# Patient Record
Sex: Female | Born: 1960 | Hispanic: Yes | Marital: Single | Smoking: Former smoker
Health system: Southern US, Community
[De-identification: ages and names within clinical notes are randomized; demographics above are authoritative.]

## PROBLEM LIST (undated history)

## (undated) DIAGNOSIS — I639 Cerebral infarction, unspecified: Secondary | ICD-10-CM

## (undated) DIAGNOSIS — J45909 Unspecified asthma, uncomplicated: Secondary | ICD-10-CM

## (undated) DIAGNOSIS — E119 Type 2 diabetes mellitus without complications: Secondary | ICD-10-CM

## (undated) DIAGNOSIS — F0390 Unspecified dementia without behavioral disturbance: Secondary | ICD-10-CM

## (undated) HISTORY — PX: NO PAST SURGERIES: SHX2092

---

## 2017-09-01 ENCOUNTER — Emergency Department (HOSPITAL_COMMUNITY): Payer: Medicare (Managed Care)

## 2017-09-01 ENCOUNTER — Inpatient Hospital Stay (HOSPITAL_COMMUNITY)
Admission: EM | Admit: 2017-09-01 | Discharge: 2017-09-02 | DRG: 313 | Disposition: A | Discharge status: Home / Self Care | Payer: Medicare (Managed Care) | Attending: Cardiovascular Disease | Admitting: Cardiovascular Disease

## 2017-09-01 ENCOUNTER — Encounter (HOSPITAL_COMMUNITY): Payer: Self-pay | Admitting: Emergency Medicine

## 2017-09-01 DIAGNOSIS — I69354 Hemiplegia and hemiparesis following cerebral infarction affecting left non-dominant side: Secondary | ICD-10-CM | POA: Diagnosis not present

## 2017-09-01 DIAGNOSIS — Z794 Long term (current) use of insulin: Secondary | ICD-10-CM | POA: Diagnosis not present

## 2017-09-01 DIAGNOSIS — Z23 Encounter for immunization: Secondary | ICD-10-CM | POA: Diagnosis present

## 2017-09-01 DIAGNOSIS — R072 Precordial pain: Secondary | ICD-10-CM | POA: Diagnosis not present

## 2017-09-01 DIAGNOSIS — E785 Hyperlipidemia, unspecified: Secondary | ICD-10-CM | POA: Diagnosis present

## 2017-09-01 DIAGNOSIS — R7989 Other specified abnormal findings of blood chemistry: Secondary | ICD-10-CM

## 2017-09-01 DIAGNOSIS — J45909 Unspecified asthma, uncomplicated: Secondary | ICD-10-CM | POA: Diagnosis present

## 2017-09-01 DIAGNOSIS — I10 Essential (primary) hypertension: Secondary | ICD-10-CM | POA: Diagnosis present

## 2017-09-01 DIAGNOSIS — R739 Hyperglycemia, unspecified: Secondary | ICD-10-CM

## 2017-09-01 DIAGNOSIS — R778 Other specified abnormalities of plasma proteins: Secondary | ICD-10-CM

## 2017-09-01 DIAGNOSIS — R079 Chest pain, unspecified: Principal | ICD-10-CM | POA: Diagnosis present

## 2017-09-01 DIAGNOSIS — I214 Non-ST elevation (NSTEMI) myocardial infarction: Secondary | ICD-10-CM | POA: Diagnosis not present

## 2017-09-01 DIAGNOSIS — E7800 Pure hypercholesterolemia, unspecified: Secondary | ICD-10-CM | POA: Diagnosis not present

## 2017-09-01 DIAGNOSIS — E119 Type 2 diabetes mellitus without complications: Secondary | ICD-10-CM

## 2017-09-01 DIAGNOSIS — Z9114 Patient's other noncompliance with medication regimen: Secondary | ICD-10-CM | POA: Diagnosis not present

## 2017-09-01 DIAGNOSIS — Z8673 Personal history of transient ischemic attack (TIA), and cerebral infarction without residual deficits: Secondary | ICD-10-CM

## 2017-09-01 DIAGNOSIS — E118 Type 2 diabetes mellitus with unspecified complications: Secondary | ICD-10-CM

## 2017-09-01 DIAGNOSIS — E781 Pure hyperglyceridemia: Secondary | ICD-10-CM | POA: Diagnosis present

## 2017-09-01 DIAGNOSIS — R0602 Shortness of breath: Secondary | ICD-10-CM | POA: Diagnosis not present

## 2017-09-01 DIAGNOSIS — E1165 Type 2 diabetes mellitus with hyperglycemia: Secondary | ICD-10-CM | POA: Diagnosis present

## 2017-09-01 HISTORY — DX: Type 2 diabetes mellitus without complications: E11.9

## 2017-09-01 HISTORY — DX: Unspecified asthma, uncomplicated: J45.909

## 2017-09-01 HISTORY — DX: Cerebral infarction, unspecified: I63.9

## 2017-09-01 LAB — COMPREHENSIVE METABOLIC PANEL
ALBUMIN: 3.7 g/dL (ref 3.5–5.0)
ALK PHOS: 85 U/L (ref 38–126)
ALT: 27 U/L (ref 14–54)
ANION GAP: 9 (ref 5–15)
AST: 21 U/L (ref 15–41)
BILIRUBIN TOTAL: 0.9 mg/dL (ref 0.3–1.2)
BUN: 10 mg/dL (ref 6–20)
CALCIUM: 9 mg/dL (ref 8.9–10.3)
CO2: 27 mmol/L (ref 22–32)
CREATININE: 0.53 mg/dL (ref 0.44–1.00)
Chloride: 99 mmol/L — ABNORMAL LOW (ref 101–111)
GFR calc Af Amer: >60 mL/min (ref >60)
GFR calc non Af Amer: >60 mL/min (ref >60)
GLUCOSE: 345 mg/dL — AB (ref 65–99)
Potassium: 4.1 mmol/L (ref 3.5–5.1)
SODIUM: 135 mmol/L (ref 135–145)
TOTAL PROTEIN: 7 g/dL (ref 6.5–8.1)

## 2017-09-01 LAB — CBC WITH DIFFERENTIAL/PLATELET
BASOS ABS: 0 10*3/uL (ref 0.0–0.1)
BASOS PCT: 0 %
EOS PCT: 2 %
Eosinophils Absolute: 0.2 10*3/uL (ref 0.0–0.7)
HCT: 43.4 % (ref 36.0–46.0)
Hemoglobin: 14.7 g/dL (ref 12.0–15.0)
Lymphocytes Relative: 31 %
Lymphs Abs: 2.2 10*3/uL (ref 0.7–4.0)
MCH: 31 pg (ref 26.0–34.0)
MCHC: 33.9 g/dL (ref 30.0–36.0)
MCV: 91.6 fL (ref 78.0–100.0)
MONO ABS: 0.5 10*3/uL (ref 0.1–1.0)
MONOS PCT: 7 %
NEUTROS ABS: 4.1 10*3/uL (ref 1.7–7.7)
Neutrophils Relative %: 60 %
PLATELETS: 275 10*3/uL (ref 150–400)
RBC: 4.74 MIL/uL (ref 3.87–5.11)
RDW: 12.6 % (ref 11.5–15.5)
WBC: 7 10*3/uL (ref 4.0–10.5)

## 2017-09-01 LAB — GLUCOSE, CAPILLARY: GLUCOSE-CAPILLARY: 404 mg/dL — AB (ref 65–99)

## 2017-09-01 LAB — TROPONIN I: Troponin I: <0.03 ng/mL (ref <0.03)

## 2017-09-01 MED ORDER — ALBUTEROL SULFATE HFA 108 (90 BASE) MCG/ACT IN AERS: 2.0000 | INHALATION_SPRAY | Freq: Four times a day (QID) | RESPIRATORY_TRACT | Status: DC | PRN

## 2017-09-01 MED ORDER — ACETAMINOPHEN 325 MG PO TABS: 325.0000 mg | ORAL_TABLET | Freq: Four times a day (QID) | ORAL | Status: DC | PRN

## 2017-09-01 MED ORDER — HEPARIN (PORCINE) IN NACL 100-0.45 UNIT/ML-% IJ SOLN
1100.0000 [IU]/h | INTRAMUSCULAR | Status: DC
  Administered 2017-09-01: 800 [IU]/h via INTRAVENOUS
  Filled 2017-09-01: qty 250

## 2017-09-01 MED ORDER — ASPIRIN EC 81 MG PO TBEC
81.0000 mg | DELAYED_RELEASE_TABLET | Freq: Every day | ORAL | Status: DC
  Administered 2017-09-02: 81 mg via ORAL
  Filled 2017-09-01: qty 1

## 2017-09-01 MED ORDER — ALBUTEROL SULFATE (2.5 MG/3ML) 0.083% IN NEBU: 2.5000 mg | INHALATION_SOLUTION | RESPIRATORY_TRACT | Status: DC | PRN

## 2017-09-01 MED ORDER — INSULIN ASPART 100 UNIT/ML ~~LOC~~ SOLN
0.0000 [IU] | Freq: Three times a day (TID) | SUBCUTANEOUS | Status: DC
  Administered 2017-09-02 (×3): 5 [IU] via SUBCUTANEOUS

## 2017-09-01 MED ORDER — INSULIN ASPART 100 UNIT/ML ~~LOC~~ SOLN
0.0000 [IU] | Freq: Every day | SUBCUTANEOUS | Status: DC
  Administered 2017-09-01: 5 [IU] via SUBCUTANEOUS

## 2017-09-01 MED ORDER — ATORVASTATIN CALCIUM 80 MG PO TABS
80.0000 mg | ORAL_TABLET | Freq: Every day | ORAL | Status: DC
  Administered 2017-09-02: 80 mg via ORAL
  Filled 2017-09-01: qty 1

## 2017-09-01 MED ORDER — ASPIRIN 81 MG PO CHEW
324.0000 mg | CHEWABLE_TABLET | Freq: Once | ORAL | Status: AC
  Administered 2017-09-01: 324 mg via ORAL
  Filled 2017-09-01: qty 4

## 2017-09-01 MED ORDER — SODIUM CHLORIDE 0.9 % IV BOLUS (SEPSIS)
1000.0000 mL | Freq: Once | INTRAVENOUS | Status: AC
  Administered 2017-09-01: 1000 mL via INTRAVENOUS

## 2017-09-01 MED ORDER — CITALOPRAM HYDROBROMIDE 20 MG PO TABS
40.0000 mg | ORAL_TABLET | Freq: Every day | ORAL | Status: DC
  Administered 2017-09-01 – 2017-09-02 (×2): 40 mg via ORAL
  Filled 2017-09-01 (×2): qty 2

## 2017-09-01 MED ORDER — IRBESARTAN 150 MG PO TABS
150.0000 mg | ORAL_TABLET | Freq: Every day | ORAL | Status: DC
  Administered 2017-09-02: 150 mg via ORAL
  Filled 2017-09-01: qty 1

## 2017-09-01 MED ORDER — VALSARTAN-HYDROCHLOROTHIAZIDE 160-12.5 MG PO TABS: 1.0000 | ORAL_TABLET | Freq: Every day | ORAL | Status: DC

## 2017-09-01 MED ORDER — INSULIN GLARGINE 100 UNIT/ML ~~LOC~~ SOLN
50.0000 [IU] | Freq: Two times a day (BID) | SUBCUTANEOUS | Status: DC
  Administered 2017-09-02 (×2): 50 [IU] via SUBCUTANEOUS
  Filled 2017-09-01 (×4): qty 0.5

## 2017-09-01 MED ORDER — HEPARIN BOLUS VIA INFUSION
4000.0000 [IU] | Freq: Once | INTRAVENOUS | Status: AC
  Administered 2017-09-01: 4000 [IU] via INTRAVENOUS
  Filled 2017-09-01: qty 4000

## 2017-09-01 MED ORDER — ONDANSETRON HCL 4 MG/2ML IJ SOLN: 4.0000 mg | Freq: Four times a day (QID) | INTRAMUSCULAR | Status: DC | PRN

## 2017-09-01 MED ORDER — HYDROCHLOROTHIAZIDE 12.5 MG PO CAPS
12.5000 mg | ORAL_CAPSULE | Freq: Every day | ORAL | Status: DC
  Administered 2017-09-02: 12.5 mg via ORAL
  Filled 2017-09-01: qty 1

## 2017-09-01 MED ORDER — ACETAMINOPHEN 325 MG PO TABS: 650.0000 mg | ORAL_TABLET | ORAL | Status: DC | PRN

## 2017-09-01 MED ORDER — NITROGLYCERIN 0.4 MG SL SUBL: 0.4000 mg | SUBLINGUAL_TABLET | SUBLINGUAL | Status: DC | PRN

## 2017-09-01 MED ORDER — METOPROLOL TARTRATE 12.5 MG HALF TABLET
12.5000 mg | ORAL_TABLET | Freq: Two times a day (BID) | ORAL | Status: DC
  Administered 2017-09-01 – 2017-09-02 (×2): 12.5 mg via ORAL
  Filled 2017-09-01 (×2): qty 1

## 2017-09-01 MED ORDER — INSULIN ASPART 100 UNIT/ML ~~LOC~~ SOLN
8.0000 [IU] | Freq: Once | SUBCUTANEOUS | Status: AC
  Administered 2017-09-01: 8 [IU] via INTRAVENOUS
  Filled 2017-09-01: qty 1

## 2017-09-01 NOTE — H&P (Signed)
Cardiology History & Physical    Patient ID: Christina Evans MRN: [id number], DOB: February 07, 1961 Date of Encounter: 09/01/2017, 8:58 PM Primary Physician: Patient, No Pcp Per  Chief Complaint: Chest pain   HPI: Christina Evans is a 56 y.o. female with history of DM2, HTN, prior stroke, asthma, who presents with CP.  Pt has had intermittent SSCP, described as pressure, over the past 3 days.  This was associated with SOB, and not necessarily related to exertion.  Given persistence and increasing frequency of these CP episodes, she presented to the ED today.    In the ED, initial ECG was unremarkable, but labs showed troponin 0.21. She was Cp free on my interview.  On ROS, she does note an episode of syncope 2-3 weeks prior, but denies any episodes of presyncope or syncope since then.  She denies lower extremity edema, palpitations, or neurologic symptoms.  Past Medical History:  Diagnosis Date  . Asthma   . CVA (cerebral vascular accident) (HCC)   . Diabetes mellitus without complication [facility])      Surgical History: No past surgical history on file.   Home Meds: Prior to Admission medications   Medication Sig Start Date End Date Taking? Authorizing Provider  acetaminophen (TYLENOL) 325 MG tablet Take 325-650 mg by mouth every 6 (six) hours as needed (for headaches or pain).   Yes [provider]  albuterol (PROAIR HFA) 108 (90 Base) MCG/ACT inhaler Inhale 2 puffs into the lungs 4 (four) times daily as needed for wheezing or shortness of breath.    Yes [provider]  citalopram (CELEXA) 40 MG tablet Take 40 mg by mouth daily.   Yes [provider]  insulin aspart (NOVOLOG FLEXPEN) 100 UNIT/ML FlexPen Inject 15 Units into the skin 3 (three) times daily with meals.   Yes [provider]  Insulin Glargine (LANTUS SOLOSTAR) 100 UNIT/ML Solostar Pen Inject 50 Units into the skin 2 (two) times daily.   Yes [provider]  metFORMIN  (GLUCOPHAGE) 500 MG tablet Take 500 mg by mouth 2 (two) times daily with a meal.   Yes [provider]  valsartan-hydrochlorothiazide (DIOVAN-HCT) 160-12.5 MG tablet Take 1 tablet by mouth daily.   Yes [provider]    Allergies: No Known Allergies  Social History   Social History  . Marital status: Single    Spouse name: N/A  . Number of children: N/A  . Years of education: N/A   Occupational History  . Not on file.   Social History Main Topics  . Smoking status: Not on file  . Smokeless tobacco: Not on file  . Alcohol use Not on file  . Drug use: Unknown  . Sexual activity: Not on file   Other Topics Concern  . Not on file   Social History Narrative  . No narrative on file     No family history on file.  Review of Systems: All other systems reviewed and are otherwise negative except as noted above.  Labs:  Lab Results  Component Value Date   WBC 7.0 09/01/2017   HGB 14.7 09/01/2017   HCT 43.4 09/01/2017   MCV 91.6 09/01/2017   PLT 275 09/01/2017    Recent Labs Lab 09/01/17 1149  NA 135  K 4.1  CL 99*  CO2 27  BUN 10  CREATININE 0.53  CALCIUM 9.0  PROT 7.0  BILITOT 0.9  ALKPHOS 85  ALT 27  AST 21  GLUCOSE 345*  Recent Labs  09/01/17 1810  TROPONINI 0.21*   No results found for: CHOL, HDL, LDLCALC, TRIG No results found for: DDIMER  Radiology/Studies:  Dg Chest 2 View  Result Date: 09/01/2017 CLINICAL DATA:  Chest pain. EXAM: CHEST  2 VIEW COMPARISON:  None. FINDINGS: The heart size and mediastinal contours are within normal limits. Both lungs are clear. The visualized skeletal structures are unremarkable. IMPRESSION: Negative two view chest x-ray Electronically Signed   By: Marin Roberts M.D.   On: 09/01/2017 16:17   Wt Readings from Last 3 Encounters:  No data found for Wt    EKG: NSR, NS STTW changes.  Physical Exam: Blood pressure 110/76, pulse 84, temperature 99.1 F (37.3 C), temperature source  Oral, resp. rate 16, SpO2 100 %. There is no height or weight on file to calculate BMI. General: Well developed, well nourished, in no acute distress. Head: Normocephalic, atraumatic, sclera non-icteric, no xanthomas, nares are without discharge.  Neck: Negative for carotid bruits. JVD not elevated. Lungs: Clear bilaterally to auscultation without wheezes, rales, or rhonchi. Breathing is unlabored. Heart: RRR with S1 S2. No murmurs, rubs, or gallops appreciated. Abdomen: Soft, non-tender, non-distended with normoactive bowel sounds. No hepatomegaly. No rebound/guarding. No obvious abdominal masses. Msk:  Strength and tone appear normal for age. Extremities: No clubbing or cyanosis. No edema.  Distal pedal pulses are 2+ and equal bilaterally. Neuro: Alert and oriented X 3. No focal deficit. No facial asymmetry. Moves all extremities spontaneously. Psych:  Responds to questions appropriately with a normal affect.    Assessment and Plan  56 y.o. female with history of DM2, HTN, prior stroke, asthma, who presents with CP, found to have NSTEMI.  1. NSTEMI:  Concerning for plaque rupture event. Will start heparin gtt and keep NPO in anticipation of cath tomorrow.  Will continue low dose ASA, start low dose metoprolol and high intensity atorvastatin.  TTE ordered.  2.  HTN:  Continue home HCTZ-Valsartan, added low dose metoprolol as aforementioned.  3.  DM2:  Hyperglycemic on presentation.  Continue home Lantus and AISS.  Holding metformin in anticipation of contrast load.  4.  Asthma: Continue PRN albuterol.  No evidence of bronchospasm on exam.  Signed, Esmond Plants, MD 09/01/2017, 8:58 PM

## 2017-09-01 NOTE — ED Triage Notes (Signed)
The pt just returned from xray 

## 2017-09-01 NOTE — ED Notes (Signed)
Pt went to x-ray.

## 2017-09-01 NOTE — Progress Notes (Signed)
ANTICOAGULATION CONSULT NOTE - Initial Consult  Pharmacy Consult for heparin dosing Indication: chest pain/ACS  No Known Allergies  Patient Measurements:   Heparin Dosing Weight: 64.84  Vital Signs: Temp: 99.1 F (37.3 C) (10/10 1146) Temp Source: Oral (10/10 1146) BP: 110/76 (10/10 2000) Pulse Rate: 84 (10/10 2000)  Labs:  Recent Labs  09/01/17 1149 09/01/17 1810  HGB 14.7  --   HCT 43.4  --   PLT 275  --   CREATININE 0.53  --   TROPONINI  --  0.21*    CrCl cannot be calculated (Unknown ideal weight.).   Medical History: Past Medical History:  Diagnosis Date  . Asthma   . CVA (cerebral vascular accident) (HCC)   . Diabetes mellitus without complication (HCC)     Assessment: 55 YOF admitted with chest pain. HGB 14.7, Hct 43.4, Plt 275.  Troponin 0.21. History of stroke.  No anticoag PTA. No bleeding reported.   Goal of Therapy:  Heparin level 0.3-0.7 units/ml Monitor platelets by anticoagulation protocol: Yes   Plan:  Give 4000 unit IV bolus x 1 Start heparin infusion at 800 units/hr. 6 hour heparin level, daily heparin level and CBC Monitor s/sx of bleeding.   Mariam Helbert 09/01/2017,9:17 PM

## 2017-09-01 NOTE — ED Notes (Signed)
No pain c/o sob  None at present  sats 100 % on room air

## 2017-09-01 NOTE — ED Triage Notes (Signed)
Pt states she woke up this am with left sided facial swelling in her cheek, pt denies any pain, pt has dentures and no teeth or carries. Pt has history of stroke with left side paralysis.

## 2017-09-01 NOTE — ED Provider Notes (Addendum)
[facility]-EMERGENCY DEPT Provider Note   CSN: [id number] Arrival date & time: 09/01/17  1120     History   Chief Complaint Chief Complaint  Patient presents with  . Chest Pain    HPI Christina Evans is a 56 y.o. female.  Patient with hx prior cva 2008 with residual left sided weakness, c/o mid chest pain and mild sob for the past 3 days. Pt is difficult historian - she indicates since she had a prior stroke she doesn't want to get a new stroke.  Pt denies any new numbness/weakness. No change in vision or speech. No acute change or new loss in any functional ability. Cp occurs at rest. Constant, dull. Mild, non radiating. Occurs at rest. No relation to exertion. It is not pleuritic. Denies chest wall strain. No heartburn. Denies leg pain or swelling. No recent surgery, immobility, trauma or travel. Denies cough or uri c/o. No fever or chills.    The history is provided by the patient.    Past Medical History:  Diagnosis Date  . Asthma   . CVA (cerebral vascular accident) (HCC)   . Diabetes mellitus without complication (HCC)     There are no active problems to display for this patient.   No past surgical history on file.  OB History    No data available       Home Medications    Prior to Admission medications   Not on File    Family History No family history on file.  Social History Social History  Substance Use Topics  . Smoking status: Not on file  . Smokeless tobacco: Not on file  . Alcohol use Not on file     Allergies   Patient has no known allergies.   Review of Systems Review of Systems  Constitutional: Negative for chills and fever.  HENT: Negative for sore throat.   Eyes: Negative for redness.  Respiratory: Positive for shortness of breath.   Cardiovascular: Positive for chest pain.  Gastrointestinal: Negative for abdominal pain.  Genitourinary: Negative for flank pain.  Musculoskeletal: Negative for back pain and neck pain.  Skin:  Negative for rash.  Neurological: Negative for weakness, numbness and headaches.  Hematological: Does not bruise/bleed easily.  Psychiatric/Behavioral: Negative for confusion.     Physical Exam Updated Vital Signs BP 123/68 (BP Location: Right Arm)   Pulse 94   Temp 99.1 F (37.3 C) (Oral)   Resp 16   SpO2 96%   Physical Exam  Constitutional: She is oriented to person, place, and time. She appears well-developed and well-nourished. No distress.  HENT:  Head: Atraumatic.  Mouth/Throat: Oropharynx is clear and moist.  Eyes: Pupils are equal, round, and reactive to light. Conjunctivae and EOM are normal. No scleral icterus.  Neck: Neck supple. No tracheal deviation present. No thyromegaly present.  No bruits  Cardiovascular: Normal rate, regular rhythm, normal heart sounds and intact distal pulses.  Exam reveals no gallop and no friction rub.   No murmur heard. Pulmonary/Chest: Effort normal and breath sounds normal. No respiratory distress. She exhibits no tenderness.  Abdominal: Soft. Normal appearance and bowel sounds are normal. She exhibits no distension. There is no tenderness.  Musculoskeletal: She exhibits no edema or tenderness.  Neurological: She is alert and oriented to person, place, and time.  No facial droop. Cr n intact. No pronator drift on right.  L hemiparesis (c/w baseline per pt). Right motor 5/5.   Skin: Skin is warm and dry. No rash noted.  She is not diaphoretic.  Psychiatric: She has a normal mood and affect.  Nursing note and vitals reviewed.    ED Treatments / Results  Labs (all labs ordered are listed, but only abnormal results are displayed) Results for orders placed or performed during the hospital encounter of 09/01/17  Comprehensive metabolic panel  Result Value Ref Range   Sodium 135 135 - 145 mmol/L   Potassium 4.1 3.5 - 5.1 mmol/L   Chloride 99 (L) 101 - 111 mmol/L   CO2 27 22 - 32 mmol/L   Glucose, Bld 345 (H) 65 - 99 mg/dL   BUN 10 6 -  20 mg/dL   Creatinine, Ser 1.61 0.44 - 1.00 mg/dL   Calcium 9.0 8.9 - 09.6 mg/dL   Total Protein 7.0 6.5 - 8.1 g/dL   Albumin 3.7 3.5 - 5.0 g/dL   AST 21 15 - 41 U/L   ALT 27 14 - 54 U/L   Alkaline Phosphatase 85 38 - 126 U/L   Total Bilirubin 0.9 0.3 - 1.2 mg/dL   GFR calc non Af Amer >60 >60 mL/min   GFR calc Af Amer >60 >60 mL/min   Anion gap 9 5 - 15  CBC with Differential  Result Value Ref Range   WBC 7.0 4.0 - 10.5 K/uL   RBC 4.74 3.87 - 5.11 MIL/uL   Hemoglobin 14.7 12.0 - 15.0 g/dL   HCT 04.5 40.9 - 81.1 %   MCV 91.6 78.0 - 100.0 fL   MCH 31.0 26.0 - 34.0 pg   MCHC 33.9 30.0 - 36.0 g/dL   RDW 91.4 78.2 - 95.6 %   Platelets 275 150 - 400 K/uL   Neutrophils Relative % 60 %   Neutro Abs 4.1 1.7 - 7.7 K/uL   Lymphocytes Relative 31 %   Lymphs Abs 2.2 0.7 - 4.0 K/uL   Monocytes Relative 7 %   Monocytes Absolute 0.5 0.1 - 1.0 K/uL   Eosinophils Relative 2 %   Eosinophils Absolute 0.2 0.0 - 0.7 K/uL   Basophils Relative 0 %   Basophils Absolute 0.0 0.0 - 0.1 K/uL  Troponin I  Result Value Ref Range   Troponin I 0.21 (HH) <0.03 ng/mL   EKG  EKG Interpretation None       Radiology Dg Chest 2 View  Result Date: 09/01/2017 CLINICAL DATA:  Chest pain. EXAM: CHEST  2 VIEW COMPARISON:  None. FINDINGS: The heart size and mediastinal contours are within normal limits. Both lungs are clear. The visualized skeletal structures are unremarkable. IMPRESSION: Negative two view chest x-ray Electronically Signed   By: Marin Roberts M.D.   On: 09/01/2017 16:17    Procedures Procedures (including critical care time)  Medications Ordered in ED Medications - No data to display   Initial Impression / Assessment and Plan / ED Course  I have reviewed the triage vital signs and the nursing notes.  Pertinent labs & imaging results that were available during my care of the patient were reviewed by me and considered in my medical decision making (see chart for details).  Iv  ns bolus. Labs.   Reviewed nursing notes and prior charts for additional history.   Glucose is elevated. Pt initially denies hx diab, but chart/meds indicate otherwise.  Iv ns bolus. novolog sq.  Awaiting lab results.  Trop returns high.  No current chest pain. Asa po.  Cardiology consulted for admission re recent cp/sob, and elevated trop.  Pt also w hyperglycemia.  Discussed w  cardiology - they will admit.    Final Clinical Impressions(s) / ED Diagnoses   Final diagnoses:  None    New Prescriptions New Prescriptions   No medications on file          Cathren Laine, MD 09/01/17 2020

## 2017-09-01 NOTE — ED Notes (Signed)
I went in to start an iv on this pt  She does not want the iv until she knows why she ios receiving it.  Dr Denton Lank made aware

## 2017-09-02 ENCOUNTER — Inpatient Hospital Stay (HOSPITAL_COMMUNITY): Payer: Medicare (Managed Care)

## 2017-09-02 ENCOUNTER — Encounter (HOSPITAL_COMMUNITY): Payer: Self-pay

## 2017-09-02 DIAGNOSIS — R072 Precordial pain: Secondary | ICD-10-CM

## 2017-09-02 DIAGNOSIS — E7800 Pure hypercholesterolemia, unspecified: Secondary | ICD-10-CM

## 2017-09-02 DIAGNOSIS — I10 Essential (primary) hypertension: Secondary | ICD-10-CM

## 2017-09-02 DIAGNOSIS — R0602 Shortness of breath: Secondary | ICD-10-CM

## 2017-09-02 DIAGNOSIS — R079 Chest pain, unspecified: Secondary | ICD-10-CM

## 2017-09-02 DIAGNOSIS — E785 Hyperlipidemia, unspecified: Secondary | ICD-10-CM

## 2017-09-02 DIAGNOSIS — E119 Type 2 diabetes mellitus without complications: Secondary | ICD-10-CM

## 2017-09-02 DIAGNOSIS — I214 Non-ST elevation (NSTEMI) myocardial infarction: Secondary | ICD-10-CM

## 2017-09-02 DIAGNOSIS — E118 Type 2 diabetes mellitus with unspecified complications: Secondary | ICD-10-CM

## 2017-09-02 DIAGNOSIS — Z794 Long term (current) use of insulin: Secondary | ICD-10-CM

## 2017-09-02 LAB — GLUCOSE, CAPILLARY
Glucose-Capillary: 228 mg/dL — ABNORMAL HIGH (ref 65–99)
Glucose-Capillary: 258 mg/dL — ABNORMAL HIGH (ref 65–99)
Glucose-Capillary: 298 mg/dL — ABNORMAL HIGH (ref 65–99)
Glucose-Capillary: 360 mg/dL — ABNORMAL HIGH (ref 65–99)
Glucose-Capillary: 263 mg/dL — ABNORMAL HIGH (ref 65–99)

## 2017-09-02 LAB — LIPID PANEL
Cholesterol: 262 mg/dL — ABNORMAL HIGH (ref 0–200)
HDL: 36 mg/dL — ABNORMAL LOW (ref >40)
LDL Cholesterol: UNDETERMINED mg/dL (ref 0–99)
Total CHOL/HDL Ratio: 7.3 RATIO
Triglycerides: 727 mg/dL — ABNORMAL HIGH (ref <150)
VLDL: UNDETERMINED mg/dL (ref 0–40)

## 2017-09-02 LAB — HIV ANTIBODY (ROUTINE TESTING W REFLEX): HIV Screen 4th Generation wRfx: NONREACTIVE

## 2017-09-02 LAB — BASIC METABOLIC PANEL
Anion gap: 10 (ref 5–15)
BUN: 10 mg/dL (ref 6–20)
CO2: 23 mmol/L (ref 22–32)
Calcium: 8.7 mg/dL — ABNORMAL LOW (ref 8.9–10.3)
Chloride: 101 mmol/L (ref 101–111)
Creatinine, Ser: 0.51 mg/dL (ref 0.44–1.00)
GFR calc Af Amer: >60 mL/min (ref >60)
GFR calc non Af Amer: >60 mL/min (ref >60)
Glucose, Bld: 223 mg/dL — ABNORMAL HIGH (ref 65–99)
Potassium: 3.5 mmol/L (ref 3.5–5.1)
Sodium: 134 mmol/L — ABNORMAL LOW (ref 135–145)

## 2017-09-02 LAB — NM MYOCAR MULTI W/SPECT W/WALL MOTION / EF
CSEPEDS: 23 s
CSEPEW: 1 METS
CSEPPHR: 96 {beats}/min
Exercise duration (min): 5 min
Rest HR: 78 {beats}/min

## 2017-09-02 LAB — CBC
HCT: 41.8 % (ref 36.0–46.0)
HEMATOCRIT: 43.3 % (ref 36.0–46.0)
Hemoglobin: 14.4 g/dL (ref 12.0–15.0)
Hemoglobin: 14.5 g/dL (ref 12.0–15.0)
MCH: 31.6 pg (ref 26.0–34.0)
MCH: 30.9 pg (ref 26.0–34.0)
MCHC: 34.4 g/dL (ref 30.0–36.0)
MCHC: 33.5 g/dL (ref 30.0–36.0)
MCV: 91.7 fL (ref 78.0–100.0)
MCV: 92.3 fL (ref 78.0–100.0)
Platelets: 277 10*3/uL (ref 150–400)
Platelets: 312 10*3/uL (ref 150–400)
RBC: 4.56 MIL/uL (ref 3.87–5.11)
RBC: 4.69 MIL/uL (ref 3.87–5.11)
RDW: 12.5 % (ref 11.5–15.5)
RDW: 12.7 % (ref 11.5–15.5)
WBC: 8.8 10*3/uL (ref 4.0–10.5)
WBC: 8 10*3/uL (ref 4.0–10.5)

## 2017-09-02 LAB — MRSA PCR SCREENING: MRSA BY PCR: NEGATIVE

## 2017-09-02 LAB — TROPONIN I: Troponin I: <0.03 ng/mL (ref <0.03)

## 2017-09-02 LAB — HEPARIN LEVEL (UNFRACTIONATED): Heparin Unfractionated: <0.1 IU/mL — ABNORMAL LOW (ref 0.30–0.70)

## 2017-09-02 LAB — HEMOGLOBIN A1C
HEMOGLOBIN A1C: 12.7 % — AB (ref 4.8–5.6)
Mean Plasma Glucose: 317.79 mg/dL

## 2017-09-02 MED ORDER — ATORVASTATIN CALCIUM 80 MG PO TABS: 80.0000 mg | ORAL_TABLET | Freq: Every day | ORAL | 0 refills | Status: DC

## 2017-09-02 MED ORDER — BLOOD GLUCOSE METER KIT: PACK | 0 refills | Status: AC

## 2017-09-02 MED ORDER — OMEGA-3-ACID ETHYL ESTERS 1 G PO CAPS
1.0000 g | ORAL_CAPSULE | Freq: Two times a day (BID) | ORAL | Status: DC
  Administered 2017-09-02: 1 g via ORAL
  Filled 2017-09-02: qty 1

## 2017-09-02 MED ORDER — VALSARTAN-HYDROCHLOROTHIAZIDE 160-12.5 MG PO TABS: 1.0000 | ORAL_TABLET | Freq: Every day | ORAL | 1 refills | Status: DC

## 2017-09-02 MED ORDER — TECHNETIUM TC 99M TETROFOSMIN IV KIT
30.0000 | PACK | Freq: Once | INTRAVENOUS | Status: AC | PRN
  Administered 2017-09-02: 30 via INTRAVENOUS

## 2017-09-02 MED ORDER — ENOXAPARIN SODIUM 40 MG/0.4ML ~~LOC~~ SOLN
40.0000 mg | SUBCUTANEOUS | Status: DC
  Filled 2017-09-02: qty 0.4

## 2017-09-02 MED ORDER — ENOXAPARIN SODIUM 40 MG/0.4ML ~~LOC~~ SOLN
40.0000 mg | SUBCUTANEOUS | Status: DC
  Administered 2017-09-02: 40 mg via SUBCUTANEOUS

## 2017-09-02 MED ORDER — OMEGA-3-ACID ETHYL ESTERS 1 G PO CAPS: 1.0000 g | ORAL_CAPSULE | Freq: Two times a day (BID) | ORAL | 0 refills | Status: DC

## 2017-09-02 MED ORDER — INFLUENZA VAC SPLIT QUAD 0.5 ML IM SUSY
0.5000 mL | PREFILLED_SYRINGE | INTRAMUSCULAR | Status: AC
  Administered 2017-09-02: 0.5 mL via INTRAMUSCULAR
  Filled 2017-09-02: qty 0.5

## 2017-09-02 MED ORDER — LIVING WELL WITH DIABETES BOOK
Freq: Once | Status: AC
  Administered 2017-09-02: 17:00:00
  Filled 2017-09-02: qty 1

## 2017-09-02 MED ORDER — METFORMIN HCL 500 MG PO TABS: 500.0000 mg | ORAL_TABLET | Freq: Two times a day (BID) | ORAL | 0 refills | Status: DC

## 2017-09-02 MED ORDER — HEPARIN BOLUS VIA INFUSION
3000.0000 [IU] | Freq: Once | INTRAVENOUS | Status: AC
  Administered 2017-09-02: 3000 [IU] via INTRAVENOUS
  Filled 2017-09-02: qty 3000

## 2017-09-02 MED ORDER — METOPROLOL TARTRATE 25 MG PO TABS: 12.5000 mg | ORAL_TABLET | Freq: Two times a day (BID) | ORAL | 0 refills | Status: DC

## 2017-09-02 MED ORDER — REGADENOSON 0.4 MG/5ML IV SOLN
INTRAVENOUS | Status: AC
  Filled 2017-09-02: qty 5

## 2017-09-02 MED ORDER — TECHNETIUM TC 99M TETROFOSMIN IV KIT
10.0000 | PACK | Freq: Once | INTRAVENOUS | Status: AC | PRN
  Administered 2017-09-02: 10 via INTRAVENOUS

## 2017-09-02 MED ORDER — INSULIN STARTER KIT- SYRINGES (ENGLISH)
1.0000 | Freq: Once | Status: DC
  Filled 2017-09-02: qty 1

## 2017-09-02 MED ORDER — REGADENOSON 0.4 MG/5ML IV SOLN
0.4000 mg | Freq: Once | INTRAVENOUS | Status: AC
  Administered 2017-09-02: 0.4 mg via INTRAVENOUS
  Filled 2017-09-02: qty 5

## 2017-09-02 MED ORDER — PNEUMOCOCCAL VAC POLYVALENT 25 MCG/0.5ML IJ INJ
0.5000 mL | INJECTION | INTRAMUSCULAR | Status: AC
Start: 2017-09-03 — End: 2017-09-02
  Administered 2017-09-02: 0.5 mL via INTRAMUSCULAR
  Filled 2017-09-02: qty 0.5

## 2017-09-02 NOTE — Progress Notes (Signed)
Progress Note  Patient Name: Christina Evans Date of Encounter: 09/02/2017  Primary Cardiologist: New to Dr. Duke Salvia  Subjective   No recurrent chest pain or shortness of breath while here.   Inpatient Medications    Scheduled Meds: . aspirin EC  81 mg Oral Daily  . atorvastatin  80 mg Oral q1800  . citalopram  40 mg Oral Daily  . hydrochlorothiazide  12.5 mg Oral Daily  . [START ON 09/03/2017] Influenza vac split quadrivalent PF  0.5 mL Intramuscular Tomorrow-1000  . insulin aspart  0-5 Units Subcutaneous QHS  . insulin aspart  0-9 Units Subcutaneous TID WC  . insulin glargine  50 Units Subcutaneous BID  . irbesartan  150 mg Oral Daily  . metoprolol tartrate  12.5 mg Oral BID  . [START ON 09/03/2017] pneumococcal 23 valent vaccine  0.5 mL Intramuscular Tomorrow-1000   Continuous Infusions: . heparin 1,100 Units/hr (09/02/17 0612)   PRN Meds: acetaminophen, albuterol, nitroGLYCERIN, ondansetron (ZOFRAN) IV   Vital Signs    Vitals:   09/01/17 2230 09/01/17 2309 09/01/17 2342 09/02/17 0421  BP:   132/66 122/73  Pulse: 96  98   Resp:   12 17  Temp:   98.5 F (36.9 C) 98.8 F (37.1 C)  TempSrc:   Oral Oral  SpO2: 97%  98% 98%  Weight:  171 lb (77.6 kg)  169 lb 14.4 oz (77.1 kg)  Height:  5' (1.524 m)      Intake/Output Summary (Last 24 hours) at 09/02/17 0739 Last data filed at 09/02/17 0400  Gross per 24 hour  Intake           1046.8 ml  Output                0 ml  Net           1046.8 ml   Filed Weights   09/01/17 2100 09/01/17 2309 09/02/17 0421  Weight: 169 lb (76.7 kg) 171 lb (77.6 kg) 169 lb 14.4 oz (77.1 kg)    Telemetry    Sinus rhythm - Personally Reviewed  ECG    N/A - Personally Reviewed  Physical Exam   GEN: No acute distress.   Neck: No JVD Cardiac: RRR, no murmurs, rubs, or gallops.  Respiratory: Clear to auscultation bilaterally. GI: Soft, nontender, non-distended  MS: No edema; No deformity. Neuro:  Left sided  weakness Psych: Normal affect   Labs    Chemistry Recent Labs Lab 09/01/17 1149 09/02/17 0427  NA 135 134*  K 4.1 3.5  CL 99* 101  CO2 27 23  GLUCOSE 345* 223*  BUN 10 10  CREATININE 0.53 0.51  CALCIUM 9.0 8.7*  PROT 7.0  --   ALBUMIN 3.7  --   AST 21  --   ALT 27  --   ALKPHOS 85  --   BILITOT 0.9  --   GFRNONAA >60 >60  GFRAA >60 >60  ANIONGAP 9 10     Hematology Recent Labs Lab 09/01/17 1149 09/02/17 0427  WBC 7.0 8.8  RBC 4.74 4.56  HGB 14.7 14.4  HCT 43.4 41.8  MCV 91.6 91.7  MCH 31.0 31.6  MCHC 33.9 34.4  RDW 12.6 12.5  PLT 275 277    Cardiac Enzymes Recent Labs Lab 09/01/17 1810 09/01/17 2123 09/02/17 0427  TROPONINI <0.03 <0.03 <0.03    Radiology    Dg Chest 2 View  Result Date: 09/01/2017 CLINICAL DATA:  Chest pain. EXAM: CHEST  2  VIEW COMPARISON:  None. FINDINGS: The heart size and mediastinal contours are within normal limits. Both lungs are clear. The visualized skeletal structures are unremarkable. IMPRESSION: Negative two view chest x-ray Electronically Signed   By: Marin Roberts M.D.   On: 09/01/2017 16:17    Cardiac Studies   None so far  Patient Profile     56 y.o. female with hx of HTN, [place], DM and CVA in 2008 with residual L sided weakness presented with L sided facial swelling/numbness as well as chest pain and shortness of breath x 3 days.  She is out of her all medication for while since moved to [place] from [place].   Assessment & Plan    1. Chest pain - She is admitted as NSTEMI however later first troponin corrected as false positive. Cardiac enzyme remained negative. No recurrent chest pain and shortness of breath. Says her symptoms only occurs when "angery". Limited activity. Seems poor historian. Cardiac risk factors includes HTN, DM, HLD and stroke.  - Stop heparin. Will review further plan with MD. Keep NPO. Continue current medications for now.  2. HTN - He was non compliant with medications.  Continue current medications.  3. DM - Blood sugar running high. Check HgbA1c. SSI while here. Will need Care manager help to establish primary care.   4. HLD with hypertriglyceridemia - 09/02/2017: Cholesterol 262; HDL 36; LDL Cholesterol UNABLE TO CALCULATE IF TRIGLYCERIDE OVER 400 mg/dL; Triglycerides 727; VLDL UNABLE TO CALCULATE IF TRIGLYCERIDE OVER 400 mg/dL  - Likely due to uncontrolled DM. Continue Lipitor . Consider adding lovaza.  5. Hx of CVA in 2008   For questions or updates, please contact [facility] Please consult [email or web address] for contact info under Cardiology/STEMI.      Signed, Manson Passey, PA  09/02/2017, 7:39 AM

## 2017-09-02 NOTE — Progress Notes (Addendum)
Patient complained of vaginal bleeding, she states she has not had menstrual period in 27 years.  Dr Duke Salvia notified. Order received for CBC.

## 2017-09-02 NOTE — Progress Notes (Signed)
Lab notified this RN that initial troponin was confirmed a false positive after second trop resulted negative. Pt has been chest pain free since arrival on unit.

## 2017-09-02 NOTE — Progress Notes (Signed)
ANTICOAGULATION CONSULT NOTE - Follow Up Consult  Pharmacy Consult for heparin Indication: NSTEMI  Labs:  Recent Labs  09/01/17 1149 09/01/17 1810 09/01/17 2123 09/02/17 0427  HGB 14.7  --   --  14.4  HCT 43.4  --   --  41.8  PLT 275  --   --  277  HEPARINUNFRC  --   --   --  <0.10*  CREATININE 0.53  --   --  0.51  TROPONINI  --  <0.03 <0.03 <0.03    Assessment: 56yo female undetectable on heparin with initial dosing for NSTEMI.  Goal of Therapy:  Heparin level 0.3-0.7 units/ml   Plan:  Will rebolus with heparin 3000 units and increase gtt by 4 units/kg/hr to 1100 units/hr and check level in 6hr.  Vernard Gambles, PharmD, BCPS  09/02/2017,6:12 AM

## 2017-09-02 NOTE — Discharge Instructions (Signed)
Diabetes Mellitus and Food It is important for you to manage your blood sugar (glucose) level. Your blood glucose level can be greatly affected by what you eat. Eating healthier foods in the appropriate amounts throughout the day at about the same time each day will help you control your blood glucose level. It can also help slow or prevent worsening of your diabetes mellitus. Healthy eating may even help you improve the level of your blood pressure and reach or maintain a healthy weight. General recommendations for healthful eating and cooking habits include:  Eating meals and snacks regularly. Avoid going long periods of time without eating to lose weight.  Eating a diet that consists mainly of plant-based foods, such as fruits, vegetables, nuts, legumes, and whole grains.  Using low-heat cooking methods, such as baking, instead of high-heat cooking methods, such as deep frying.  Work with your dietitian to make sure you understand how to use the Nutrition Facts information on food labels. How can food affect me? Carbohydrates Carbohydrates affect your blood glucose level more than any other type of food. Your dietitian will help you determine how many carbohydrates to eat at each meal and teach you how to count carbohydrates. Counting carbohydrates is important to keep your blood glucose at a healthy level, especially if you are using insulin or taking certain medicines for diabetes mellitus. Alcohol Alcohol can cause sudden decreases in blood glucose (hypoglycemia), especially if you use insulin or take certain medicines for diabetes mellitus. Hypoglycemia can be a life-threatening condition. Symptoms of hypoglycemia (sleepiness, dizziness, and disorientation) are similar to symptoms of having too much alcohol. If your health care provider has given you approval to drink alcohol, do so in moderation and use the following guidelines:  Women should not have more than one drink per day, and men  should not have more than two drinks per day. One drink is equal to: ? 12 oz of beer. ? 5 oz of wine. ? 1 oz of hard liquor.  Do not drink on an empty stomach.  Keep yourself hydrated. Have water, diet soda, or unsweetened iced tea.  Regular soda, juice, and other mixers might contain a lot of carbohydrates and should be counted.  What foods are not recommended? As you make food choices, it is important to remember that all foods are not the same. Some foods have fewer nutrients per serving than other foods, even though they might have the same number of calories or carbohydrates. It is difficult to get your body what it needs when you eat foods with fewer nutrients. Examples of foods that you should avoid that are high in calories and carbohydrates but low in nutrients include:  Trans fats (most processed foods list trans fats on the Nutrition Facts label).  Regular soda.  Juice.  Candy.  Sweets, such as cake, pie, doughnuts, and cookies.  Fried foods.  What foods can I eat? Eat nutrient-rich foods, which will nourish your body and keep you healthy. The food you should eat also will depend on several factors, including:  The calories you need.  The medicines you take.  Your weight.  Your blood glucose level.  Your blood pressure level.  Your cholesterol level.  You should eat a variety of foods, including:  Protein. ? Lean cuts of meat. ? Proteins low in saturated fats, such as fish, egg whites, and beans. Avoid processed meats.  Fruits and vegetables. ? Fruits and vegetables that may help control blood glucose levels, such as apples,   mangoes, and yams.  Dairy products. ? Choose fat-free or low-fat dairy products, such as milk, yogurt, and cheese.  Grains, bread, pasta, and rice. ? Choose whole grain products, such as multigrain bread, whole oats, and brown rice. These foods may help control blood pressure.  Fats. ? Foods containing healthful fats, such as  nuts, avocado, olive oil, canola oil, and fish.  Does everyone with diabetes mellitus have the same meal plan? Because every person with diabetes mellitus is different, there is not one meal plan that works for everyone. It is very important that you meet with a dietitian who will help you create a meal plan that is just right for you. This information is not intended to replace advice given to you by your health care provider. Make sure you discuss any questions you have with your health care provider. Document Released: 08/06/2005 Document Revised: 04/16/2016 Document Reviewed: 10/06/2013 [employer] Interactive Patient Education  2017 [employer]  

## 2017-09-02 NOTE — Progress Notes (Signed)
Inpatient Diabetes Program Recommendations  AACE/ADA: New Consensus Statement on Inpatient Glycemic Control (2015)  Target Ranges:  Prepandial:   less than 140 mg/dL      Peak postprandial:   less than 180 mg/dL (1-2 hours)      Critically ill patients:  140 - 180 mg/dL   Lab Results  Component Value Date   GLUCAP 298 (H) 09/02/2017   HGBA1C 12.7 (H) 09/02/2017    Review of Glycemic Control  Inpatient Diabetes Program Recommendations:   Spoke with pt about A1C results with them and explained what an A1C is, basic pathophysiology of DM Type 2, basic home care, basic diabetes diet nutrition principles, importance of checking CBGs and maintaining good CBG control to prevent long-term and short-term complications. Reviewed signs and symptoms of hyperglycemia and hypoglycemia and how to treat hypoglycemia at home. Also reviewed blood sugar goals at home.  RNs to provide ongoing basic DM education at bedside with this patient. Have ordered educational booklet and DM videos. Placed case management referral regarding medications. Patient was on Medicaid in [place]. Reviewed with patient option of Novolin 70/30 mix from [place]. Patient states she currently does not have finances to afford buying medications and hopes to get Medicaid in . Also needs a glucose meter and strips.Shared with patient [employer] meter option for approx. $18 (meter + 50 strips).  Will follow during hospitalization.  Thank you, Billy Fischer. Maxine Fredman, RN, MSN, CDE  Diabetes Coordinator Inpatient Glycemic Control Team Team Pager [phone] (8am-5pm) 09/02/2017 2:50 PM

## 2017-09-02 NOTE — Progress Notes (Signed)
Patient presented for Lexiscan. Tolerated procedure well. Pending final stress imaging result.  

## 2017-09-02 NOTE — Discharge Summary (Signed)
Discharge Summary    Patient ID: Christina Evans,  MRN: [id number], DOB/AGE: May 24, 1961 56 y.o.  Admit date: 09/01/2017 Discharge date: 09/02/2017  Primary Care Provider: Patient, No Pcp Per Primary Cardiologist: Oval Linsey  Discharge Diagnoses    Active Problems:   Chest pain   Insulin dependent diabetes mellitus ([place])   Hyperlipidemia   Hypertension   Allergies No Known Allergies  Diagnostic Studies/Procedures    Lexiscan: 09/02/17   There was no ST segment deviation noted during stress.  No T wave inversion was noted during stress.  Defect 1: There is a small defect of mild severity present in the mid anteroseptal location.  Nuclear stress EF: 60%.  This is a low risk study.   Low risk stress nuclear study with a small septal perfusion artifact, otherwise normal perfusion and normal left ventricular regional and global systolic function. _____________   History of Present Illness     Christina Evans is a 56 y.o. female with history of DM2, HTN, prior stroke, asthma, who presented with CP.  Pt has had intermittent SSCP, described as pressure, over the past 3 days prior to admission. This was associated with SOB, and not necessarily related to exertion.  Given persistence and increasing frequency of these CP episodes, she presented to the ED.    In the ED, initial ECG was unremarkable, but labs showed troponin 0.21. She was Cp free on interview.  On ROS, she does note an episode of syncope 2-3 weeks prior, but denied any episodes of presyncope or syncope since then.  She denied lower extremity edema, palpitations, or neurologic symptoms. She was admitted for further work up.   Hospital Course     On further review her troponin was felt to have been a false positive, and the following troponins were negative x3. Her heparin was stopped and she was set up for a lexiscan myoview. She reported being out of medications since moving from [place] and having no PCP.  Her labs showed Cholesterol 262; HDL 36; LDL was unable to be calculated, Triglycerides 727, Hgb A1c 12.7. Lexiscan myoview was negative for ischemia and a low risk study. She was seen by CM and arrangements made to establish with the [facility]. Also seen by the diabetes coordinator and instructed on other options regarding insulin as she has been off her novolog and lantus for awhile due to cost. She was instructed to follow up with the [facility] to obtain her insulin, along with meter, test strips, and syringes if unable to afford at the pharmacy. New Rx was sent for her Diovan, Metoprolol, Lipitor, and Lovaza.   Christina Evans was seen by Dr. Oval Linsey and determined stable for discharge home. Follow up in the office has been arranged. Medications are listed below.   _____________  Discharge Vitals Blood pressure 121/74, pulse 87, temperature 98.1 F (36.7 C), temperature source Oral, resp. rate 17, height 5' (1.524 m), weight 169 lb 14.4 oz (77.1 kg), SpO2 98 %.  Filed Weights   09/01/17 2100 09/01/17 2309 09/02/17 0421  Weight: 169 lb (76.7 kg) 171 lb (77.6 kg) 169 lb 14.4 oz (77.1 kg)    Labs & Radiologic Studies    CBC  Recent Labs  09/01/17 1149 09/02/17 0427 09/02/17 1503  WBC 7.0 8.8 8.0  NEUTROABS 4.1  --   --   HGB 14.7 14.4 14.5  HCT 43.4 41.8 43.3  MCV 91.6 91.7 92.3  PLT 275 277 440   Basic Metabolic Panel  Recent Labs  09/01/17 1149 09/02/17 0427  NA 135 134*  K 4.1 3.5  CL 99* 101  CO2 27 23  GLUCOSE 345* 223*  BUN 10 10  CREATININE 0.53 0.51  CALCIUM 9.0 8.7*   Liver Function Tests  Recent Labs  09/01/17 1149  AST 21  ALT 27  ALKPHOS 85  BILITOT 0.9  PROT 7.0  ALBUMIN 3.7   No results for input(s): LIPASE, AMYLASE in the last 72 hours. Cardiac Enzymes  Recent Labs  09/01/17 1810 09/01/17 2123 09/02/17 0427  TROPONINI <0.03 <0.03 <0.03   BNP Invalid input(s): POCBNP D-Dimer No results for input(s): DDIMER in the last 72  hours. Hemoglobin A1C  Recent Labs  09/02/17 0427  HGBA1C 12.7*   Fasting Lipid Panel  Recent Labs  09/02/17 0427  CHOL 262*  HDL 36*  LDLCALC UNABLE TO CALCULATE IF TRIGLYCERIDE OVER 400 mg/dL  TRIG 727*  CHOLHDL 7.3   Thyroid Function Tests No results for input(s): TSH, T4TOTAL, T3FREE, THYROIDAB in the last 72 hours.  Invalid input(s): FREET3 _____________  Dg Chest 2 View  Result Date: 09/01/2017 CLINICAL DATA:  Chest pain. EXAM: CHEST  2 VIEW COMPARISON:  None. FINDINGS: The heart size and mediastinal contours are within normal limits. Both lungs are clear. The visualized skeletal structures are unremarkable. IMPRESSION: Negative two view chest x-ray Electronically Signed   By: San Morelle M.D.   On: 09/01/2017 16:17   Nm Myocar Multi W/spect W/wall Motion / Ef  Result Date: 09/02/2017  There was no ST segment deviation noted during stress.  No T wave inversion was noted during stress.  Defect 1: There is a small defect of mild severity present in the mid anteroseptal location.  Nuclear stress EF: 60%.  This is a low risk study.  Low risk stress nuclear study with a small septal perfusion artifact, otherwise normal perfusion and normal left ventricular regional and global systolic function.   Disposition   Pt is being discharged home today in good condition.  Follow-up Plans & Appointments    Follow-up Information    [facility]. Call.   Why:  Please call to Schedule a Hospital Follow Up-Pt will be able to use the [facility] for one free fill for medications. Medications range from $4.00-$10.00. Pt to ask to be enrolled in the PAS Program.  Contact information: [place] [phone]       Skeet Latch, MD Follow up.   Specialty:  Cardiology Why:  Office will call you with a follow up appt.  Contact information: [address] [place]  [address] [phone]          Discharge Instructions    Diet - low sodium heart healthy    Complete by:  As directed    Discharge instructions    Complete by:  As directed    Please make sure that you follow up with the [facility] clinic for assistance with your medications including insulin. It is very import that you maintain better control over your blood sugars. New Rx have been sent to the pharmacy for your blood pressure medications and statin. Again please make sure to call the clinic to receive assistance with medications.   Increase activity slowly    Complete by:  As directed       Discharge Medications     Medication List    TAKE these medications   acetaminophen 325  MG tablet Commonly known as:  TYLENOL Take 325-650 mg by mouth every 6 (six) hours as needed (for headaches or pain).   atorvastatin 80 MG tablet Commonly known as:  LIPITOR Take 1 tablet (80 mg total) by mouth daily at 6 PM.   blood glucose meter kit and supplies Dispense based on patient and insurance preference. Use up to four times daily as directed. (FOR ICD-9 250.00, 250.01).   citalopram 40 MG tablet Commonly known as:  CELEXA Take 40 mg by mouth daily.   LANTUS SOLOSTAR 100 UNIT/ML Solostar Pen Generic drug:  Insulin Glargine Inject 50 Units into the skin 2 (two) times daily.   metFORMIN 500 MG tablet Commonly known as:  GLUCOPHAGE Take 1 tablet (500 mg total) by mouth 2 (two) times daily with a meal.   metoprolol tartrate 25 MG tablet Commonly known as:  LOPRESSOR Take 0.5 tablets (12.5 mg total) by mouth 2 (two) times daily.   NOVOLOG FLEXPEN 100 UNIT/ML FlexPen Generic drug:  insulin aspart Inject 15 Units into the skin 3 (three) times daily with meals.   omega-3 acid ethyl esters 1 g capsule Commonly known as:  LOVAZA Take 1 capsule (1 g total) by mouth 2 (two) times daily.   PROAIR HFA 108 (90 Base) MCG/ACT inhaler Generic drug:  albuterol Inhale 2 puffs into  the lungs 4 (four) times daily as needed for wheezing or shortness of breath.   valsartan-hydrochlorothiazide 160-12.5 MG tablet Commonly known as:  DIOVAN-HCT Take 1 tablet by mouth daily.         Outstanding Labs/Studies   FLP/LFTs in 6 weeks if remains on statin therapy.  Duration of Discharge Encounter   Greater than 30 minutes including physician time.  Signed, Reino Bellis NP-C 09/02/2017, 7:08 PM

## 2017-09-02 NOTE — Progress Notes (Signed)
Patient complaining of left chest pain.  ekg done show NSR.  Pain is reproducible with palpation

## 2017-09-02 NOTE — Care Management Note (Signed)
Case Management Note  Patient Details  Name: Christina Evans MRN: [id number] Date of Birth: [birth year]  Subjective/Objective: Pt presented for [place]. Pt is from home with the support of daughter. Pt just moved from [place] to [place]. No Insurance on file or PCP. Pt will need to call the [facility] for follow up appointment.                    Action/Plan: Pt will be able to utilize the [facility] once before she gets established with PCP. Medications will range from $4.00-$10.00. Pt can get enrolled in the PAS program if eligible and medications will range from free to $4.00.   [facility] Number for Diabetics.  . Rx for True Metrix glucose meter -[id number] . Lancets  28 gauge 112059 $2.00 . Strips [id number] . Syringes True Plus syringes 100 in a box.  . Meter is free and 50 strips for $10.00 or 100 strips for $20.00    Expected Discharge Date:                  Expected Discharge Plan:  Home/Self Care  In-House Referral:  NA  Discharge planning Services  CM Consult, Indigent Health Clinic, Medication Assistance  Post Acute Care Choice:  NA Choice offered to:  NA  DME Arranged:  N/A DME Agency:  NA  HH Arranged:  NA HH Agency:  NA  Status of Service:  Completed, signed off  If discussed at Long Length of Stay Meetings, dates discussed:    Additional Comments:  Gala Lewandowsky, RN 09/02/2017, 3:13 PM

## 2017-09-03 MED FILL — !TRUE METRIX BLOOD GLUCOSE: 365 days supply | Qty: 1 | Fill #0

## 2017-09-03 MED FILL — TRUEplus LANCETS 28G MISC: 25 days supply | Qty: 100 | Fill #0

## 2017-09-03 MED FILL — TRUE METRIX TEST STRIP: 25 days supply | Qty: 100 | Fill #0

## 2019-09-13 ENCOUNTER — Observation Stay (HOSPITAL_COMMUNITY): Payer: Medicare Other

## 2019-09-13 ENCOUNTER — Inpatient Hospital Stay (HOSPITAL_COMMUNITY)
Admission: EM | Admit: 2019-09-13 | Discharge: 2019-09-19 | DRG: 065 | Disposition: A | Discharge status: Skilled Nursing Facility | Payer: Medicare Other | Attending: Family Medicine | Admitting: Family Medicine

## 2019-09-13 ENCOUNTER — Other Ambulatory Visit: Payer: Self-pay

## 2019-09-13 ENCOUNTER — Emergency Department (HOSPITAL_COMMUNITY): Payer: Medicare Other

## 2019-09-13 ENCOUNTER — Encounter (HOSPITAL_COMMUNITY): Payer: Self-pay | Admitting: Emergency Medicine

## 2019-09-13 DIAGNOSIS — I6523 Occlusion and stenosis of bilateral carotid arteries: Secondary | ICD-10-CM | POA: Diagnosis present

## 2019-09-13 DIAGNOSIS — Z7189 Other specified counseling: Secondary | ICD-10-CM

## 2019-09-13 DIAGNOSIS — I63522 Cerebral infarction due to unspecified occlusion or stenosis of left anterior cerebral artery: Secondary | ICD-10-CM | POA: Diagnosis not present

## 2019-09-13 DIAGNOSIS — R4182 Altered mental status, unspecified: Secondary | ICD-10-CM | POA: Diagnosis present

## 2019-09-13 DIAGNOSIS — Z638 Other specified problems related to primary support group: Secondary | ICD-10-CM

## 2019-09-13 DIAGNOSIS — Z823 Family history of stroke: Secondary | ICD-10-CM

## 2019-09-13 DIAGNOSIS — I69392 Facial weakness following cerebral infarction: Secondary | ICD-10-CM

## 2019-09-13 DIAGNOSIS — Z79899 Other long term (current) drug therapy: Secondary | ICD-10-CM

## 2019-09-13 DIAGNOSIS — E119 Type 2 diabetes mellitus without complications: Secondary | ICD-10-CM | POA: Diagnosis present

## 2019-09-13 DIAGNOSIS — E860 Dehydration: Secondary | ICD-10-CM | POA: Diagnosis present

## 2019-09-13 DIAGNOSIS — R29705 NIHSS score 5: Secondary | ICD-10-CM | POA: Diagnosis present

## 2019-09-13 DIAGNOSIS — I675 Moyamoya disease: Secondary | ICD-10-CM | POA: Diagnosis present

## 2019-09-13 DIAGNOSIS — Z20828 Contact with and (suspected) exposure to other viral communicable diseases: Secondary | ICD-10-CM | POA: Diagnosis present

## 2019-09-13 DIAGNOSIS — G9340 Encephalopathy, unspecified: Secondary | ICD-10-CM

## 2019-09-13 DIAGNOSIS — E118 Type 2 diabetes mellitus with unspecified complications: Secondary | ICD-10-CM

## 2019-09-13 DIAGNOSIS — I69398 Other sequelae of cerebral infarction: Secondary | ICD-10-CM

## 2019-09-13 DIAGNOSIS — I69354 Hemiplegia and hemiparesis following cerebral infarction affecting left non-dominant side: Secondary | ICD-10-CM

## 2019-09-13 DIAGNOSIS — M8580 Other specified disorders of bone density and structure, unspecified site: Secondary | ICD-10-CM | POA: Diagnosis present

## 2019-09-13 DIAGNOSIS — X58XXXA Exposure to other specified factors, initial encounter: Secondary | ICD-10-CM | POA: Diagnosis present

## 2019-09-13 DIAGNOSIS — E785 Hyperlipidemia, unspecified: Secondary | ICD-10-CM | POA: Diagnosis present

## 2019-09-13 DIAGNOSIS — S92352A Displaced fracture of fifth metatarsal bone, left foot, initial encounter for closed fracture: Secondary | ICD-10-CM | POA: Diagnosis present

## 2019-09-13 DIAGNOSIS — G9349 Other encephalopathy: Secondary | ICD-10-CM | POA: Diagnosis present

## 2019-09-13 DIAGNOSIS — I639 Cerebral infarction, unspecified: Secondary | ICD-10-CM | POA: Diagnosis not present

## 2019-09-13 DIAGNOSIS — J45909 Unspecified asthma, uncomplicated: Secondary | ICD-10-CM | POA: Diagnosis present

## 2019-09-13 DIAGNOSIS — I10 Essential (primary) hypertension: Secondary | ICD-10-CM | POA: Diagnosis present

## 2019-09-13 DIAGNOSIS — R69 Illness, unspecified: Secondary | ICD-10-CM

## 2019-09-13 DIAGNOSIS — Z87891 Personal history of nicotine dependence: Secondary | ICD-10-CM

## 2019-09-13 DIAGNOSIS — Z794 Long term (current) use of insulin: Secondary | ICD-10-CM

## 2019-09-13 DIAGNOSIS — Z515 Encounter for palliative care: Secondary | ICD-10-CM

## 2019-09-13 DIAGNOSIS — I63521 Cerebral infarction due to unspecified occlusion or stenosis of right anterior cerebral artery: Secondary | ICD-10-CM | POA: Diagnosis present

## 2019-09-13 DIAGNOSIS — F329 Major depressive disorder, single episode, unspecified: Secondary | ICD-10-CM | POA: Diagnosis present

## 2019-09-13 LAB — URINALYSIS, ROUTINE W REFLEX MICROSCOPIC
Bilirubin Urine: NEGATIVE
Bilirubin Urine: NEGATIVE
Glucose, UA: NEGATIVE mg/dL
Glucose, UA: >=500 mg/dL — AB
Hgb urine dipstick: NEGATIVE
Hgb urine dipstick: NEGATIVE
Ketones, ur: NEGATIVE mg/dL
Ketones, ur: NEGATIVE mg/dL
Leukocytes,Ua: NEGATIVE
Nitrite: NEGATIVE
Nitrite: NEGATIVE
Protein, ur: NEGATIVE mg/dL
Protein, ur: NEGATIVE mg/dL
Specific Gravity, Urine: 1.011 (ref 1.005–1.030)
Specific Gravity, Urine: 1.035 — ABNORMAL HIGH (ref 1.005–1.030)
pH: 6 (ref 5.0–8.0)
pH: 5 (ref 5.0–8.0)

## 2019-09-13 LAB — COMPREHENSIVE METABOLIC PANEL
ALT: 27 U/L (ref 0–44)
AST: 19 U/L (ref 15–41)
Albumin: 3.9 g/dL (ref 3.5–5.0)
Alkaline Phosphatase: 78 U/L (ref 38–126)
Anion gap: 12 (ref 5–15)
BUN: 31 mg/dL — ABNORMAL HIGH (ref 6–20)
CO2: 26 mmol/L (ref 22–32)
Calcium: 9.5 mg/dL (ref 8.9–10.3)
Chloride: 104 mmol/L (ref 98–111)
Creatinine, Ser: 0.55 mg/dL (ref 0.44–1.00)
GFR calc Af Amer: >60 mL/min (ref >60)
GFR calc non Af Amer: >60 mL/min (ref >60)
Glucose, Bld: 255 mg/dL — ABNORMAL HIGH (ref 70–99)
Potassium: 3.8 mmol/L (ref 3.5–5.1)
Sodium: 142 mmol/L (ref 135–145)
Total Bilirubin: 0.7 mg/dL (ref 0.3–1.2)
Total Protein: 8 g/dL (ref 6.5–8.1)

## 2019-09-13 LAB — I-STAT BETA HCG BLOOD, ED (MC, WL, AP ONLY): I-stat hCG, quantitative: <5 m[IU]/mL (ref <5)

## 2019-09-13 LAB — CBC
HCT: 44.8 % (ref 36.0–46.0)
Hemoglobin: 14.5 g/dL (ref 12.0–15.0)
MCH: 30.1 pg (ref 26.0–34.0)
MCHC: 32.4 g/dL (ref 30.0–36.0)
MCV: 93.1 fL (ref 80.0–100.0)
Platelets: 370 10*3/uL (ref 150–400)
RBC: 4.81 MIL/uL (ref 3.87–5.11)
RDW: 12.4 % (ref 11.5–15.5)
WBC: 9.5 10*3/uL (ref 4.0–10.5)
nRBC: 0 % (ref 0.0–0.2)

## 2019-09-13 LAB — CK: Total CK: 220 U/L (ref 38–234)

## 2019-09-13 LAB — RAPID URINE DRUG SCREEN, HOSP PERFORMED
Amphetamines: NOT DETECTED
Barbiturates: NOT DETECTED
Benzodiazepines: NOT DETECTED
Cocaine: NOT DETECTED
Opiates: NOT DETECTED
Tetrahydrocannabinol: NOT DETECTED

## 2019-09-13 LAB — HEMOGLOBIN A1C
Hgb A1c MFr Bld: 9.8 % — ABNORMAL HIGH (ref 4.8–5.6)
Mean Plasma Glucose: 234.56 mg/dL

## 2019-09-13 LAB — AMMONIA: Ammonia: 12 umol/L (ref 9–35)

## 2019-09-13 LAB — TROPONIN I (HIGH SENSITIVITY)
Troponin I (High Sensitivity): 3 ng/L (ref <18)
Troponin I (High Sensitivity): 3 ng/L (ref <18)

## 2019-09-13 LAB — POCT I-STAT EG7
Acid-Base Excess: 2 mmol/L (ref 0.0–2.0)
Bicarbonate: 28.3 mmol/L — ABNORMAL HIGH (ref 20.0–28.0)
Calcium, Ion: 1.16 mmol/L (ref 1.15–1.40)
HCT: 44 % (ref 36.0–46.0)
Hemoglobin: 15 g/dL (ref 12.0–15.0)
O2 Saturation: 73 %
Potassium: 3.7 mmol/L (ref 3.5–5.1)
Sodium: 141 mmol/L (ref 135–145)
TCO2: 30 mmol/L (ref 22–32)
pCO2, Ven: 49.6 mmHg (ref 44.0–60.0)
pH, Ven: 7.364 (ref 7.250–7.430)
pO2, Ven: 40 mmHg (ref 32.0–45.0)

## 2019-09-13 LAB — TSH
TSH: 1.083 u[IU]/mL (ref 0.350–4.500)
TSH: 1.423 u[IU]/mL (ref 0.350–4.500)

## 2019-09-13 LAB — PROTIME-INR
INR: 0.9 (ref 0.8–1.2)
Prothrombin Time: 12.4 seconds (ref 11.4–15.2)

## 2019-09-13 LAB — CBG MONITORING, ED: Glucose-Capillary: 265 mg/dL — ABNORMAL HIGH (ref 70–99)

## 2019-09-13 LAB — PHOSPHORUS: Phosphorus: 4 mg/dL (ref 2.5–4.6)

## 2019-09-13 LAB — HIV ANTIBODY (ROUTINE TESTING W REFLEX): HIV Screen 4th Generation wRfx: NONREACTIVE

## 2019-09-13 LAB — MAGNESIUM: Magnesium: 2.2 mg/dL (ref 1.7–2.4)

## 2019-09-13 LAB — SARS CORONAVIRUS 2 (TAT 6-24 HRS): SARS Coronavirus 2: NEGATIVE

## 2019-09-13 LAB — ETHANOL: Alcohol, Ethyl (B): <10 mg/dL (ref <10)

## 2019-09-13 LAB — LIPASE, BLOOD: Lipase: 32 U/L (ref 11–51)

## 2019-09-13 LAB — LACTIC ACID, PLASMA: Lactic Acid, Venous: 1.5 mmol/L (ref 0.5–1.9)

## 2019-09-13 MED ORDER — SODIUM CHLORIDE 0.9 % IV SOLN
1.0000 g | Freq: Once | INTRAVENOUS | Status: AC
  Administered 2019-09-13: 1 g via INTRAVENOUS
  Filled 2019-09-13: qty 10

## 2019-09-13 MED ORDER — ENOXAPARIN SODIUM 40 MG/0.4ML ~~LOC~~ SOLN
40.0000 mg | Freq: Every day | SUBCUTANEOUS | Status: DC
  Administered 2019-09-14: 40 mg via SUBCUTANEOUS
  Filled 2019-09-13: qty 0.4

## 2019-09-13 MED ORDER — ASPIRIN 325 MG PO TABS
325.0000 mg | ORAL_TABLET | Freq: Every day | ORAL | Status: DC
  Administered 2019-09-13 – 2019-09-14 (×2): 325 mg via ORAL
  Filled 2019-09-13 (×2): qty 1

## 2019-09-13 MED ORDER — SODIUM CHLORIDE 0.9% FLUSH: 3.0000 mL | Freq: Once | INTRAVENOUS | Status: DC

## 2019-09-13 MED ORDER — SODIUM CHLORIDE 0.9 % IV SOLN
INTRAVENOUS | Status: DC
  Administered 2019-09-13 – 2019-09-19 (×7): via INTRAVENOUS

## 2019-09-13 MED ORDER — ATORVASTATIN CALCIUM 80 MG PO TABS
80.0000 mg | ORAL_TABLET | Freq: Every day | ORAL | Status: DC
  Administered 2019-09-14 – 2019-09-19 (×6): 80 mg via ORAL
  Filled 2019-09-13 (×6): qty 1

## 2019-09-13 MED ORDER — INSULIN ASPART 100 UNIT/ML ~~LOC~~ SOLN
0.0000 [IU] | Freq: Three times a day (TID) | SUBCUTANEOUS | Status: DC
  Administered 2019-09-14: 5 [IU] via SUBCUTANEOUS

## 2019-09-13 MED ORDER — HYDROCHLOROTHIAZIDE 12.5 MG PO CAPS
12.5000 mg | ORAL_CAPSULE | Freq: Every day | ORAL | Status: DC
  Administered 2019-09-14: 12.5 mg via ORAL
  Filled 2019-09-13: qty 1

## 2019-09-13 MED ORDER — VALSARTAN-HYDROCHLOROTHIAZIDE 160-12.5 MG PO TABS: 1.0000 | ORAL_TABLET | Freq: Every day | ORAL | Status: DC

## 2019-09-13 MED ORDER — STROKE: EARLY STAGES OF RECOVERY BOOK: Freq: Once | Status: DC

## 2019-09-13 MED ORDER — ASPIRIN 300 MG RE SUPP: 300.0000 mg | Freq: Every day | RECTAL | Status: DC

## 2019-09-13 MED ORDER — STROKE: EARLY STAGES OF RECOVERY BOOK
Freq: Once | Status: AC
  Administered 2019-09-14: 10:00:00
  Filled 2019-09-13: qty 1

## 2019-09-13 MED ORDER — SODIUM CHLORIDE 0.9 % IV SOLN
Freq: Once | INTRAVENOUS | Status: AC
  Administered 2019-09-13: 19:00:00 via INTRAVENOUS

## 2019-09-13 MED ORDER — IRBESARTAN 300 MG PO TABS
150.0000 mg | ORAL_TABLET | Freq: Every day | ORAL | Status: DC
  Administered 2019-09-14: 150 mg via ORAL
  Filled 2019-09-13: qty 1

## 2019-09-13 MED ORDER — CITALOPRAM HYDROBROMIDE 40 MG PO TABS
40.0000 mg | ORAL_TABLET | Freq: Every day | ORAL | Status: DC
  Administered 2019-09-14 – 2019-09-19 (×6): 40 mg via ORAL
  Filled 2019-09-13 (×6): qty 1

## 2019-09-13 MED ORDER — IOHEXOL 350 MG/ML SOLN
75.0000 mL | Freq: Once | INTRAVENOUS | Status: AC | PRN
  Administered 2019-09-13: 75 mL via INTRAVENOUS

## 2019-09-13 NOTE — ED Provider Notes (Signed)
[facility] EMERGENCY DEPARTMENT Provider Note   CSN: [id number] Arrival date & time: 09/13/19  1242     History   Chief Complaint Chief Complaint  Patient presents with   Altered Mental Status    HPI Christina Evans is a 58 y.o. female.     HPI Patient is brought in by her daughter.  Patient does have medical history significant for prior CVA with left-sided deficit.  At baseline however patient's daughter reports that the patient is appropriately verbally interactive.  She communicates with her regularly on the phone and sending pictures and text messages.  The patient lives approximately 2 hours away with a female partner.  The patient's daughter reports that she and her sister started to become concerned over the past several days as her responses to their texts and calls seemed delayed and slightly confused.  This morning, they were not getting responses.  Her sister drove to her house and found her mother sitting in her bedroom beside the bed with urine soaked clothing and very confused.  She reportedly did not recognize her daughter.  When the female companion with whom the patient lives  was asked about this by the daughters, he reported that the patient had been resistant to seeking any treatment and he did not think that she seemed all that abnormal.  He is not present to give additional history.  The patient's daughter is here with her.  The patient is denying any pain.  The patient's daughter reports that earlier she had endorsed a headache but patient does not endorse that at this time.  Patient also denies chest pain or shortness of breath.  She does seem mildly confused.  She has a pre-existing significant left-sided deficit from old stroke.  Patient's daughter reports that the patient has been getting meclizine and Dramamine for "passing out episodes".  Reportedly the patient will get a electric-like shock sensation on the side of her head and then jolted  backwards and be poorly responsive.  Per the daughter, they were told by the patients primary care provider to keep the patient awake when she does this and ostensibly the meclizine and dramamine are to help with these episodes.  The patient's daughter reports she is concerned that she might have had a seizure but the patient has no formal diagnosis of seizure history or disorder.  The patient is alert.  She is confused to the date and time.  She is oriented to place and now recognizes her daughter appropriately.  She however continues to seem somewhat confused and possibly unreliable historian. Past Medical History:  Diagnosis Date   Asthma    CVA (cerebral vascular accident) ([place])    Diabetes mellitus without complication [facility])     Patient Active Problem List   Diagnosis Date Noted   AMS (altered mental status) 09/13/2019   Insulin dependent diabetes mellitus 09/02/2017   Hyperlipidemia 09/02/2017   Hypertension 09/02/2017   Chest pain 09/02/2017    Past Surgical History:  Procedure Laterality Date   NO PAST SURGERIES       OB History   No obstetric history on file.      Home Medications    Prior to Admission medications   Medication Sig Start Date End Date Taking? Authorizing Provider  acetaminophen (TYLENOL) 325 MG tablet Take 325-650 mg by mouth every 6 (six) hours as needed (for headaches or pain).    [provider]  albuterol (PROAIR HFA) 108 (90 Base) MCG/ACT inhaler  Inhale 2 puffs into the lungs 4 (four) times daily as needed for wheezing or shortness of breath.     [provider]  atorvastatin (LIPITOR) 80 MG tablet Take 1 tablet (80 mg total) by mouth daily at 6 PM. 09/03/17   Cheryln Manly, NP  blood glucose meter kit and supplies Dispense based on patient and insurance preference. Use up to four times daily as directed. (FOR ICD-9 250.00, 250.01). 09/02/17   Cheryln Manly, NP  citalopram (CELEXA) 40 MG tablet Take 40 mg by mouth  daily.    [provider]  insulin aspart (NOVOLOG FLEXPEN) 100 UNIT/ML FlexPen Inject 15 Units into the skin 3 (three) times daily with meals.    [provider]  Insulin Glargine (LANTUS SOLOSTAR) 100 UNIT/ML Solostar Pen Inject 50 Units into the skin 2 (two) times daily.    [provider]  metFORMIN (GLUCOPHAGE) 500 MG tablet Take 1 tablet (500 mg total) by mouth 2 (two) times daily with a meal. 09/02/17   Cheryln Manly, NP  metoprolol tartrate (LOPRESSOR) 25 MG tablet Take 0.5 tablets (12.5 mg total) by mouth 2 (two) times daily. 09/02/17   Cheryln Manly, NP  omega-3 acid ethyl esters (LOVAZA) 1 g capsule Take 1 capsule (1 g total) by mouth 2 (two) times daily. 09/02/17   Cheryln Manly, NP  valsartan-hydrochlorothiazide (DIOVAN-HCT) 160-12.5 MG tablet Take 1 tablet by mouth daily. 09/02/17   Cheryln Manly, NP    Family History No family history on file.  Social History Social History   Tobacco Use   Smoking status: Former Smoker   Smokeless tobacco: Never Used   Tobacco comment: QUIT IN 2008  Substance Use Topics   Alcohol use: Yes    Comment: RARE   Drug use: No     Allergies   Patient has no known allergies.   Review of Systems Review of Systems Level 5 caveat cannot obtain reliable review of systems due to patient confusion.  Physical Exam Updated Vital Signs BP 131/72    Pulse 98    Temp 98.3 F (36.8 C) (Oral)    Resp 15    SpO2 96%   Physical Exam Constitutional:      Comments: Patient is alert.  She does seem to have mildly confused appearance.  She does not have any respiratory distress.  She is not somnolent.  Color is good.  HENT:     Head: Normocephalic and atraumatic.     Nose: Nose normal.     Mouth/Throat:     Mouth: Mucous membranes are moist.  Eyes:     Extraocular Movements: Extraocular movements intact.     Pupils: Pupils are equal, round, and reactive to light.  Neck:     Musculoskeletal:  Neck supple.  Cardiovascular:     Rate and Rhythm: Normal rate and regular rhythm.     Pulses: Normal pulses.     Heart sounds: Normal heart sounds.  Pulmonary:     Effort: Pulmonary effort is normal.     Breath sounds: Normal breath sounds.  Abdominal:     General: There is no distension.     Palpations: Abdomen is soft.     Tenderness: There is no abdominal tenderness. There is no guarding.  Musculoskeletal:     Comments: Patient has contracture of the left upper extremity.  No significant peripheral edema.  Calves are nontender.  Legs are symmetric.  No significant injuries or deformities.  No  joint effusions.  Skin:    General: Skin is warm and dry.  Neurological:     Comments: Patient is verbally interactive.  She is confused to the time.  She now correctly identified the hospital and her daughter's name.  She also can correctly identify her home address.  Speech is clear.      ED Treatments / Results  Labs (all labs ordered are listed, but only abnormal results are displayed) Labs Reviewed  COMPREHENSIVE METABOLIC PANEL - Abnormal; Notable for the following components:      Result Value   Glucose, Bld 255 (*)    BUN 31 (*)    All other components within normal limits  URINALYSIS, ROUTINE W REFLEX MICROSCOPIC - Abnormal; Notable for the following components:   APPearance HAZY (*)    Leukocytes,Ua SMALL (*)    Bacteria, UA RARE (*)    All other components within normal limits  URINALYSIS, ROUTINE W REFLEX MICROSCOPIC - Abnormal; Notable for the following components:   APPearance HAZY (*)    Specific Gravity, Urine 1.035 (*)    Glucose, UA >=500 (*)    Bacteria, UA RARE (*)    All other components within normal limits  CBG MONITORING, ED - Abnormal; Notable for the following components:   Glucose-Capillary 265 (*)    All other components within normal limits  POCT I-STAT EG7 - Abnormal; Notable for the following components:   Bicarbonate 28.3 (*)    All other  components within normal limits  CULTURE, BLOOD (ROUTINE X 2)  CULTURE, BLOOD (ROUTINE X 2)  URINE CULTURE  URINE CULTURE  SARS CORONAVIRUS 2 (TAT 6-24 HRS)  CBC  ETHANOL  LIPASE, BLOOD  LACTIC ACID, PLASMA  PROTIME-INR  CK  AMMONIA  MAGNESIUM  PHOSPHORUS  TSH  BLOOD GAS, VENOUS  RAPID URINE DRUG SCREEN, HOSP PERFORMED  URINALYSIS, ROUTINE W REFLEX MICROSCOPIC  CBG MONITORING, ED  I-STAT BETA HCG BLOOD, ED ([facility], [facility], [facility] ONLY)  TROPONIN I (HIGH SENSITIVITY)  TROPONIN I (HIGH SENSITIVITY)    EKG EKG Interpretation  Date/Time:  Wednesday September 13 2019 12:52:09 EDT Ventricular Rate:  100 PR Interval:  158 QRS Duration: 78 QT Interval:  330 QTC Calculation: 425 R Axis:   -15 Text Interpretation:  Normal sinus rhythm Possible Anterior infarct , age undetermined Abnormal ECG no change from old Confirmed by Charlesetta Shanks [phone]) on 09/13/2019 5:02:21 PM   Radiology Ct Head Wo Contrast  Result Date: 09/13/2019 CLINICAL DATA:  58 year old female with altered mental status. EXAM: CT HEAD WITHOUT CONTRAST TECHNIQUE: Contiguous axial images were obtained from the base of the skull through the vertex without intravenous contrast. COMPARISON:  None. FINDINGS: Brain: There is mild age-related atrophy. Large area of old infarct and encephalomalacia noted in the right MCA territory. There is associated mild ex vacuo dilatation of the right lateral ventricle. A focal area of low attenuation in the left frontal lobe abutting the frontal horn of the left lateral ventricle most likely represents an area of subacute or old infarct. Clinical correlation is recommended. MRI may provide better characterization if clinically indicated. There is no acute intracranial hemorrhage. No mass effect or midline shift. No extra-axial fluid collection. Vascular: No hyperdense vessel or unexpected calcification. Skull: Normal. Negative for fracture or focal lesion. Sinuses/Orbits: No acute finding. Other: None  IMPRESSION: 1. No acute intracranial hemorrhage. 2. Large right MCA territory old infarct and encephalomalacia. 3. Probable focal area of subacute or old infarct in the left frontal lobe. Clinical  correlation is recommended. Electronically Signed   By: Anner Crete M.D.   On: 09/13/2019 14:48    Procedures Procedures (including critical care time)  Medications Ordered in ED Medications  sodium chloride flush (NS) 0.9 % injection 3 mL (has no administration in time range)  cefTRIAXone (ROCEPHIN) 1 g in sodium chloride 0.9 % 100 mL IVPB (has no administration in time range)  0.9 %  sodium chloride infusion (has no administration in time range)     Initial Impression / Assessment and Plan / ED Course  I have reviewed the triage vital signs and the nursing notes.  Pertinent labs & imaging results that were available during my care of the patient were reviewed by me and considered in my medical decision making (see chart for details).  Clinical Course as of Sep 12 1702  Wed Sep 13, 2019  1701 Consult: Family practice resident Dr. Edwina Barth accepts for admission.   [MP]    Clinical Course User Index [MP] Charlesetta Shanks, MD      Mental status change of unclear etiology.  Patient has had significant prior stroke.  At baseline however she has high cognitive function per her daughter.  We will continue diagnostic evaluation with MRI and EEG.  No immediate signs of infectious etiology.  Patient is alert with stable respiratory status will be admitted to family medicine service.  Final Clinical Impressions(s) / ED Diagnoses   Final diagnoses:  Encephalopathy  Severe comorbid illness    ED Discharge Orders    None       Charlesetta Shanks, MD 09/13/19 1704

## 2019-09-13 NOTE — ED Notes (Signed)
Daughter at bedside.

## 2019-09-13 NOTE — ED Notes (Signed)
MD Arora at bedside.  

## 2019-09-13 NOTE — ED Notes (Signed)
Lab stated that they made an error in urine results, will attempt to get a urine sample.

## 2019-09-13 NOTE — H&P (Addendum)
[facility] Admission History and Physical Service Pager: [phone]  Patient name: Christina Evans Medical record number: [id number] Date of birth: 21-Oct-1961 Age: 58 y.o. Gender: female  Primary Care Provider: Patient, No Pcp Per Consultants: None Code Status: Full Preferred Emergency Contact: Daughter, Destinee, Taber [phone]  Chief Complaint: Altered mental status  Assessment and Plan: Dylana Shaw is a 58 y.o. female presenting with altered mental status. PMH is significant for history of stroke, insulin-dependent diabetes, hyperlipidemia, hypertension.  Altered mental status Christina Evans presented with altered mental status from between few days and 3 weeks.  No history of seizures, however there is a suspicion for this as the patient was found in a pool of her urine and what could be a post ictal state of confusion.  The patient had an EEG in the ED that is currently pending. Stroke is high on the differential with a brain MRI pending.  The patient has had a history of stroke in the past with residual left-sided weakness. Head CT does show probable focal area of subacute or old infarct of the left frontal lobe. Intracranial hemorrhage has been ruled out due to a head CT with no acute bleed.  Infection is also a possibility but unlikely due to lack of vital sign changes or evidence of infection on physical exam.  Initial urinalysis was not convincing for UTI with only small leukocytes and the patient does not have any suprapubic tenderness or dysuria.  We will also get a chest x-ray for signs of infection.  Electrolyte abnormalities are not seen on CMP provide reassurance that electrolyte imbalance is not the cause of her mental status, this is further supported by the duration that this is occurred.  Initial high-sensitivity troponins are unremarkable at 3.  CBC is currently unremarkable as well.  Medication overdose/adverse effect is also on the differential  as the patient's daughter expressed that patient's boyfriend said that the patient was taken more of her medications than what she was supposed to due to her confusion. -Admit to med telemetry-attending Dr. Andria Frames -Brain MRI -Consider neurology consult based on results of EEG and MRI -Chest x-ray -Neuro checks q2hr for 12 hrs -Blood and urine cultures drawn -EEG pending -TSH -Follow troponins -Vitals per floor -I's and O's - PT/OT/Speech eval  Potential foot injury The daughter Schadler) noted that Christina Evans had been experiencing tenderness along her left foot.  The daughter is unaware of any trauma.  On physical exam, she is tender to palpation of the foot on the dorsal surface proximal to her toes.  There is an area of mild discoloration/darkening.  No evidence of ulceration.  No signs of erythema or purulence.  No known trauma.  Is difficult to discern if this is a chronic issue or potentially a new acute trauma in her acute, debilitated, altered state. -Follow-up left foot x-ray  Insulin-dependent diabetes Patient's daughter unsure of amount of insulin patient typically uses.  Initial glucose of 255 on presentation.  Home medications appear to include Metformin 1000 mg twice per day, however patient's daughter was not entirely sure of patient's medications or their doses. -Sensitive sliding scale -Monitor CBGs  Hyperlipidemia Home medications include atorvastatin 80 mg/day -Continue home atorvastatin when no longer NPO  Hypertension Currently normotensive.  Home medications include valsartan-hydrochlorothiazide, metoprolol tartrate. -Continue home valsartan-hydrochlorothiazide when no longer NPO -Monitor blood pressure.  Depression Home medications include Celexa 40 mg daily -Continue home medication when no longer NPO  Asthma -Home medication include albuterol  2 puffs 4 times daily as needed -Continue albuterol as needed.  FEN/GI: NPO Prophylaxis:  Lovenox  Disposition: Admit to med-tele  History of Present Illness:  Christina Evans is a 58 y.o. female presenting with altered mental status.  Apparently the patient's last known normal was approximately 2-3 weeks ago per the daughter.  At this time she was in "a funk" and so the patient came to stay with the daughter.  The daughter states that this time the patient was acting normal.  However approximately a week ago the daughter noticed that the patient was not responding to calls and text as she normally does.  The patient's daughter also try to contact the patient's boyfriend who lives with her and did not get any response.  The patient's daughter states that after this she attempted to call the home nurse and asked her to go and check on the patient.  Patient's daughter states that the nurse's response was not satisfactory at that time and so she attempted to contact her sister (patient's other daughter) to ask her to drop down and see the patient.  Apparently when the sister arrived found the patient sitting in a pool of dried urine with the room smelling of urine.  The patient did not recognize her daughter and continue to "not act herself".  The daughter also spoke to the boyfriend who was there at the time and stated that he felt she "looked fine".  The patient's boyfriend later stated to the daughter that she may have taken an extra dose of her Metformin or one of her other medications but he was not sure.  Patient's daughter states that the patient has pre-existing left extremity weakness but that at baseline she is interactive and is typically not in a confused state.  Per the daughter patient does not smoke, does not drink, does not use illicit drugs  Review Of Systems: Per HPI with the following additions: None  Review of Systems  Unable to perform ROS: Mental status change    Patient Active Problem List   Diagnosis Date Noted  . AMS (altered mental status) 09/13/2019  .  Insulin dependent diabetes mellitus 09/02/2017  . Hyperlipidemia 09/02/2017  . Hypertension 09/02/2017  . Chest pain 09/02/2017    Past Medical History: Past Medical History:  Diagnosis Date  . Asthma   . CVA (cerebral vascular accident) ([place])   . Diabetes mellitus without complication [facility])     Past Surgical History: Past Surgical History:  Procedure Laterality Date  . NO PAST SURGERIES      Social History: Social History   Tobacco Use  . Smoking status: Former Research scientist (life sciences)  . Smokeless tobacco: Never Used  . Tobacco comment: QUIT IN 2008  Substance Use Topics  . Alcohol use: Yes    Comment: RARE  . Drug use: No   Additional social history: Lives at home with significant other (not married).  Stays in close communication with 2 of her children who live in [place].  She has multiple other children who currently live in [place]. Please also refer to relevant sections of EMR.  Family History: No family history on file.   Allergies and Medications: No Known Allergies No current facility-administered medications on file prior to encounter.    Current Outpatient Medications on File Prior to Encounter  Medication Sig Dispense Refill  . acetaminophen (TYLENOL) 325 MG tablet Take 325-650 mg by mouth every 6 (six) hours as needed (for headaches or pain).    Marland Kitchen  albuterol (PROAIR HFA) 108 (90 Base) MCG/ACT inhaler Inhale 2 puffs into the lungs 4 (four) times daily as needed for wheezing or shortness of breath.     Marland Kitchen atorvastatin (LIPITOR) 80 MG tablet Take 1 tablet (80 mg total) by mouth daily at 6 PM. 30 tablet 0  . blood glucose meter kit and supplies Dispense based on patient and insurance preference. Use up to four times daily as directed. (FOR ICD-9 250.00, 250.01). 1 each 0  . citalopram (CELEXA) 40 MG tablet Take 40 mg by mouth daily.    . insulin aspart (NOVOLOG FLEXPEN) 100 UNIT/ML FlexPen Inject 15 Units into the skin 3 (three) times daily with meals.    . Insulin  Glargine (LANTUS SOLOSTAR) 100 UNIT/ML Solostar Pen Inject 50 Units into the skin 2 (two) times daily.    . insulin lispro (HUMALOG) 100 UNIT/ML KwikPen     . metFORMIN (GLUCOPHAGE) 500 MG tablet Take 1 tablet (500 mg total) by mouth 2 (two) times daily with a meal. 60 tablet 0  . metFORMIN (GLUCOPHAGE-XR) 500 MG 24 hr tablet Take 1,000 mg by mouth 2 (two) times daily.    . metFORMIN (GLUCOPHAGE-XR) 500 MG 24 hr tablet Take 500 mg by mouth 2 (two) times daily.    . metoprolol tartrate (LOPRESSOR) 25 MG tablet Take 0.5 tablets (12.5 mg total) by mouth 2 (two) times daily. 60 tablet 0  . omega-3 acid ethyl esters (LOVAZA) 1 g capsule Take 1 capsule (1 g total) by mouth 2 (two) times daily. 60 capsule 0  . valsartan-hydrochlorothiazide (DIOVAN-HCT) 160-12.5 MG tablet Take 1 tablet by mouth daily. 30 tablet 1    Objective: BP 128/77   Pulse 100   Temp 98.3 F (36.8 C) (Oral)   Resp 14   SpO2 97%  Exam: General: Alert and oriented to person, place but not to time.  No apparent distress  Eyes: PERRLA ENTM: No pharyengeal erythema Neck: Nontender Cardiovascular: RRR with no murmurs noted Respiratory: CTA bilaterally  Gastrointestinal: Bowel sounds present. No abdominal pain MSK: Upper extremity strength 5/5 on right, 4/5 on left, Lower extremity strength 5/5 on right, 4/5 on left. Some faint bruising to left ankle. Derm: No rashes noted Neuro: Unable to assess extraocular movement due to patient cooperation, fine touch sensation intact bilaterally in upper and lower extremities. Psych: Behavior and speech appropriate to situation  Labs and Imaging: CBC BMET  Recent Labs  Lab 09/13/19 1257 09/13/19 1339  WBC 9.5  --   HGB 14.5 15.0  HCT 44.8 44.0  PLT 370  --    Recent Labs  Lab 09/13/19 1257 09/13/19 1339  NA 142 141  K 3.8 3.7  CL 104  --   CO2 26  --   BUN 31*  --   CREATININE 0.55  --   GLUCOSE 255*  --   CALCIUM 9.5  --        Lurline Del, DO 09/13/2019, 6:43  PM PGY-1, [place] Intern pager: [phone], text pages welcome  FPTS Upper-Level Resident Addendum   I have independently interviewed and examined the patient. I have discussed the above with the original author and agree with their documentation. My edits for correction/addition/clarification are in blue. Please see also any attending notes.    Matilde Haymaker MD PGY-2, [facility] 09/13/2019 9:23 PM  [place] Service pager: [phone] (text pages welcome through [facility])

## 2019-09-13 NOTE — ED Notes (Signed)
EEG at bedside.

## 2019-09-13 NOTE — Progress Notes (Signed)
EEG complete - results pending 

## 2019-09-13 NOTE — ED Notes (Signed)
Pt transported to MRI...daughter remains with the patient.

## 2019-09-13 NOTE — ED Triage Notes (Signed)
Family reports pt was found at home covered in urine and altered. LSN week. Unsure if taking too much metformin or none. 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 Pt lives [place] away and brought in by daughter.

## 2019-09-13 NOTE — Progress Notes (Signed)
As instructed by family medicine primary team we have been waiting for MRI results to come back, as they are now read as showing a likely subacute left ACA infarct we have officially consulted neurology.  On initial review imaging seems to show infarct that might be days old and clearly outside of TPA window.  Appreciate neurology recs  -Dr. Criss Rosales

## 2019-09-13 NOTE — Consult Note (Signed)
Neurology Consultation  Reason for Consult: subacute stroke Referring Physician: Dr Andria Frames  CC: AMS  History is obtained from: Chart and daughter at bedside  HPI: Christina Evans is a 58 y.o. 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  She could not pinpoint the exact last known normal but it was sometime last week. There are said that usually the patient is able to communicate verbally and with cell phone via text and phone calls but she had been acting confused.  She would not respond to attacks over the last few days and she went to check on her.  The patient lives 2 hours away and the daughter lives in the city here. The daughter found the patient in a pool of her own urine confused and decided to bring her to the emergency room for further evaluation. Noncontrast head CT was done that showed the old right-sided known encephalomalacia but there was also abnormal hypodensity in the left frontal area, which seemed like an establish stroke-was either chronic or subacute stroke. This was followed by an MRI of the brain that showed that the area in the left frontal region is a subacute stroke.  Neurological consultation was made for further central recommendations.  The cause of prior stroke is unknown.  The daughter says she was very young whe her mother had the stroke and does not remember all the details.  She does report that the patient has been having personality changes as well as difficulty taking care of herself and requires significant amount of assistance with activities of daily living including cooking cleaning and caring for herself.  A nurse comes into the house multiple days a week to help.  Patient insists on living by herself/with a partner  and not with her children.  LKW: Unclear-about a week ago. tpa given?: no, outside the window Premorbid modified Rankin scale (mRS): 3  ROS:  ROS was performed and is negative except as noted in the HPI.    Past Medical History:  Diagnosis Date  . Asthma   . CVA (cerebral vascular accident) ([place])   . Diabetes mellitus without complication ([place])    No family history on file.   Social History:   reports that she has quit smoking. She has never used smokeless tobacco. She reports current alcohol use. She reports that she does not use drugs.  Medications  Current Facility-Administered Medications:  .   stroke: mapping our early stages of recovery book, , Does not apply, Once, Matilde Haymaker, MD .  0.9 %  sodium chloride infusion, , Intravenous, Continuous, Matilde Haymaker, MD, Last Rate: 117 mL/hr at 09/13/19 2026 .  [START ON 09/14/2019] atorvastatin (LIPITOR) tablet 80 mg, 80 mg, Oral, q1800, Matilde Haymaker, MD .  citalopram (CELEXA) tablet 40 mg, 40 mg, Oral, Daily, Matilde Haymaker, MD .  enoxaparin (LOVENOX) injection 40 mg, 40 mg, Subcutaneous, Q24H, Matilde Haymaker, MD .  Derrill Memo ON 09/14/2019] insulin aspart (novoLOG) injection 0-9 Units, 0-9 Units, Subcutaneous, TID WC, Matilde Haymaker, MD .  sodium chloride flush (NS) 0.9 % injection 3 mL, 3 mL, Intravenous, Once, Matilde Haymaker, MD .  valsartan-hydrochlorothiazide (DIOVAN-HCT) 160-12.5 MG per tablet 1 tablet, 1 tablet, Oral, Daily, Matilde Haymaker, MD  Current Outpatient Medications:  .  acetaminophen (TYLENOL) 325  MG tablet, Take 325-650 mg by mouth every 6 (six) hours as needed (for headaches or pain)., Disp: , Rfl:  .  albuterol (PROAIR HFA) 108 (90 Base) MCG/ACT inhaler, Inhale 2 puffs into the lungs 4 (four) times daily as needed for wheezing or shortness of breath. , Disp: , Rfl:  .  atorvastatin (LIPITOR) 80 MG tablet, Take 1 tablet (80 mg total) by mouth daily at 6 PM., Disp: 30 tablet, Rfl: 0 .  blood glucose meter kit and supplies,  Dispense based on patient and insurance preference. Use up to four times daily as directed. (FOR ICD-9 250.00, 250.01)., Disp: 1 each, Rfl: 0 .  citalopram (CELEXA) 40 MG tablet, Take 40 mg by mouth daily., Disp: , Rfl:  .  insulin aspart (NOVOLOG FLEXPEN) 100 UNIT/ML FlexPen, Inject 15 Units into the skin 3 (three) times daily with meals., Disp: , Rfl:  .  Insulin Glargine (LANTUS SOLOSTAR) 100 UNIT/ML Solostar Pen, Inject 50 Units into the skin 2 (two) times daily., Disp: , Rfl:  .  insulin lispro (HUMALOG) 100 UNIT/ML KwikPen, , Disp: , Rfl:  .  metFORMIN (GLUCOPHAGE) 500 MG tablet, Take 1 tablet (500 mg total) by mouth 2 (two) times daily with a meal., Disp: 60 tablet, Rfl: 0 .  metFORMIN (GLUCOPHAGE-XR) 500 MG 24 hr tablet, Take 1,000 mg by mouth 2 (two) times daily., Disp: , Rfl:  .  metFORMIN (GLUCOPHAGE-XR) 500 MG 24 hr tablet, Take 500 mg by mouth 2 (two) times daily., Disp: , Rfl:  .  metoprolol tartrate (LOPRESSOR) 25 MG tablet, Take 0.5 tablets (12.5 mg total) by mouth 2 (two) times daily., Disp: 60 tablet, Rfl: 0 .  omega-3 acid ethyl esters (LOVAZA) 1 g capsule, Take 1 capsule (1 g total) by mouth 2 (two) times daily., Disp: 60 capsule, Rfl: 0 .  valsartan-hydrochlorothiazide (DIOVAN-HCT) 160-12.5 MG tablet, Take 1 tablet by mouth daily., Disp: 30 tablet, Rfl: 1  Exam: Current vital signs: BP 128/64   Pulse 98   Temp 98.3 F (36.8 C) (Oral)   Resp 15   SpO2 97%  Vital signs in last 24 hours: Temp:  [98.3 F (36.8 C)] 98.3 F (36.8 C) (10/21 1254) Pulse Rate:  [94-101] 98 (10/21 1815) Resp:  [14-20] 15 (10/21 1815) BP: (97-138)/(64-80) 128/64 (10/21 1815) SpO2:  [96 %-98 %] 97 % (10/21 1815)  GENERAL: Awake, alert in NAD, looks somewhat disheveled HEENT: - Normocephalic and atraumatic, dry mm, no LN++, no Thyromegally LUNGS - Clear to auscultation bilaterally with no wheezes CV - S1S2 RRR, no m/r/g, equal pulses bilaterally. ABDOMEN - Soft, nontender, nondistended with  normoactive BS Ext: warm, well perfused, intact peripheral pulses, no edema  NEURO:  Mental Status: Awake, alert, oriented to place and person. Got the month right, date wrong, year wrong Language: speech is clear.  Naming, repetition, fluency, and comprehension intact. Attnetion concentration is poor Cranial Nerves: PERRL. EOMI, visual fields full, subtle left nasolabial fold flattening,  facial sensation decreased on left, hearing intact, tongue/uvula/soft palate midline, normal sternocleidomastoid and trapezius muscle strength. No evidence of tongue atrophy or fibrillations Motor: LUE has contractures distally and increased tone proximally with proximal strength 3-4/5 and 1-2/5 distally with contractures in fingers. Left leg has increased tone and 3/5 strength. RUE and RLE are full strength. Tone: increased on left, normal on right Sensation- decreased on left (baseline per daughter) Coordination: no dysmetria Gait- deferred  NIHSS 1a Level of Conscious.: 0 1b LOC Questions: 0 1c LOC Commands: 0 2  Best Gaze: 0 3 Visual: 0 4 Facial Palsy: 1 5a Motor Arm - left: 2 5b Motor Arm - Right: 0 6a Motor Leg - Left: 1 6b Motor Leg - Right: 0 7 Limb Ataxia: 0 8 Sensory: 1 9 Best Language: 0 10 Dysarthria: 0 11 Extinct. and Inatten.: 0 TOTAL: 5  Labs I have reviewed labs in epic and the results pertinent to this consultation are: CBC    Component Value Date/Time   WBC 9.5 09/13/2019 1257   RBC 4.81 09/13/2019 1257   HGB 15.0 09/13/2019 1339   HCT 44.0 09/13/2019 1339   PLT 370 09/13/2019 1257   MCV 93.1 09/13/2019 1257   MCH 30.1 09/13/2019 1257   MCHC 32.4 09/13/2019 1257   RDW 12.4 09/13/2019 1257   LYMPHSABS 2.2 09/01/2017 1149   MONOABS 0.5 09/01/2017 1149   EOSABS 0.2 09/01/2017 1149   BASOSABS 0.0 09/01/2017 1149    CMP     Component Value Date/Time   NA 141 09/13/2019 1339   K 3.7 09/13/2019 1339   CL 104 09/13/2019 1257   CO2 26 09/13/2019 1257   GLUCOSE 255  (H) 09/13/2019 1257   BUN 31 (H) 09/13/2019 1257   CREATININE 0.55 09/13/2019 1257   CALCIUM 9.5 09/13/2019 1257   PROT 8.0 09/13/2019 1257   ALBUMIN 3.9 09/13/2019 1257   AST 19 09/13/2019 1257   ALT 27 09/13/2019 1257   ALKPHOS 78 09/13/2019 1257   BILITOT 0.7 09/13/2019 1257   GFRNONAA >60 09/13/2019 1257   GFRAA >60 09/13/2019 1257      Imaging I have reviewed the images obtained  CT-scan of the brain-large RMCA encephalomalaocia. Age indeterminate, fully establisehd area of infarct, seen in left frontal area. MRI confirms that the age indeterminate area in left frontal lobe is subacute stroke.  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 Imaging reveals a left frontal subacute stroke. Family was unclear why she had had the for stroke-the daughter who accompanied her was very young when she had her first stroke and does not recollect the whole set of details around that event. She does report that her mother has been getting more forgetful and has had personality changes over the past months to years.  Needs stroke work-up  Impression: Acute/subacute ischemic stroke-etiology unclear  Recommendations: Telemetry Frequent neurochecks CTA head and neck 2D echocardiogram Aspirin 325 Atorvastatin 80 Do not need to allow for permissive hypertension as the symptoms of probably now been there for about a week.  Discharge blood pressure goal normotensive. Check hemoglobin A1c Check lipid panel PT OT Speech therapy Stroke swallow screen-n.p.o. until cleared by stroke swallow screen  Stroke team to follow.  -- Amie Portland, MD [facility] Pager: [phone] If 7pm to 7am, please call on call as listed on AMION.

## 2019-09-13 NOTE — ED Notes (Signed)
Patient transported to CT 

## 2019-09-14 ENCOUNTER — Observation Stay (HOSPITAL_COMMUNITY): Payer: Medicare Other

## 2019-09-14 ENCOUNTER — Encounter (HOSPITAL_COMMUNITY): Payer: Self-pay

## 2019-09-14 ENCOUNTER — Other Ambulatory Visit: Payer: Self-pay

## 2019-09-14 DIAGNOSIS — I6389 Other cerebral infarction: Secondary | ICD-10-CM

## 2019-09-14 DIAGNOSIS — S92352A Displaced fracture of fifth metatarsal bone, left foot, initial encounter for closed fracture: Secondary | ICD-10-CM | POA: Diagnosis present

## 2019-09-14 DIAGNOSIS — R29705 NIHSS score 5: Secondary | ICD-10-CM | POA: Diagnosis present

## 2019-09-14 DIAGNOSIS — F329 Major depressive disorder, single episode, unspecified: Secondary | ICD-10-CM | POA: Diagnosis present

## 2019-09-14 DIAGNOSIS — I10 Essential (primary) hypertension: Secondary | ICD-10-CM

## 2019-09-14 DIAGNOSIS — I63513 Cerebral infarction due to unspecified occlusion or stenosis of bilateral middle cerebral arteries: Secondary | ICD-10-CM

## 2019-09-14 DIAGNOSIS — E785 Hyperlipidemia, unspecified: Secondary | ICD-10-CM

## 2019-09-14 DIAGNOSIS — Z794 Long term (current) use of insulin: Secondary | ICD-10-CM

## 2019-09-14 DIAGNOSIS — R69 Illness, unspecified: Secondary | ICD-10-CM | POA: Diagnosis not present

## 2019-09-14 DIAGNOSIS — I675 Moyamoya disease: Secondary | ICD-10-CM | POA: Diagnosis present

## 2019-09-14 DIAGNOSIS — E119 Type 2 diabetes mellitus without complications: Secondary | ICD-10-CM | POA: Diagnosis present

## 2019-09-14 DIAGNOSIS — I639 Cerebral infarction, unspecified: Secondary | ICD-10-CM | POA: Diagnosis present

## 2019-09-14 DIAGNOSIS — R609 Edema, unspecified: Secondary | ICD-10-CM | POA: Diagnosis not present

## 2019-09-14 DIAGNOSIS — I69354 Hemiplegia and hemiparesis following cerebral infarction affecting left non-dominant side: Secondary | ICD-10-CM | POA: Diagnosis not present

## 2019-09-14 DIAGNOSIS — E1165 Type 2 diabetes mellitus with hyperglycemia: Secondary | ICD-10-CM | POA: Diagnosis not present

## 2019-09-14 DIAGNOSIS — X58XXXA Exposure to other specified factors, initial encounter: Secondary | ICD-10-CM | POA: Diagnosis present

## 2019-09-14 DIAGNOSIS — R4182 Altered mental status, unspecified: Secondary | ICD-10-CM | POA: Diagnosis not present

## 2019-09-14 DIAGNOSIS — E1159 Type 2 diabetes mellitus with other circulatory complications: Secondary | ICD-10-CM

## 2019-09-14 DIAGNOSIS — E860 Dehydration: Secondary | ICD-10-CM | POA: Diagnosis present

## 2019-09-14 DIAGNOSIS — Z87891 Personal history of nicotine dependence: Secondary | ICD-10-CM | POA: Diagnosis not present

## 2019-09-14 DIAGNOSIS — J45909 Unspecified asthma, uncomplicated: Secondary | ICD-10-CM | POA: Diagnosis present

## 2019-09-14 DIAGNOSIS — G9349 Other encephalopathy: Secondary | ICD-10-CM | POA: Diagnosis present

## 2019-09-14 DIAGNOSIS — Z638 Other specified problems related to primary support group: Secondary | ICD-10-CM | POA: Diagnosis not present

## 2019-09-14 DIAGNOSIS — R410 Disorientation, unspecified: Secondary | ICD-10-CM | POA: Diagnosis not present

## 2019-09-14 DIAGNOSIS — E118 Type 2 diabetes mellitus with unspecified complications: Secondary | ICD-10-CM | POA: Diagnosis not present

## 2019-09-14 DIAGNOSIS — G9340 Encephalopathy, unspecified: Secondary | ICD-10-CM | POA: Diagnosis not present

## 2019-09-14 DIAGNOSIS — Z515 Encounter for palliative care: Secondary | ICD-10-CM | POA: Diagnosis not present

## 2019-09-14 DIAGNOSIS — I63522 Cerebral infarction due to unspecified occlusion or stenosis of left anterior cerebral artery: Secondary | ICD-10-CM | POA: Diagnosis present

## 2019-09-14 DIAGNOSIS — Z20828 Contact with and (suspected) exposure to other viral communicable diseases: Secondary | ICD-10-CM | POA: Diagnosis present

## 2019-09-14 DIAGNOSIS — M8580 Other specified disorders of bone density and structure, unspecified site: Secondary | ICD-10-CM | POA: Diagnosis present

## 2019-09-14 DIAGNOSIS — I63521 Cerebral infarction due to unspecified occlusion or stenosis of right anterior cerebral artery: Secondary | ICD-10-CM | POA: Diagnosis present

## 2019-09-14 DIAGNOSIS — Z79899 Other long term (current) drug therapy: Secondary | ICD-10-CM | POA: Diagnosis not present

## 2019-09-14 DIAGNOSIS — Z7189 Other specified counseling: Secondary | ICD-10-CM | POA: Diagnosis not present

## 2019-09-14 DIAGNOSIS — Z823 Family history of stroke: Secondary | ICD-10-CM | POA: Diagnosis not present

## 2019-09-14 DIAGNOSIS — I6523 Occlusion and stenosis of bilateral carotid arteries: Secondary | ICD-10-CM | POA: Diagnosis present

## 2019-09-14 DIAGNOSIS — I69392 Facial weakness following cerebral infarction: Secondary | ICD-10-CM | POA: Diagnosis not present

## 2019-09-14 LAB — RPR: RPR Ser Ql: NONREACTIVE

## 2019-09-14 LAB — LIPID PANEL
Cholesterol: 210 mg/dL — ABNORMAL HIGH (ref 0–200)
HDL: 35 mg/dL — ABNORMAL LOW (ref >40)
LDL Cholesterol: 145 mg/dL — ABNORMAL HIGH (ref 0–99)
Total CHOL/HDL Ratio: 6 RATIO
Triglycerides: 152 mg/dL — ABNORMAL HIGH (ref <150)
VLDL: 30 mg/dL (ref 0–40)

## 2019-09-14 LAB — BASIC METABOLIC PANEL
Anion gap: 10 (ref 5–15)
BUN: 29 mg/dL — ABNORMAL HIGH (ref 6–20)
CO2: 24 mmol/L (ref 22–32)
Calcium: 8.7 mg/dL — ABNORMAL LOW (ref 8.9–10.3)
Chloride: 104 mmol/L (ref 98–111)
Creatinine, Ser: 0.6 mg/dL (ref 0.44–1.00)
GFR calc Af Amer: >60 mL/min (ref >60)
GFR calc non Af Amer: >60 mL/min (ref >60)
Glucose, Bld: 296 mg/dL — ABNORMAL HIGH (ref 70–99)
Potassium: 3.6 mmol/L (ref 3.5–5.1)
Sodium: 138 mmol/L (ref 135–145)

## 2019-09-14 LAB — CBC
HCT: 39.7 % (ref 36.0–46.0)
Hemoglobin: 13.1 g/dL (ref 12.0–15.0)
MCH: 30.3 pg (ref 26.0–34.0)
MCHC: 33 g/dL (ref 30.0–36.0)
MCV: 91.9 fL (ref 80.0–100.0)
Platelets: 331 10*3/uL (ref 150–400)
RBC: 4.32 MIL/uL (ref 3.87–5.11)
RDW: 12.2 % (ref 11.5–15.5)
WBC: 10.3 10*3/uL (ref 4.0–10.5)
nRBC: 0 % (ref 0.0–0.2)

## 2019-09-14 LAB — VITAMIN B12: Vitamin B-12: 633 pg/mL (ref 180–914)

## 2019-09-14 LAB — GLUCOSE, CAPILLARY
Glucose-Capillary: 268 mg/dL — ABNORMAL HIGH (ref 70–99)
Glucose-Capillary: 295 mg/dL — ABNORMAL HIGH (ref 70–99)
Glucose-Capillary: 214 mg/dL — ABNORMAL HIGH (ref 70–99)
Glucose-Capillary: 223 mg/dL — ABNORMAL HIGH (ref 70–99)

## 2019-09-14 LAB — SEDIMENTATION RATE: Sed Rate: 41 mm/hr — ABNORMAL HIGH (ref 0–22)

## 2019-09-14 LAB — ECHOCARDIOGRAM COMPLETE

## 2019-09-14 LAB — URINE CULTURE

## 2019-09-14 LAB — C-REACTIVE PROTEIN: CRP: <0.8 mg/dL (ref <1.0)

## 2019-09-14 LAB — URIC ACID: Uric Acid, Serum: 4.9 mg/dL (ref 2.5–7.1)

## 2019-09-14 MED ORDER — ENOXAPARIN SODIUM 40 MG/0.4ML ~~LOC~~ SOLN
40.0000 mg | Freq: Every day | SUBCUTANEOUS | Status: DC
  Administered 2019-09-16 – 2019-09-19 (×4): 40 mg via SUBCUTANEOUS
  Filled 2019-09-14 (×4): qty 0.4

## 2019-09-14 MED ORDER — INSULIN ASPART 100 UNIT/ML ~~LOC~~ SOLN
0.0000 [IU] | Freq: Three times a day (TID) | SUBCUTANEOUS | Status: DC
  Administered 2019-09-14: 8 [IU] via SUBCUTANEOUS
  Administered 2019-09-14: 5 [IU] via SUBCUTANEOUS

## 2019-09-14 MED ORDER — INSULIN GLARGINE 100 UNIT/ML ~~LOC~~ SOLN
10.0000 [IU] | Freq: Every day | SUBCUTANEOUS | Status: DC
  Administered 2019-09-14: 10 [IU] via SUBCUTANEOUS
  Filled 2019-09-14 (×2): qty 0.1

## 2019-09-14 MED ORDER — ASPIRIN EC 325 MG PO TBEC
325.0000 mg | DELAYED_RELEASE_TABLET | Freq: Every day | ORAL | Status: DC
  Administered 2019-09-15 – 2019-09-19 (×5): 325 mg via ORAL
  Filled 2019-09-14 (×5): qty 1

## 2019-09-14 MED ORDER — CLOPIDOGREL BISULFATE 75 MG PO TABS
75.0000 mg | ORAL_TABLET | Freq: Every day | ORAL | Status: DC
  Administered 2019-09-14 – 2019-09-19 (×6): 75 mg via ORAL
  Filled 2019-09-14 (×6): qty 1

## 2019-09-14 NOTE — Progress Notes (Signed)
Inpatient Diabetes Program Recommendations  AACE/ADA: New Consensus Statement on Inpatient Glycemic Control (2015)  Target Ranges:  Prepandial:   less than 140 mg/dL      Peak postprandial:   less than 180 mg/dL (1-2 hours)      Critically ill patients:  140 - 180 mg/dL   Lab Results  Component Value Date   GLUCAP 295 (H) 09/14/2019   HGBA1C 9.8 (H) 09/13/2019  Results for Christina Evans, Christina Evans (MRN [id number]) as of 09/14/2019 14:48  Ref. Range 09/13/2019 12:54 09/14/2019 07:37 09/14/2019 11:18  Glucose-Capillary Latest Ref Range: 70 - 99 mg/dL 265 (H) 268 (H) 295 (H)    Review of Glycemic Control  Diabetes history: type 2 Outpatient Diabetes medications: Lantus 50 units BID, Novolog 15 U TID, Metformin Current orders for Inpatient glycemic control: Lantus 10 units daily, Novolog MODERATE correction scale TID.  Inpatient Diabetes Program Recommendations:   Noted that patient's blood sugars have been in the 200's.  Recommend increasing Lantus to 15 units daily (weight based with 0.2 units/kg) if blood sugars continue to be greater than 180 mg/dl. Will continue to monitor blood sugars while in the hospital.  Harvel Ricks RN BSN CDE Diabetes Coordinator Pager: [phone]  8am-5pm

## 2019-09-14 NOTE — Progress Notes (Signed)
Orthopedic Tech Progress Note Patient Details:  Christina Evans 1961-08-03 [id number]  Ortho Devices Type of Ortho Device: Postop shoe/boot Ortho Device/Splint Location: LLE Ortho Device/Splint Interventions: Ordered, Application   Post Interventions Patient Tolerated: Well Instructions Provided: Care of device   Braulio Bosch 09/14/2019, 3:09 PM

## 2019-09-14 NOTE — Evaluation (Addendum)
Physical Therapy Evaluation Patient Details Name: Christina Evans MRN: [id number] DOB: 1961-10-24 Today's Date: 09/14/2019   History of Present Illness  Pt is a 58 y.o. F with significant PMH of stroke, insulin dependent diabetes, hyperlipidemia, hypertension. Presents with altered mental status. EEG 10/22 suggestive of mild diffuse encephalopathy and no seizures. MRI brain with moderate sized acute to early subacute left ACA territory infarct and large chronic right MCA territory infarct. Of note, foot x-ray showing acute minimally dispolaced fracture through base of left fifth metatarsal.    Clinical Impression  Pt admitted with above. Pt presents with decreased cognition including decreased orientation, awareness, attention, and problem solving abilities. She also has residual left sided deficits from prior stroke. On PT evaluation, pt performing bed mobility at a min assist level. Pt unable to initiate and/or execute standing after ~25 minutes of sitting edge of bed despite having the strength to do so. Pt with significant difficulty sequencing or problem solving how to stand and actively resisting efforts/ assist by PT/OT. She is highly distractable and constantly fidgeting with IV line. Pt states she "doesn't know how to stand." Based on deficits and lack of consistent caregiver support, recommending SNF to maximize functional independence.   Interpreter Rains assisted with session.    Follow Up Recommendations SNF;Supervision/Assistance - 24 hour    Equipment Recommendations  Other (comment)(TBA)    Recommendations for Other Services       Precautions / Restrictions Precautions Precautions: Fall Restrictions Weight Bearing Restrictions: No      Mobility  Bed Mobility Overal bed mobility: Needs Assistance Bed Mobility: Supine to Sit;Sit to Supine     Supine to sit: Min assist Sit to supine: Min guard   General bed mobility comments: Supine <> sit x 2 to both sides of  bed, pt requiring min assist for sitting up, close min guard for lying back down.  Transfers                 General transfer comment: unable, limited by decreased cognition  Ambulation/Gait                Stairs            Wheelchair Mobility    Modified Rankin (Stroke Patients Only)       Balance Overall balance assessment: Needs assistance Sitting-balance support: Feet supported Sitting balance-Leahy Scale: Good                                       Pertinent Vitals/Pain Pain Assessment: Faces Faces Pain Scale: Hurts a little bit Pain Location: left dorsal foot tender to palpation Pain Descriptors / Indicators: Tender;Grimacing Pain Intervention(s): Monitored during session    Home Living Family/patient expects to be discharged to:: Private residence Living Arrangements: Other relatives Available Help at Discharge: Friend(s);Available PRN/intermittently Type of Home: House Home Access: Level entry     Home Layout: One level Home Equipment: None      Prior Function Level of Independence: Independent         Comments: Pt reports that she manages own medications. No falls recently. L sided weakness from previous CVA     Hand Dominance   Dominant Hand: Right    Extremity/Trunk Assessment   Upper Extremity Assessment Upper Extremity Assessment: Defer to OT evaluation    Lower Extremity Assessment Lower Extremity Assessment: Generalized weakness       Communication  Communication: Prefers language other than English  Cognition Arousal/Alertness: Awake/alert Behavior During Therapy: WFL for tasks assessed/performed Overall Cognitive Status: Impaired/Different from baseline Area of Impairment: Attention;Orientation;Following commands;Safety/judgement;Awareness;Problem solving                 Orientation Level: Disoriented to;Place;Situation Current Attention Level: Focused;Sustained   Following  Commands: Follows one step commands inconsistently Safety/Judgement: Decreased awareness of deficits Awareness: Intellectual Problem Solving: Slow processing;Decreased initiation;Difficulty sequencing;Requires verbal cues;Requires tactile cues General Comments: Pt highly distractable, constantly fidgeting with IV lines, gown, bed sheets. Often says things out of context to task, like "look at the smoke outside," or "you have the same shoes on." Is able to verbalize task that needs to be completed i.e. standing, but unable to initiate or execute. With attempts to help, pt stating "wait, wait, hold on;" and then just stares into space with no attempts to replicate task at hand. Pt stating, "she doesn't know how to stand," despite multimodal cueing including demonstrating.       General Comments      Exercises     Assessment/Plan    PT Assessment Patient needs continued PT services  PT Problem List Decreased strength;Decreased range of motion;Decreased balance;Decreased mobility;Decreased cognition;Decreased safety awareness       PT Treatment Interventions Gait training;Stair training;Functional mobility training;Therapeutic activities;Balance training;Therapeutic exercise;Patient/family education;DME instruction    PT Goals (Current goals can be found in the Care Plan section)  Acute Rehab PT Goals Patient Stated Goal: none stated PT Goal Formulation: With patient Time For Goal Achievement: 09/28/19 Potential to Achieve Goals: Fair    Frequency Min 2X/week   Barriers to discharge        Co-evaluation PT/OT/SLP Co-Evaluation/Treatment: Yes Reason for Co-Treatment: Necessary to address cognition/behavior during functional activity PT goals addressed during session: Mobility/safety with mobility         AM-PAC PT "6 Clicks" Mobility  Outcome Measure Help needed turning from your back to your side while in a flat bed without using bedrails?: None Help needed moving from lying  on your back to sitting on the side of a flat bed without using bedrails?: A Little Help needed moving to and from a bed to a chair (including a wheelchair)?: A Lot Help needed standing up from a chair using your arms (e.g., wheelchair or bedside chair)?: A Lot Help needed to walk in hospital room?: A Lot Help needed climbing 3-5 steps with a railing? : A Lot 6 Click Score: 15    End of Session   Activity Tolerance: Other (comment)(limited by decreased cognition) Patient left: in bed;with call bell/phone within reach;with bed alarm set Nurse Communication: Mobility status PT Visit Diagnosis: Muscle weakness (generalized) (M62.81)    Time: 3419-6222 PT Time Calculation (min) (ACUTE ONLY): 30 min   Charges:   PT Evaluation $PT Eval Moderate Complexity: 1 Mod          Laurina Bustle, PT, DPT Acute Rehabilitation Services Pager [phone] Office [phone]   Vanetta Mulders 09/14/2019, 11:21 AM

## 2019-09-14 NOTE — Progress Notes (Signed)
[facility] swallow screen test was passed on 2019-09-14 at 03-08-2039.

## 2019-09-14 NOTE — Progress Notes (Signed)
Bilateral lower extremity venous duplex complete. Please see CV proc tab for preliminary results. Lita Mains- RDMS, RVT 2:00 PM  09/14/2019

## 2019-09-14 NOTE — Consult Note (Signed)
Reason for Consult:Left 5th MT fx Referring Physician: W Nguyet Christina Evans is an 58 y.o. female.  HPI: Christina Evans was admitted yesterday with AMS. During the course of the admission the pt's daughter noted that she had been having some tenderness of her left foot. An x-ray was obtained that showed a 5th MT fx and orthopedic surgery was consulted. The pt denies any known trauma or tenderness or pain in her foot.  Past Medical History:  Diagnosis Date  . Asthma   . CVA (cerebral vascular accident) (HCC)   . Diabetes mellitus without complication [facility])     Past Surgical History:  Procedure Laterality Date  . NO PAST SURGERIES      No family history on file.  Social History:  reports that she has quit smoking. She has never used smokeless tobacco. She reports current alcohol use. She reports that she does not use drugs.  Allergies:  Allergies  Allergen Reactions  . Tape Itching and Other (See Comments)    Causes a lot of scratching, also    Medications: I have reviewed the patient's current medications.  Results for orders placed or performed during the hospital encounter of 09/13/19 (from the past 48 hour(s))  CBG monitoring, ED     Status: Abnormal   Collection Time: 09/13/19 12:54 PM  Result Value Ref Range   Glucose-Capillary 265 (H) 70 - 99 mg/dL   Comment 1 Notify RN    Comment 2 Document in Chart   Comprehensive metabolic panel     Status: Abnormal   Collection Time: 09/13/19 12:57 PM  Result Value Ref Range   Sodium 142 135 - 145 mmol/L   Potassium 3.8 3.5 - 5.1 mmol/L   Chloride 104 98 - 111 mmol/L   CO2 26 22 - 32 mmol/L   Glucose, Bld 255 (H) 70 - 99 mg/dL   BUN 31 (H) 6 - 20 mg/dL   Creatinine, Ser 1.61 0.44 - 1.00 mg/dL   Calcium 9.5 8.9 - 09.6 mg/dL   Total Protein 8.0 6.5 - 8.1 g/dL   Albumin 3.9 3.5 - 5.0 g/dL   AST 19 15 - 41 U/L   ALT 27 0 - 44 U/L   Alkaline Phosphatase 78 38 - 126 U/L   Total Bilirubin 0.7 0.3 - 1.2 mg/dL   GFR calc non Af  Amer >60 >60 mL/min   GFR calc Af Amer >60 >60 mL/min   Anion gap 12 5 - 15    Comment: Performed at [facility] Lab, [address], [place], [place] [address]  CBC     Status: None   Collection Time: 09/13/19 12:57 PM  Result Value Ref Range   WBC 9.5 4.0 - 10.5 K/uL   RBC 4.81 3.87 - 5.11 MIL/uL   Hemoglobin 14.5 12.0 - 15.0 g/dL   HCT 98.1 19.1 - 47.8 %   MCV 93.1 80.0 - 100.0 fL   MCH 30.1 26.0 - 34.0 pg   MCHC 32.4 30.0 - 36.0 g/dL   RDW 29.5 62.1 - 30.8 %   Platelets 370 150 - 400 K/uL   nRBC 0.0 0.0 - 0.2 %    Comment: Performed at [facility] Lab, [address], [place], [place] [address]  Ethanol     Status: None   Collection Time: 09/13/19  1:17 PM  Result Value Ref Range   Alcohol, Ethyl (B) <10 <10 mg/dL    Comment: (NOTE) Lowest detectable limit for serum alcohol is  10 mg/dL. For medical purposes only. Performed at [facility] Lab, [address], [place], [place] [address]   Lipase, blood     Status: None   Collection Time: 09/13/19  1:17 PM  Result Value Ref Range   Lipase 32 11 - 51 U/L    Comment: Performed at [facility], [address] [place], [place] [address]  Troponin I (High Sensitivity)     Status: None   Collection Time: 09/13/19  1:17 PM  Result Value Ref Range   Troponin I (High Sensitivity) 3 <18 ng/L    Comment: (NOTE) Elevated high sensitivity troponin I (hsTnI) values and significant  changes across serial measurements may suggest ACS but many other  chronic and acute conditions are known to elevate hsTnI results.  Refer to the "Links" section for chest pain algorithms and additional  guidance. Performed at [facility] Lab, [address], [place], [place] [address]   Protime-INR     Status: None   Collection Time: 09/13/19  1:17 PM  Result Value Ref Range   Prothrombin Time 12.4 11.4 - 15.2 seconds   INR 0.9 0.8 - 1.2    Comment: (NOTE) INR goal varies based on device and disease states. Performed at [facility] Lab, [address], [place], [place] [address]   CK     Status: None   Collection Time: 09/13/19  1:17 PM  Result Value Ref Range   Total CK 220 38 - 234 U/L    Comment: Performed at [facility] Lab, [address], [place], [place] [address]  Magnesium     Status: None   Collection Time: 09/13/19  1:17 PM  Result Value Ref Range   Magnesium 2.2 1.7 - 2.4 mg/dL    Comment: Performed at [facility] Lab, [address], [place], [place] [address]  Phosphorus     Status: None   Collection Time: 09/13/19  1:17 PM  Result Value Ref Range   Phosphorus 4.0 2.5 - 4.6 mg/dL    Comment: Performed at [facility] Lab, [address] [place], [place] [address]  POCT I-Stat EG7     Status: Abnormal   Collection Time: 09/13/19  1:39 PM  Result Value Ref Range   pH, Ven 7.364 7.250 - 7.430   pCO2, Ven 49.6 44.0 - 60.0 mmHg   pO2, Ven 40.0 32.0 - 45.0 mmHg   Bicarbonate 28.3 (H) 20.0 - 28.0 mmol/L   TCO2 30 22 - 32 mmol/L   O2 Saturation 73.0 %   Acid-Base Excess 2.0 0.0 - 2.0 mmol/L   Sodium 141 135 - 145 mmol/L   Potassium 3.7 3.5 - 5.1 mmol/L   Calcium, Ion 1.16 1.15 - 1.40 mmol/L   HCT 44.0 36.0 - 46.0 %   Hemoglobin 15.0 12.0 - 15.0 g/dL   Patient temperature HIDE    Sample type VENOUS   I-Stat beta hCG blood, ED     Status: None   Collection Time: 09/13/19  2:01 PM  Result Value Ref Range   I-stat hCG, quantitative <5.0 <5 mIU/mL   Comment 3            Comment:   GEST. AGE      CONC.  (mIU/mL)   <=1 WEEK        5 - 50     2 WEEKS       50 - 500     3 WEEKS  100 - 10,000     4 WEEKS     1,000 - 30,000        FEMALE AND NON-PREGNANT FEMALE:     LESS THAN 5 mIU/mL   Culture, blood (routine x 2)     Status: None (Preliminary result)   Collection Time: 09/13/19  2:08 PM   Specimen: BLOOD  Result Value Ref Range   Specimen Description BLOOD RIGHT ANTECUBITAL    Special Requests      BOTTLES DRAWN AEROBIC AND ANAEROBIC Blood Culture results may not be  optimal due to an inadequate volume of blood received in culture bottles   Culture      NO GROWTH < 24 HOURS Performed at [facility] [place] [address] [place], [place] [address]    Report Status PENDING   Lactic acid, plasma     Status: None   Collection Time: 09/13/19  2:10 PM  Result Value Ref Range   Lactic Acid, Venous 1.5 0.5 - 1.9 mmol/L    Comment: Performed at [facility] Lab, [place] [address], [place], [place] [address]  Urinalysis, Routine w reflex microscopic     Status: Abnormal   Collection Time: 09/13/19  2:10 PM  Result Value Ref Range   Color, Urine YELLOW YELLOW   APPearance HAZY (A) CLEAR   Specific Gravity, Urine 1.011 1.005 - 1.030   pH 6.0 5.0 - 8.0   Glucose, UA NEGATIVE NEGATIVE mg/dL   Hgb urine dipstick NEGATIVE NEGATIVE   Bilirubin Urine NEGATIVE NEGATIVE   Ketones, ur NEGATIVE NEGATIVE mg/dL   Protein, ur NEGATIVE NEGATIVE mg/dL   Nitrite NEGATIVE NEGATIVE   Leukocytes,Ua SMALL (A) NEGATIVE   RBC / HPF 0-5 0 - 5 RBC/hpf   WBC, UA 11-20 0 - 5 WBC/hpf   Bacteria, UA RARE (A) NONE SEEN   Squamous Epithelial / LPF 6-10 0 - 5   Mucus PRESENT     Comment: Performed at [facility], [address], [place], [place] [address]  Culture, blood (routine x 2)     Status: None (Preliminary result)   Collection Time: 09/13/19  2:10 PM   Specimen: BLOOD RIGHT WRIST  Result Value Ref Range   Specimen Description BLOOD RIGHT WRIST    Special Requests      BOTTLES DRAWN AEROBIC ONLY Blood Culture results may not be optimal due to an inadequate volume of blood received in culture bottles   Culture      NO GROWTH < 24 HOURS Performed at [place] [address], [place], [place] [address]    Report Status PENDING   Ammonia     Status: None   Collection Time: 09/13/19  2:10 PM  Result Value Ref Range   Ammonia 12 9 - 35 umol/L    Comment: Performed at [facility], [place] [address], [place], [place] [address]  TSH     Status: None    Collection Time: 09/13/19  2:10 PM  Result Value Ref Range   TSH 1.083 0.350 - 4.500 uIU/mL    Comment: Performed by a 3rd Generation assay with a functional sensitivity of <=0.01 uIU/mL. Performed at [facility], [place] [address], [place], [place] [address]   Urinalysis, Routine w reflex microscopic     Status: Abnormal   Collection Time: 09/13/19  2:10 PM  Result Value Ref Range   Color, Urine YELLOW YELLOW   APPearance HAZY (A) CLEAR   Specific  Gravity, Urine 1.035 (H) 1.005 - 1.030   pH 5.0 5.0 - 8.0   Glucose, UA >=500 (A) NEGATIVE mg/dL   Hgb urine dipstick NEGATIVE NEGATIVE   Bilirubin Urine NEGATIVE NEGATIVE   Ketones, ur NEGATIVE NEGATIVE mg/dL   Protein, ur NEGATIVE NEGATIVE mg/dL   Nitrite NEGATIVE NEGATIVE   Leukocytes,Ua NEGATIVE NEGATIVE   RBC / HPF 0-5 0 - 5 RBC/hpf   WBC, UA 0-5 0 - 5 WBC/hpf   Bacteria, UA RARE (A) NONE SEEN   Squamous Epithelial / LPF 11-20 0 - 5   Mucus PRESENT     Comment: Performed at [facility], [address] [place], [place] [address]  Urine culture     Status: Abnormal   Collection Time: 09/13/19  3:35 PM   Specimen: Urine, Clean Catch  Result Value Ref Range   Specimen Description URINE, CLEAN CATCH    Special Requests      NONE Performed at [facility], [address] [place], [place] [address]    Culture MULTIPLE SPECIES PRESENT, SUGGEST RECOLLECTION (A)    Report Status 09/14/2019 FINAL   SARS CORONAVIRUS 2 (TAT 6-24 HRS) Nasopharyngeal Nasopharyngeal Swab     Status: None   Collection Time: 09/13/19  4:13 PM   Specimen: Nasopharyngeal Swab  Result Value Ref Range   SARS Coronavirus 2 NEGATIVE NEGATIVE    Comment: (NOTE) SARS-CoV-2 target nucleic acids are NOT DETECTED. The SARS-CoV-2 RNA is generally detectable in upper and lower respiratory specimens during the acute phase of infection. Negative results do not preclude SARS-CoV-2 infection, do not rule out co-infections with other pathogens, and  should not be used as the sole basis for treatment or other patient management decisions. Negative results must be combined with clinical observations, patient history, and epidemiological information. The expected result is Negative. Fact Sheet for Patients: HairSlick.no Fact Sheet for Healthcare Providers: quierodirigir.com This test is not yet approved or cleared by the [place] FDA and  has been authorized for detection and/or diagnosis of SARS-CoV-2 by FDA under an Emergency Use Authorization (EUA). This EUA will remain  in effect (meaning this test can be used) for the duration of the COVID-19 declaration under Section 56 4(b)(1) of the Act, 21 U.S.C. section 360bbb-3(b)(1), unless the authorization is terminated or revoked sooner. Performed at [facility], [address], [place], [place] [address]   HIV Antibody (routine testing w rflx)     Status: None   Collection Time: 09/13/19  6:04 PM  Result Value Ref Range   HIV Screen 4th Generation wRfx NON REACTIVE NON REACTIVE    Comment: Performed at [facility], [address] [place], [place] [address]  TSH     Status: None   Collection Time: 09/13/19  6:05 PM  Result Value Ref Range   TSH 1.423 0.350 - 4.500 uIU/mL    Comment: Performed by a 3rd Generation assay with a functional sensitivity of <=0.01 uIU/mL. Performed at [facility] Lab, [address] [place], [place] [address]   Troponin I (High Sensitivity)     Status: None   Collection Time: 09/13/19  6:40 PM  Result Value Ref Range   Troponin I (High Sensitivity) 3 <18 ng/L    Comment: (NOTE) Elevated high sensitivity troponin I (hsTnI) values and significant  changes across serial measurements may suggest ACS but many other  chronic and acute conditions are known to elevate hsTnI results.  Refer to the "Links"  section for chest pain algorithms and additional  guidance. Performed at  [facility], [address], [place], [place] [address]   Hemoglobin A1c     Status: Abnormal   Collection Time: 09/13/19  6:42 PM  Result Value Ref Range   Hgb A1c MFr Bld 9.8 (H) 4.8 - 5.6 %    Comment: (NOTE) Pre diabetes:          5.7%-6.4% Diabetes:              >6.4% Glycemic control for   <7.0% adults with diabetes    Mean Plasma Glucose 234.56 mg/dL    Comment: Performed at [facility] Lab, [address] [place], [place] [address]  Rapid urine drug screen (hospital performed)     Status: None   Collection Time: 09/13/19  6:45 PM  Result Value Ref Range   Opiates NONE DETECTED NONE DETECTED   Cocaine NONE DETECTED NONE DETECTED   Benzodiazepines NONE DETECTED NONE DETECTED   Amphetamines NONE DETECTED NONE DETECTED   Tetrahydrocannabinol NONE DETECTED NONE DETECTED   Barbiturates NONE DETECTED NONE DETECTED    Comment: (NOTE) DRUG SCREEN FOR MEDICAL PURPOSES ONLY.  IF CONFIRMATION IS NEEDED FOR ANY PURPOSE, NOTIFY LAB WITHIN 5 DAYS. LOWEST DETECTABLE LIMITS FOR URINE DRUG SCREEN Drug Class                     Cutoff (ng/mL) Amphetamine and metabolites    1000 Barbiturate and metabolites    200 Benzodiazepine                 200 Tricyclics and metabolites     300 Opiates and metabolites        300 Cocaine and metabolites        300 THC                            50 Performed at [facility] Lab, [address] [place], [place] [address]   Basic metabolic panel     Status: Abnormal   Collection Time: 09/14/19  2:27 AM  Result Value Ref Range   Sodium 138 135 - 145 mmol/L   Potassium 3.6 3.5 - 5.1 mmol/L   Chloride 104 98 - 111 mmol/L   CO2 24 22 - 32 mmol/L   Glucose, Bld 296 (H) 70 - 99 mg/dL   BUN 29 (H) 6 - 20 mg/dL   Creatinine, Ser 3.08 0.44 - 1.00 mg/dL   Calcium 8.7 (L) 8.9 - 10.3 mg/dL   GFR calc non Af Amer >60 >60 mL/min   GFR calc Af Amer >60 >60 mL/min   Anion gap 10 5 - 15    Comment: Performed at [facility] Lab, [address], [place], [place] [address]  CBC     Status: None   Collection Time: 09/14/19  2:27 AM  Result Value Ref Range   WBC 10.3 4.0 - 10.5 K/uL   RBC 4.32 3.87 - 5.11 MIL/uL   Hemoglobin 13.1 12.0 - 15.0 g/dL   HCT 69.6 29.5 - 28.4 %   MCV 91.9 80.0 - 100.0 fL   MCH 30.3 26.0 - 34.0 pg   MCHC 33.0 30.0 - 36.0 g/dL   RDW 13.2 44.0 - 10.2 %   Platelets 331 150 - 400 K/uL   nRBC 0.0 0.0 - 0.2 %    Comment: Performed at [facility],  [address], [place], [place] [address]  Lipid panel     Status: Abnormal   Collection Time: 09/14/19  2:27 AM  Result Value Ref Range   Cholesterol 210 (H) 0 - 200 mg/dL   Triglycerides 604 (H) <150 mg/dL   HDL 35 (L) >54 mg/dL   Total CHOL/HDL Ratio 6.0 RATIO   VLDL 30 0 - 40 mg/dL   LDL Cholesterol 098 (H) 0 - 99 mg/dL    Comment:        Total Cholesterol/HDL:CHD Risk Coronary Heart Disease Risk Table                     Men   Women  1/2 Average Risk   3.4   3.3  Average Risk       5.0   4.4  2 X Average Risk   9.6   7.1  3 X Average Risk  23.4   11.0        Use the calculated Patient Ratio above and the CHD Risk Table to determine the patient's CHD Risk.        ATP III CLASSIFICATION (LDL):  <100     mg/dL   Optimal  119-147  mg/dL   Near or Above                    Optimal  130-159  mg/dL   Borderline  829-562  mg/dL   High  >130     mg/dL   Very High Performed at [facility] Lab, [address], [place], [place] [address]   Uric acid     Status: None   Collection Time: 09/14/19  2:27 AM  Result Value Ref Range   Uric Acid, Serum 4.9 2.5 - 7.1 mg/dL    Comment: Performed at [facility], [address], [place], [place] [address]  C-reactive protein     Status: None   Collection Time: 09/14/19  2:27 AM  Result Value Ref Range   CRP <0.8 <1.0 mg/dL    Comment: Performed at [facility], [address] [place], [place] [address]  Sedimentation rate     Status: Abnormal   Collection Time: 09/14/19  2:27 AM   Result Value Ref Range   Sed Rate 41 (H) 0 - 22 mm/hr    Comment: Performed at [facility] Lab, [address], [place], [place] [address]  Vitamin B12     Status: None   Collection Time: 09/14/19  2:27 AM  Result Value Ref Range   Vitamin B-12 633 180 - 914 pg/mL    Comment: (NOTE) This assay is not validated for testing neonatal or myeloproliferative syndrome specimens for Vitamin B12 levels. Performed at [facility] Lab, [address] [place], [place] [address]   RPR     Status: None   Collection Time: 09/14/19  2:27 AM  Result Value Ref Range   RPR Ser Ql NON REACTIVE NON REACTIVE    Comment: Performed at [facility] Lab, [address] [place], [place] [address]  Glucose, capillary     Status: Abnormal   Collection Time: 09/14/19  7:37 AM  Result Value Ref Range   Glucose-Capillary 268 (H) 70 - 99 mg/dL  Glucose, capillary     Status: Abnormal   Collection Time: 09/14/19 11:18 AM  Result Value Ref Range   Glucose-Capillary 295 (H) 70 - 99 mg/dL    Ct Angio  Head W Or Wo Contrast  Result Date: 09/13/2019 CLINICAL DATA:  Initial evaluation for acute altered mental status. Known acute to subacute left frontal stroke. EXAM: CT ANGIOGRAPHY HEAD AND NECK TECHNIQUE: Multidetector CT imaging of the head and neck was performed using the standard protocol during bolus administration of intravenous contrast. Multiplanar CT image reconstructions and MIPs were obtained to evaluate the vascular anatomy. Carotid stenosis measurements (when applicable) are obtained utilizing NASCET criteria, using the distal internal carotid diameter as the denominator. CONTRAST:  24mL OMNIPAQUE IOHEXOL 350 MG/ML SOLN COMPARISON:  Prior head CT and MRI from earlier the same day. FINDINGS: CTA NECK FINDINGS Aortic arch: Visualized aortic arch normal in caliber without aneurysm or other acute finding. Bovine arch with common origin of the right brachiocephalic and left common carotid artery noted. No  hemodynamically significant stenosis seen about the origin of the great vessels. Visualized subclavian arteries patent without stenosis. Right carotid system: Right CCA patent from its origin to the bifurcation without stenosis. Eccentric soft plaque at the origin of the right ICA with mild 35% stenosis by NASCET criteria. Right ICA diminutive but otherwise patent to the skull base without additional stenosis or occlusion. Left carotid system: Left CCA patent from its origin to the bifurcation without stenosis. No significant atheromatous narrowing about the left bifurcation. Left ICA also diffusely diminutive but patent to the skull base without additional stenosis or occlusion. Vertebral arteries: Both vertebral arteries arise from the subclavian arteries. Left vertebral artery dominant and widely patent within the neck without stenosis, dissection, or occlusion. Right vertebral artery diffusely hypoplastic and is occluded at its origin, and remains largely occluded within the neck. Minimal scant distal reconstitution within the right V2 segment, which subsequently re occludes at the V3 segment. Right vertebral is occluded as it courses into the cranial vault. Skeleton: No acute osseous abnormality. No discrete lytic or blastic osseous lesions. Moderate cervical spondylosis at C5-6. Other neck: No other acute soft tissue abnormality within the neck. Salivary glands within normal limits. Thyroid normal. Enlarged left level IB node measures 12 mm in short axis (series 5, image 100). Additional mildly enlarged 11 mm right submental node (series 5, image 108). Findings are indeterminate, but could be reactive, although no acute inflammatory changes are seen within the neck. Upper chest: Visualized upper chest demonstrates no acute finding. Partially visualized lungs are grossly clear. Review of the MIP images confirms the above findings CTA HEAD FINDINGS Anterior circulation: ICAs markedly diminutive bilaterally, but  patent as they course into the cranial vault. Petrous segments widely patent. Scattered atheromatous plaque within the cavernous/supraclinoid ICAs bilaterally. Associated moderate stenosis at the anterior genu of the cavernous left ICA. There is a severe near occlusive stenosis at the para clinoid right ICA (series 7, image 28). There is severe near occlusive stenosis at the right ICA terminus (series 7, image 22). Right A1 segment patent. Left ICA terminus is perfused. Occlusion of the left A1 segment at its origin, with some retrograde filling across the circle-of-Willis. Grossly normal anterior communicating artery complex. There is occlusion of the distal right A2 segment (series 7, image 98). Left ACA demonstrates multifocal severe stenoses and demonstrates markedly attenuated flow, although does remain patent to its distal aspect. Severe fairly long segment stenosis seen involving the left M1 segment (series 7, image 25). Left MCA remains severely stenotic nearly to the level of the bifurcation, with only faintly visible flow. Markedly attenuated flow is seen distally within the left MCA branches, which also demonstrate small  vessel atheromatous irregularity. On the right, the severe stenosis involving the right ICA terminus extends into the proximal-mid right M1 segment which demonstrates markedly diminutive flow. Distal right M1 segment patent. Negative right MCA bifurcation. Distal right MCA branches are perfused, although demonstrates diffuse small vessel atheromatous irregularity. Posterior circulation: Dominant left vertebral artery demonstrates scattered atheromatous irregularity but is patent to the vertebrobasilar junction without stenosis. Right vertebral occluded as it courses into the cranial vault. Retrograde filling of the distal right V4 segment two perfuse the right PICA. Left PICA not seen. Basilar patent to its distal aspect without stenosis. Superior cerebral arteries patent bilaterally.  Both of the posterior cerebral arteries primarily supplied via the basilar. Left PCA perfused to its distal aspect without stenosis. Short-segment severe distal right P2 stenosis noted (series 10, image 25). Venous sinuses: Grossly patent, although not well assessed due to timing of the contrast bolus. Anatomic variants: None significant. Review of the MIP images confirms the above findings IMPRESSION: 1. 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 Secondary severely attenuated flow throughout both MCA distributions, left worse than right. 2. Occluded right A2 segment. Left ACA severely diseased and attenuated but remains patent. 3. Occluded left A1 segment. 4. Hypoplastic and occluded right vertebral artery. Dominant left vertebral artery patent without significant stenosis. Retrograde filling of the distal right V4 segment with perfusion of the right PICA. 5. Severe distal right P2 stenosis. 6. Otherwise relative patency of the major arterial vasculature of the neck, although the ICAs are diffusely diminutive and hypoplastic bilaterally. 7. Mildly enlarged level I and left level II lymph nodes as above, indeterminate, but could be reactive. Electronically Signed   By: Rise MuBenjamin  McClintock M.D.   On: 09/13/2019 23:24   Dg Chest 2 View  Result Date: 09/13/2019 CLINICAL DATA:  Initial evaluation for acute altered mental status, history of asthma, former smoker. EXAM: CHEST - 2 VIEW COMPARISON:  Prior radiograph from 09/01/2017. FINDINGS: Mild cardiomegaly, stable. Mediastinal silhouette within normal limits. Lungs hypoinflated. Mild diffuse pulmonary vascular congestion with interstitial prominence, suggesting pulmonary interstitial congestion. No frank pulmonary edema. No pleural effusion. No focal infiltrates. No pneumothorax. No acute osseous finding. IMPRESSION: 1. Cardiomegaly with mild  diffuse pulmonary interstitial congestion without frank pulmonary edema. 2. No other active cardiopulmonary disease. Electronically Signed   By: Rise MuBenjamin  McClintock M.D.   On: 09/13/2019 20:23   Ct Head Wo Contrast  Result Date: 09/13/2019 CLINICAL DATA:  58 year old female with altered mental status. EXAM: CT HEAD WITHOUT CONTRAST TECHNIQUE: Contiguous axial images were obtained from the base of the skull through the vertex without intravenous contrast. COMPARISON:  None. FINDINGS: Brain: There is mild age-related atrophy. Large area of old infarct and encephalomalacia noted in the right MCA territory. There is associated mild ex vacuo dilatation of the right lateral ventricle. A focal area of low attenuation in the left frontal lobe abutting the frontal horn of the left lateral ventricle most likely represents an area of subacute or old infarct. Clinical correlation is recommended. MRI may provide better characterization if clinically indicated. There is no acute intracranial hemorrhage. No mass effect or midline shift. No extra-axial fluid collection. Vascular: No hyperdense vessel or unexpected calcification. Skull: Normal. Negative for fracture or focal lesion. Sinuses/Orbits: No acute finding. Other: None IMPRESSION: 1. No acute intracranial hemorrhage. 2. Large right MCA territory old infarct and encephalomalacia. 3. Probable focal area of subacute or old infarct in the left  frontal lobe. Clinical correlation is recommended. Electronically Signed   By: Elgie Collard M.D.   On: 09/13/2019 14:48   Ct Angio Neck W Or Wo Contrast  Result Date: 09/13/2019 CLINICAL DATA:  Initial evaluation for acute altered mental status. Known acute to subacute left frontal stroke. EXAM: CT ANGIOGRAPHY HEAD AND NECK TECHNIQUE: Multidetector CT imaging of the head and neck was performed using the standard protocol during bolus administration of intravenous contrast. Multiplanar CT image reconstructions and MIPs were  obtained to evaluate the vascular anatomy. Carotid stenosis measurements (when applicable) are obtained utilizing NASCET criteria, using the distal internal carotid diameter as the denominator. CONTRAST:  75mL OMNIPAQUE IOHEXOL 350 MG/ML SOLN COMPARISON:  Prior head CT and MRI from earlier the same day. FINDINGS: CTA NECK FINDINGS Aortic arch: Visualized aortic arch normal in caliber without aneurysm or other acute finding. Bovine arch with common origin of the right brachiocephalic and left common carotid artery noted. No hemodynamically significant stenosis seen about the origin of the great vessels. Visualized subclavian arteries patent without stenosis. Right carotid system: Right CCA patent from its origin to the bifurcation without stenosis. Eccentric soft plaque at the origin of the right ICA with mild 35% stenosis by NASCET criteria. Right ICA diminutive but otherwise patent to the skull base without additional stenosis or occlusion. Left carotid system: Left CCA patent from its origin to the bifurcation without stenosis. No significant atheromatous narrowing about the left bifurcation. Left ICA also diffusely diminutive but patent to the skull base without additional stenosis or occlusion. Vertebral arteries: Both vertebral arteries arise from the subclavian arteries. Left vertebral artery dominant and widely patent within the neck without stenosis, dissection, or occlusion. Right vertebral artery diffusely hypoplastic and is occluded at its origin, and remains largely occluded within the neck. Minimal scant distal reconstitution within the right V2 segment, which subsequently re occludes at the V3 segment. Right vertebral is occluded as it courses into the cranial vault. Skeleton: No acute osseous abnormality. No discrete lytic or blastic osseous lesions. Moderate cervical spondylosis at C5-6. Other neck: No other acute soft tissue abnormality within the neck. Salivary glands within normal limits. Thyroid  normal. Enlarged left level IB node measures 12 mm in short axis (series 5, image 100). Additional mildly enlarged 11 mm right submental node (series 5, image 108). Findings are indeterminate, but could be reactive, although no acute inflammatory changes are seen within the neck. Upper chest: Visualized upper chest demonstrates no acute finding. Partially visualized lungs are grossly clear. Review of the MIP images confirms the above findings CTA HEAD FINDINGS Anterior circulation: ICAs markedly diminutive bilaterally, but patent as they course into the cranial vault. Petrous segments widely patent. Scattered atheromatous plaque within the cavernous/supraclinoid ICAs bilaterally. Associated moderate stenosis at the anterior genu of the cavernous left ICA. There is a severe near occlusive stenosis at the para clinoid right ICA (series 7, image 28). There is severe near occlusive stenosis at the right ICA terminus (series 7, image 22). Right A1 segment patent. Left ICA terminus is perfused. Occlusion of the left A1 segment at its origin, with some retrograde filling across the circle-of-Willis. Grossly normal anterior communicating artery complex. There is occlusion of the distal right A2 segment (series 7, image 98). Left ACA demonstrates multifocal severe stenoses and demonstrates markedly attenuated flow, although does remain patent to its distal aspect. Severe fairly long segment stenosis seen involving the left M1 segment (series 7, image 25). Left MCA remains severely stenotic nearly to the  level of the bifurcation, with only faintly visible flow. Markedly attenuated flow is seen distally within the left MCA branches, which also demonstrate small vessel atheromatous irregularity. On the right, the severe stenosis involving the right ICA terminus extends into the proximal-mid right M1 segment which demonstrates markedly diminutive flow. Distal right M1 segment patent. Negative right MCA bifurcation. Distal right  MCA branches are perfused, although demonstrates diffuse small vessel atheromatous irregularity. Posterior circulation: Dominant left vertebral artery demonstrates scattered atheromatous irregularity but is patent to the vertebrobasilar junction without stenosis. Right vertebral occluded as it courses into the cranial vault. Retrograde filling of the distal right V4 segment two perfuse the right PICA. Left PICA not seen. Basilar patent to its distal aspect without stenosis. Superior cerebral arteries patent bilaterally. Both of the posterior cerebral arteries primarily supplied via the basilar. Left PCA perfused to its distal aspect without stenosis. Short-segment severe distal right P2 stenosis noted (series 10, image 25). Venous sinuses: Grossly patent, although not well assessed due to timing of the contrast bolus. Anatomic variants: None significant. Review of the MIP images confirms the above findings IMPRESSION: 1. 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 Secondary severely attenuated flow throughout both MCA distributions, left worse than right. 2. Occluded right A2 segment. Left ACA severely diseased and attenuated but remains patent. 3. Occluded left A1 segment. 4. Hypoplastic and occluded right vertebral artery. Dominant left vertebral artery patent without significant stenosis. Retrograde filling of the distal right V4 segment with perfusion of the right PICA. 5. Severe distal right P2 stenosis. 6. Otherwise relative patency of the major arterial vasculature of the neck, although the ICAs are diffusely diminutive and hypoplastic bilaterally. 7. Mildly enlarged level I and left level II lymph nodes as above, indeterminate, but could be reactive. Electronically Signed   By: Rise Mu M.D.   On: 09/13/2019 23:24   Mr Brain Wo Contrast  Result Date: 09/13/2019 CLINICAL  DATA:  Initial evaluation for acute altered mental status, stroke suspected. EXAM: MRI HEAD WITHOUT CONTRAST TECHNIQUE: Multiplanar, multiecho pulse sequences of the brain and surrounding structures were obtained without intravenous contrast. COMPARISON:  Prior head CT from earlier the same day. FINDINGS: Brain: Examination moderately degraded by motion artifact. Generalized age-related cerebral atrophy. Large area of encephalomalacia and gliosis involving the right frontotemporal region and right basal ganglia compatible with chronic right MCA territory infarct. Superimposed areas of chronic hemosiderin staining. Associated wallerian degeneration at the right cerebral peduncle. Confluent area of restricted diffusion involving the left anterior genu of the corpus callosum with extension into the overlying subcortical left frontal lobe, compatible with acute to early subacute left ACA territory infarct. Finding corresponds with hypodensity on prior CT. No associated hemorrhage or significant mass effect. No other evidence for acute or subacute ischemia. No other areas of chronic cortical infarction. No acute intracranial hemorrhage. No mass lesion, midline shift or mass effect mild ex vacuo dilatation right lateral ventricle related to the chronic right MCA territory infarct. No hydrocephalus. No extra-axial fluid collection. Pituitary gland suprasellar region normal. Vascular: Major intracranial vascular flow voids are grossly maintained at the skull base. Skull and upper cervical spine: Craniocervical junction within normal limits. No focal marrow replacing lesion. No scalp soft tissue abnormality. Sinuses/Orbits: Globes and orbital soft tissues within normal limits. Paranasal sinuses are largely clear. No mastoid effusion. Other: None. IMPRESSION: 1. Moderate-sized acute to early subacute left ACA territory infarct. No associated hemorrhage  or mass effect. 2. Large chronic right MCA territory infarct. 3.  Underlying age-related cerebral atrophy. Electronically Signed   By: Rise Mu M.D.   On: 09/13/2019 19:39   Dg Foot 2 Views Left  Result Date: 09/13/2019 CLINICAL DATA:  Initial evaluation for acute left foot pain. EXAM: LEFT FOOT - 2 VIEW COMPARISON:  None. FINDINGS: There is an acute minimally displaced fracture through the base of the left fifth metatarsal. No other acute fracture dislocation. 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 Osteopenia. No soft tissue abnormality. IMPRESSION: 1. Acute minimally displaced fracture through the base of the left fifth metatarsal. 2. Osteopenia. Electronically Signed   By: Rise Mu M.D.   On: 09/13/2019 20:25   Vas [place] Lower Extremity Venous (dvt)  Result Date: 09/14/2019  Lower Venous Study Indications: Edema.  Performing Technologist: Levada Schilling RDMS, RVT  Examination Guidelines: A complete evaluation includes B-mode imaging, spectral Doppler, color Doppler, and power Doppler as needed of all accessible portions of each vessel. Bilateral testing is considered an integral part of a complete examination. Limited examinations for reoccurring indications may be performed as noted.  +---------+---------------+---------+-----------+----------+--------------+ RIGHT    CompressibilityPhasicitySpontaneityPropertiesThrombus Aging +---------+---------------+---------+-----------+----------+--------------+ CFV      Full           Yes      Yes                                 +---------+---------------+---------+-----------+----------+--------------+ SFJ      Full                                                        +---------+---------------+---------+-----------+----------+--------------+ FV Prox  Full                                                        +---------+---------------+---------+-----------+----------+--------------+ FV Mid   Full                                                         +---------+---------------+---------+-----------+----------+--------------+ FV DistalFull                                                        +---------+---------------+---------+-----------+----------+--------------+ PFV      Full                                                        +---------+---------------+---------+-----------+----------+--------------+ POP      Full           Yes      Yes                                 +---------+---------------+---------+-----------+----------+--------------+  PTV      Full                                                        +---------+---------------+---------+-----------+----------+--------------+ PERO     Full                                                        +---------+---------------+---------+-----------+----------+--------------+ GSV      Full                                                        +---------+---------------+---------+-----------+----------+--------------+   +---------+---------------+---------+-----------+----------+--------------+ LEFT     CompressibilityPhasicitySpontaneityPropertiesThrombus Aging +---------+---------------+---------+-----------+----------+--------------+ CFV      Full           Yes      Yes                                 +---------+---------------+---------+-----------+----------+--------------+ SFJ      Full                                                        +---------+---------------+---------+-----------+----------+--------------+ FV Prox  Full                                                        +---------+---------------+---------+-----------+----------+--------------+ FV Mid   Full                                                        +---------+---------------+---------+-----------+----------+--------------+ FV DistalFull                                                         +---------+---------------+---------+-----------+----------+--------------+ PFV      Full                                                        +---------+---------------+---------+-----------+----------+--------------+ POP      Full           Yes      Yes                                 +---------+---------------+---------+-----------+----------+--------------+  PTV      Full                                                        +---------+---------------+---------+-----------+----------+--------------+ PERO     Full                                                        +---------+---------------+---------+-----------+----------+--------------+ GSV      Full                                                        +---------+---------------+---------+-----------+----------+--------------+     Summary: Right: There is no evidence of deep vein thrombosis in the lower extremity. No cystic structure found in the popliteal fossa. Left: There is no evidence of deep vein thrombosis in the lower extremity. No cystic structure found in the popliteal fossa.  *See table(s) above for measurements and observations. Electronically signed by Lemar Livings MD on 09/14/2019 at 2:15:44 PM.    Final     Review of Systems  Constitutional: Negative for weight loss.  HENT: Negative for ear discharge, ear pain, hearing loss and tinnitus.   Eyes: Negative for blurred vision, double vision, photophobia and pain.  Respiratory: Negative for cough, sputum production and shortness of breath.   Cardiovascular: Negative for chest pain.  Gastrointestinal: Negative for abdominal pain, nausea and vomiting.  Genitourinary: Negative for dysuria, flank pain, frequency and urgency.  Musculoskeletal: Negative for back pain, falls, joint pain, myalgias and neck pain.  Neurological: Negative for dizziness, tingling, sensory change, focal weakness, loss of consciousness and headaches.  Endo/Heme/Allergies:  Does not bruise/bleed easily.  Psychiatric/Behavioral: Negative for depression, memory loss and substance abuse. The patient is not nervous/anxious.    Blood pressure 138/68, pulse 90, temperature 98.1 F (36.7 C), temperature source Oral, resp. rate 18, height 5' (1.524 m), weight 75.6 kg, SpO2 100 %. Physical Exam  Constitutional: She appears well-developed and well-nourished. No distress.  HENT:  Head: Normocephalic and atraumatic.  Eyes: Conjunctivae are normal. Right eye exhibits no discharge. Left eye exhibits no discharge. No scleral icterus.  Neck: Normal range of motion.  Cardiovascular: Normal rate and regular rhythm.  Respiratory: Effort normal. No respiratory distress.  Musculoskeletal:     Comments: LLE No traumatic wounds, ecchymosis, or rash  Mild TTP base of 5th MT  No knee or ankle effusion  Knee stable to varus/ valgus and anterior/posterior stress  Sens DPN, SPN, TN intact  Motor EHL, ext, flex, evers 5/5  DP 1+, PT 1+, No significant edema  Neurological: She is alert.  Skin: Skin is warm and dry. She is not diaphoretic.  Psychiatric: She has a normal mood and affect. Her behavior is normal.    Assessment/Plan: Left 5th MT fx -- This appears to be a zone 1 5th MT base fx. Normally we would advise a post-op shoe and 50% WB with a cane. Given her lack of pain and tenderness I wonder if this could be a  chronic finding. In any event, she should f/u with Dr. Aundria Rud as an OP in 2 weeks or so. Multiple medical problems including stroke, insulin-dependent diabetes, hyperlipidemia, and hypertension -- per primary service    Freeman Caldron, PA-C Orthopedic Surgery [phone] 09/14/2019, 2:35 PM

## 2019-09-14 NOTE — Evaluation (Signed)
Occupational Therapy Evaluation Patient Details Name: Christina Evans MRN: [id number] DOB: 22-Aug-1961 Today's Date: 09/14/2019    History of Present Illness Pt is a 58 y.o. F with significant PMH of stroke, insulin dependent diabetes, hyperlipidemia, hypertension. Presents with altered mental status. EEG 10/22 suggestive of mild diffuse encephalopathy and no seizures. MRI brain with moderate sized acute to early subacute left ACA territory infarct and large chronic right MCA territory infarct. Of note, foot x-ray showing acute minimally dispolaced fracture through base of left fifth metatarsal.     Clinical Impression   Pt PTA: living with a friend who is there 24/7. Pt reports to perform ADL with independence, but currently unable to donn socks ands said "I can't do that." Pt set-upA for most UB ADL and maxA for LB ADL. Pt requiring increased cues to follow commands, attend to task and sequence correctly. Pt minguardA for bed mobility and constant cueing for technique. Pt unable to stand today due to AMS. Multiple ways of trial to stand, but unable to trust PT/OT to assist stating "I don't know how." Pt would benefit from continued OT skilled services to increase activity tolerance, ADL tasks and increase independence with mobility. OT following acutely.     Follow Up Recommendations  SNF;Supervision/Assistance - 24 hour    Equipment Recommendations  None recommended by OT    Recommendations for Other Services       Precautions / Restrictions Precautions Precautions: Fall Restrictions Weight Bearing Restrictions: No      Mobility Bed Mobility Overal bed mobility: Needs Assistance Bed Mobility: Supine to Sit;Sit to Supine     Supine to sit: Min assist Sit to supine: Min guard   General bed mobility comments: Supine <> sit x 2 to both sides of bed, pt requiring min assist for sitting up, close min guard for lying back down.  Transfers                 General  transfer comment: unable, limited by decreased cognition    Balance Overall balance assessment: Needs assistance Sitting-balance support: Feet supported Sitting balance-Leahy Scale: Good                                     ADL either performed or assessed with clinical judgement   ADL Overall ADL's : Needs assistance/impaired Eating/Feeding: Set up;Sitting   Grooming: Supervision/safety;Set up;Sitting   Upper Body Bathing: Minimal assistance;Sitting Upper Body Bathing Details (indicate cue type and reason): due to LUE weakness/non-use Lower Body Bathing: Maximal assistance;Sitting/lateral leans;Bed level;Cueing for safety   Upper Body Dressing : Set up;Sitting   Lower Body Dressing: Maximal assistance;Cueing for safety;Sitting/lateral leans;Bed level     Toilet Transfer Details (indicate cue type and reason): pt unable to stand or attempt Toileting- Clothing Manipulation and Hygiene: Maximal assistance;Sitting/lateral lean;Bed level         General ADL Comments: set-upA for most UB ADL and maxA for LB ADL. Pt requiring increased cues to follow commands, attend to task and sequence correctly.     Vision   Additional Comments: Pt unable to attend enough to scan for visual testing     Perception     Praxis      Pertinent Vitals/Pain Pain Assessment: Faces Faces Pain Scale: Hurts a little bit Pain Location: left dorsal foot tender to palpation Pain Descriptors / Indicators: Tender;Grimacing Pain Intervention(s): Monitored during session     Hand  Dominance Right   Extremity/Trunk Assessment Upper Extremity Assessment Upper Extremity Assessment: Generalized weakness;LUE deficits/detail LUE Deficits / Details: nearly contracted at elbow and 1/5 MM grade for hand squeeze LUE Coordination: decreased fine motor;decreased gross motor   Lower Extremity Assessment Lower Extremity Assessment: Generalized weakness   Cervical / Trunk Assessment Cervical /  Trunk Assessment: Normal   Communication Communication Communication: Prefers language other than English   Cognition Arousal/Alertness: Awake/alert Behavior During Therapy: WFL for tasks assessed/performed;Flat affect Overall Cognitive Status: Impaired/Different from baseline Area of Impairment: Attention;Orientation;Following commands;Safety/judgement;Awareness;Problem solving                 Orientation Level: Disoriented to;Place;Situation Current Attention Level: Focused;Sustained   Following Commands: Follows one step commands inconsistently Safety/Judgement: Decreased awareness of deficits Awareness: Intellectual Problem Solving: Slow processing;Decreased initiation;Difficulty sequencing;Requires verbal cues;Requires tactile cues General Comments: Pt highly distractable, constantly fidgeting with IV lines, gown, bed sheets. Often says things out of context to task, like "look at the smoke outside," or "you have the same shoes on." Is able to verbalize task that needs to be completed i.e. standing, but unable to initiate or execute. With attempts to help, pt stating "wait, wait, hold on;" and then just stares into space with no attempts to replicate task at hand. Pt stating, "she doesn't know how to stand," despite multimodal cueing including demonstrating.    General Comments       Exercises     Shoulder Instructions      Home Living Family/patient expects to be discharged to:: Private residence Living Arrangements: Other relatives Available Help at Discharge: Friend(s);Available PRN/intermittently Type of Home: House Home Access: Level entry     Home Layout: One level     Bathroom Shower/Tub: Tub/shower unit;Walk-in shower   Bathroom Toilet: Standard     Home Equipment: None          Prior Functioning/Environment Level of Independence: Independent        Comments: Pt reports that she manages own medications. No falls recently. L sided weakness from  previous CVA        OT Problem List: Decreased strength;Decreased activity tolerance;Impaired balance (sitting and/or standing);Decreased cognition;Impaired UE functional use      OT Treatment/Interventions: Self-care/ADL training;Therapeutic exercise;Neuromuscular education;Energy conservation;DME and/or AE instruction;Therapeutic activities;Patient/family education;Balance training;Visual/perceptual remediation/compensation    OT Goals(Current goals can be found in the care plan section) Acute Rehab OT Goals Patient Stated Goal: none stated OT Goal Formulation: With patient Time For Goal Achievement: 09/28/19 Potential to Achieve Goals: Good ADL Goals Pt Will Perform Grooming: with supervision;standing Pt Will Transfer to Toilet: with set-up;ambulating Pt Will Perform Toileting - Clothing Manipulation and hygiene: with set-up;sit to/from stand Pt/caregiver will Perform Home Exercise Program: Both right and left upper extremity;Increased strength;With written HEP provided Additional ADL Goal #1: Pt will attend to task x5 mins with 1-2 verbal cues as precursor to performing ADL safely.  OT Frequency: Min 2X/week   Barriers to D/C:            Co-evaluation PT/OT/SLP Co-Evaluation/Treatment: Yes Reason for Co-Treatment: Necessary to address cognition/behavior during functional activity   OT goals addressed during session: ADL's and self-care      AM-PAC OT "6 Clicks" Daily Activity     Outcome Measure Help from another person eating meals?: A Little Help from another person taking care of personal grooming?: A Little Help from another person toileting, which includes using toliet, bedpan, or urinal?: A Lot Help from another person bathing (including washing, rinsing,  drying)?: A Lot Help from another person to put on and taking off regular upper body clothing?: A Little Help from another person to put on and taking off regular lower body clothing?: A Lot 6 Click Score: 15    End of Session Equipment Utilized During Treatment: Gait belt Nurse Communication: Mobility status  Activity Tolerance: Treatment limited secondary to medical complications (Comment);Other (comment)(limited due to poor attention span.) Patient left: in bed;with call bell/phone within reach;with bed alarm set  OT Visit Diagnosis: Unsteadiness on feet (R26.81);Muscle weakness (generalized) (M62.81)                Time: 0623-7628 OT Time Calculation (min): 32 min Charges:  OT General Charges $OT Visit: 1 Visit OT Evaluation $OT Eval Moderate Complexity: 1 Mod  Cristi Loron) Glendell Docker OTR/L Acute Rehabilitation Services Pager: [phone] Office: [phone]   Christina Evans 09/14/2019, 2:00 PM

## 2019-09-14 NOTE — Progress Notes (Signed)
STROKE TEAM PROGRESS NOTE   INTERVAL HISTORY Pt sitting in bed, awake alert knows in hospital but does not know why. Orientated to place self age and people but not time. Still has left residue deficit but left arm and leg no deficit. Discussed with her about intracranial vascular stenosis, she is in agreement for cerebral angio tomorrow. I called her daughter Christina Evans and she is also in agreement. I made Dr. Corliss Skains aware.   Vitals:   09/13/19 2300 09/14/19 0110 09/14/19 0536 09/14/19 0803  BP: 119/72 132/72 (!) 145/72 124/75  Pulse:  100 91 97  Resp: 18   17  Temp:  98.7 F (37.1 C) 97.6 F (36.4 C) 97.8 F (36.6 C)  TempSrc:  Oral Oral Oral  SpO2:  96% 98% 95%    CBC:  Recent Labs  Lab 09/13/19 1257 09/13/19 1339 09/14/19 0227  WBC 9.5  --  10.3  HGB 14.5 15.0 13.1  HCT 44.8 44.0 39.7  MCV 93.1  --  91.9  PLT 370  --  331    Basic Metabolic Panel:  Recent Labs  Lab 09/13/19 1257 09/13/19 1317 09/13/19 1339 09/14/19 0227  NA 142  --  141 138  K 3.8  --  3.7 3.6  CL 104  --   --  104  CO2 26  --   --  24  GLUCOSE 255*  --   --  296*  BUN 31*  --   --  29*  CREATININE 0.55  --   --  0.60  CALCIUM 9.5  --   --  8.7*  MG  --  2.2  --   --   PHOS  --  4.0  --   --    Lipid Panel:     Component Value Date/Time   CHOL 210 (H) 09/14/2019 0227   TRIG 152 (H) 09/14/2019 0227   HDL 35 (L) 09/14/2019 0227   CHOLHDL 6.0 09/14/2019 0227   VLDL 30 09/14/2019 0227   LDLCALC 145 (H) 09/14/2019 0227   HgbA1c:  Lab Results  Component Value Date   HGBA1C 9.8 (H) 09/13/2019   Urine Drug Screen:     Component Value Date/Time   LABOPIA NONE DETECTED 09/13/2019 1845   COCAINSCRNUR NONE DETECTED 09/13/2019 1845   LABBENZ NONE DETECTED 09/13/2019 1845   AMPHETMU NONE DETECTED 09/13/2019 1845   THCU NONE DETECTED 09/13/2019 1845   LABBARB NONE DETECTED 09/13/2019 1845    Alcohol Level     Component Value Date/Time   ETH <10 09/13/2019 1317    IMAGING Ct Angio  Head W Or Wo Contrast  Result Date: 09/13/2019 CLINICAL DATA:  Initial evaluation for acute altered mental status. Known acute to subacute left frontal stroke. EXAM: CT ANGIOGRAPHY HEAD AND NECK TECHNIQUE: Multidetector CT imaging of the head and neck was performed using the standard protocol during bolus administration of intravenous contrast. Multiplanar CT image reconstructions and MIPs were obtained to evaluate the vascular anatomy. Carotid stenosis measurements (when applicable) are obtained utilizing NASCET criteria, using the distal internal carotid diameter as the denominator. CONTRAST:  30mL OMNIPAQUE IOHEXOL 350 MG/ML SOLN COMPARISON:  Prior head CT and MRI from earlier the same day. FINDINGS: CTA NECK FINDINGS Aortic arch: Visualized aortic arch normal in caliber without aneurysm or other acute finding. Bovine arch with common origin of the right brachiocephalic and left common carotid artery noted. No hemodynamically significant stenosis seen about the origin of the great vessels. Visualized subclavian arteries patent without  stenosis. Right carotid system: Right CCA patent from its origin to the bifurcation without stenosis. Eccentric soft plaque at the origin of the right ICA with mild 35% stenosis by NASCET criteria. Right ICA diminutive but otherwise patent to the skull base without additional stenosis or occlusion. Left carotid system: Left CCA patent from its origin to the bifurcation without stenosis. No significant atheromatous narrowing about the left bifurcation. Left ICA also diffusely diminutive but patent to the skull base without additional stenosis or occlusion. Vertebral arteries: Both vertebral arteries arise from the subclavian arteries. Left vertebral artery dominant and widely patent within the neck without stenosis, dissection, or occlusion. Right vertebral artery diffusely hypoplastic and is occluded at its origin, and remains largely occluded within the neck. Minimal scant  distal reconstitution within the right V2 segment, which subsequently re occludes at the V3 segment. Right vertebral is occluded as it courses into the cranial vault. Skeleton: No acute osseous abnormality. No discrete lytic or blastic osseous lesions. Moderate cervical spondylosis at C5-6. Other neck: No other acute soft tissue abnormality within the neck. Salivary glands within normal limits. Thyroid normal. Enlarged left level IB node measures 12 mm in short axis (series 5, image 100). Additional mildly enlarged 11 mm right submental node (series 5, image 108). Findings are indeterminate, but could be reactive, although no acute inflammatory changes are seen within the neck. Upper chest: Visualized upper chest demonstrates no acute finding. Partially visualized lungs are grossly clear. Review of the MIP images confirms the above findings CTA HEAD FINDINGS Anterior circulation: ICAs markedly diminutive bilaterally, but patent as they course into the cranial vault. Petrous segments widely patent. Scattered atheromatous plaque within the cavernous/supraclinoid ICAs bilaterally. Associated moderate stenosis at the anterior genu of the cavernous left ICA. There is a severe near occlusive stenosis at the para clinoid right ICA (series 7, image 28). There is severe near occlusive stenosis at the right ICA terminus (series 7, image 22). Right A1 segment patent. Left ICA terminus is perfused. Occlusion of the left A1 segment at its origin, with some retrograde filling across the circle-of-Willis. Grossly normal anterior communicating artery complex. There is occlusion of the distal right A2 segment (series 7, image 98). Left ACA demonstrates multifocal severe stenoses and demonstrates markedly attenuated flow, although does remain patent to its distal aspect. Severe fairly long segment stenosis seen involving the left M1 segment (series 7, image 25). Left MCA remains severely stenotic nearly to the level of the  bifurcation, with only faintly visible flow. Markedly attenuated flow is seen distally within the left MCA branches, which also demonstrate small vessel atheromatous irregularity. On the right, the severe stenosis involving the right ICA terminus extends into the proximal-mid right M1 segment which demonstrates markedly diminutive flow. Distal right M1 segment patent. Negative right MCA bifurcation. Distal right MCA branches are perfused, although demonstrates diffuse small vessel atheromatous irregularity. Posterior circulation: Dominant left vertebral artery demonstrates scattered atheromatous irregularity but is patent to the vertebrobasilar junction without stenosis. Right vertebral occluded as it courses into the cranial vault. Retrograde filling of the distal right V4 segment two perfuse the right PICA. Left PICA not seen. Basilar patent to its distal aspect without stenosis. Superior cerebral arteries patent bilaterally. Both of the posterior cerebral arteries primarily supplied via the basilar. Left PCA perfused to its distal aspect without stenosis. Short-segment severe distal right P2 stenosis noted (series 10, image 25). Venous sinuses: Grossly patent, although not well assessed due to timing of the contrast bolus. Anatomic variants:  None significant. Review of the MIP images confirms the above findings IMPRESSION: 1. 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 Secondary severely attenuated flow throughout both MCA distributions, left worse than right. 2. Occluded right A2 segment. Left ACA severely diseased and attenuated but remains patent. 3. Occluded left A1 segment. 4. Hypoplastic and occluded right vertebral artery. Dominant left vertebral artery patent without significant stenosis. Retrograde filling of the distal right V4 segment with perfusion of the right PICA. 5. Severe  distal right P2 stenosis. 6. Otherwise relative patency of the major arterial vasculature of the neck, although the ICAs are diffusely diminutive and hypoplastic bilaterally. 7. Mildly enlarged level I and left level II lymph nodes as above, indeterminate, but could be reactive. Electronically Signed   By: Rise Mu M.D.   On: 09/13/2019 23:24   Dg Chest 2 View  Result Date: 09/13/2019 CLINICAL DATA:  Initial evaluation for acute altered mental status, history of asthma, former smoker. EXAM: CHEST - 2 VIEW COMPARISON:  Prior radiograph from 09/01/2017. FINDINGS: Mild cardiomegaly, stable. Mediastinal silhouette within normal limits. Lungs hypoinflated. Mild diffuse pulmonary vascular congestion with interstitial prominence, suggesting pulmonary interstitial congestion. No frank pulmonary edema. No pleural effusion. No focal infiltrates. No pneumothorax. No acute osseous finding. IMPRESSION: 1. Cardiomegaly with mild diffuse pulmonary interstitial congestion without frank pulmonary edema. 2. No other active cardiopulmonary disease. Electronically Signed   By: Rise Mu M.D.   On: 09/13/2019 20:23   Ct Head Wo Contrast  Result Date: 09/13/2019 CLINICAL DATA:  58 year old female with altered mental status. EXAM: CT HEAD WITHOUT CONTRAST TECHNIQUE: Contiguous axial images were obtained from the base of the skull through the vertex without intravenous contrast. COMPARISON:  None. FINDINGS: Brain: There is mild age-related atrophy. Large area of old infarct and encephalomalacia noted in the right MCA territory. There is associated mild ex vacuo dilatation of the right lateral ventricle. A focal area of low attenuation in the left frontal lobe abutting the frontal horn of the left lateral ventricle most likely represents an area of subacute or old infarct. Clinical correlation is recommended. MRI may provide better characterization if clinically indicated. There is no acute intracranial  hemorrhage. No mass effect or midline shift. No extra-axial fluid collection. Vascular: No hyperdense vessel or unexpected calcification. Skull: Normal. Negative for fracture or focal lesion. Sinuses/Orbits: No acute finding. Other: None IMPRESSION: 1. No acute intracranial hemorrhage. 2. Large right MCA territory old infarct and encephalomalacia. 3. Probable focal area of subacute or old infarct in the left frontal lobe. Clinical correlation is recommended. Electronically Signed   By: Elgie Collard M.D.   On: 09/13/2019 14:48   Ct Angio Neck W Or Wo Contrast  Result Date: 09/13/2019 CLINICAL DATA:  Initial evaluation for acute altered mental status. Known acute to subacute left frontal stroke. EXAM: CT ANGIOGRAPHY HEAD AND NECK TECHNIQUE: Multidetector CT imaging of the head and neck was performed using the standard protocol during bolus administration of intravenous contrast. Multiplanar CT image reconstructions and MIPs were obtained to evaluate the vascular anatomy. Carotid stenosis measurements (when applicable) are obtained utilizing NASCET criteria, using the distal internal carotid diameter as the denominator. CONTRAST:  75mL OMNIPAQUE IOHEXOL 350 MG/ML SOLN COMPARISON:  Prior head CT and MRI from earlier the same day. FINDINGS: CTA NECK FINDINGS Aortic arch: Visualized aortic arch normal in caliber without aneurysm or other acute finding. Bovine arch with common origin of the right  brachiocephalic and left common carotid artery noted. No hemodynamically significant stenosis seen about the origin of the great vessels. Visualized subclavian arteries patent without stenosis. Right carotid system: Right CCA patent from its origin to the bifurcation without stenosis. Eccentric soft plaque at the origin of the right ICA with mild 35% stenosis by NASCET criteria. Right ICA diminutive but otherwise patent to the skull base without additional stenosis or occlusion. Left carotid system: Left CCA patent from  its origin to the bifurcation without stenosis. No significant atheromatous narrowing about the left bifurcation. Left ICA also diffusely diminutive but patent to the skull base without additional stenosis or occlusion. Vertebral arteries: Both vertebral arteries arise from the subclavian arteries. Left vertebral artery dominant and widely patent within the neck without stenosis, dissection, or occlusion. Right vertebral artery diffusely hypoplastic and is occluded at its origin, and remains largely occluded within the neck. Minimal scant distal reconstitution within the right V2 segment, which subsequently re occludes at the V3 segment. Right vertebral is occluded as it courses into the cranial vault. Skeleton: No acute osseous abnormality. No discrete lytic or blastic osseous lesions. Moderate cervical spondylosis at C5-6. Other neck: No other acute soft tissue abnormality within the neck. Salivary glands within normal limits. Thyroid normal. Enlarged left level IB node measures 12 mm in short axis (series 5, image 100). Additional mildly enlarged 11 mm right submental node (series 5, image 108). Findings are indeterminate, but could be reactive, although no acute inflammatory changes are seen within the neck. Upper chest: Visualized upper chest demonstrates no acute finding. Partially visualized lungs are grossly clear. Review of the MIP images confirms the above findings CTA HEAD FINDINGS Anterior circulation: ICAs markedly diminutive bilaterally, but patent as they course into the cranial vault. Petrous segments widely patent. Scattered atheromatous plaque within the cavernous/supraclinoid ICAs bilaterally. Associated moderate stenosis at the anterior genu of the cavernous left ICA. There is a severe near occlusive stenosis at the para clinoid right ICA (series 7, image 28). There is severe near occlusive stenosis at the right ICA terminus (series 7, image 22). Right A1 segment patent. Left ICA terminus is  perfused. Occlusion of the left A1 segment at its origin, with some retrograde filling across the circle-of-Willis. Grossly normal anterior communicating artery complex. There is occlusion of the distal right A2 segment (series 7, image 98). Left ACA demonstrates multifocal severe stenoses and demonstrates markedly attenuated flow, although does remain patent to its distal aspect. Severe fairly long segment stenosis seen involving the left M1 segment (series 7, image 25). Left MCA remains severely stenotic nearly to the level of the bifurcation, with only faintly visible flow. Markedly attenuated flow is seen distally within the left MCA branches, which also demonstrate small vessel atheromatous irregularity. On the right, the severe stenosis involving the right ICA terminus extends into the proximal-mid right M1 segment which demonstrates markedly diminutive flow. Distal right M1 segment patent. Negative right MCA bifurcation. Distal right MCA branches are perfused, although demonstrates diffuse small vessel atheromatous irregularity. Posterior circulation: Dominant left vertebral artery demonstrates scattered atheromatous irregularity but is patent to the vertebrobasilar junction without stenosis. Right vertebral occluded as it courses into the cranial vault. Retrograde filling of the distal right V4 segment two perfuse the right PICA. Left PICA not seen. Basilar patent to its distal aspect without stenosis. Superior cerebral arteries patent bilaterally. Both of the posterior cerebral arteries primarily supplied via the basilar. Left PCA perfused to its distal aspect without stenosis. Short-segment severe distal right  P2 stenosis noted (series 10, image 25). Venous sinuses: Grossly patent, although not well assessed due to timing of the contrast bolus. Anatomic variants: None significant. Review of the MIP images confirms the above findings IMPRESSION: 1. Severe atherosclerotic change throughout the intracranial  circulation with multifocal severe stenoses involving the cavernous right ICA and M1 segments bilaterally. Secondary severely attenuated flow throughout both MCA distributions, left worse than right. 2. Occluded right A2 segment. Left ACA severely diseased and attenuated but remains patent. 3. Occluded left A1 segment. 4. Hypoplastic and occluded right vertebral artery. Dominant left vertebral artery patent without significant stenosis. Retrograde filling of the distal right V4 segment with perfusion of the right PICA. 5. Severe distal right P2 stenosis. 6. Otherwise relative patency of the major arterial vasculature of the neck, although the ICAs are diffusely diminutive and hypoplastic bilaterally. 7. Mildly enlarged level I and left level II lymph nodes as above, indeterminate, but could be reactive. Electronically Signed   By: Rise MuBenjamin  McClintock M.D.   On: 09/13/2019 23:24   Mr Brain Wo Contrast  Result Date: 09/13/2019 CLINICAL DATA:  Initial evaluation for acute altered mental status, stroke suspected. EXAM: MRI HEAD WITHOUT CONTRAST TECHNIQUE: Multiplanar, multiecho pulse sequences of the brain and surrounding structures were obtained without intravenous contrast. COMPARISON:  Prior head CT from earlier the same day. FINDINGS: Brain: Examination moderately degraded by motion artifact. Generalized age-related cerebral atrophy. Large area of encephalomalacia and gliosis involving the right frontotemporal region and right basal ganglia compatible with chronic right MCA territory infarct. Superimposed areas of chronic hemosiderin staining. Associated wallerian degeneration at the right cerebral peduncle. Confluent area of restricted diffusion involving the left anterior genu of the corpus callosum with extension into the overlying subcortical left frontal lobe, compatible with acute to early subacute left ACA territory infarct. Finding corresponds with hypodensity on prior CT. No associated hemorrhage or  significant mass effect. No other evidence for acute or subacute ischemia. No other areas of chronic cortical infarction. No acute intracranial hemorrhage. No mass lesion, midline shift or mass effect mild ex vacuo dilatation right lateral ventricle related to the chronic right MCA territory infarct. No hydrocephalus. No extra-axial fluid collection. Pituitary gland suprasellar region normal. Vascular: Major intracranial vascular flow voids are grossly maintained at the skull base. Skull and upper cervical spine: Craniocervical junction within normal limits. No focal marrow replacing lesion. No scalp soft tissue abnormality. Sinuses/Orbits: Globes and orbital soft tissues within normal limits. Paranasal sinuses are largely clear. No mastoid effusion. Other: None. IMPRESSION: 1. Moderate-sized acute to early subacute left ACA territory infarct. No associated hemorrhage or mass effect. 2. Large chronic right MCA territory infarct. 3. Underlying age-related cerebral atrophy. Electronically Signed   By: Rise MuBenjamin  McClintock M.D.   On: 09/13/2019 19:39   Dg Foot 2 Views Left  Result Date: 09/13/2019 CLINICAL DATA:  Initial evaluation for acute left foot pain. EXAM: LEFT FOOT - 2 VIEW COMPARISON:  None. FINDINGS: There is an acute minimally displaced fracture through the base of the left fifth metatarsal. No other acute fracture dislocation. 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 Osteopenia. No soft tissue abnormality. IMPRESSION: 1. Acute minimally displaced fracture through the base of the left fifth metatarsal. 2. Osteopenia. Electronically Signed   By: Rise MuBenjamin  McClintock M.D.   On: 09/13/2019 20:25   EEG -Continuous slow, generalized. IMPRESSION: This study is suggestive of mild diffuse encephalopathy, nonspecific to etiology. No seizures or epileptiform discharges were seen throughout the recording.  PHYSICAL EXAM  Temp:  [97.6 F (36.4 C)-98.7  F (37.1 C)] 98 F (36.7 C) (10/22 1559) Pulse Rate:  [89-106] 94 (10/22 1559) Resp:  [14-18] 16 (10/22 1559) BP: (105-147)/(64-91) 105/91 (10/22 1559) SpO2:  [94 %-100 %] 100 % (10/22 1559) Weight:  [75.6 kg] 75.6 kg (10/22 1408)  General - Well nourished, well developed, in no apparent distress.  Ophthalmologic - fundi not visualized due to noncooperation.  Cardiovascular - Regular rhythm and rate.  Mental Status -  Level of arousal and orientation to self, age, place, and person were intact, however, not orientated to time Language including expression, naming, repetition, comprehension was assessed and found intact.  Cranial Nerves II - XII - II - Visual field intact OU III, IV, VI - Extraocular movements intact. V - Facial sensation intact bilaterally. VII - left mild facial droop. VIII - Hearing & vestibular intact bilaterally. X - Palate elevates symmetrically. XI - Chin turning & shoulder shrug intact bilaterally. XII - Tongue protrusion intact.  Motor Strength - The patient's strength was normal in RUE and RLE, however, LUE spastic with contracture at hand and elbow, 0/5 proximal and distal. LLE proximal 3/5, knee extension 3+/5 and distal DF/PF 0/5.  Bulk was normal and fasciculations were absent.   Motor Tone - Muscle tone was assessed at the neck and appendages and was increased on the LUE and LLE.  Reflexes - The patient's reflexes were 3+ LUE and LLE and she had left babinski.  Sensory - Light touch, temperature/pinprick were assessed and were symmetrical.    Coordination - The patient had normal movements in the right hand with no ataxia or dysmetria.  Tremor was absent.  Gait and Station - deferred.   ASSESSMENT/PLAN Ms. Christina Evans is a 58 y.o. 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 In ED found to be confused.   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 Old large R MCA infarct. Subacute or old L frontal lobe infarct   MRI  Moderate early subacute L ACA infarct. Large old R MCA infarct. Atrophy.   CTA head & neck severe intracranial atherosclerosis (cavernous R ICA, B M1 L<R, L ACA, R P2 stenosis; occluded R A2, L A1, R VA). B ICAs small and hypoplastic.   LE Doppler no DVT  2D Echo EF 60-65%  EEG mild diffuse encephalopathy, no sz   Will do diagnostic angio in am  LDL 145  HgbA1c 9.8  UDS neg  Hypercoagulable labs pending  Lovenox 40 mg sq daily for VTE prophylaxis  No antithrombotic prior to admission, now on aspirin 325 mg daily and plavix  DAPT. Continue on discharge.   Therapy recommendations:  pending   Disposition:  pending   Secondary moyamoya  CTA head & neck severe intracranial atherosclerosis (cavernous R ICA, B M1 L<R, L ACA).  Will do diagnostic angio in am  Likely due to uncontrolled risk factors and medication non compliance  Continue DAPT long term  Continue high dose statin  Daughter is willing to supervise pt medication and medical appointment  Hypertension  Stable on the low side  Home meds including HCTZ and valsartan  No BP at this time  Avoid low  BP . Permissive hypertension (OK if < 220/120) but gradually towards goal in 5-7 days . Long-term BP goal 130-150 given moyamoya picture  Hyperlipidemia  Home meds:  lipitor 80 and fish oil, statin resumed in hospital  LDL 145, goal < 70  Continue high dose statin and fish oil at discharge  Diabetes type II Uncontrolled  HgbA1c 9.8, goal < 7.0  CBGs  SSI  Hyperglycemia  On lantus  Not compliant at home  Daughter Christina Evans will supervise after discharge  Other Stroke Risk Factors  Former Cigarette  smoker  ETOH use, advised to drink no more than 1 drink(s) a day  Hx stroke/TIA  Years ago with old large R MCA infarct and left spastic hemiparesis, details not available   Other Active Problems  L 5th metatarsal fx - ortho on board  Depression on celexa  Asthma on albuterol prn  Hospital day # 0  I spent  35 minutes in total face-to-face time with the patient, more than 50% of which was spent in counseling and coordination of care, reviewing test results, images and medication, and discussing the diagnosis of recurrent stroke, secondary moyamoya, treatment plan and potential prognosis. This patient's care requiresreview of multiple databases, neurological assessment, discussion with family, other specialists and medical decision making of high complexity. I had long discussion with pt at bedside and daughter [name] over the phone, updated pt current condition, treatment plan and potential prognosis, and answered all the questions. They expressed understanding and appreciation. I also discussed with Dr. Corliss Skains.  Marvel Plan, MD PhD Stroke Neurology 09/14/2019 6:04 PM   To contact Stroke Continuity provider, please refer to [email or web address]. After hours, contact General Neurology

## 2019-09-14 NOTE — Progress Notes (Signed)
  Echocardiogram 2D Echocardiogram has been performed.  Christina Evans 09/14/2019, 8:50 AM

## 2019-09-14 NOTE — TOC Initial Note (Signed)
Transition of Care [facility] - Initial/Assessment Note    Patient Details  Name: Christina Evans MRN: [id number] Date of Birth: 02-13-1961  [facility] CM/SW Contact:    Mearl Latin, LCSW Phone Number: 09/14/2019, 5:50 PM  Clinical Narrative:                 CSW received consult for possible SNF placement at time of discharge. CSW spoke with patient's daughter regarding PT recommendation of SNF placement at time of discharge. Patient's daughter reported that patient has been in SNF before and she prefers for patient to do outpatient PT instead since she has done that before and it helped. Patient lives with roommates normally in a different city, but daughter reports that patient will be moving in with her at: [address] [place]. She is requesting patient be set up at [facility] and [facility] for follow up. She is also requesting DME 3 in 1 BSC, Wheelchair, and shower stool. CSW will reach out to [employer] for delivery to the room prior to discharge. No further questions reported at this time. CSW to continue to follow and assist with discharge planning needs.    Expected Discharge Plan: OP Rehab Barriers to Discharge: Continued Medical Work up   Patient Goals and CMS Choice Patient states their goals for this hospitalization and ongoing recovery are:: Get stronger CMS Medicare.gov Compare Post Acute Care list provided to:: Patient Represenative (must comment)(Daughter) Choice offered to / list presented to : Adult Children  Expected Discharge Plan and Services Expected Discharge Plan: OP Rehab In-house Referral: Clinical Social Work Discharge Planning Services: CM Consult, Follow-up appt scheduled Post Acute Care Choice: (OP Rehab) Living arrangements for the past 2 months: Single Family Home                 DME Arranged: 3-N-1, Shower stool, Lightweight manual wheelchair with seat cushion DME Agency: [employer]       HH Arranged: NA           Prior Living Arrangements/Services Living arrangements for the past 2 months: Single Family Home Lives with:: Roommate Patient language and need for interpreter reviewed:: Yes Do you feel safe going back to the place where you live?: Yes      Need for Family Participation in Patient Care: Yes (Comment) Care giver support system in place?: Yes (comment)   Criminal Activity/Legal Involvement Pertinent to Current Situation/Hospitalization: No - Comment as needed  Activities of Daily Living      Permission Sought/Granted Permission sought to share information with : Facility Medical sales representative, Family Supports Permission granted to share information with : Yes, Verbal Permission Granted  Share Information with NAME: [name]  Permission granted to share info w AGENCY: outpatient PT  Permission granted to share info w Relationship: Daughter  Permission granted to share info w Contact Information: [phone]  Emotional Assessment Appearance:: Appears stated age Attitude/Demeanor/Rapport: Unable to Assess Affect (typically observed): Unable to Assess Orientation: : Oriented to Self, Oriented to Place Alcohol / Substance Use: Not Applicable Psych Involvement: No (comment)  Admission diagnosis:  Encephalopathy [G93.40] Severe comorbid illness [R69] Pain after cerebrovascular accident (CVA) [J24.268, R52] AMS (altered mental status) [R41.82] Patient Active Problem List   Diagnosis Date Noted  . Acute CVA (cerebrovascular accident) (HCC) 09/14/2019  . CVA (cerebral vascular accident) (HCC) 09/14/2019  . Severe comorbid illness   . AMS (altered mental status) 09/13/2019  . Insulin dependent diabetes mellitus 09/02/2017  . Hyperlipidemia 09/02/2017  .  Hypertension 09/02/2017  . Chest pain 09/02/2017   PCP:  Patient, No Pcp Per Pharmacy:   [facility] [address], [place] [address] Phone: [phone] Fax:  [phone]     Social Determinants of Health (SDOH) Interventions    Readmission Risk Interventions No flowsheet data found.

## 2019-09-14 NOTE — Procedures (Signed)
Patient Name: Arleth Mccullar  MRN: [id number]  Epilepsy Attending: Lora Havens  Referring Physician/Provider: Dr. Charlesetta Shanks Date: 09/13/2019 Duration: 25.30 minutes  Patient history: 58 year old female with altered mental status.  EEG to evaluate for seizures.  Level of alertness: Awake, asleep  AEDs during EEG study: None  Technical aspects: This EEG study was done with scalp electrodes positioned according to the 10-20 International system of electrode placement. Electrical activity was acquired at a sampling rate of 500Hz  and reviewed with a high frequency filter of 70Hz  and a low frequency filter of 1Hz . EEG data were recorded continuously and digitally stored.   Description: During awake state, the posterior dominant rhythm consists of 8-9 Hz activity of moderate voltage (25-35 uV) seen predominantly in posterior head regions, symmetric and reactive to eye opening and eye closing.  Sleep was characterized by generalized theta-delta slowing.  EEG also showed continuous generalized 2 to 5 Hz theta-delta slowing. Hyperventilation and photic stimulation were not performed.  Abnormality -Continuous slow, generalized  IMPRESSION: This study is suggestive of mild diffuse encephalopathy, nonspecific to etiology. No seizures or epileptiform discharges were seen throughout the recording.  Alastair Hennes Barbra Sarks

## 2019-09-14 NOTE — Progress Notes (Signed)
Family Medicine Teaching Service Daily Progress Note Intern Pager: [phone]  Patient name: Christina Evans Medical record number: [id number] Date of birth: 02-17-1961 Age: 58 y.o. Gender: female  Primary Care Provider: Patient, No Pcp Per Consultants: Neurology Code Status: Full Code   Pt Overview and Major Events to Date:  10/21- admitted with actue stroke in left ACA, started on ASA 325, no tPa, neuro consulted    Assessment and Plan: Christina Evans is a 58 y.o. female presenting with altered mental status. PMH is significant for history of stroke, insulin-dependent diabetes, hyperlipidemia, hypertension.  Acute left ACA CVA, stable Patient admitted with altered mental status and found to have left acute-subacute infarct in left ACA without hemorrhage.  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  Patient so far has been able to rule out pulmonary infection and of TIAs patient is asymptomatic and UA not convincing.  Patient also had no electrolyte abnormalities on admission and EKG has been normal sinus rhythm.  Repeat electrolytes within normal limits this morning and no leukocytosis, anemia or thrombocytopenia cholesterol panel with elevated cholesterol 219, elevated triglycerides of 152, low HDL 35 and elevated LDL of 145.  Patient has elevated sed rate of 41, normal CRP and vitamin B12 levels.  RPR pending.  Patient is also being worked up for hypercoagulability labs and homocystine.  Urine culture pending.  Blood cultures negative less than 24 hours. Overnight, patient with stable mental status.  Patient is alert oriented to self, disoriented test to rule out she has been lost to location and city.  Patient exhibits weakness on left upper and lower extremity, left foot pain with hyperpigmented and overlying digits.  Strength 5 out of 5 upper and lower extremities, 3 out of 5 left upper and lower extremities.  Patient withdraws left foot to  palpation dorsal. -Continue aspirin 325 -Continue atorvastatin 80 mg -Neurology following, will appreciate further recommendations -Follow-up urine cultures -Blood cultures negative at less than 24 hours -awaiting read for echocardiogram  -f/u PT/OT recs (SNF) -consult to palliative care for GOC discussion   Type 2 diabetes, insulin-dependent: Patient on home regimen of insulin including 50 units twice daily and 15 units with meals.  Patient is also noted to be on 1000 mg twice daily Metformin.  Blood glucose ranges overnight 260s, received total of 5 units with sliding scale.  -Sensitive sliding scale insulin -Hemoglobin A1c 9.8  -Continue to monitor CBGs  -Start 10 units of Lantus   Hypertension Patient home medications include Lopressor 25 mg twice daily and valsartan-hydrochlorothiazide 160-12.5 mg daily.  Neurology consulted and recommended against permissive hypertension if stroke symptoms have been present for days, with a goal for patient to discharge normotensive range 97-147/64-81.  Blood pressure ranges overnight within normal limits, will continue to monitor with vitals. -Continue to monitor blood pressure -Continue home HCTZ 12.5 mg and irbesartan 150 while admitted  Left foot fifth metatarsal fracture with osteopenia Patient found to have a fifth metatarsal fracture on left foot x-ray. -Consult orthopedics   History of asthma Home medication include albuterol 2 puffs 4 times daily as needed -Continue albuterol as needed  FEN/GI: Heart healthy carb modified diet PPx: Lovenox 40  Disposition: Discharge pending completion of neurological work-up and improvement of mental status  Subjective:  Patient reports that she is doing well this morning and states that she has no pain although she is noted to withdraw her left foot to any palpation.  Patient denies any  shortness of breath, headache, vision changes.  Objective: Temp:  [97.6 F (36.4 C)-98.7 F (37.1 C)] 98.1  F (36.7 C) (10/22 1129) Pulse Rate:  [89-106] 89 (10/22 1129) Resp:  [14-20] 17 (10/22 1129) BP: (97-147)/(64-81) 130/65 (10/22 1129) SpO2:  [94 %-98 %] 94 % (10/22 1129)  Physical Exam: General: Female appearing stated age lying in bed with no acute distress  Cardiovascular: Regular rate and rhythm without murmurs or gallops or friction Respiratory: Clear to auscultation bilaterally without wheezing or crackles Abdomen: Soft, no tenderness, bowel sounds minimal Extremities: Left foot tenderness to palpation dorsal aspect, prior pigmentation noted medial 2 digits, dry skin noted in this area as well, patient withdraws foot to light palpation Neuro:  Patient is alert oriented to self, disoriented test to rule out she has been lost to location and city.  Patient exhibits weakness on left upper and lower extremity  Laboratory: Recent Labs  Lab 09/13/19 1257 09/13/19 1339 09/14/19 0227  WBC 9.5  --  10.3  HGB 14.5 15.0 13.1  HCT 44.8 44.0 39.7  PLT 370  --  331   Recent Labs  Lab 09/13/19 1257 09/13/19 1339 09/14/19 0227  NA 142 141 138  K 3.8 3.7 3.6  CL 104  --  104  CO2 26  --  24  BUN 31*  --  29*  CREATININE 0.55  --  0.60  CALCIUM 9.5  --  8.7*  PROT 8.0  --   --   BILITOT 0.7  --   --   ALKPHOS 78  --   --   ALT 27  --   --   AST 19  --   --   GLUCOSE 255*  --  296*     Imaging/Diagnostic Tests: Ct Angio Head W Or Wo Contrast  Result Date: 09/13/2019 CLINICAL DATA:  Initial evaluation for acute altered mental status. Known acute to subacute left frontal stroke. EXAM: CT ANGIOGRAPHY HEAD AND NECK TECHNIQUE: Multidetector CT imaging of the head and neck was performed using the standard protocol during bolus administration of intravenous contrast. Multiplanar CT image reconstructions and MIPs were obtained to evaluate the vascular anatomy. Carotid stenosis measurements (when applicable) are obtained utilizing NASCET criteria, using the distal internal carotid  diameter as the denominator. CONTRAST:  75mL OMNIPAQUE IOHEXOL 350 MG/ML SOLN COMPARISON:  Prior head CT and MRI from earlier the same day. FINDINGS: CTA NECK FINDINGS Aortic arch: Visualized aortic arch normal in caliber without aneurysm or other acute finding. Bovine arch with common origin of the right brachiocephalic and left common carotid artery noted. No hemodynamically significant stenosis seen about the origin of the great vessels. Visualized subclavian arteries patent without stenosis. Right carotid system: Right CCA patent from its origin to the bifurcation without stenosis. Eccentric soft plaque at the origin of the right ICA with mild 35% stenosis by NASCET criteria. Right ICA diminutive but otherwise patent to the skull base without additional stenosis or occlusion. Left carotid system: Left CCA patent from its origin to the bifurcation without stenosis. No significant atheromatous narrowing about the left bifurcation. Left ICA also diffusely diminutive but patent to the skull base without additional stenosis or occlusion. Vertebral arteries: Both vertebral arteries arise from the subclavian arteries. Left vertebral artery dominant and widely patent within the neck without stenosis, dissection, or occlusion. Right vertebral artery diffusely hypoplastic and is occluded at its origin, and remains largely occluded within the neck. Minimal scant distal reconstitution within the right V2 segment, which  subsequently re occludes at the V3 segment. Right vertebral is occluded as it courses into the cranial vault. Skeleton: No acute osseous abnormality. No discrete lytic or blastic osseous lesions. Moderate cervical spondylosis at C5-6. Other neck: No other acute soft tissue abnormality within the neck. Salivary glands within normal limits. Thyroid normal. Enlarged left level IB node measures 12 mm in short axis (series 5, image 100). Additional mildly enlarged 11 mm right submental node (series 5, image 108).  Findings are indeterminate, but could be reactive, although no acute inflammatory changes are seen within the neck. Upper chest: Visualized upper chest demonstrates no acute finding. Partially visualized lungs are grossly clear. Review of the MIP images confirms the above findings CTA HEAD FINDINGS Anterior circulation: ICAs markedly diminutive bilaterally, but patent as they course into the cranial vault. Petrous segments widely patent. Scattered atheromatous plaque within the cavernous/supraclinoid ICAs bilaterally. Associated moderate stenosis at the anterior genu of the cavernous left ICA. There is a severe near occlusive stenosis at the para clinoid right ICA (series 7, image 28). There is severe near occlusive stenosis at the right ICA terminus (series 7, image 22). Right A1 segment patent. Left ICA terminus is perfused. Occlusion of the left A1 segment at its origin, with some retrograde filling across the circle-of-Willis. Grossly normal anterior communicating artery complex. There is occlusion of the distal right A2 segment (series 7, image 98). Left ACA demonstrates multifocal severe stenoses and demonstrates markedly attenuated flow, although does remain patent to its distal aspect. Severe fairly long segment stenosis seen involving the left M1 segment (series 7, image 25). Left MCA remains severely stenotic nearly to the level of the bifurcation, with only faintly visible flow. Markedly attenuated flow is seen distally within the left MCA branches, which also demonstrate small vessel atheromatous irregularity. On the right, the severe stenosis involving the right ICA terminus extends into the proximal-mid right M1 segment which demonstrates markedly diminutive flow. Distal right M1 segment patent. Negative right MCA bifurcation. Distal right MCA branches are perfused, although demonstrates diffuse small vessel atheromatous irregularity. Posterior circulation: Dominant left vertebral artery demonstrates  scattered atheromatous irregularity but is patent to the vertebrobasilar junction without stenosis. Right vertebral occluded as it courses into the cranial vault. Retrograde filling of the distal right V4 segment two perfuse the right PICA. Left PICA not seen. Basilar patent to its distal aspect without stenosis. Superior cerebral arteries patent bilaterally. Both of the posterior cerebral arteries primarily supplied via the basilar. Left PCA perfused to its distal aspect without stenosis. Short-segment severe distal right P2 stenosis noted (series 10, image 25). Venous sinuses: Grossly patent, although not well assessed due to timing of the contrast bolus. Anatomic variants: None significant. Review of the MIP images confirms the above findings IMPRESSION: 1. 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 Secondary severely attenuated flow throughout both MCA distributions, left worse than right. 2. Occluded right A2 segment. Left ACA severely diseased and attenuated but remains patent. 3. Occluded left A1 segment. 4. Hypoplastic and occluded right vertebral artery. Dominant left vertebral artery patent without significant stenosis. Retrograde filling of the distal right V4 segment with perfusion of the right PICA. 5. Severe distal right P2 stenosis. 6. Otherwise relative patency of the major arterial vasculature of the neck, although the ICAs are diffusely diminutive and hypoplastic bilaterally. 7. Mildly enlarged level I and left level II lymph nodes as above, indeterminate, but could be reactive.  Electronically Signed   By: Rise MuBenjamin  McClintock M.D.   On: 09/13/2019 23:24   Dg Chest 2 View  Result Date: 09/13/2019 CLINICAL DATA:  Initial evaluation for acute altered mental status, history of asthma, former smoker. EXAM: CHEST - 2 VIEW COMPARISON:  Prior radiograph from 09/01/2017. FINDINGS:  Mild cardiomegaly, stable. Mediastinal silhouette within normal limits. Lungs hypoinflated. Mild diffuse pulmonary vascular congestion with interstitial prominence, suggesting pulmonary interstitial congestion. No frank pulmonary edema. No pleural effusion. No focal infiltrates. No pneumothorax. No acute osseous finding. IMPRESSION: 1. Cardiomegaly with mild diffuse pulmonary interstitial congestion without frank pulmonary edema. 2. No other active cardiopulmonary disease. Electronically Signed   By: Rise MuBenjamin  McClintock M.D.   On: 09/13/2019 20:23   Ct Head Wo Contrast  Result Date: 09/13/2019 CLINICAL DATA:  58 year old female with altered mental status. EXAM: CT HEAD WITHOUT CONTRAST TECHNIQUE: Contiguous axial images were obtained from the base of the skull through the vertex without intravenous contrast. COMPARISON:  None. FINDINGS: Brain: There is mild age-related atrophy. Large area of old infarct and encephalomalacia noted in the right MCA territory. There is associated mild ex vacuo dilatation of the right lateral ventricle. A focal area of low attenuation in the left frontal lobe abutting the frontal horn of the left lateral ventricle most likely represents an area of subacute or old infarct. Clinical correlation is recommended. MRI may provide better characterization if clinically indicated. There is no acute intracranial hemorrhage. No mass effect or midline shift. No extra-axial fluid collection. Vascular: No hyperdense vessel or unexpected calcification. Skull: Normal. Negative for fracture or focal lesion. Sinuses/Orbits: No acute finding. Other: None IMPRESSION: 1. No acute intracranial hemorrhage. 2. Large right MCA territory old infarct and encephalomalacia. 3. Probable focal area of subacute or old infarct in the left frontal lobe. Clinical correlation is recommended. Electronically Signed   By: Elgie CollardArash  Radparvar M.D.   On: 09/13/2019 14:48   Ct Angio Neck W Or Wo Contrast  Result Date:  09/13/2019 CLINICAL DATA:  Initial evaluation for acute altered mental status. Known acute to subacute left frontal stroke. EXAM: CT ANGIOGRAPHY HEAD AND NECK TECHNIQUE: Multidetector CT imaging of the head and neck was performed using the standard protocol during bolus administration of intravenous contrast. Multiplanar CT image reconstructions and MIPs were obtained to evaluate the vascular anatomy. Carotid stenosis measurements (when applicable) are obtained utilizing NASCET criteria, using the distal internal carotid diameter as the denominator. CONTRAST:  75mL OMNIPAQUE IOHEXOL 350 MG/ML SOLN COMPARISON:  Prior head CT and MRI from earlier the same day. FINDINGS: CTA NECK FINDINGS Aortic arch: Visualized aortic arch normal in caliber without aneurysm or other acute finding. Bovine arch with common origin of the right brachiocephalic and left common carotid artery noted. No hemodynamically significant stenosis seen about the origin of the great vessels. Visualized subclavian arteries patent without stenosis. Right carotid system: Right CCA patent from its origin to the bifurcation without stenosis. Eccentric soft plaque at the origin of the right ICA with mild 35% stenosis by NASCET criteria. Right ICA diminutive but otherwise patent to the skull base without additional stenosis or occlusion. Left carotid system: Left CCA patent from its origin to the bifurcation without stenosis. No significant atheromatous narrowing about the left bifurcation. Left ICA also diffusely diminutive but patent to the skull base without additional stenosis or occlusion. Vertebral arteries: Both vertebral arteries arise from the subclavian arteries. Left vertebral artery dominant and widely patent within the neck without stenosis, dissection, or occlusion. Right vertebral artery diffusely  hypoplastic and is occluded at its origin, and remains largely occluded within the neck. Minimal scant distal reconstitution within the right V2  segment, which subsequently re occludes at the V3 segment. Right vertebral is occluded as it courses into the cranial vault. Skeleton: No acute osseous abnormality. No discrete lytic or blastic osseous lesions. Moderate cervical spondylosis at C5-6. Other neck: No other acute soft tissue abnormality within the neck. Salivary glands within normal limits. Thyroid normal. Enlarged left level IB node measures 12 mm in short axis (series 5, image 100). Additional mildly enlarged 11 mm right submental node (series 5, image 108). Findings are indeterminate, but could be reactive, although no acute inflammatory changes are seen within the neck. Upper chest: Visualized upper chest demonstrates no acute finding. Partially visualized lungs are grossly clear. Review of the MIP images confirms the above findings CTA HEAD FINDINGS Anterior circulation: ICAs markedly diminutive bilaterally, but patent as they course into the cranial vault. Petrous segments widely patent. Scattered atheromatous plaque within the cavernous/supraclinoid ICAs bilaterally. Associated moderate stenosis at the anterior genu of the cavernous left ICA. There is a severe near occlusive stenosis at the para clinoid right ICA (series 7, image 28). There is severe near occlusive stenosis at the right ICA terminus (series 7, image 22). Right A1 segment patent. Left ICA terminus is perfused. Occlusion of the left A1 segment at its origin, with some retrograde filling across the circle-of-Willis. Grossly normal anterior communicating artery complex. There is occlusion of the distal right A2 segment (series 7, image 98). Left ACA demonstrates multifocal severe stenoses and demonstrates markedly attenuated flow, although does remain patent to its distal aspect. Severe fairly long segment stenosis seen involving the left M1 segment (series 7, image 25). Left MCA remains severely stenotic nearly to the level of the bifurcation, with only faintly visible flow.  Markedly attenuated flow is seen distally within the left MCA branches, which also demonstrate small vessel atheromatous irregularity. On the right, the severe stenosis involving the right ICA terminus extends into the proximal-mid right M1 segment which demonstrates markedly diminutive flow. Distal right M1 segment patent. Negative right MCA bifurcation. Distal right MCA branches are perfused, although demonstrates diffuse small vessel atheromatous irregularity. Posterior circulation: Dominant left vertebral artery demonstrates scattered atheromatous irregularity but is patent to the vertebrobasilar junction without stenosis. Right vertebral occluded as it courses into the cranial vault. Retrograde filling of the distal right V4 segment two perfuse the right PICA. Left PICA not seen. Basilar patent to its distal aspect without stenosis. Superior cerebral arteries patent bilaterally. Both of the posterior cerebral arteries primarily supplied via the basilar. Left PCA perfused to its distal aspect without stenosis. Short-segment severe distal right P2 stenosis noted (series 10, image 25). Venous sinuses: Grossly patent, although not well assessed due to timing of the contrast bolus. Anatomic variants: None significant. Review of the MIP images confirms the above findings IMPRESSION: 1. 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 Secondary severely attenuated flow throughout both MCA distributions, left worse than right. 2. Occluded right A2 segment. Left ACA severely diseased and attenuated but remains patent. 3. Occluded left A1 segment. 4. Hypoplastic and occluded right vertebral artery. Dominant left vertebral artery patent without significant stenosis. Retrograde filling of the distal right V4 segment with perfusion of the right PICA. 5. Severe distal right P2 stenosis. 6. Otherwise relative  patency of the major arterial vasculature of the neck, although the  ICAs are diffusely diminutive and hypoplastic bilaterally. 7. Mildly enlarged level I and left level II lymph nodes as above, indeterminate, but could be reactive. Electronically Signed   By: Jeannine Boga M.D.   On: 09/13/2019 23:24   Mr Brain Wo Contrast  Result Date: 09/13/2019 CLINICAL DATA:  Initial evaluation for acute altered mental status, stroke suspected. EXAM: MRI HEAD WITHOUT CONTRAST TECHNIQUE: Multiplanar, multiecho pulse sequences of the brain and surrounding structures were obtained without intravenous contrast. COMPARISON:  Prior head CT from earlier the same day. FINDINGS: Brain: Examination moderately degraded by motion artifact. Generalized age-related cerebral atrophy. Large area of encephalomalacia and gliosis involving the right frontotemporal region and right basal ganglia compatible with chronic right MCA territory infarct. Superimposed areas of chronic hemosiderin staining. Associated wallerian degeneration at the right cerebral peduncle. Confluent area of restricted diffusion involving the left anterior genu of the corpus callosum with extension into the overlying subcortical left frontal lobe, compatible with acute to early subacute left ACA territory infarct. Finding corresponds with hypodensity on prior CT. No associated hemorrhage or significant mass effect. No other evidence for acute or subacute ischemia. No other areas of chronic cortical infarction. No acute intracranial hemorrhage. No mass lesion, midline shift or mass effect mild ex vacuo dilatation right lateral ventricle related to the chronic right MCA territory infarct. No hydrocephalus. No extra-axial fluid collection. Pituitary gland suprasellar region normal. Vascular: Major intracranial vascular flow voids are grossly maintained at the skull base. Skull and upper cervical spine: Craniocervical junction within normal limits. No focal marrow  replacing lesion. No scalp soft tissue abnormality. Sinuses/Orbits: Globes and orbital soft tissues within normal limits. Paranasal sinuses are largely clear. No mastoid effusion. Other: None. IMPRESSION: 1. Moderate-sized acute to early subacute left ACA territory infarct. No associated hemorrhage or mass effect. 2. Large chronic right MCA territory infarct. 3. Underlying age-related cerebral atrophy. Electronically Signed   By: Jeannine Boga M.D.   On: 09/13/2019 19:39   Dg Foot 2 Views Left  Result Date: 09/13/2019 CLINICAL DATA:  Initial evaluation for acute left foot pain. EXAM: LEFT FOOT - 2 VIEW COMPARISON:  None. FINDINGS: There is an acute minimally displaced fracture through the base of the left fifth metatarsal. No other acute fracture dislocation. 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 Osteopenia. No soft tissue abnormality. IMPRESSION: 1. Acute minimally displaced fracture through the base of the left fifth metatarsal. 2. Osteopenia. Electronically Signed   By: Jeannine Boga M.D.   On: 09/13/2019 20:25     Stark Klein, MD 09/14/2019, 12:06 PM PGY-1, [place] Intern pager: [phone], text pages welcome

## 2019-09-14 NOTE — ED Notes (Signed)
ED TO INPATIENT HANDOFF REPORT  ED Nurse Name and Phone #: Maryruth Hancock RN  S Name/Age/Gender Christina Evans 58 y.o. female Room/Bed: 042C/042C  Code Status   Code Status: Full Code  Home/SNF/Other Skilled nursing facility Patient oriented to: self, place and situation Is this baseline? Pt is alert and oriented, was told by prior RN that she can become confused  Triage Complete: Triage complete  Chief Complaint confusion and ams  Triage Note Family reports pt was found at home covered in urine and altered. LSN week. Unsure if taking too much metformin or none. 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 Pt lives 2 hours away and brought in by daughter.    Allergies Allergies  Allergen Reactions  . Tape Itching and Other (See Comments)    Causes a lot of scratching, also    Level of Care/Admitting Diagnosis ED Disposition    ED Disposition Condition Comment   Admit  Hospital Area: [facility] [[id number]]  Level of Care: Telemetry Medical [104]  Covid Evaluation: Asymptomatic Screening Protocol (No Symptoms)  Diagnosis: AMS (altered mental status) [1610960]  Admitting Physician: Mirian Mo [[id number]]  Attending Physician: Doralee Albino A [[id number]  PT Class (Do Not Modify): Observation [104]  PT Acc Code (Do Not Modify): Observation [10022]       B Medical/Surgery History Past Medical History:  Diagnosis Date  . Asthma   . CVA (cerebral vascular accident) (HCC)   . Diabetes mellitus without complication [facility])    Past Surgical History:  Procedure Laterality Date  . NO PAST SURGERIES       A IV Location/Drains/Wounds Patient Lines/Drains/Airways Status   Active Line/Drains/Airways    Name:   Placement date:   Placement time:   Site:   Days:   Peripheral IV 09/13/19 Right Antecubital   09/13/19    1408    Antecubital   1          Intake/Output Last 24 hours No intake or output data in the 24 hours ending 09/14/19  0016  Labs/Imaging Results for orders placed or performed during the hospital encounter of 09/13/19 (from the past 48 hour(s))  CBG monitoring, ED     Status: Abnormal   Collection Time: 09/13/19 12:54 PM  Result Value Ref Range   Glucose-Capillary 265 (H) 70 - 99 mg/dL   Comment 1 Notify RN    Comment 2 Document in Chart   Comprehensive metabolic panel     Status: Abnormal   Collection Time: 09/13/19 12:57 PM  Result Value Ref Range   Sodium 142 135 - 145 mmol/L   Potassium 3.8 3.5 - 5.1 mmol/L   Chloride 104 98 - 111 mmol/L   CO2 26 22 - 32 mmol/L   Glucose, Bld 255 (H) 70 - 99 mg/dL   BUN 31 (H) 6 - 20 mg/dL   Creatinine, Ser 1.91 0.44 - 1.00 mg/dL   Calcium 9.5 8.9 - 47.8 mg/dL   Total Protein 8.0 6.5 - 8.1 g/dL   Albumin 3.9 3.5 - 5.0 g/dL   AST 19 15 - 41 U/L   ALT 27 0 - 44 U/L   Alkaline Phosphatase 78 38 - 126 U/L   Total Bilirubin 0.7 0.3 - 1.2 mg/dL   GFR calc non Af Amer >60 >60 mL/min   GFR calc Af Amer >60 >60 mL/min   Anion gap 12 5 - 15    Comment: Performed at [facility], [address] [place], [place] [address]  CBC     Status: None   Collection Time: 09/13/19 12:57 PM  Result Value Ref Range   WBC 9.5 4.0 - 10.5 K/uL   RBC 4.81 3.87 - 5.11 MIL/uL   Hemoglobin 14.5 12.0 - 15.0 g/dL   HCT 60.444.8 54.036.0 - 98.146.0 %   MCV 93.1 80.0 - 100.0 fL   MCH 30.1 26.0 - 34.0 pg   MCHC 32.4 30.0 - 36.0 g/dL   RDW 19.112.4 47.811.5 - 29.515.5 %   Platelets 370 150 - 400 K/uL   nRBC 0.0 0.0 - 0.2 %    Comment: Performed at [facility], [address] [place], [place] [address]  Ethanol     Status: None   Collection Time: 09/13/19  1:17 PM  Result Value Ref Range   Alcohol, Ethyl (B) <10 <10 mg/dL    Comment: (NOTE) Lowest detectable limit for serum alcohol is 10 mg/dL. For medical purposes only. Performed at [facility], [address] [place], [place] [address]   Lipase, blood     Status: None   Collection Time: 09/13/19  1:17 PM  Result Value Ref  Range   Lipase 32 11 - 51 U/L    Comment: Performed at [facility], [address] [place], [place] [address]  Troponin I (High Sensitivity)     Status: None   Collection Time: 09/13/19  1:17 PM  Result Value Ref Range   Troponin I (High Sensitivity) 3 <18 ng/L    Comment: (NOTE) Elevated high sensitivity troponin I (hsTnI) values and significant  changes across serial measurements may suggest ACS but many other  chronic and acute conditions are known to elevate hsTnI results.  Refer to the "Links" section for chest pain algorithms and additional  guidance. Performed at [facility], [address] [place], [place] [address]   Protime-INR     Status: None   Collection Time: 09/13/19  1:17 PM  Result Value Ref Range   Prothrombin Time 12.4 11.4 - 15.2 seconds   INR 0.9 0.8 - 1.2    Comment: (NOTE) INR goal varies based on device and disease states. Performed at [facility], [address] [place], [place] [address]   CK     Status: None   Collection Time: 09/13/19  1:17 PM  Result Value Ref Range   Total CK 220 38 - 234 U/L    Comment: Performed at [facility], [address] [place], [place] [address]  Magnesium     Status: None   Collection Time: 09/13/19  1:17 PM  Result Value Ref Range   Magnesium 2.2 1.7 - 2.4 mg/dL    Comment: Performed at [facility], [address] [place], [place] [address]  Phosphorus     Status: None   Collection Time: 09/13/19  1:17 PM  Result Value Ref Range   Phosphorus 4.0 2.5 - 4.6 mg/dL    Comment: Performed at [facility], [address] [place], [place] [address]  POCT I-Stat EG7     Status: Abnormal   Collection Time: 09/13/19  1:39 PM  Result Value Ref Range   pH, Ven 7.364 7.250 - 7.430   pCO2, Ven 49.6 44.0 - 60.0 mmHg   pO2, Ven 40.0 32.0 - 45.0 mmHg   Bicarbonate 28.3 (H) 20.0 - 28.0 mmol/L   TCO2 30 22 - 32 mmol/L   O2  Saturation 73.0 %   Acid-Base Excess 2.0 0.0 - 2.0 mmol/L    Sodium 141 135 - 145 mmol/L   Potassium 3.7 3.5 - 5.1 mmol/L   Calcium, Ion 1.16 1.15 - 1.40 mmol/L   HCT 44.0 36.0 - 46.0 %   Hemoglobin 15.0 12.0 - 15.0 g/dL   Patient temperature HIDE    Sample type VENOUS   I-Stat beta hCG blood, ED     Status: None   Collection Time: 09/13/19  2:01 PM  Result Value Ref Range   I-stat hCG, quantitative <5.0 <5 mIU/mL   Comment 3            Comment:   GEST. AGE      CONC.  (mIU/mL)   <=1 WEEK        5 - 50     2 WEEKS       50 - 500     3 WEEKS       100 - 10,000     4 WEEKS     1,000 - 30,000        FEMALE AND NON-PREGNANT FEMALE:     LESS THAN 5 mIU/mL   Lactic acid, plasma     Status: None   Collection Time: 09/13/19  2:10 PM  Result Value Ref Range   Lactic Acid, Venous 1.5 0.5 - 1.9 mmol/L    Comment: Performed at [facility], [address], [place], [place] [address]  Urinalysis, Routine w reflex microscopic     Status: Abnormal   Collection Time: 09/13/19  2:10 PM  Result Value Ref Range   Color, Urine YELLOW YELLOW   APPearance HAZY (A) CLEAR   Specific Gravity, Urine 1.011 1.005 - 1.030   pH 6.0 5.0 - 8.0   Glucose, UA NEGATIVE NEGATIVE mg/dL   Hgb urine dipstick NEGATIVE NEGATIVE   Bilirubin Urine NEGATIVE NEGATIVE   Ketones, ur NEGATIVE NEGATIVE mg/dL   Protein, ur NEGATIVE NEGATIVE mg/dL   Nitrite NEGATIVE NEGATIVE   Leukocytes,Ua SMALL (A) NEGATIVE   RBC / HPF 0-5 0 - 5 RBC/hpf   WBC, UA 11-20 0 - 5 WBC/hpf   Bacteria, UA RARE (A) NONE SEEN   Squamous Epithelial / LPF 6-10 0 - 5   Mucus PRESENT     Comment: Performed at [facility], [address] [place], [place] [address]  Ammonia     Status: None   Collection Time: 09/13/19  2:10 PM  Result Value Ref Range   Ammonia 12 9 - 35 umol/L    Comment: Performed at [facility], [address] [place], [place] [address]  TSH     Status: None   Collection Time: 09/13/19  2:10 PM  Result Value Ref Range   TSH 1.083 0.350 - 4.500 uIU/mL    Comment:  Performed by a 3rd Generation assay with a functional sensitivity of <=0.01 uIU/mL. Performed at [facility], [address], [place], [place] [address]   Urinalysis, Routine w reflex microscopic     Status: Abnormal   Collection Time: 09/13/19  2:10 PM  Result Value Ref Range   Color, Urine YELLOW YELLOW   APPearance HAZY (A) CLEAR   Specific Gravity, Urine 1.035 (H) 1.005 - 1.030   pH 5.0 5.0 - 8.0   Glucose, UA >=500 (A) NEGATIVE mg/dL   Hgb urine dipstick NEGATIVE NEGATIVE   Bilirubin Urine NEGATIVE NEGATIVE   Ketones, ur NEGATIVE NEGATIVE mg/dL  Protein, ur NEGATIVE NEGATIVE mg/dL   Nitrite NEGATIVE NEGATIVE   Leukocytes,Ua NEGATIVE NEGATIVE   RBC / HPF 0-5 0 - 5 RBC/hpf   WBC, UA 0-5 0 - 5 WBC/hpf   Bacteria, UA RARE (A) NONE SEEN   Squamous Epithelial / LPF 11-20 0 - 5   Mucus PRESENT     Comment: Performed at [facility], [place] [address] [place], [place] [address]  SARS CORONAVIRUS 2 (TAT 6-24 HRS) Nasopharyngeal Nasopharyngeal Swab     Status: None   Collection Time: 09/13/19  4:13 PM   Specimen: Nasopharyngeal Swab  Result Value Ref Range   SARS Coronavirus 2 NEGATIVE NEGATIVE    Comment: (NOTE) SARS-CoV-2 target nucleic acids are NOT DETECTED. The SARS-CoV-2 RNA is generally detectable in upper and lower respiratory specimens during the acute phase of infection. Negative results do not preclude SARS-CoV-2 infection, do not rule out co-infections with other pathogens, and should not be used as the sole basis for treatment or other patient management decisions. Negative results must be combined with clinical observations, patient history, and epidemiological information. The expected result is Negative. Fact Sheet for Patients: SugarRoll.be Fact Sheet for Healthcare Providers: https://www.woods-mathews.com/ This test is not yet approved or cleared by the [place] FDA and  has been authorized for detection  and/or diagnosis of SARS-CoV-2 by FDA under an Emergency Use Authorization (EUA). This EUA will remain  in effect (meaning this test can be used) for the duration of the COVID-19 declaration under Section 56 4(b)(1) of the Act, 21 U.S.C. section 360bbb-3(b)(1), unless the authorization is terminated or revoked sooner. Performed at [facility], [place] [address], [place], [place] [address]   HIV Antibody (routine testing w rflx)     Status: None   Collection Time: 09/13/19  6:04 PM  Result Value Ref Range   HIV Screen 4th Generation wRfx NON REACTIVE NON REACTIVE    Comment: Performed at [facility], [place] [address] [place], [place] [address]  TSH     Status: None   Collection Time: 09/13/19  6:05 PM  Result Value Ref Range   TSH 1.423 0.350 - 4.500 uIU/mL    Comment: Performed by a 3rd Generation assay with a functional sensitivity of <=0.01 uIU/mL. Performed at [facility], [place] [address], [place], [place] [address]   Troponin I (High Sensitivity)     Status: None   Collection Time: 09/13/19  6:40 PM  Result Value Ref Range   Troponin I (High Sensitivity) 3 <18 ng/L    Comment: (NOTE) Elevated high sensitivity troponin I (hsTnI) values and significant  changes across serial measurements may suggest ACS but many other  chronic and acute conditions are known to elevate hsTnI results.  Refer to the "Links" section for chest pain algorithms and additional  guidance. Performed at [facility], [place] [address] [place], [place] [address]   Hemoglobin A1c     Status: Abnormal   Collection Time: 09/13/19  6:42 PM  Result Value Ref Range   Hgb A1c MFr Bld 9.8 (H) 4.8 - 5.6 %    Comment: (NOTE) Pre diabetes:          5.7%-6.4% Diabetes:              >6.4% Glycemic control for   <7.0% adults with diabetes    Mean Plasma Glucose 234.56 mg/dL    Comment: Performed at [place],  [place] [address]  Rapid urine drug screen  (hospital performed)     Status: None   Collection Time: 09/13/19  6:45 PM  Result Value Ref Range   Opiates NONE DETECTED NONE DETECTED   Cocaine NONE DETECTED NONE DETECTED   Benzodiazepines NONE DETECTED NONE DETECTED   Amphetamines NONE DETECTED NONE DETECTED   Tetrahydrocannabinol NONE DETECTED NONE DETECTED   Barbiturates NONE DETECTED NONE DETECTED    Comment: (NOTE) DRUG SCREEN FOR MEDICAL PURPOSES ONLY.  IF CONFIRMATION IS NEEDED FOR ANY PURPOSE, NOTIFY LAB WITHIN 5 DAYS. LOWEST DETECTABLE LIMITS FOR URINE DRUG SCREEN Drug Class                     Cutoff (ng/mL) Amphetamine and metabolites    1000 Barbiturate and metabolites    200 Benzodiazepine                 200 Tricyclics and metabolites     300 Opiates and metabolites        300 Cocaine and metabolites        300 THC                            50 Performed at [facility], [address] [place], [place] [address]    Ct Angio Head W Or Wo Contrast  Result Date: 09/13/2019 CLINICAL DATA:  Initial evaluation for acute altered mental status. Known acute to subacute left frontal stroke. EXAM: CT ANGIOGRAPHY HEAD AND NECK TECHNIQUE: Multidetector CT imaging of the head and neck was performed using the standard protocol during bolus administration of intravenous contrast. Multiplanar CT image reconstructions and MIPs were obtained to evaluate the vascular anatomy. Carotid stenosis measurements (when applicable) are obtained utilizing NASCET criteria, using the distal internal carotid diameter as the denominator. CONTRAST:  26mL OMNIPAQUE IOHEXOL 350 MG/ML SOLN COMPARISON:  Prior head CT and MRI from earlier the same day. FINDINGS: CTA NECK FINDINGS Aortic arch: Visualized aortic arch normal in caliber without aneurysm or other acute finding. Bovine arch with common origin of the right brachiocephalic and left common carotid artery noted. No hemodynamically significant stenosis seen about the origin of the great  vessels. Visualized subclavian arteries patent without stenosis. Right carotid system: Right CCA patent from its origin to the bifurcation without stenosis. Eccentric soft plaque at the origin of the right ICA with mild 35% stenosis by NASCET criteria. Right ICA diminutive but otherwise patent to the skull base without additional stenosis or occlusion. Left carotid system: Left CCA patent from its origin to the bifurcation without stenosis. No significant atheromatous narrowing about the left bifurcation. Left ICA also diffusely diminutive but patent to the skull base without additional stenosis or occlusion. Vertebral arteries: Both vertebral arteries arise from the subclavian arteries. Left vertebral artery dominant and widely patent within the neck without stenosis, dissection, or occlusion. Right vertebral artery diffusely hypoplastic and is occluded at its origin, and remains largely occluded within the neck. Minimal scant distal reconstitution within the right V2 segment, which subsequently re occludes at the V3 segment. Right vertebral is occluded as it courses into the cranial vault. Skeleton: No acute osseous abnormality. No discrete lytic or blastic osseous lesions. Moderate cervical spondylosis at C5-6. Other neck: No other acute soft tissue abnormality within the neck. Salivary glands within normal limits. Thyroid normal. Enlarged left level IB node measures 12 mm in short axis (series 5, image 100). Additional mildly enlarged  11 mm right submental node (series 5, image 108). Findings are indeterminate, but could be reactive, although no acute inflammatory changes are seen within the neck. Upper chest: Visualized upper chest demonstrates no acute finding. Partially visualized lungs are grossly clear. Review of the MIP images confirms the above findings CTA HEAD FINDINGS Anterior circulation: ICAs markedly diminutive bilaterally, but patent as they course into the cranial vault. Petrous segments widely  patent. Scattered atheromatous plaque within the cavernous/supraclinoid ICAs bilaterally. Associated moderate stenosis at the anterior genu of the cavernous left ICA. There is a severe near occlusive stenosis at the para clinoid right ICA (series 7, image 28). There is severe near occlusive stenosis at the right ICA terminus (series 7, image 22). Right A1 segment patent. Left ICA terminus is perfused. Occlusion of the left A1 segment at its origin, with some retrograde filling across the circle-of-Willis. Grossly normal anterior communicating artery complex. There is occlusion of the distal right A2 segment (series 7, image 98). Left ACA demonstrates multifocal severe stenoses and demonstrates markedly attenuated flow, although does remain patent to its distal aspect. Severe fairly long segment stenosis seen involving the left M1 segment (series 7, image 25). Left MCA remains severely stenotic nearly to the level of the bifurcation, with only faintly visible flow. Markedly attenuated flow is seen distally within the left MCA branches, which also demonstrate small vessel atheromatous irregularity. On the right, the severe stenosis involving the right ICA terminus extends into the proximal-mid right M1 segment which demonstrates markedly diminutive flow. Distal right M1 segment patent. Negative right MCA bifurcation. Distal right MCA branches are perfused, although demonstrates diffuse small vessel atheromatous irregularity. Posterior circulation: Dominant left vertebral artery demonstrates scattered atheromatous irregularity but is patent to the vertebrobasilar junction without stenosis. Right vertebral occluded as it courses into the cranial vault. Retrograde filling of the distal right V4 segment two perfuse the right PICA. Left PICA not seen. Basilar patent to its distal aspect without stenosis. Superior cerebral arteries patent bilaterally. Both of the posterior cerebral arteries primarily supplied via the  basilar. Left PCA perfused to its distal aspect without stenosis. Short-segment severe distal right P2 stenosis noted (series 10, image 25). Venous sinuses: Grossly patent, although not well assessed due to timing of the contrast bolus. Anatomic variants: None significant. Review of the MIP images confirms the above findings IMPRESSION: 1. 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 Secondary severely attenuated flow throughout both MCA distributions, left worse than right. 2. Occluded right A2 segment. Left ACA severely diseased and attenuated but remains patent. 3. Occluded left A1 segment. 4. Hypoplastic and occluded right vertebral artery. Dominant left vertebral artery patent without significant stenosis. Retrograde filling of the distal right V4 segment with perfusion of the right PICA. 5. Severe distal right P2 stenosis. 6. Otherwise relative patency of the major arterial vasculature of the neck, although the ICAs are diffusely diminutive and hypoplastic bilaterally. 7. Mildly enlarged level I and left level II lymph nodes as above, indeterminate, but could be reactive. Electronically Signed   By: Rise Mu M.D.   On: 09/13/2019 23:24   Dg Chest 2 View  Result Date: 09/13/2019 CLINICAL DATA:  Initial evaluation for acute altered mental status, history of asthma, former smoker. EXAM: CHEST - 2 VIEW COMPARISON:  Prior radiograph from 09/01/2017. FINDINGS: Mild cardiomegaly, stable. Mediastinal silhouette within normal limits. Lungs hypoinflated. Mild diffuse pulmonary vascular congestion with interstitial prominence, suggesting pulmonary  interstitial congestion. No frank pulmonary edema. No pleural effusion. No focal infiltrates. No pneumothorax. No acute osseous finding. IMPRESSION: 1. Cardiomegaly with mild diffuse pulmonary interstitial congestion without frank pulmonary  edema. 2. No other active cardiopulmonary disease. Electronically Signed   By: Rise Mu M.D.   On: 09/13/2019 20:23   Ct Head Wo Contrast  Result Date: 09/13/2019 CLINICAL DATA:  58 year old female with altered mental status. EXAM: CT HEAD WITHOUT CONTRAST TECHNIQUE: Contiguous axial images were obtained from the base of the skull through the vertex without intravenous contrast. COMPARISON:  None. FINDINGS: Brain: There is mild age-related atrophy. Large area of old infarct and encephalomalacia noted in the right MCA territory. There is associated mild ex vacuo dilatation of the right lateral ventricle. A focal area of low attenuation in the left frontal lobe abutting the frontal horn of the left lateral ventricle most likely represents an area of subacute or old infarct. Clinical correlation is recommended. MRI may provide better characterization if clinically indicated. There is no acute intracranial hemorrhage. No mass effect or midline shift. No extra-axial fluid collection. Vascular: No hyperdense vessel or unexpected calcification. Skull: Normal. Negative for fracture or focal lesion. Sinuses/Orbits: No acute finding. Other: None IMPRESSION: 1. No acute intracranial hemorrhage. 2. Large right MCA territory old infarct and encephalomalacia. 3. Probable focal area of subacute or old infarct in the left frontal lobe. Clinical correlation is recommended. Electronically Signed   By: Elgie Collard M.D.   On: 09/13/2019 14:48   Ct Angio Neck W Or Wo Contrast  Result Date: 09/13/2019 CLINICAL DATA:  Initial evaluation for acute altered mental status. Known acute to subacute left frontal stroke. EXAM: CT ANGIOGRAPHY HEAD AND NECK TECHNIQUE: Multidetector CT imaging of the head and neck was performed using the standard protocol during bolus administration of intravenous contrast. Multiplanar CT image reconstructions and MIPs were obtained to evaluate the vascular anatomy. Carotid stenosis  measurements (when applicable) are obtained utilizing NASCET criteria, using the distal internal carotid diameter as the denominator. CONTRAST:  75mL OMNIPAQUE IOHEXOL 350 MG/ML SOLN COMPARISON:  Prior head CT and MRI from earlier the same day. FINDINGS: CTA NECK FINDINGS Aortic arch: Visualized aortic arch normal in caliber without aneurysm or other acute finding. Bovine arch with common origin of the right brachiocephalic and left common carotid artery noted. No hemodynamically significant stenosis seen about the origin of the great vessels. Visualized subclavian arteries patent without stenosis. Right carotid system: Right CCA patent from its origin to the bifurcation without stenosis. Eccentric soft plaque at the origin of the right ICA with mild 35% stenosis by NASCET criteria. Right ICA diminutive but otherwise patent to the skull base without additional stenosis or occlusion. Left carotid system: Left CCA patent from its origin to the bifurcation without stenosis. No significant atheromatous narrowing about the left bifurcation. Left ICA also diffusely diminutive but patent to the skull base without additional stenosis or occlusion. Vertebral arteries: Both vertebral arteries arise from the subclavian arteries. Left vertebral artery dominant and widely patent within the neck without stenosis, dissection, or occlusion. Right vertebral artery diffusely hypoplastic and is occluded at its origin, and remains largely occluded within the neck. Minimal scant distal reconstitution within the right V2 segment, which subsequently re occludes at the V3 segment. Right vertebral is occluded as it courses into the cranial vault. Skeleton: No acute osseous abnormality. No discrete lytic or blastic osseous lesions. Moderate cervical spondylosis at C5-6. Other neck: No other acute soft tissue abnormality within the neck. Salivary  glands within normal limits. Thyroid normal. Enlarged left level IB node measures 12 mm in short  axis (series 5, image 100). Additional mildly enlarged 11 mm right submental node (series 5, image 108). Findings are indeterminate, but could be reactive, although no acute inflammatory changes are seen within the neck. Upper chest: Visualized upper chest demonstrates no acute finding. Partially visualized lungs are grossly clear. Review of the MIP images confirms the above findings CTA HEAD FINDINGS Anterior circulation: ICAs markedly diminutive bilaterally, but patent as they course into the cranial vault. Petrous segments widely patent. Scattered atheromatous plaque within the cavernous/supraclinoid ICAs bilaterally. Associated moderate stenosis at the anterior genu of the cavernous left ICA. There is a severe near occlusive stenosis at the para clinoid right ICA (series 7, image 28). There is severe near occlusive stenosis at the right ICA terminus (series 7, image 22). Right A1 segment patent. Left ICA terminus is perfused. Occlusion of the left A1 segment at its origin, with some retrograde filling across the circle-of-Willis. Grossly normal anterior communicating artery complex. There is occlusion of the distal right A2 segment (series 7, image 98). Left ACA demonstrates multifocal severe stenoses and demonstrates markedly attenuated flow, although does remain patent to its distal aspect. Severe fairly long segment stenosis seen involving the left M1 segment (series 7, image 25). Left MCA remains severely stenotic nearly to the level of the bifurcation, with only faintly visible flow. Markedly attenuated flow is seen distally within the left MCA branches, which also demonstrate small vessel atheromatous irregularity. On the right, the severe stenosis involving the right ICA terminus extends into the proximal-mid right M1 segment which demonstrates markedly diminutive flow. Distal right M1 segment patent. Negative right MCA bifurcation. Distal right MCA branches are perfused, although demonstrates diffuse  small vessel atheromatous irregularity. Posterior circulation: Dominant left vertebral artery demonstrates scattered atheromatous irregularity but is patent to the vertebrobasilar junction without stenosis. Right vertebral occluded as it courses into the cranial vault. Retrograde filling of the distal right V4 segment two perfuse the right PICA. Left PICA not seen. Basilar patent to its distal aspect without stenosis. Superior cerebral arteries patent bilaterally. Both of the posterior cerebral arteries primarily supplied via the basilar. Left PCA perfused to its distal aspect without stenosis. Short-segment severe distal right P2 stenosis noted (series 10, image 25). Venous sinuses: Grossly patent, although not well assessed due to timing of the contrast bolus. Anatomic variants: None significant. Review of the MIP images confirms the above findings IMPRESSION: 1. 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 Secondary severely attenuated flow throughout both MCA distributions, left worse than right. 2. Occluded right A2 segment. Left ACA severely diseased and attenuated but remains patent. 3. Occluded left A1 segment. 4. Hypoplastic and occluded right vertebral artery. Dominant left vertebral artery patent without significant stenosis. Retrograde filling of the distal right V4 segment with perfusion of the right PICA. 5. Severe distal right P2 stenosis. 6. Otherwise relative patency of the major arterial vasculature of the neck, although the ICAs are diffusely diminutive and hypoplastic bilaterally. 7. Mildly enlarged level I and left level II lymph nodes as above, indeterminate, but could be reactive. Electronically Signed   By: Rise Mu M.D.   On: 09/13/2019 23:24   Mr Brain Wo Contrast  Result Date: 09/13/2019 CLINICAL DATA:  Initial evaluation for acute altered mental status,  stroke suspected. EXAM: MRI HEAD WITHOUT CONTRAST TECHNIQUE: Multiplanar, multiecho pulse sequences  of the brain and surrounding structures were obtained without intravenous contrast. COMPARISON:  Prior head CT from earlier the same day. FINDINGS: Brain: Examination moderately degraded by motion artifact. Generalized age-related cerebral atrophy. Large area of encephalomalacia and gliosis involving the right frontotemporal region and right basal ganglia compatible with chronic right MCA territory infarct. Superimposed areas of chronic hemosiderin staining. Associated wallerian degeneration at the right cerebral peduncle. Confluent area of restricted diffusion involving the left anterior genu of the corpus callosum with extension into the overlying subcortical left frontal lobe, compatible with acute to early subacute left ACA territory infarct. Finding corresponds with hypodensity on prior CT. No associated hemorrhage or significant mass effect. No other evidence for acute or subacute ischemia. No other areas of chronic cortical infarction. No acute intracranial hemorrhage. No mass lesion, midline shift or mass effect mild ex vacuo dilatation right lateral ventricle related to the chronic right MCA territory infarct. No hydrocephalus. No extra-axial fluid collection. Pituitary gland suprasellar region normal. Vascular: Major intracranial vascular flow voids are grossly maintained at the skull base. Skull and upper cervical spine: Craniocervical junction within normal limits. No focal marrow replacing lesion. No scalp soft tissue abnormality. Sinuses/Orbits: Globes and orbital soft tissues within normal limits. Paranasal sinuses are largely clear. No mastoid effusion. Other: None. IMPRESSION: 1. Moderate-sized acute to early subacute left ACA territory infarct. No associated hemorrhage or mass effect. 2. Large chronic right MCA territory infarct. 3. Underlying age-related cerebral atrophy. Electronically Signed   By:  Rise Mu M.D.   On: 09/13/2019 19:39   Dg Foot 2 Views Left  Result Date: 09/13/2019 CLINICAL DATA:  Initial evaluation for acute left foot pain. EXAM: LEFT FOOT - 2 VIEW COMPARISON:  None. FINDINGS: There is an acute minimally displaced fracture through the base of the left fifth metatarsal. No other acute fracture dislocation. 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 Osteopenia. No soft tissue abnormality. IMPRESSION: 1. Acute minimally displaced fracture through the base of the left fifth metatarsal. 2. Osteopenia. Electronically Signed   By: Rise Mu M.D.   On: 09/13/2019 20:25    Pending Labs Unresulted Labs (From admission, onward)    Start     Ordered   09/14/19 0500  Basic metabolic panel  Tomorrow morning,   R     09/13/19 1805   09/14/19 0500  CBC  Tomorrow morning,   R     09/13/19 1805   09/14/19 0500  Lipid panel  Tomorrow morning,   R    Comments: Fasting    09/13/19 1841   09/14/19 0500  Uric acid  Tomorrow morning,   R     09/13/19 1858   09/14/19 0500  Lupus anticoagulant panel  (Hypercoagulable Panel, Comprehensive ([facility]))  Tomorrow morning,   R     09/13/19 2129   09/14/19 0500  Beta-2-glycoprotein i abs, IgG/M/A  (Hypercoagulable Panel, Comprehensive ([facility]))  Tomorrow morning,   R     09/13/19 2129   09/14/19 0500  Homocysteine, serum  (Hypercoagulable Panel, Comprehensive ([facility]))  Tomorrow morning,   R     09/13/19 2129   09/14/19 0500  Prothrombin gene mutation  (Hypercoagulable Panel, Comprehensive ([facility]))  Tomorrow morning,   R     09/13/19 2129   09/14/19 0500  Cardiolipin antibodies, IgG, IgM, IgA  (Hypercoagulable Panel, Comprehensive ([facility]))  Tomorrow morning,   R     09/13/19 2129   09/14/19 0500  ANA, IFA (with reflex)  Tomorrow  morning,   R     09/13/19 2129   09/14/19 0500  C-reactive protein  Tomorrow morning,   R     09/13/19 2129   09/14/19 0500  Sedimentation rate  Tomorrow  morning,   R     09/13/19 2129   09/14/19 0500  Vitamin B12  Tomorrow morning,   R     09/13/19 2129   09/14/19 0500  RPR  Tomorrow morning,   R     09/13/19 2129   09/13/19 1530  Urinalysis, Routine w reflex microscopic  Once,   R     09/13/19 1530   09/13/19 1503  Urine culture  ONCE - STAT,   STAT     09/13/19 1502   09/13/19 1453  Urine culture  Add-on,   AD     09/13/19 1452   09/13/19 1318  Blood gas, venous  Once,   STAT     09/13/19 1318   09/13/19 1318  Culture, blood (routine x 2)  BLOOD CULTURE X 2,   STAT     09/13/19 1318          Vitals/Pain Today's Vitals   09/13/19 2115 09/13/19 2145 09/13/19 2247 09/13/19 2300  BP: 120/74 126/70 121/77 119/72  Pulse: (!) 103 (!) 106    Resp: Temp:      TempSrc:      SpO2: 97% 95%      Isolation Precautions No active isolations  Medications Medications  sodium chloride flush (NS) 0.9 % injection 3 mL (has no administration in time range)  atorvastatin (LIPITOR) tablet 80 mg (has no administration in time range)  citalopram (CELEXA) tablet 40 mg (has no administration in time range)  enoxaparin (LOVENOX) injection 40 mg (has no administration in time range)   stroke: mapping our early stages of recovery book (has no administration in time range)  insulin aspart (novoLOG) injection 0-9 Units (has no administration in time range)  0.9 %  sodium chloride infusion ( Intravenous Transfusing/Transfer 09/14/19 0016)   stroke: mapping our early stages of recovery book (has no administration in time range)  aspirin suppository 300 mg ( Rectal See Alternative 09/13/19 2314)    Or  aspirin tablet 325 mg (325 mg Oral Given 09/13/19 2314)  hydrochlorothiazide (MICROZIDE) capsule 12.5 mg (has no administration in time range)  irbesartan (AVAPRO) tablet 150 mg (has no administration in time range)  cefTRIAXone (ROCEPHIN) 1 g in sodium chloride 0.9 % 100 mL IVPB (0 g Intravenous Stopped 09/13/19 2015)  0.9 %  sodium  chloride infusion ( Intravenous Stopped 09/13/19 2025)  iohexol (OMNIPAQUE) 350 MG/ML injection 75 mL (75 mLs Intravenous Contrast Given 09/13/19 2202)    Mobility walks with device High fall risk   Focused Assessments Neuro Assessment Handoff:  Swallow screen pass? Yes    NIH Stroke Scale ( + Modified Stroke Scale Criteria)  Interval: Initial Level of Consciousness (1a.)   : Alert, keenly responsive LOC Questions (1b. )   +: Answers one question correctly LOC Commands (1c. )   + : Performs both tasks correctly Best Gaze (2. )  +: Normal Visual (3. )  +: No visual loss Facial Palsy (4. )    : Minor paralysis(left side droop (has some at baseline)) Motor Arm, Left (5a. )   +: No drift Motor Arm, Right (5b. )   +: Amputation or joint fusion Motor Leg, Left (6a. )   +: Some effort against gravity  Motor Leg, Right (6b. )   +: No drift Limb Ataxia (7. ): Absent Sensory (8. )   +: Normal, no sensory loss Best Language (9. )   +: Mild-to-moderate aphasia Dysarthria (10. ): Normal Extinction/Inattention (11.)   +: No Abnormality Modified SS Total  +: 4 Complete NIHSS TOTAL: 5     Neuro Assessment:   Neuro Checks:   Initial (09/13/19 1421)  Last Documented NIHSS Modified Score: 4 (09/13/19 2021) Has TPA been given? No If patient is a Neuro Trauma and patient is going to OR before floor call report to 4N Charge nurse: [phone] or [phone]     R Recommendations: See Admitting Provider Note  Report given to:   Additional Notes: alert and oriented at this time.

## 2019-09-15 ENCOUNTER — Inpatient Hospital Stay (HOSPITAL_COMMUNITY): Payer: Medicare Other

## 2019-09-15 DIAGNOSIS — Z7189 Other specified counseling: Secondary | ICD-10-CM

## 2019-09-15 DIAGNOSIS — G9340 Encephalopathy, unspecified: Secondary | ICD-10-CM

## 2019-09-15 DIAGNOSIS — R410 Disorientation, unspecified: Secondary | ICD-10-CM

## 2019-09-15 DIAGNOSIS — Z515 Encounter for palliative care: Secondary | ICD-10-CM

## 2019-09-15 DIAGNOSIS — E1165 Type 2 diabetes mellitus with hyperglycemia: Secondary | ICD-10-CM

## 2019-09-15 DIAGNOSIS — Z8673 Personal history of transient ischemic attack (TIA), and cerebral infarction without residual deficits: Secondary | ICD-10-CM

## 2019-09-15 HISTORY — PX: IR ANGIO VERTEBRAL SEL VERTEBRAL UNI L MOD SED: IMG5367

## 2019-09-15 HISTORY — PX: IR ANGIO INTRA EXTRACRAN SEL COM CAROTID INNOMINATE BILAT MOD SED: IMG5360

## 2019-09-15 LAB — BASIC METABOLIC PANEL
Anion gap: 11 (ref 5–15)
BUN: 12 mg/dL (ref 6–20)
CO2: 23 mmol/L (ref 22–32)
Calcium: 8.7 mg/dL — ABNORMAL LOW (ref 8.9–10.3)
Chloride: 102 mmol/L (ref 98–111)
Creatinine, Ser: 0.46 mg/dL (ref 0.44–1.00)
GFR calc Af Amer: >60 mL/min (ref >60)
GFR calc non Af Amer: >60 mL/min (ref >60)
Glucose, Bld: 220 mg/dL — ABNORMAL HIGH (ref 70–99)
Potassium: 3.5 mmol/L (ref 3.5–5.1)
Sodium: 136 mmol/L (ref 135–145)

## 2019-09-15 LAB — CBC
HCT: 42.6 % (ref 36.0–46.0)
Hemoglobin: 14.2 g/dL (ref 12.0–15.0)
MCH: 30.3 pg (ref 26.0–34.0)
MCHC: 33.3 g/dL (ref 30.0–36.0)
MCV: 90.8 fL (ref 80.0–100.0)
Platelets: 332 10*3/uL (ref 150–400)
RBC: 4.69 MIL/uL (ref 3.87–5.11)
RDW: 12 % (ref 11.5–15.5)
WBC: 8.6 10*3/uL (ref 4.0–10.5)
nRBC: 0 % (ref 0.0–0.2)

## 2019-09-15 LAB — BETA-2-GLYCOPROTEIN I ABS, IGG/M/A
Beta-2 Glyco I IgG: <9 GPI IgG units (ref 0–20)
Beta-2-Glycoprotein I IgA: <9 GPI IgA units (ref 0–25)
Beta-2-Glycoprotein I IgM: <9 GPI IgM units (ref 0–32)

## 2019-09-15 LAB — LUPUS ANTICOAGULANT PANEL
DRVVT: 33.3 s (ref 0.0–47.0)
PTT Lupus Anticoagulant: 23.8 s (ref 0.0–51.9)

## 2019-09-15 LAB — GLUCOSE, CAPILLARY
Glucose-Capillary: 250 mg/dL — ABNORMAL HIGH (ref 70–99)
Glucose-Capillary: 206 mg/dL — ABNORMAL HIGH (ref 70–99)
Glucose-Capillary: 186 mg/dL — ABNORMAL HIGH (ref 70–99)
Glucose-Capillary: 254 mg/dL — ABNORMAL HIGH (ref 70–99)

## 2019-09-15 LAB — HOMOCYSTEINE: Homocysteine: 10.3 umol/L (ref 0.0–14.5)

## 2019-09-15 MED ORDER — METOPROLOL TARTRATE 12.5 MG HALF TABLET
12.5000 mg | ORAL_TABLET | Freq: Two times a day (BID) | ORAL | Status: DC
  Administered 2019-09-15 – 2019-09-18 (×7): 12.5 mg via ORAL
  Filled 2019-09-15 (×7): qty 1

## 2019-09-15 MED ORDER — FENTANYL CITRATE (PF) 100 MCG/2ML IJ SOLN
INTRAMUSCULAR | Status: AC
  Filled 2019-09-15: qty 2

## 2019-09-15 MED ORDER — ACETAMINOPHEN 325 MG PO TABS
650.0000 mg | ORAL_TABLET | Freq: Four times a day (QID) | ORAL | Status: DC | PRN
  Administered 2019-09-19: 650 mg via ORAL
  Filled 2019-09-15: qty 2

## 2019-09-15 MED ORDER — MIDAZOLAM HCL 2 MG/2ML IJ SOLN
INTRAMUSCULAR | Status: AC
  Filled 2019-09-15: qty 2

## 2019-09-15 MED ORDER — HEPARIN SODIUM (PORCINE) 1000 UNIT/ML IJ SOLN
INTRAMUSCULAR | Status: AC
  Filled 2019-09-15: qty 1

## 2019-09-15 MED ORDER — LIDOCAINE HCL (PF) 1 % IJ SOLN
INTRAMUSCULAR | Status: AC | PRN
  Administered 2019-09-15: 10 mL
  Administered 2019-09-15: 20 mL

## 2019-09-15 MED ORDER — INSULIN GLARGINE 100 UNIT/ML ~~LOC~~ SOLN
20.0000 [IU] | Freq: Every day | SUBCUTANEOUS | Status: DC
  Administered 2019-09-15 – 2019-09-16 (×2): 20 [IU] via SUBCUTANEOUS
  Filled 2019-09-15 (×4): qty 0.2

## 2019-09-15 MED ORDER — FENTANYL CITRATE (PF) 100 MCG/2ML IJ SOLN
INTRAMUSCULAR | Status: AC | PRN
  Administered 2019-09-15 (×2): 25 ug via INTRAVENOUS

## 2019-09-15 MED ORDER — LIDOCAINE HCL 1 % IJ SOLN
INTRAMUSCULAR | Status: AC
  Filled 2019-09-15: qty 20

## 2019-09-15 MED ORDER — HEPARIN SODIUM (PORCINE) 1000 UNIT/ML IJ SOLN
INTRAMUSCULAR | Status: AC | PRN
  Administered 2019-09-15: 1000 [IU] via INTRAVENOUS

## 2019-09-15 MED ORDER — INSULIN ASPART 100 UNIT/ML ~~LOC~~ SOLN
0.0000 [IU] | Freq: Three times a day (TID) | SUBCUTANEOUS | Status: DC
  Administered 2019-09-15: 8 [IU] via SUBCUTANEOUS
  Administered 2019-09-15: 17:00:00 3 [IU] via SUBCUTANEOUS
  Administered 2019-09-15: 5 [IU] via SUBCUTANEOUS
  Administered 2019-09-16: 3 [IU] via SUBCUTANEOUS
  Administered 2019-09-16: 5 [IU] via SUBCUTANEOUS
  Administered 2019-09-16: 3 [IU] via SUBCUTANEOUS
  Administered 2019-09-17: 8 [IU] via SUBCUTANEOUS
  Administered 2019-09-17: 3 [IU] via SUBCUTANEOUS
  Administered 2019-09-17 – 2019-09-18 (×3): 5 [IU] via SUBCUTANEOUS
  Administered 2019-09-18 – 2019-09-19 (×2): 3 [IU] via SUBCUTANEOUS
  Administered 2019-09-19 (×2): 5 [IU] via SUBCUTANEOUS

## 2019-09-15 MED ORDER — MIDAZOLAM HCL 2 MG/2ML IJ SOLN
INTRAMUSCULAR | Status: AC | PRN
  Administered 2019-09-15: 1 mg via INTRAVENOUS

## 2019-09-15 MED ORDER — IOHEXOL 300 MG/ML  SOLN
150.0000 mL | Freq: Once | INTRAMUSCULAR | Status: AC | PRN
  Administered 2019-09-15: 70 mL via INTRA_ARTERIAL

## 2019-09-15 NOTE — Progress Notes (Addendum)
Family Medicine Teaching Service Daily Progress Note Intern Pager: [phone]  Patient name: Christina Evans Medical record number: [id number] Date of birth: 05-Nov-1961 Age: 58 y.o. Gender: female  Primary Care Provider: Patient, No Pcp Per Consultants: Neurology Code Status: Full Code   Pt Overview and Major Events to Date:  10/21- admitted with actue stroke in left ACA, started on ASA 325, no tPa, neuro consulted   Assessment and Plan: Aluel Schwarz is a 58 y.o. female presenting with altered mental status. PMH is significant for history of stroke, insulin-dependent diabetes, hyperlipidemia, hypertension.  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 Planning on diagnostic angio with IR this am, continues on ASA and plavix. Lipid panel with elevated LDL. RPR negative. Hypercoagulable labs pending. Infectious w/u negative. ECHO unable to visualize interatrial septum. LE dopplers negative for DVT. Intermittently awake during exam this am but somewhat cooperative. L sided facial droop with residual L sided deficits.  -Continue aspirin 325 -Continue atorvastatin 80 mg -Neurology following, will appreciate further recommendations -PT/OT - rec SNF but family wants to take home -consult to palliative care for GOC discussion   Type 2 diabetes, insulin-dependent: Patient on lantus 50 units twice daily and 15 units with meals at home.  Patient is also noted to be on 1000 mg twice daily Metformin.  Started on 10u Lantus yesterday with 18u aspart in last 24 hours. Blood glucose ranges overnight mid 200s. A1c 9.8 this admission. - change ssi to moderate. -Continue to monitor CBGs  -increase Lantus to 20u  Hypertension Slightly hypertensive overnight with SBP 140-160s with normal goal. Patient home medications include Lopressor 12.5 mg twice daily and valsartan-hydrochlorothiazide 160-12.5 mg daily.    -Continue to monitor  blood pressure -Continue home HCTZ 12.5 mg and irbesartan 150 while admitted  Left foot fifth metatarsal fracture with osteopenia Patient found to have a fifth metatarsal fracture on left foot x-ray. Ortho consulted and recommended outpatient f/u.  History of asthma Home medication include albuterol 2 puffs 4 times daily as needed -Continue albuterol as needed  FEN/GI: Heart healthy carb modified diet PPx: Lovenox   Disposition: Discharge pending completion of neurological work-up and improvement of mental status  Subjective:  Patient intermittently awake on exam this morning with no complaints.  Objective: Temp:  [97.8 F (36.6 C)-98.7 F (37.1 C)] 98.3 F (36.8 C) (10/23 0431) Pulse Rate:  [89-99] 91 (10/23 0431) Resp:  [16-18] 16 (10/22 1559) BP: (105-163)/(59-91) 152/76 (10/23 0431) SpO2:  [94 %-100 %] 99 % (10/23 0431) Weight:  [75.6 kg] 75.6 kg (10/22 1408)  Physical Exam: General: intermittently awake, arouses easily. In NAD Cardiovascular:  RRR Respiratory: normal WOB on RA, CTAB Abdomen: soft, NTND Extremities: warm and well perfused MSK:  strength 4/5 to R U/LE, 3/5 L U/LE.  No edema.  Neuro: intermittently alert, speech normal.  Extraocular movements intact.  Intact symmetric sensation to light touch of face and extremities bilaterally.  Hearing grossly intact bilaterally.  Tongue protrudes normally with no deviation.  L sided facial droop.  Laboratory: Recent Labs  Lab 09/13/19 1257 09/13/19 1339 09/14/19 0227  WBC 9.5  --  10.3  HGB 14.5 15.0 13.1  HCT 44.8 44.0 39.7  PLT 370  --  331   Recent Labs  Lab 09/13/19 1257 09/13/19 1339 09/14/19 0227  NA 142 141 138  K 3.8 3.7 3.6  CL 104  --  104  CO2 26  --  24  BUN 31*  --  29*  CREATININE 0.55  --  0.60  CALCIUM 9.5  --  8.7*  PROT 8.0  --   --   BILITOT 0.7  --   --   ALKPHOS 78  --   --   ALT 27  --   --   AST 19  --   --   GLUCOSE 255*  --  296*     Imaging/Diagnostic Tests: Vas [place]  Lower Extremity Venous (dvt)  Result Date: 09/14/2019  Lower Venous Study Indications: Edema.  Performing Technologist: Antonieta Pert RDMS, RVT  Examination Guidelines: A complete evaluation includes B-mode imaging, spectral Doppler, color Doppler, and power Doppler as needed of all accessible portions of each vessel. Bilateral testing is considered an integral part of a complete examination. Limited examinations for reoccurring indications may be performed as noted.  +---------+---------------+---------+-----------+----------+--------------+ RIGHT    CompressibilityPhasicitySpontaneityPropertiesThrombus Aging +---------+---------------+---------+-----------+----------+--------------+ CFV      Full           Yes      Yes                                 +---------+---------------+---------+-----------+----------+--------------+ SFJ      Full                                                        +---------+---------------+---------+-----------+----------+--------------+ FV Prox  Full                                                        +---------+---------------+---------+-----------+----------+--------------+ FV Mid   Full                                                        +---------+---------------+---------+-----------+----------+--------------+ FV DistalFull                                                        +---------+---------------+---------+-----------+----------+--------------+ PFV      Full                                                        +---------+---------------+---------+-----------+----------+--------------+ POP      Full           Yes      Yes                                 +---------+---------------+---------+-----------+----------+--------------+ PTV      Full                                                        +---------+---------------+---------+-----------+----------+--------------+  PERO     Full                                                         +---------+---------------+---------+-----------+----------+--------------+ GSV      Full                                                        +---------+---------------+---------+-----------+----------+--------------+   +---------+---------------+---------+-----------+----------+--------------+ LEFT     CompressibilityPhasicitySpontaneityPropertiesThrombus Aging +---------+---------------+---------+-----------+----------+--------------+ CFV      Full           Yes      Yes                                 +---------+---------------+---------+-----------+----------+--------------+ SFJ      Full                                                        +---------+---------------+---------+-----------+----------+--------------+ FV Prox  Full                                                        +---------+---------------+---------+-----------+----------+--------------+ FV Mid   Full                                                        +---------+---------------+---------+-----------+----------+--------------+ FV DistalFull                                                        +---------+---------------+---------+-----------+----------+--------------+ PFV      Full                                                        +---------+---------------+---------+-----------+----------+--------------+ POP      Full           Yes      Yes                                 +---------+---------------+---------+-----------+----------+--------------+ PTV      Full                                                        +---------+---------------+---------+-----------+----------+--------------+  PERO     Full                                                        +---------+---------------+---------+-----------+----------+--------------+ GSV      Full                                                         +---------+---------------+---------+-----------+----------+--------------+     Summary: Right: There is no evidence of deep vein thrombosis in the lower extremity. No cystic structure found in the popliteal fossa. Left: There is no evidence of deep vein thrombosis in the lower extremity. No cystic structure found in the popliteal fossa.  *See table(s) above for measurements and observations. Electronically signed by Lemar LivingsBrandon Cain MD on 09/14/2019 at 2:15:44 PM.    Final    Ellwood Denseumball, Alison, DO 09/15/2019, 6:54 AM PGY-3, [facility] FPTS Intern pager: [phone], text pages welcome

## 2019-09-15 NOTE — Consult Note (Signed)
Consultation Note Date: 09/15/2019   Patient Name: Christina Evans  DOB: 10-Sep-1961  MRN: [id number]  Age / Sex: 58 y.o., female  PCP: Patient, No Pcp Per Referring Physician: Zenia Resides, MD  Reason for Consultation: Establishing goals of care  HPI/Patient Profile: 58 y.o. female  with past medical history of CVA, insulin-dependent type 2 DM, HTN, HLD, asthma admitted on 09/13/2019 with altered mental status, found down by daughter. Patient found to have acute left ACA stroke and old right MCA stroke. EEG with mild diffuse encephalopathy without seizure activity. 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 Secondary severely attenuated flow throughout both MCA distributions, left worse than right. IR consulted for image-guided diagnostic cerebral arteriogram to evaluate intracranial stenosis. PT recommending SNF for rehab. Palliative medicine consultation for goals of care.   Clinical Assessment and Goals of Care:  I have reviewed medical records, discussed with care team, and assessed the patient at bedside. She is sitting in recliner. Assisted her back in bed for IR procedure. Marthella is very weak and requires max, 2 person assist. She is confused. Reoriented her on time/place/situation. Transport team takes patient down to IR.  Discussed GOC with daughter, Dominga Ferry, at bedside.   Introduced Palliative Medicine as specialized medical care for people living with serious illness. It focuses on providing relief from the symptoms and stress of a serious illness. The goal is to improve quality of life for both the patient and the family.  We discussed a brief life review of the patient. Gila suffered a stroke in 2008, with left-sided residual weakness. [name]  believes she has been non-compliant with medications. Prior to admission, patient was living with her boyfriend and friends.   Discussed events leading up to admission and course of hospitalization including diagnoses, interventions, and plan of care. Reviewed IR note and procedure planned for today.   I attempted to elicit values and goals of care important to the daughter. [name] does NOT want her mother going to a nursing home. [name] wishes to take her mother home with her and available to provide 24/7 care. [name] is hopeful to get her mother "back on track" with medication compliance, diet compliance, and aggressive PT/OT/SLP efforts. Daughter is requesting DME including bedside commode, wheelchair, and hospital bed. Daughter is eager to do anything possible to care for her mother and help her recover from this stroke.   Advanced directives, concepts specific to code status, artifical feeding and hydration were discussed. The patient does not have a documented living will. She has unfortunately been estranged from many of her children, besides [name] and her sister Nunnelley). At this point, [name] speaks of doing "anything possible" to keep her mother alive. After further discussion, she shares "knowing my mom, she wouldn't want to live in the bed or as a vegetable." Encouraged ongoing discussion with her mother regarding EOL wishes (if her mother becomes more cognizant and able to discuss her wishes).  At the end of conversation, Dominga Ferry shares that prior to admission, her mother was suicidal and has been very depressed following her husband leaving and estrangement from many of her children and grandchildren. [name] reports her mother has been speaking to a therapist outpatient. Discussed this with RN to evaluate if patient is suicidal when she returns from procedure. [name] also plans to continue to monitor for this.   All questions and concerns were addressed. Discussed pending SLP  evaluation and ongoing PT/OT efforts.     SUMMARY OF RECOMMENDATIONS    FULL code/FULL scope treatment  Continue PT/OT/SLP attempts  Pending IR evaluation. Cerebral arteriogram today.   Daughter does not want patient to discharge to SNF. She is adamant to take her home and care for her. Daughter is able to provide 24/7 assist. Care management consult for home health. Patient needs DME.  May benefit from outpatient palliative referral.   Code Status/Advance Care Planning:  Full code  Symptom Management:   Per attending  Palliative Prophylaxis:   Aspiration, Delirium Protocol and Oral Care  Psycho-social/Spiritual:   Desire for further Chaplaincy support: yes  Additional Recommendations: Caregiving  Support/Resources  Prognosis:   Unable to determine  Discharge Planning: To Be Determined      Primary Diagnoses: Present on Admission: . AMS (altered mental status) . Acute CVA (cerebrovascular accident) ([place]) . CVA (cerebral vascular accident) ([place])   I have reviewed the medical record, interviewed the patient and family, and examined the patient. The following aspects are pertinent.  Past Medical History:  Diagnosis Date  . Asthma   . CVA (cerebral vascular accident) ([place])   . Diabetes mellitus without complication [facility])    Social History   Socioeconomic History  . Marital status: Single    Spouse name: Not on file  . Number of children: Not on file  . Years of education: Not on file  . Highest education level: Not on file  Occupational History  . Not on file  Social Needs  . Financial resource strain: Not on file  . Food insecurity    Worry: Not on file    Inability: Not on file  . Transportation needs    Medical: Not on file    Non-medical: Not on file  Tobacco Use  . Smoking status: Former Research scientist (life sciences)  . Smokeless tobacco: Never Used  . Tobacco comment: QUIT IN 2008  Substance and Sexual Activity  . Alcohol use: Yes    Comment: RARE  . Drug  use: No  . Sexual activity: Not on file  Lifestyle  . Physical activity    Days per week: Not on file    Minutes per session: Not on file  . Stress: Not on file  Relationships  . Social Herbalist on phone: Not on file    Gets together: Not on file    Attends religious service: Not on file    Active member of club or organization: Not on file    Attends meetings of clubs or organizations: Not on file    Relationship status: Not on file  Other Topics Concern  . Not on file  Social History Narrative  . Not on file   History reviewed. No pertinent family history. Scheduled Meds: . aspirin EC  325 mg Oral Daily  . atorvastatin  80 mg Oral q1800  . citalopram  40 mg Oral Daily  . clopidogrel  75 mg Oral Daily  . [START ON 09/16/2019] enoxaparin (LOVENOX) injection  40 mg Subcutaneous Daily  . insulin aspart  0-15 Units Subcutaneous TID WC  . insulin glargine  20 Units Subcutaneous Daily  . metoprolol tartrate  12.5 mg Oral BID  . sodium chloride flush  3 mL Intravenous Once   Continuous Infusions: . sodium chloride 50 mL/hr at 09/14/19 1737   PRN Meds:. Medications Prior to Admission:  Prior to Admission medications   Medication Sig Start Date End Date Taking? Authorizing Provider  acetaminophen (TYLENOL) 325 MG tablet Take 325-650 mg by mouth every 6 (six) hours as needed (for headaches or pain).    [provider]  albuterol (PROAIR HFA) 108 (90 Base) MCG/ACT inhaler Inhale 2 puffs into the lungs 4 (four) times daily as needed for wheezing or shortness of breath.     [provider]  atorvastatin (LIPITOR) 80 MG tablet Take 1 tablet (80 mg total) by mouth daily at 6 PM. 09/03/17   Cheryln Manly, NP  blood glucose meter kit and supplies Dispense based on patient and insurance preference. Use up to four times daily as directed. (FOR ICD-9 250.00, 250.01). 09/02/17   Cheryln Manly, NP  citalopram (CELEXA) 40 MG tablet Take 40 mg by mouth  daily.    [provider]  insulin aspart (NOVOLOG FLEXPEN) 100 UNIT/ML FlexPen Inject 15 Units into the skin 3 (three) times daily with meals.    [provider]  Insulin Glargine (LANTUS SOLOSTAR) 100 UNIT/ML Solostar Pen Inject 50 Units into the skin 2 (two) times daily.    [provider]  insulin lispro (HUMALOG) 100 UNIT/ML KwikPen  08/04/19   [provider]  metFORMIN (GLUCOPHAGE) 500 MG tablet Take 1 tablet (500 mg total) by mouth 2 (two) times daily with a meal. Patient not taking: Reported on 09/14/2019 09/02/17   Reino Bellis B, NP  metFORMIN (GLUCOPHAGE-XR) 500 MG 24 hr tablet Take 1,000 mg by mouth 2 (two) times daily. 03/29/19   [provider]  metoprolol tartrate (LOPRESSOR) 25 MG tablet Take 0.5 tablets (12.5 mg total) by mouth 2 (two) times daily. 09/02/17   Cheryln Manly, NP  omega-3 acid ethyl esters (LOVAZA) 1 g capsule Take 1 capsule (1 g total) by mouth 2 (two) times daily. 09/02/17   Cheryln Manly, NP  valsartan-hydrochlorothiazide (DIOVAN-HCT) 160-12.5 MG tablet Take 1 tablet by mouth daily. 09/02/17   Cheryln Manly, NP  venlafaxine XR (EFFEXOR-XR) 37.5 MG 24 hr capsule Take 37.5 mg by mouth daily. 03/29/19   [provider]   Allergies  Allergen Reactions  . Tape Itching and Other (See Comments)    Causes a lot of scratching, also   Review of Systems  Unable to perform ROS: Acuity of condition   Physical Exam Vitals signs and nursing note reviewed.  Constitutional:      General: She is awake.     Appearance: She is ill-appearing.  Pulmonary:     Effort: No tachypnea, accessory muscle usage or respiratory distress.  Skin:    General: Skin is warm and dry.  Neurological:     Mental Status: She is alert.     Comments: Awake, oriented to person. Reoriented to place/time/situation. Pleasant confusion. Left-sided weakness.  Psychiatric:        Attention and Perception: She is inattentive.         Speech: Speech is delayed.        Cognition and Memory: Cognition is impaired.    Vital Signs: BP (!) 142/77  Pulse 87   Temp 98.2 F (36.8 C) (Oral)   Resp 14   Ht 5' (1.524 m)   Wt 75.6 kg   SpO2 99%   BMI 32.55 kg/m  Pain Scale: 0-10   Pain Score: 0-No pain   SpO2: SpO2: 99 % O2 Device:SpO2: 99 % O2 Flow Rate: .   IO: Intake/output summary:   Intake/Output Summary (Last 24 hours) at 09/15/2019 1121 Last data filed at 09/15/2019 0241 Gross per 24 hour  Intake 1172.96 ml  Output 1500 ml  Net -327.04 ml    LBM: Last BM Date: (pta) Baseline Weight: Weight: 75.6 kg Most recent weight: Weight: 75.6 kg     Palliative Assessment/Data: PPS 40%     Time In: 1345 Time Out: 1445 Time Total: 70 Greater than 50%  of this time was spent counseling and coordinating care related to the above assessment and plan.  Signed by:  Ihor Dow, DNP, FNP-C Palliative Medicine Team  Phone: [phone] Fax: [phone]   Please contact Palliative Medicine Team phone at [phone] for questions and concerns.  For individual provider: See Shea Evans

## 2019-09-15 NOTE — Discharge Summary (Addendum)
Christina Evans  Patient name: Christina Evans Medical record number: [id number] Date of birth: [birth year] Age: 58 y.o. Gender: female Date of Admission: 09/13/2019  Date of Discharge: 09/19/19 Admitting Physician: Zenia Resides, MD  Primary Care Provider: Patient, No Pcp Per Consultants: Neurology   Indication for Hospitalization: AMS, found to have subacute CVA   Discharge Diagnoses/Problem List:  Active Problems:   Controlled diabetes mellitus type 2 with complications ([place])   AMS (altered mental status)   Acute CVA (cerebrovascular accident) ([place])   CVA (cerebral vascular accident) ([place])   Severe comorbid illness   Palliative care by specialist   Goals of care, counseling/discussion   Encephalopathy  Disposition: discharge to home with Home health   Discharge Condition: stable  Discharge Exam:   General: female in NAD lying in bed quietly Cardiovascular: RRR without murmurs, gallops or friction rubs  Respiratory: CTAB without wheezing or crackles   Abdomen: soft, NT, bowel sounds present throughout  Extremities: tenderness to palpation of left foot with no erythema or edema present   Brief Hospital Course:   Christina Evans is a 58 y.o. female who presented with AMS and found to have subacute CVA in her left ACA. PMH significant for large right MCA CVA, HTN, T2DM, HLD, and asthma.   Subacute CVA involving Left ACA  Christina Evans was brought to the ED by her daughter after being found in a state of confusion in her home that is 2 hours from [place]. Upon presentation in the ED, Neurology was consulted recommended imaging including Head CT and Brain MRI. Patient underwent head CT that showed no acute intracranial hemorrhage, old large right MCA infarct and subacute infarct in the left frontal lobe. This was  followed by MRI that confirmed sub-actue moderate sized CVA involving left ACA with no mass effect. Neurology was  consulted Patient was evaluated by SLP  and physical therapy who recommended that patient be discharged to SNF with 24/7 supervision given patient's left sided weakness and confusion. Given patient's prognosis in the setting of mulitple strokes, palliative care medicine was consulted in order to discuss goals of care with the patient and her family. It was ultimately decided that the patient should remain full code and the family would proceed with full measures of treatment. Christina Evans will be discharged to SNF.   T2DM, insulin dependent  Patient was admitted with records of home insulin dosing at 50 units twice daily along with Metformin 1000 mg twice daily. Unsure if patient is adhereing to this regimen. Hemoglobin A1c was 9.8. During this admission, patient was on 33 units of lantus daily in addition to a sensitive sliding scale. Blood glucoses ranges were in the 180s-200s throughout hospital stay.   Hypertension Home metoprolol and combo ARB-HCTZ was discontinued due to hypotension. Neuro recommends maintaining blood pressure between 130 and 150 due to her secondary moyamoya.  Transitioned to irbesartan.  Blood pressures remained within normal limits for majority of admission.  Left foot fifth metatarsal fracture with osteopenia Patient found to have a fifth metatarsal fracture on left foot x-ray. Orthopedics was consulted and recommended outpatient f/u with Dr. Stann Mainland, 50% WB as tolerated. Patient did not report pain but would withdraw 2/2 to pain if this foot was touched.    History of asthma Home medications of albuterol was continued as dosed at 2 puffs up to 4 times daily. Patient did not require albuterol during this admission.    Issues for Follow Up:  1. Follow up in Stroke Clinic in 4 weeks with Dr. Leonie [facility] at [facility]. Follow up appropriate duration of full dose aspirin. 2. Follow up with Dr. Stann Mainland for left foot metatarsal fracture.  3. Follow up on patient's  insulin regimen, blood glucoses ranges were in the 200s throughout hospitalization.   New Medications   1. ASA 373m daily  2. Plavix 774mdaily  3. irbesartan  Significant Procedures: none   Significant Labs and Imaging:  Recent Labs  Lab 09/13/19 1257 09/13/19 1339 09/14/19 0227 09/15/19 0643  WBC 9.5  --  10.3 8.6  HGB 14.5 15.0 13.1 14.2  HCT 44.8 44.0 39.7 42.6  PLT 370  --  331 332   Recent Labs  Lab 09/13/19 1257 09/13/19 1317  09/15/19 0643 09/16/19 0227 09/17/19 0255 09/18/19 0326 09/19/19 0329  NA 142  --    < > 136 136 138 137 137  K 3.8  --    < > 3.5 3.7 3.6 3.4* 3.7  CL 104  --    < > 102 101 105 104 106  CO2 26  --    < > 23 24 25 24 24   GLUCOSE 255*  --    < > 220* 192* 245* 157* 187*  BUN 31*  --    < > 12 14 14 11 9   CREATININE 0.55  --    < > 0.46 0.48 0.56 0.58 0.58  CALCIUM 9.5  --    < > 8.7* 8.7* 8.6* 8.3* 8.6*  MG  --  2.2  --   --   --   --   --   --   PHOS  --  4.0  --   --   --   --   --   --   ALKPHOS 78  --   --   --   --   --   --   --   AST 19  --   --   --   --   --   --   --   ALT 27  --   --   --   --   --   --   --   ALBUMIN 3.9  --   --   --   --   --   --   --    < > = values in this interval not displayed.    Results/Tests Pending at Time of Discharge: ANA, IFA, Prothrombin Gene mutation   Discharge Medications:  Allergies as of 09/19/2019      Reactions   Tape Itching, Other (See Comments)   Causes a lot of scratching, also      Medication List    STOP taking these medications   insulin lispro 100 UNIT/ML KwikPen Commonly known as: HUMALOG   metoprolol tartrate 25 MG tablet Commonly known as: LOPRESSOR   valsartan-hydrochlorothiazide 160-12.5 MG tablet Commonly known as: DIOVAN-HCT   venlafaxine XR 37.5 MG 24 hr capsule Commonly known as: EFFEXOR-XR     TAKE these medications   acetaminophen 325 MG tablet Commonly known as: TYLENOL Take 325-650 mg by mouth every 6 (six) hours as needed (for headaches or  pain).   aspirin 325 MG EC tablet Take 1 tablet (325 mg total) by mouth daily.   atorvastatin 80 MG tablet Commonly known as: LIPITOR Take 1 tablet (80 mg total) by mouth daily at 6 PM.   blood glucose meter kit and supplies  Dispense based on patient and insurance preference. Use up to four times daily as directed. (FOR ICD-9 250.00, 250.01).   citalopram 40 MG tablet Commonly known as: CELEXA Take 40 mg by mouth daily.   clopidogrel 75 MG tablet Commonly known as: PLAVIX Take 1 tablet (75 mg total) by mouth daily.   irbesartan 150 MG tablet Commonly known as: AVAPRO Take 1 tablet (150 mg total) by mouth daily. Start taking on: September 20, 2019   Lantus SoloStar 100 UNIT/ML Solostar Pen Generic drug: Insulin Glargine Inject 33 Units into the skin daily. What changed:   how much to take  when to take this   metFORMIN 500 MG 24 hr tablet Commonly known as: GLUCOPHAGE-XR Take 1,000 mg by mouth 2 (two) times daily. What changed: Another medication with the same name was removed. Continue taking this medication, and follow the directions you see here.   NovoLOG FlexPen 100 UNIT/ML FlexPen Generic drug: insulin aspart Inject 15 Units into the skin 3 (three) times daily with meals.   omega-3 acid ethyl esters 1 g capsule Commonly known as: LOVAZA Take 1 capsule (1 g total) by mouth 2 (two) times daily.   ProAir HFA 108 (90 Base) MCG/ACT inhaler Generic drug: albuterol Inhale 2 puffs into the lungs 4 (four) times daily as needed for wheezing or shortness of breath.            Durable Medical Equipment  (From admission, onward)         Start     Ordered   09/16/19 1330  For home use only DME Walker rolling  Once    Question:  Patient needs a walker to treat with the following condition  Answer:  Weakness   09/16/19 1330   09/16/19 1251  For home use only DME Hospital bed  Once    Question Answer Comment  Length of Need Lifetime   Patient has (list medical  condition): multiple strokes involing right and left hemispheres, left sided weakness with contractures in, left foot metatarsal fracture, insulin dependent diabetes, HTN, HLD   The above medical condition requires: Patient requires the ability to reposition frequently   Head must be elevated greater than: 45 degrees   Bed type Semi-electric      09/16/19 1254   09/16/19 1217  For home use only DME Shower stool  Once     09/16/19 1216   09/16/19 1128  For home use only DME Bedside commode  Once    Question Answer Comment  Patient needs a bedside commode to treat with the following condition Limited mobility   Patient needs a bedside commode to treat with the following condition Stroke (cerebrum) ([place])      09/16/19 1128   09/16/19 1128  For home use only DME lightweight manual wheelchair with seat cushion  Once    Comments: Patient suffers from stroke which impairs their ability to perform daily activities like bathing, dressing, grooming and toileting in the home.  A cane, crutch or walker will not resolve  issue with performing activities of daily living. A wheelchair will allow patient to safely perform daily activities. Patient is not able to propel themselves in the home using a standard weight wheelchair due to endurance and general weakness. Patient can self propel in the lightweight wheelchair. Length of need Lifetime. Accessories: elevating leg rests (ELRs), wheel locks, extensions and anti-tippers.   09/16/19 1128          Discharge Instructions: Please refer to Patient  Instructions section of EMR for full details.  Patient was counseled important signs and symptoms that should prompt return to medical care, changes in medications, dietary instructions, activity restrictions, and follow up appointments.   Follow-Up Appointments: Follow-up Information    Nicholes Stairs, MD. Schedule an appointment as soon as possible for a visit in 2 week(s).   Specialty: Orthopedic  Surgery Contact information: [address] [place] [address] [place]. Go on 10/05/2019.   Why: Hospital Follow Up appointment made with Juluis Mire at 3:30pm. Contact information: [place] [address] [phone]       Garvin Fila, MD. Schedule an appointment as soon as possible for a visit in 4 week(s).   Specialties: Neurology, Radiology Contact information: [address] [place] [address] [place], [facility] Follow up.   Specialty: [place] Why: They will be in contact with you in the next 1-2 days for your first home appointment Contact information: [place] [address] [phone]           Matilde Haymaker, MD 09/19/2019, 1:38 PM PGY-2, [place]

## 2019-09-15 NOTE — Progress Notes (Signed)
STROKE TEAM PROGRESS NOTE   INTERVAL HISTORY Pt daughter at the bedside.  Patient is using back pain for urination.  Neuro stable, no acute event overnight.  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  Picture consistent with secondary moyamoya disease.  Vitals:   09/14/19 1559 09/14/19 2150 09/15/19 0051 09/15/19 0431  BP: (!) 105/91 (!) 163/77 (!) 143/59 (!) 152/76  Pulse: 94 99 92 91  Resp: 16     Temp: 98 F (36.7 C) 98.7 F (37.1 C) 98.7 F (37.1 C) 98.3 F (36.8 C)  TempSrc: Oral Oral Oral Oral  SpO2: 100% 99% 100% 99%  Weight:      Height:        CBC:  Recent Labs  Lab 09/14/19 0227 09/15/19 0643  WBC 10.3 8.6  HGB 13.1 14.2  HCT 39.7 42.6  MCV 91.9 90.8  PLT 331 332    Basic Metabolic Panel:  Recent Labs  Lab 09/13/19 1317  09/14/19 0227 09/15/19 0643  NA  --    < > 138 136  K  --    < > 3.6 3.5  CL  --   --  104 102  CO2  --   --  24 23  GLUCOSE  --   --  296* 220*  BUN  --   --  29* 12  CREATININE  --   --  0.60 0.46  CALCIUM  --   --  8.7* 8.7*  MG 2.2  --   --   --   PHOS 4.0  --   --   --    < > = values in this interval not displayed.   Lipid Panel:     Component Value Date/Time   CHOL 210 (H) 09/14/2019 0227   TRIG 152 (H) 09/14/2019 0227   HDL 35 (L) 09/14/2019 0227   CHOLHDL 6.0 09/14/2019 0227   VLDL 30 09/14/2019 0227   LDLCALC 145 (H) 09/14/2019 0227   HgbA1c:  Lab Results  Component Value Date   HGBA1C 9.8 (H) 09/13/2019   Urine Drug Screen:     Component Value Date/Time   LABOPIA NONE DETECTED 09/13/2019 1845   COCAINSCRNUR NONE DETECTED 09/13/2019 1845   LABBENZ NONE DETECTED 09/13/2019 1845   AMPHETMU NONE DETECTED 09/13/2019 1845   THCU NONE DETECTED 09/13/2019 1845   LABBARB NONE DETECTED 09/13/2019 1845    Alcohol Level     Component Value Date/Time   ETH <10 09/13/2019  1317    IMAGING Ct Angio Head W Or Wo Contrast  Result Date: 09/13/2019 CLINICAL DATA:  Initial evaluation for acute altered mental status. Known acute to subacute left frontal stroke. EXAM: CT ANGIOGRAPHY HEAD AND NECK TECHNIQUE: Multidetector CT imaging of the head and neck was performed using the standard protocol during bolus administration of intravenous contrast. Multiplanar CT image reconstructions and MIPs were obtained to evaluate the vascular anatomy. Carotid stenosis measurements (when applicable) are obtained utilizing NASCET criteria, using the distal internal carotid diameter as the denominator. CONTRAST:  64mL OMNIPAQUE IOHEXOL 350 MG/ML SOLN COMPARISON:  Prior head CT and MRI from earlier the same day. FINDINGS: CTA NECK FINDINGS Aortic arch: Visualized aortic arch normal in caliber without aneurysm or other acute finding. Bovine arch with common origin of the right brachiocephalic and left common carotid artery noted. No hemodynamically significant stenosis seen about the origin of the  great vessels. Visualized subclavian arteries patent without stenosis. Right carotid system: Right CCA patent from its origin to the bifurcation without stenosis. Eccentric soft plaque at the origin of the right ICA with mild 35% stenosis by NASCET criteria. Right ICA diminutive but otherwise patent to the skull base without additional stenosis or occlusion. Left carotid system: Left CCA patent from its origin to the bifurcation without stenosis. No significant atheromatous narrowing about the left bifurcation. Left ICA also diffusely diminutive but patent to the skull base without additional stenosis or occlusion. Vertebral arteries: Both vertebral arteries arise from the subclavian arteries. Left vertebral artery dominant and widely patent within the neck without stenosis, dissection, or occlusion. Right vertebral artery diffusely hypoplastic and is occluded at its origin, and remains largely occluded within  the neck. Minimal scant distal reconstitution within the right V2 segment, which subsequently re occludes at the V3 segment. Right vertebral is occluded as it courses into the cranial vault. Skeleton: No acute osseous abnormality. No discrete lytic or blastic osseous lesions. Moderate cervical spondylosis at C5-6. Other neck: No other acute soft tissue abnormality within the neck. Salivary glands within normal limits. Thyroid normal. Enlarged left level IB node measures 12 mm in short axis (series 5, image 100). Additional mildly enlarged 11 mm right submental node (series 5, image 108). Findings are indeterminate, but could be reactive, although no acute inflammatory changes are seen within the neck. Upper chest: Visualized upper chest demonstrates no acute finding. Partially visualized lungs are grossly clear. Review of the MIP images confirms the above findings CTA HEAD FINDINGS Anterior circulation: ICAs markedly diminutive bilaterally, but patent as they course into the cranial vault. Petrous segments widely patent. Scattered atheromatous plaque within the cavernous/supraclinoid ICAs bilaterally. Associated moderate stenosis at the anterior genu of the cavernous left ICA. There is a severe near occlusive stenosis at the para clinoid right ICA (series 7, image 28). There is severe near occlusive stenosis at the right ICA terminus (series 7, image 22). Right A1 segment patent. Left ICA terminus is perfused. Occlusion of the left A1 segment at its origin, with some retrograde filling across the circle-of-Willis. Grossly normal anterior communicating artery complex. There is occlusion of the distal right A2 segment (series 7, image 98). Left ACA demonstrates multifocal severe stenoses and demonstrates markedly attenuated flow, although does remain patent to its distal aspect. Severe fairly long segment stenosis seen involving the left M1 segment (series 7, image 25). Left MCA remains severely stenotic nearly to the  level of the bifurcation, with only faintly visible flow. Markedly attenuated flow is seen distally within the left MCA branches, which also demonstrate small vessel atheromatous irregularity. On the right, the severe stenosis involving the right ICA terminus extends into the proximal-mid right M1 segment which demonstrates markedly diminutive flow. Distal right M1 segment patent. Negative right MCA bifurcation. Distal right MCA branches are perfused, although demonstrates diffuse small vessel atheromatous irregularity. Posterior circulation: Dominant left vertebral artery demonstrates scattered atheromatous irregularity but is patent to the vertebrobasilar junction without stenosis. Right vertebral occluded as it courses into the cranial vault. Retrograde filling of the distal right V4 segment two perfuse the right PICA. Left PICA not seen. Basilar patent to its distal aspect without stenosis. Superior cerebral arteries patent bilaterally. Both of the posterior cerebral arteries primarily supplied via the basilar. Left PCA perfused to its distal aspect without stenosis. Short-segment severe distal right P2 stenosis noted (series 10, image 25). Venous sinuses: Grossly patent, although not well assessed due to  timing of the contrast bolus. Anatomic variants: None significant. Review of the MIP images confirms the above findings IMPRESSION: 1. 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 Secondary severely attenuated flow throughout both MCA distributions, left worse than right. 2. Occluded right A2 segment. Left ACA severely diseased and attenuated but remains patent. 3. Occluded left A1 segment. 4. Hypoplastic and occluded right vertebral artery. Dominant left vertebral artery patent without significant stenosis. Retrograde filling of the distal right V4 segment with perfusion of the right PICA. 5.  Severe distal right P2 stenosis. 6. Otherwise relative patency of the major arterial vasculature of the neck, although the ICAs are diffusely diminutive and hypoplastic bilaterally. 7. Mildly enlarged level I and left level II lymph nodes as above, indeterminate, but could be reactive. Electronically Signed   By: Rise Mu M.D.   On: 09/13/2019 23:24   Dg Chest 2 View  Result Date: 09/13/2019 CLINICAL DATA:  Initial evaluation for acute altered mental status, history of asthma, former smoker. EXAM: CHEST - 2 VIEW COMPARISON:  Prior radiograph from 09/01/2017. FINDINGS: Mild cardiomegaly, stable. Mediastinal silhouette within normal limits. Lungs hypoinflated. Mild diffuse pulmonary vascular congestion with interstitial prominence, suggesting pulmonary interstitial congestion. No frank pulmonary edema. No pleural effusion. No focal infiltrates. No pneumothorax. No acute osseous finding. IMPRESSION: 1. Cardiomegaly with mild diffuse pulmonary interstitial congestion without frank pulmonary edema. 2. No other active cardiopulmonary disease. Electronically Signed   By: Rise Mu M.D.   On: 09/13/2019 20:23   Ct Head Wo Contrast  Result Date: 09/13/2019 CLINICAL DATA:  58 year old female with altered mental status. EXAM: CT HEAD WITHOUT CONTRAST TECHNIQUE: Contiguous axial images were obtained from the base of the skull through the vertex without intravenous contrast. COMPARISON:  None. FINDINGS: Brain: There is mild age-related atrophy. Large area of old infarct and encephalomalacia noted in the right MCA territory. There is associated mild ex vacuo dilatation of the right lateral ventricle. A focal area of low attenuation in the left frontal lobe abutting the frontal horn of the left lateral ventricle most likely represents an area of subacute or old infarct. Clinical correlation is recommended. MRI may provide better characterization if clinically indicated. There is no acute  intracranial hemorrhage. No mass effect or midline shift. No extra-axial fluid collection. Vascular: No hyperdense vessel or unexpected calcification. Skull: Normal. Negative for fracture or focal lesion. Sinuses/Orbits: No acute finding. Other: None IMPRESSION: 1. No acute intracranial hemorrhage. 2. Large right MCA territory old infarct and encephalomalacia. 3. Probable focal area of subacute or old infarct in the left frontal lobe. Clinical correlation is recommended. Electronically Signed   By: Elgie Collard M.D.   On: 09/13/2019 14:48   Ct Angio Neck W Or Wo Contrast  Result Date: 09/13/2019 CLINICAL DATA:  Initial evaluation for acute altered mental status. Known acute to subacute left frontal stroke. EXAM: CT ANGIOGRAPHY HEAD AND NECK TECHNIQUE: Multidetector CT imaging of the head and neck was performed using the standard protocol during bolus administration of intravenous contrast. Multiplanar CT image reconstructions and MIPs were obtained to evaluate the vascular anatomy. Carotid stenosis measurements (when applicable) are obtained utilizing NASCET criteria, using the distal internal carotid diameter as the denominator. CONTRAST:  75mL OMNIPAQUE IOHEXOL 350 MG/ML SOLN COMPARISON:  Prior head CT and MRI from earlier the same day. FINDINGS: CTA NECK FINDINGS Aortic arch: Visualized aortic arch normal in caliber without aneurysm or other acute finding. Bovine  arch with common origin of the right brachiocephalic and left common carotid artery noted. No hemodynamically significant stenosis seen about the origin of the great vessels. Visualized subclavian arteries patent without stenosis. Right carotid system: Right CCA patent from its origin to the bifurcation without stenosis. Eccentric soft plaque at the origin of the right ICA with mild 35% stenosis by NASCET criteria. Right ICA diminutive but otherwise patent to the skull base without additional stenosis or occlusion. Left carotid system: Left CCA  patent from its origin to the bifurcation without stenosis. No significant atheromatous narrowing about the left bifurcation. Left ICA also diffusely diminutive but patent to the skull base without additional stenosis or occlusion. Vertebral arteries: Both vertebral arteries arise from the subclavian arteries. Left vertebral artery dominant and widely patent within the neck without stenosis, dissection, or occlusion. Right vertebral artery diffusely hypoplastic and is occluded at its origin, and remains largely occluded within the neck. Minimal scant distal reconstitution within the right V2 segment, which subsequently re occludes at the V3 segment. Right vertebral is occluded as it courses into the cranial vault. Skeleton: No acute osseous abnormality. No discrete lytic or blastic osseous lesions. Moderate cervical spondylosis at C5-6. Other neck: No other acute soft tissue abnormality within the neck. Salivary glands within normal limits. Thyroid normal. Enlarged left level IB node measures 12 mm in short axis (series 5, image 100). Additional mildly enlarged 11 mm right submental node (series 5, image 108). Findings are indeterminate, but could be reactive, although no acute inflammatory changes are seen within the neck. Upper chest: Visualized upper chest demonstrates no acute finding. Partially visualized lungs are grossly clear. Review of the MIP images confirms the above findings CTA HEAD FINDINGS Anterior circulation: ICAs markedly diminutive bilaterally, but patent as they course into the cranial vault. Petrous segments widely patent. Scattered atheromatous plaque within the cavernous/supraclinoid ICAs bilaterally. Associated moderate stenosis at the anterior genu of the cavernous left ICA. There is a severe near occlusive stenosis at the para clinoid right ICA (series 7, image 28). There is severe near occlusive stenosis at the right ICA terminus (series 7, image 22). Right A1 segment patent. Left ICA  terminus is perfused. Occlusion of the left A1 segment at its origin, with some retrograde filling across the circle-of-Willis. Grossly normal anterior communicating artery complex. There is occlusion of the distal right A2 segment (series 7, image 98). Left ACA demonstrates multifocal severe stenoses and demonstrates markedly attenuated flow, although does remain patent to its distal aspect. Severe fairly long segment stenosis seen involving the left M1 segment (series 7, image 25). Left MCA remains severely stenotic nearly to the level of the bifurcation, with only faintly visible flow. Markedly attenuated flow is seen distally within the left MCA branches, which also demonstrate small vessel atheromatous irregularity. On the right, the severe stenosis involving the right ICA terminus extends into the proximal-mid right M1 segment which demonstrates markedly diminutive flow. Distal right M1 segment patent. Negative right MCA bifurcation. Distal right MCA branches are perfused, although demonstrates diffuse small vessel atheromatous irregularity. Posterior circulation: Dominant left vertebral artery demonstrates scattered atheromatous irregularity but is patent to the vertebrobasilar junction without stenosis. Right vertebral occluded as it courses into the cranial vault. Retrograde filling of the distal right V4 segment two perfuse the right PICA. Left PICA not seen. Basilar patent to its distal aspect without stenosis. Superior cerebral arteries patent bilaterally. Both of the posterior cerebral arteries primarily supplied via the basilar. Left PCA perfused to its distal  aspect without stenosis. Short-segment severe distal right P2 stenosis noted (series 10, image 25). Venous sinuses: Grossly patent, although not well assessed due to timing of the contrast bolus. Anatomic variants: None significant. Review of the MIP images confirms the above findings IMPRESSION: 1. Severe atherosclerotic change throughout the  intracranial circulation with multifocal severe stenoses involving the cavernous right ICA and M1 segments bilaterally. Secondary severely attenuated flow throughout both MCA distributions, left worse than right. 2. Occluded right A2 segment. Left ACA severely diseased and attenuated but remains patent. 3. Occluded left A1 segment. 4. Hypoplastic and occluded right vertebral artery. Dominant left vertebral artery patent without significant stenosis. Retrograde filling of the distal right V4 segment with perfusion of the right PICA. 5. Severe distal right P2 stenosis. 6. Otherwise relative patency of the major arterial vasculature of the neck, although the ICAs are diffusely diminutive and hypoplastic bilaterally. 7. Mildly enlarged level I and left level II lymph nodes as above, indeterminate, but could be reactive. Electronically Signed   By: Rise Mu M.D.   On: 09/13/2019 23:24   Mr Brain Wo Contrast  Result Date: 09/13/2019 CLINICAL DATA:  Initial evaluation for acute altered mental status, stroke suspected. EXAM: MRI HEAD WITHOUT CONTRAST TECHNIQUE: Multiplanar, multiecho pulse sequences of the brain and surrounding structures were obtained without intravenous contrast. COMPARISON:  Prior head CT from earlier the same day. FINDINGS: Brain: Examination moderately degraded by motion artifact. Generalized age-related cerebral atrophy. Large area of encephalomalacia and gliosis involving the right frontotemporal region and right basal ganglia compatible with chronic right MCA territory infarct. Superimposed areas of chronic hemosiderin staining. Associated wallerian degeneration at the right cerebral peduncle. Confluent area of restricted diffusion involving the left anterior genu of the corpus callosum with extension into the overlying subcortical left frontal lobe, compatible with acute to early subacute left ACA territory infarct. Finding corresponds with hypodensity on prior CT. No associated  hemorrhage or significant mass effect. No other evidence for acute or subacute ischemia. No other areas of chronic cortical infarction. No acute intracranial hemorrhage. No mass lesion, midline shift or mass effect mild ex vacuo dilatation right lateral ventricle related to the chronic right MCA territory infarct. No hydrocephalus. No extra-axial fluid collection. Pituitary gland suprasellar region normal. Vascular: Major intracranial vascular flow voids are grossly maintained at the skull base. Skull and upper cervical spine: Craniocervical junction within normal limits. No focal marrow replacing lesion. No scalp soft tissue abnormality. Sinuses/Orbits: Globes and orbital soft tissues within normal limits. Paranasal sinuses are largely clear. No mastoid effusion. Other: None. IMPRESSION: 1. Moderate-sized acute to early subacute left ACA territory infarct. No associated hemorrhage or mass effect. 2. Large chronic right MCA territory infarct. 3. Underlying age-related cerebral atrophy. Electronically Signed   By: Rise Mu M.D.   On: 09/13/2019 19:39   Dg Foot 2 Views Left  Result Date: 09/13/2019 CLINICAL DATA:  Initial evaluation for acute left foot pain. EXAM: LEFT FOOT - 2 VIEW COMPARISON:  None. FINDINGS: There is an acute minimally displaced fracture through the base of the left fifth metatarsal. No other acute fracture dislocation. 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 Osteopenia. No soft tissue abnormality. IMPRESSION: 1. Acute minimally displaced fracture through the base of the left fifth metatarsal. 2. Osteopenia. Electronically Signed   By: Rise Mu M.D.   On: 09/13/2019 20:25   Vas [place] Lower Extremity Venous (dvt)  Result Date: 09/14/2019  Lower Venous Study Indications: Edema.  Performing Technologist:  Antonieta Pert RDMS, RVT  Examination Guidelines: A complete evaluation includes B-mode imaging, spectral  Doppler, color Doppler, and power Doppler as needed of all accessible portions of each vessel. Bilateral testing is considered an integral part of a complete examination. Limited examinations for reoccurring indications may be performed as noted.  +---------+---------------+---------+-----------+----------+--------------+ RIGHT    CompressibilityPhasicitySpontaneityPropertiesThrombus Aging +---------+---------------+---------+-----------+----------+--------------+ CFV      Full           Yes      Yes                                 +---------+---------------+---------+-----------+----------+--------------+ SFJ      Full                                                        +---------+---------------+---------+-----------+----------+--------------+ FV Prox  Full                                                        +---------+---------------+---------+-----------+----------+--------------+ FV Mid   Full                                                        +---------+---------------+---------+-----------+----------+--------------+ FV DistalFull                                                        +---------+---------------+---------+-----------+----------+--------------+ PFV      Full                                                        +---------+---------------+---------+-----------+----------+--------------+ POP      Full           Yes      Yes                                 +---------+---------------+---------+-----------+----------+--------------+ PTV      Full                                                        +---------+---------------+---------+-----------+----------+--------------+ PERO     Full                                                        +---------+---------------+---------+-----------+----------+--------------+  GSV      Full                                                         +---------+---------------+---------+-----------+----------+--------------+   +---------+---------------+---------+-----------+----------+--------------+ LEFT     CompressibilityPhasicitySpontaneityPropertiesThrombus Aging +---------+---------------+---------+-----------+----------+--------------+ CFV      Full           Yes      Yes                                 +---------+---------------+---------+-----------+----------+--------------+ SFJ      Full                                                        +---------+---------------+---------+-----------+----------+--------------+ FV Prox  Full                                                        +---------+---------------+---------+-----------+----------+--------------+ FV Mid   Full                                                        +---------+---------------+---------+-----------+----------+--------------+ FV DistalFull                                                        +---------+---------------+---------+-----------+----------+--------------+ PFV      Full                                                        +---------+---------------+---------+-----------+----------+--------------+ POP      Full           Yes      Yes                                 +---------+---------------+---------+-----------+----------+--------------+ PTV      Full                                                        +---------+---------------+---------+-----------+----------+--------------+ PERO     Full                                                        +---------+---------------+---------+-----------+----------+--------------+  GSV      Full                                                        +---------+---------------+---------+-----------+----------+--------------+     Summary: Right: There is no evidence of deep vein thrombosis in the lower extremity. No cystic structure found in the  popliteal fossa. Left: There is no evidence of deep vein thrombosis in the lower extremity. No cystic structure found in the popliteal fossa.  *See table(s) above for measurements and observations. Electronically signed by Lemar LivingsBrandon Cain MD on 09/14/2019 at 2:15:44 PM.    Final    EEG -Continuous slow, generalized. IMPRESSION: This study is suggestive of mild diffuse encephalopathy, nonspecific to etiology. No seizures or epileptiform discharges were seen throughout the recording.  PHYSICAL EXAM  Temp:  [98 F (36.7 C)-98.7 F (37.1 C)] 98.3 F (36.8 C) (10/23 0431) Pulse Rate:  [89-99] 91 (10/23 0431) Resp:  [16-18] 16 (10/22 1559) BP: (105-163)/(59-91) 152/76 (10/23 0431) SpO2:  [94 %-100 %] 99 % (10/23 0431) Weight:  [75.6 kg] 75.6 kg (10/22 1408)  General - Well nourished, well developed, in no apparent distress.  Ophthalmologic - fundi not visualized due to noncooperation.  Cardiovascular - Regular rhythm and rate.  Mental Status -  Level of arousal and orientation to self, age, place, and person were intact, however, not orientated to time Language including expression, naming, repetition, comprehension was assessed and found intact.  Cranial Nerves II - XII - II - Visual field intact OU III, IV, VI - Extraocular movements intact. V - Facial sensation intact bilaterally. VII - left mild facial droop. VIII - Hearing & vestibular intact bilaterally. X - Palate elevates symmetrically. XI - Chin turning & shoulder shrug intact bilaterally. XII - Tongue protrusion intact.  Motor Strength - The patient's strength was normal in RUE and RLE, however, LUE spastic with contracture at hand and elbow, 0/5 proximal and distal. LLE proximal 3/5, knee extension 3+/5 and distal DF/PF 0/5.  Bulk was normal and fasciculations were absent.   Motor Tone - Muscle tone was assessed at the neck and appendages and was increased on the LUE and LLE.  Reflexes - The patient's reflexes were 3+ LUE  and LLE and she had left babinski.  Sensory - Light touch, temperature/pinprick were assessed and were symmetrical.    Coordination - The patient had normal movements in the right hand with no ataxia or dysmetria.  Tremor was absent.  Gait and Station - deferred.   ASSESSMENT/PLAN Ms. Christina Evans is a 58 y.o. 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 In ED found to be confused.   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 Old large R MCA infarct. Subacute or old L frontal lobe infarct   MRI  Moderate early subacute L ACA infarct. Large old R MCA infarct. Atrophy.   CTA head & neck severe intracranial atherosclerosis (cavernous R ICA, B M1 L<R, L ACA, R P2 stenosis; occluded R A2, L A1, R VA). B ICAs small  and hypoplastic.   LE Doppler no DVT  2D Echo EF 60-65%  EEG mild diffuse encephalopathy, no sz   Diagnostic angio showed occluded left ICA at terminus wheeze extensive collateral, 50% stenosis right ICA siphon, right MCA proximal near occlusion with extensive collaterals, consistent with secondary moyamoya disease.  LDL 145  HgbA1c 9.8  UDS neg  Hypercoagulable labs negative so far with some are still pending  Lovenox 40 mg sq daily for VTE prophylaxis  No antithrombotic prior to admission, now on aspirin 325 mg daily and plavix  DAPT. Continue on discharge.   Therapy recommendations:  SNF  Disposition:  pending   Secondary moyamoya  CTA head & neck severe intracranial atherosclerosis (cavernous R ICA, B M1 L<R, L ACA).  Diagnostic angio occluded left ICA at terminus wheeze extensive collateral, 50% stenosis right ICA siphon, right MCA proximal near  occlusion with extensive collaterals, consistent with secondary moyamoya disease.  Likely due to uncontrolled risk factors and medication non compliance  Continue DAPT long term  Continue high dose statin  Daughter is willing to supervise pt medication and medical appointment  Hypertension  Stable on the low side  Home meds including HCTZ and valsartan  No BP at this time  Avoid low BP . Permissive hypertension (OK if < 220/120) but gradually towards goal in 5-7 days . Long-term BP goal 130-150 given moyamoya picture  Hyperlipidemia  Home meds:  lipitor 80 and fish oil, statin resumed in hospital  LDL 145, goal < 70  Continue high dose statin and fish oil at discharge  Diabetes type II Uncontrolled  HgbA1c 9.8, goal < 7.0  CBGs  SSI  Hyperglycemia  On lantus  Not compliant at home  DB coordinator following  Daughter parcilla will supervise after discharge  Other Stroke Risk Factors  Former Cigarette smoker  ETOH use, advised to drink no more than 1 drink(s) a day  Hx stroke/TIA  Years ago with old large R MCA infarct and left spastic hemiparesis, details not available   Other Active Problems  L 5th metatarsal fx - ortho on board - likely chronic, rec post op shoe and 50% WB  Depression on celexa  Asthma on albuterol prn  Hospital day # 1  Neurology will sign off. Please call with questions. Pt will follow up with stroke clinic Dr. Pearlean Brownie at [facility] in about 4 weeks. Thanks for the consult.   Marvel Plan, MD PhD Stroke Neurology 09/15/2019 11:10 AM   To contact Stroke Continuity provider, please refer to [email or web address]. After hours, contact General Neurology

## 2019-09-15 NOTE — Consult Note (Signed)
Chief Complaint: Patient was seen in consultation today for bilateral ICA and MCA stenosis/diagnostic cerebral arteriogram.  Referring Physician(s): Rosalin Hawking  Supervising Physician: Luanne Bras  Patient Status: [facility] - In-pt  History of Present Illness: Christina Evans is a 58 y.o. 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 She presented to [facility] ED with complaint of AMS, after being found by her daughter in her bedroom beside the bed with urine soaked clothing. She apparently did not recognize her daughter per report. In ED, CT head revealed old infarcts (old large right MCA territory, left frontal lobe- possibly subacute to old). MR brain revealed acute to early subacute left ACA territory infarct. She was admitted for further management. EEG revealed mild diffuse encephalopathy without evidence of seizures or epileptiform discharges. Neurology was consulted who recommended CTA (which revealed multiple areas of severe atherosclerotic changes intracranially- possible moyamoya) and NIR consultation for possible diagnostic cerebral arteriogram.  CT head 09/13/2019: 1. No acute intracranial hemorrhage. 2. Large right MCA territory old infarct and encephalomalacia. 3. Probable focal area of subacute or old infarct in the left frontal lobe. Clinical correlation is recommended.  MR brain 09/13/2019: 1. Moderate-sized acute to early subacute left ACA territory infarct. No associated hemorrhage or mass effect. 2. Large chronic right MCA territory infarct. 3. Underlying age-related cerebral atrophy.  CTA head/neck 09/13/2019: 1. 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 Secondary severely attenuated flow throughout both MCA distributions, left worse than  right. 2. Occluded right A2 segment. Left ACA severely diseased and attenuated but remains patent. 3. Occluded left A1 segment. 4. Hypoplastic and occluded right vertebral artery. Dominant left vertebral artery patent without significant stenosis. Retrograde filling of the distal right V4 segment with perfusion of the right PICA. 5. Severe distal right P2 stenosis. 6. Otherwise relative patency of the major arterial vasculature of the neck, although the ICAs are diffusely diminutive and hypoplastic bilaterally. 7. Mildly enlarged level I and left level II lymph nodes as above, indeterminate, but could be reactive.  NIR consulted by Dr. Erlinda Hong for possible image-guided diagnostic cerebral arteriogram to evaluate intracranial stenosis. Patient awake and alert laying in bed with no complaints at this time. Denies fever, chills, chest pain, dyspnea, abdominal pain, headache, weakness, numbness/tingling, dizziness, or vision changes.  Currently taking Plavix 75 mg once daily and Aspirin 325 mg once daily.   Past Medical History:  Diagnosis Date   Asthma    CVA (cerebral vascular accident) ([place])    Diabetes mellitus without complication ([place])     Past Surgical History:  Procedure Laterality Date   NO PAST SURGERIES      Allergies: Tape  Medications: Prior to Admission medications   Medication Sig Start Date End Date Taking? Authorizing Provider  acetaminophen (TYLENOL) 325 MG tablet Take 325-650 mg by mouth every 6 (six) hours as needed (for headaches or pain).    [provider]  albuterol (PROAIR HFA) 108 (90 Base) MCG/ACT inhaler Inhale 2 puffs into the lungs 4 (four) times daily as needed for wheezing or shortness of breath.     [provider]  atorvastatin (LIPITOR) 80 MG tablet Take 1 tablet (80 mg total) by mouth daily at 6 PM. 09/03/17   Cheryln Manly, NP  blood glucose meter kit and supplies Dispense based on patient and insurance preference. Use up to  four times daily as directed. (FOR ICD-9 250.00, 250.01). 09/02/17   Cheryln Manly, NP  citalopram (CELEXA) 40 MG tablet Take 40 mg by mouth daily.    [provider]  insulin aspart (NOVOLOG FLEXPEN) 100 UNIT/ML FlexPen Inject 15 Units into the skin 3 (three) times daily with meals.    [provider]  Insulin Glargine (LANTUS SOLOSTAR) 100 UNIT/ML Solostar Pen Inject 50 Units into the skin 2 (two) times daily.    [provider]  insulin lispro (HUMALOG) 100 UNIT/ML KwikPen  08/04/19   [provider]  metFORMIN (GLUCOPHAGE) 500 MG tablet Take 1 tablet (500 mg total) by mouth 2 (two) times daily with a meal. Patient not taking: Reported on 09/14/2019 09/02/17   Reino Bellis B, NP  metFORMIN (GLUCOPHAGE-XR) 500 MG 24 hr tablet Take 1,000 mg by mouth 2 (two) times daily. 03/29/19   [provider]  metoprolol tartrate (LOPRESSOR) 25 MG tablet Take 0.5 tablets (12.5 mg total) by mouth 2 (two) times daily. 09/02/17   Cheryln Manly, NP  omega-3 acid ethyl esters (LOVAZA) 1 g capsule Take 1 capsule (1 g total) by mouth 2 (two) times daily. 09/02/17   Cheryln Manly, NP  valsartan-hydrochlorothiazide (DIOVAN-HCT) 160-12.5 MG tablet Take 1 tablet by mouth daily. 09/02/17   Cheryln Manly, NP  venlafaxine XR (EFFEXOR-XR) 37.5 MG 24 hr capsule Take 37.5 mg by mouth daily. 03/29/19   [provider]     History reviewed. No pertinent family history.  Social History   Socioeconomic History   Marital status: Single    Spouse name: Not on file   Number of children: Not on file   Years of education: Not on file   Highest education level: Not on file  Occupational History   Not on file  Social Needs   Financial resource strain: Not on file   Food insecurity    Worry: Not on file    Inability: Not on file   Transportation needs    Medical: Not on file    Non-medical: Not on file  Tobacco Use   Smoking status: Former  Smoker   Smokeless tobacco: Never Used   Tobacco comment: QUIT IN 2008  Substance and Sexual Activity   Alcohol use: Yes    Comment: RARE   Drug use: No   Sexual activity: Not on file  Lifestyle   Physical activity    Days per week: Not on file    Minutes per session: Not on file   Stress: Not on file  Relationships   Social connections    Talks on phone: Not on file    Gets together: Not on file    Attends religious service: Not on file    Active member of club or organization: Not on file    Attends meetings of clubs or organizations: Not on file    Relationship status: Not on file  Other Topics Concern   Not on file  Social History Narrative   Not on file     Review of Systems: A 12 point ROS discussed and pertinent positives are indicated in the HPI above.  All other systems are negative.  Review of Systems  Constitutional: Negative for chills and fever.  Eyes: Negative for visual disturbance.  Respiratory: Negative for shortness of breath and wheezing.   Cardiovascular: Negative for chest pain and palpitations.  Gastrointestinal: Negative for abdominal pain.  Neurological: Negative for dizziness, weakness, numbness and headaches.  Psychiatric/Behavioral:  Negative for behavioral problems and confusion.    Vital Signs: BP (!) 152/76 (BP Location: Right Arm)    Pulse 91    Temp 98.3 F (36.8 C) (Oral)    Resp 16    Ht 5' (1.524 m)    Wt 166 lb 10.7 oz (75.6 kg)    SpO2 99%    BMI 32.55 kg/m   Physical Exam Vitals signs and nursing note reviewed.  Constitutional:      General: She is not in acute distress.    Appearance: Normal appearance.  Cardiovascular:     Rate and Rhythm: Normal rate and regular rhythm.     Heart sounds: Normal heart sounds. No murmur.  Pulmonary:     Effort: Pulmonary effort is normal. No respiratory distress.     Breath sounds: Normal breath sounds. No wheezing.  Skin:    General: Skin is warm and dry.  Neurological:      Mental Status: She is alert and oriented to person, place, and time.      MD Evaluation Airway: WNL Heart: WNL Abdomen: WNL Chest/ Lungs: WNL ASA  Classification: 3 Mallampati/Airway Score: Two   Imaging: Ct Angio Head W Or Wo Contrast  Result Date: 09/13/2019 CLINICAL DATA:  Initial evaluation for acute altered mental status. Known acute to subacute left frontal stroke. EXAM: CT ANGIOGRAPHY HEAD AND NECK TECHNIQUE: Multidetector CT imaging of the head and neck was performed using the standard protocol during bolus administration of intravenous contrast. Multiplanar CT image reconstructions and MIPs were obtained to evaluate the vascular anatomy. Carotid stenosis measurements (when applicable) are obtained utilizing NASCET criteria, using the distal internal carotid diameter as the denominator. CONTRAST:  80m OMNIPAQUE IOHEXOL 350 MG/ML SOLN COMPARISON:  Prior head CT and MRI from earlier the same day. FINDINGS: CTA NECK FINDINGS Aortic arch: Visualized aortic arch normal in caliber without aneurysm or other acute finding. Bovine arch with common origin of the right brachiocephalic and left common carotid artery noted. No hemodynamically significant stenosis seen about the origin of the great vessels. Visualized subclavian arteries patent without stenosis. Right carotid system: Right CCA patent from its origin to the bifurcation without stenosis. Eccentric soft plaque at the origin of the right ICA with mild 35% stenosis by NASCET criteria. Right ICA diminutive but otherwise patent to the skull base without additional stenosis or occlusion. Left carotid system: Left CCA patent from its origin to the bifurcation without stenosis. No significant atheromatous narrowing about the left bifurcation. Left ICA also diffusely diminutive but patent to the skull base without additional stenosis or occlusion. Vertebral arteries: Both vertebral arteries arise from the subclavian arteries. Left vertebral artery  dominant and widely patent within the neck without stenosis, dissection, or occlusion. Right vertebral artery diffusely hypoplastic and is occluded at its origin, and remains largely occluded within the neck. Minimal scant distal reconstitution within the right V2 segment, which subsequently re occludes at the V3 segment. Right vertebral is occluded as it courses into the cranial vault. Skeleton: No acute osseous abnormality. No discrete lytic or blastic osseous lesions. Moderate cervical spondylosis at C5-6. Other neck: No other acute soft tissue abnormality within the neck. Salivary glands within normal limits. Thyroid normal. Enlarged left level IB node measures 12 mm in short axis (series 5, image 100). Additional mildly enlarged 11 mm right submental node (series 5, image 108). Findings are indeterminate, but could be reactive, although no acute inflammatory changes are seen within the neck. Upper chest: Visualized upper chest demonstrates  no acute finding. Partially visualized lungs are grossly clear. Review of the MIP images confirms the above findings CTA HEAD FINDINGS Anterior circulation: ICAs markedly diminutive bilaterally, but patent as they course into the cranial vault. Petrous segments widely patent. Scattered atheromatous plaque within the cavernous/supraclinoid ICAs bilaterally. Associated moderate stenosis at the anterior genu of the cavernous left ICA. There is a severe near occlusive stenosis at the para clinoid right ICA (series 7, image 28). There is severe near occlusive stenosis at the right ICA terminus (series 7, image 22). Right A1 segment patent. Left ICA terminus is perfused. Occlusion of the left A1 segment at its origin, with some retrograde filling across the circle-of-Willis. Grossly normal anterior communicating artery complex. There is occlusion of the distal right A2 segment (series 7, image 98). Left ACA demonstrates multifocal severe stenoses and demonstrates markedly  attenuated flow, although does remain patent to its distal aspect. Severe fairly long segment stenosis seen involving the left M1 segment (series 7, image 25). Left MCA remains severely stenotic nearly to the level of the bifurcation, with only faintly visible flow. Markedly attenuated flow is seen distally within the left MCA branches, which also demonstrate small vessel atheromatous irregularity. On the right, the severe stenosis involving the right ICA terminus extends into the proximal-mid right M1 segment which demonstrates markedly diminutive flow. Distal right M1 segment patent. Negative right MCA bifurcation. Distal right MCA branches are perfused, although demonstrates diffuse small vessel atheromatous irregularity. Posterior circulation: Dominant left vertebral artery demonstrates scattered atheromatous irregularity but is patent to the vertebrobasilar junction without stenosis. Right vertebral occluded as it courses into the cranial vault. Retrograde filling of the distal right V4 segment two perfuse the right PICA. Left PICA not seen. Basilar patent to its distal aspect without stenosis. Superior cerebral arteries patent bilaterally. Both of the posterior cerebral arteries primarily supplied via the basilar. Left PCA perfused to its distal aspect without stenosis. Short-segment severe distal right P2 stenosis noted (series 10, image 25). Venous sinuses: Grossly patent, although not well assessed due to timing of the contrast bolus. Anatomic variants: None significant. Review of the MIP images confirms the above findings IMPRESSION: 1. 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 Secondary severely attenuated flow throughout both MCA distributions, left worse than right. 2. Occluded right A2 segment. Left ACA severely diseased and attenuated but remains patent. 3. Occluded left A1  segment. 4. Hypoplastic and occluded right vertebral artery. Dominant left vertebral artery patent without significant stenosis. Retrograde filling of the distal right V4 segment with perfusion of the right PICA. 5. Severe distal right P2 stenosis. 6. Otherwise relative patency of the major arterial vasculature of the neck, although the ICAs are diffusely diminutive and hypoplastic bilaterally. 7. Mildly enlarged level I and left level II lymph nodes as above, indeterminate, but could be reactive. Electronically Signed   By: Jeannine Boga M.D.   On: 09/13/2019 23:24   Dg Chest 2 View  Result Date: 09/13/2019 CLINICAL DATA:  Initial evaluation for acute altered mental status, history of asthma, former smoker. EXAM: CHEST - 2 VIEW COMPARISON:  Prior radiograph from 09/01/2017. FINDINGS: Mild cardiomegaly, stable. Mediastinal silhouette within normal limits. Lungs hypoinflated. Mild diffuse pulmonary vascular congestion with interstitial prominence, suggesting pulmonary interstitial congestion. No frank pulmonary edema. No pleural effusion. No focal infiltrates. No pneumothorax. No acute osseous finding. IMPRESSION: 1. Cardiomegaly with mild diffuse pulmonary interstitial congestion without frank pulmonary edema. 2.  No other active cardiopulmonary disease. Electronically Signed   By: Jeannine Boga M.D.   On: 09/13/2019 20:23   Ct Head Wo Contrast  Result Date: 09/13/2019 CLINICAL DATA:  58 year old female with altered mental status. EXAM: CT HEAD WITHOUT CONTRAST TECHNIQUE: Contiguous axial images were obtained from the base of the skull through the vertex without intravenous contrast. COMPARISON:  None. FINDINGS: Brain: There is mild age-related atrophy. Large area of old infarct and encephalomalacia noted in the right MCA territory. There is associated mild ex vacuo dilatation of the right lateral ventricle. A focal area of low attenuation in the left frontal lobe abutting the frontal horn of  the left lateral ventricle most likely represents an area of subacute or old infarct. Clinical correlation is recommended. MRI may provide better characterization if clinically indicated. There is no acute intracranial hemorrhage. No mass effect or midline shift. No extra-axial fluid collection. Vascular: No hyperdense vessel or unexpected calcification. Skull: Normal. Negative for fracture or focal lesion. Sinuses/Orbits: No acute finding. Other: None IMPRESSION: 1. No acute intracranial hemorrhage. 2. Large right MCA territory old infarct and encephalomalacia. 3. Probable focal area of subacute or old infarct in the left frontal lobe. Clinical correlation is recommended. Electronically Signed   By: Anner Crete M.D.   On: 09/13/2019 14:48   Ct Angio Neck W Or Wo Contrast  Result Date: 09/13/2019 CLINICAL DATA:  Initial evaluation for acute altered mental status. Known acute to subacute left frontal stroke. EXAM: CT ANGIOGRAPHY HEAD AND NECK TECHNIQUE: Multidetector CT imaging of the head and neck was performed using the standard protocol during bolus administration of intravenous contrast. Multiplanar CT image reconstructions and MIPs were obtained to evaluate the vascular anatomy. Carotid stenosis measurements (when applicable) are obtained utilizing NASCET criteria, using the distal internal carotid diameter as the denominator. CONTRAST:  74m OMNIPAQUE IOHEXOL 350 MG/ML SOLN COMPARISON:  Prior head CT and MRI from earlier the same day. FINDINGS: CTA NECK FINDINGS Aortic arch: Visualized aortic arch normal in caliber without aneurysm or other acute finding. Bovine arch with common origin of the right brachiocephalic and left common carotid artery noted. No hemodynamically significant stenosis seen about the origin of the great vessels. Visualized subclavian arteries patent without stenosis. Right carotid system: Right CCA patent from its origin to the bifurcation without stenosis. Eccentric soft plaque  at the origin of the right ICA with mild 35% stenosis by NASCET criteria. Right ICA diminutive but otherwise patent to the skull base without additional stenosis or occlusion. Left carotid system: Left CCA patent from its origin to the bifurcation without stenosis. No significant atheromatous narrowing about the left bifurcation. Left ICA also diffusely diminutive but patent to the skull base without additional stenosis or occlusion. Vertebral arteries: Both vertebral arteries arise from the subclavian arteries. Left vertebral artery dominant and widely patent within the neck without stenosis, dissection, or occlusion. Right vertebral artery diffusely hypoplastic and is occluded at its origin, and remains largely occluded within the neck. Minimal scant distal reconstitution within the right V2 segment, which subsequently re occludes at the V3 segment. Right vertebral is occluded as it courses into the cranial vault. Skeleton: No acute osseous abnormality. No discrete lytic or blastic osseous lesions. Moderate cervical spondylosis at C5-6. Other neck: No other acute soft tissue abnormality within the neck. Salivary glands within normal limits. Thyroid normal. Enlarged left level IB node measures 12 mm in short axis (series 5, image 100). Additional mildly enlarged 11 mm right submental node (series 5, image  108). Findings are indeterminate, but could be reactive, although no acute inflammatory changes are seen within the neck. Upper chest: Visualized upper chest demonstrates no acute finding. Partially visualized lungs are grossly clear. Review of the MIP images confirms the above findings CTA HEAD FINDINGS Anterior circulation: ICAs markedly diminutive bilaterally, but patent as they course into the cranial vault. Petrous segments widely patent. Scattered atheromatous plaque within the cavernous/supraclinoid ICAs bilaterally. Associated moderate stenosis at the anterior genu of the cavernous left ICA. There is a  severe near occlusive stenosis at the para clinoid right ICA (series 7, image 28). There is severe near occlusive stenosis at the right ICA terminus (series 7, image 22). Right A1 segment patent. Left ICA terminus is perfused. Occlusion of the left A1 segment at its origin, with some retrograde filling across the circle-of-Willis. Grossly normal anterior communicating artery complex. There is occlusion of the distal right A2 segment (series 7, image 98). Left ACA demonstrates multifocal severe stenoses and demonstrates markedly attenuated flow, although does remain patent to its distal aspect. Severe fairly long segment stenosis seen involving the left M1 segment (series 7, image 25). Left MCA remains severely stenotic nearly to the level of the bifurcation, with only faintly visible flow. Markedly attenuated flow is seen distally within the left MCA branches, which also demonstrate small vessel atheromatous irregularity. On the right, the severe stenosis involving the right ICA terminus extends into the proximal-mid right M1 segment which demonstrates markedly diminutive flow. Distal right M1 segment patent. Negative right MCA bifurcation. Distal right MCA branches are perfused, although demonstrates diffuse small vessel atheromatous irregularity. Posterior circulation: Dominant left vertebral artery demonstrates scattered atheromatous irregularity but is patent to the vertebrobasilar junction without stenosis. Right vertebral occluded as it courses into the cranial vault. Retrograde filling of the distal right V4 segment two perfuse the right PICA. Left PICA not seen. Basilar patent to its distal aspect without stenosis. Superior cerebral arteries patent bilaterally. Both of the posterior cerebral arteries primarily supplied via the basilar. Left PCA perfused to its distal aspect without stenosis. Short-segment severe distal right P2 stenosis noted (series 10, image 25). Venous sinuses: Grossly patent, although  not well assessed due to timing of the contrast bolus. Anatomic variants: None significant. Review of the MIP images confirms the above findings IMPRESSION: 1. 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 Secondary severely attenuated flow throughout both MCA distributions, left worse than right. 2. Occluded right A2 segment. Left ACA severely diseased and attenuated but remains patent. 3. Occluded left A1 segment. 4. Hypoplastic and occluded right vertebral artery. Dominant left vertebral artery patent without significant stenosis. Retrograde filling of the distal right V4 segment with perfusion of the right PICA. 5. Severe distal right P2 stenosis. 6. Otherwise relative patency of the major arterial vasculature of the neck, although the ICAs are diffusely diminutive and hypoplastic bilaterally. 7. Mildly enlarged level I and left level II lymph nodes as above, indeterminate, but could be reactive. Electronically Signed   By: Jeannine Boga M.D.   On: 09/13/2019 23:24   Mr Brain Wo Contrast  Result Date: 09/13/2019 CLINICAL DATA:  Initial evaluation for acute altered mental status, stroke suspected. EXAM: MRI HEAD WITHOUT CONTRAST TECHNIQUE: Multiplanar, multiecho pulse sequences of the brain and surrounding structures were obtained without intravenous contrast. COMPARISON:  Prior head CT from earlier the same day. FINDINGS: Brain: Examination moderately degraded by motion artifact. Generalized age-related cerebral atrophy.  Large area of encephalomalacia and gliosis involving the right frontotemporal region and right basal ganglia compatible with chronic right MCA territory infarct. Superimposed areas of chronic hemosiderin staining. Associated wallerian degeneration at the right cerebral peduncle. Confluent area of restricted diffusion involving the left anterior genu of the corpus  callosum with extension into the overlying subcortical left frontal lobe, compatible with acute to early subacute left ACA territory infarct. Finding corresponds with hypodensity on prior CT. No associated hemorrhage or significant mass effect. No other evidence for acute or subacute ischemia. No other areas of chronic cortical infarction. No acute intracranial hemorrhage. No mass lesion, midline shift or mass effect mild ex vacuo dilatation right lateral ventricle related to the chronic right MCA territory infarct. No hydrocephalus. No extra-axial fluid collection. Pituitary gland suprasellar region normal. Vascular: Major intracranial vascular flow voids are grossly maintained at the skull base. Skull and upper cervical spine: Craniocervical junction within normal limits. No focal marrow replacing lesion. No scalp soft tissue abnormality. Sinuses/Orbits: Globes and orbital soft tissues within normal limits. Paranasal sinuses are largely clear. No mastoid effusion. Other: None. IMPRESSION: 1. Moderate-sized acute to early subacute left ACA territory infarct. No associated hemorrhage or mass effect. 2. Large chronic right MCA territory infarct. 3. Underlying age-related cerebral atrophy. Electronically Signed   By: Jeannine Boga M.D.   On: 09/13/2019 19:39   Dg Foot 2 Views Left  Result Date: 09/13/2019 CLINICAL DATA:  Initial evaluation for acute left foot pain. EXAM: LEFT FOOT - 2 VIEW COMPARISON:  None. FINDINGS: There is an acute minimally displaced fracture through the base of the left fifth metatarsal. No other acute fracture dislocation. 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 Osteopenia. No soft tissue abnormality. IMPRESSION: 1. Acute minimally displaced fracture through the base of the left fifth metatarsal. 2. Osteopenia. Electronically Signed   By: Jeannine Boga M.D.   On: 09/13/2019 20:25   Vas [place] Lower Extremity Venous  (dvt)  Result Date: 09/14/2019  Lower Venous Study Indications: Edema.  Performing Technologist: Antonieta Pert RDMS, RVT  Examination Guidelines: A complete evaluation includes B-mode imaging, spectral Doppler, color Doppler, and power Doppler as needed of all accessible portions of each vessel. Bilateral testing is considered an integral part of a complete examination. Limited examinations for reoccurring indications may be performed as noted.  +---------+---------------+---------+-----------+----------+--------------+  RIGHT     Compressibility Phasicity Spontaneity Properties Thrombus Aging  +---------+---------------+---------+-----------+----------+--------------+  CFV       Full            Yes       Yes                                    +---------+---------------+---------+-----------+----------+--------------+  SFJ       Full                                                             +---------+---------------+---------+-----------+----------+--------------+  FV Prox   Full                                                             +---------+---------------+---------+-----------+----------+--------------+  FV Mid    Full                                                             +---------+---------------+---------+-----------+----------+--------------+  FV Distal Full                                                             +---------+---------------+---------+-----------+----------+--------------+  PFV       Full                                                             +---------+---------------+---------+-----------+----------+--------------+  POP       Full            Yes       Yes                                    +---------+---------------+---------+-----------+----------+--------------+  PTV       Full                                                             +---------+---------------+---------+-----------+----------+--------------+  PERO      Full                                                              +---------+---------------+---------+-----------+----------+--------------+  GSV       Full                                                             +---------+---------------+---------+-----------+----------+--------------+   +---------+---------------+---------+-----------+----------+--------------+  LEFT      Compressibility Phasicity Spontaneity Properties Thrombus Aging  +---------+---------------+---------+-----------+----------+--------------+  CFV       Full            Yes       Yes                                    +---------+---------------+---------+-----------+----------+--------------+  SFJ       Full                                                             +---------+---------------+---------+-----------+----------+--------------+  FV Prox   Full                                                             +---------+---------------+---------+-----------+----------+--------------+  FV Mid    Full                                                             +---------+---------------+---------+-----------+----------+--------------+  FV Distal Full                                                             +---------+---------------+---------+-----------+----------+--------------+  PFV       Full                                                             +---------+---------------+---------+-----------+----------+--------------+  POP       Full            Yes       Yes                                    +---------+---------------+---------+-----------+----------+--------------+  PTV       Full                                                             +---------+---------------+---------+-----------+----------+--------------+  PERO      Full                                                             +---------+---------------+---------+-----------+----------+--------------+  GSV       Full                                                              +---------+---------------+---------+-----------+----------+--------------+     Summary: Right: There is no evidence of deep vein thrombosis in the lower extremity. No cystic structure found in the popliteal fossa. Left: There is no evidence of deep vein thrombosis in the lower extremity. No cystic structure found in the popliteal fossa.  *See table(s) above for measurements and observations. Electronically signed  by Servando Snare MD on 09/14/2019 at 2:15:44 PM.    Final     Labs:  CBC: Recent Labs    09/13/19 1257 09/13/19 1339 09/14/19 0227 09/15/19 0643  WBC 9.5  --  10.3 8.6  HGB 14.5 15.0 13.1 14.2  HCT 44.8 44.0 39.7 42.6  PLT 370  --  331 332    COAGS: Recent Labs    09/13/19 1317  INR 0.9    BMP: Recent Labs    09/13/19 1257 09/13/19 1339 09/14/19 0227 09/15/19 0643  NA 142 141 138 136  K 3.8 3.7 3.6 3.5  CL 104  --  104 102  CO2 26  --  24 23  GLUCOSE 255*  --  296* 220*  BUN 31*  --  29* 12  CALCIUM 9.5  --  8.7* 8.7*  CREATININE 0.55  --  0.60 0.46  GFRNONAA >60  --  >60 >60  GFRAA >60  --  >60 >60    LIVER FUNCTION TESTS: Recent Labs    09/13/19 1257  BILITOT 0.7  AST 19  ALT 27  ALKPHOS 78  PROT 8.0  ALBUMIN 3.9     Assessment and Plan:  Bilateral ICA and MCA stenosis. Plan for image-guided diagnostic cerebral arteriogram today in IR with Dr. Estanislado Pandy. Patient is NPO. Afebrile and WBCs WNL. Ok to proceed with Plavix/Aspirin use per Dr. Estanislado Pandy. INR 0.9 09/13/2019.  Risks and benefits of cerebral arteriogram were discussed with the patient including, but not limited to bleeding, infection, vascular injury, stroke, or contrast induced renal failure. This interventional procedure involves the use of X-rays and because of the nature of the planned procedure, it is possible that we will have prolonged use of X-ray fluoroscopy. Potential radiation risks to you include (but are not limited to) the following: - A slightly elevated risk for  cancer  several years later in life. This risk is typically less than 0.5% percent. This risk is low in comparison to the normal incidence of human cancer, which is 33% for women and 50% for men according to the [employer]. - Radiation induced injury can include skin redness, resembling a rash, tissue breakdown / ulcers and hair loss (which can be temporary or permanent).  The likelihood of either of these occurring depends on the difficulty of the procedure and whether you are sensitive to radiation due to previous procedures, disease, or genetic conditions.  IF your procedure requires a prolonged use of radiation, you will be notified and given written instructions for further action.  It is your responsibility to monitor the irradiated area for the 2 weeks following the procedure and to notify your physician if you are concerned that you have suffered a radiation induced injury.   All of the patient's questions were answered, patient is agreeable to proceed. Consent signed and in chart.   Thank you for this interesting consult.  I greatly enjoyed meeting Dan Dissinger and look forward to participating in their care.  A copy of this report was sent to the requesting provider on this date.  Electronically Signed: Earley Abide, PA-C 09/15/2019, 8:33 AM   I spent a total of 40 Minutes in face to face in clinical consultation, greater than 50% of which was counseling/coordinating care for bilateral ICA and MCA stenosis/diagnostic cerebral arteriogram.

## 2019-09-15 NOTE — Procedures (Addendum)
S/P bilateral common carotid ,Lt VA arteriogram and RT subclavian arteriograms. RT CFA approacjh. Findings. 1.Occluded Lt ICA at the terminus with extensive neovascularity from the Lt ant choroidal and Lt PCOM partially reconstituting Lt MCA distribution.. 2. 50 % stenosis of RT ICA supraclinoid seg.  3.Hair thin stenosis of RT MCA prox with extensive collaterals from medial and lateral lenticulostriate's reconstituting Rt MCA distribution.  4.OccludedRT VA at origin. 5.Patent RT ACA A1. S.Gavyn Ybarra MD

## 2019-09-15 NOTE — Progress Notes (Signed)
SLP Cancellation Note  Patient Details Name: Christina Evans MRN: [id number] DOB: 1961-10-29   Cancelled treatment:       Reason Eval/Treat Not Completed: Patient at procedure or test/unavailable.    Mandeep Ferch, Katherene Ponto 09/15/2019, 11:17 AM

## 2019-09-16 LAB — CARDIOLIPIN ANTIBODIES, IGG, IGM, IGA
Anticardiolipin IgA: <9 APL U/mL (ref 0–11)
Anticardiolipin IgG: <9 GPL U/mL (ref 0–14)
Anticardiolipin IgM: 16 MPL U/mL — ABNORMAL HIGH (ref 0–12)

## 2019-09-16 LAB — BASIC METABOLIC PANEL
Anion gap: 11 (ref 5–15)
BUN: 14 mg/dL (ref 6–20)
CO2: 24 mmol/L (ref 22–32)
Calcium: 8.7 mg/dL — ABNORMAL LOW (ref 8.9–10.3)
Chloride: 101 mmol/L (ref 98–111)
Creatinine, Ser: 0.48 mg/dL (ref 0.44–1.00)
GFR calc Af Amer: >60 mL/min (ref >60)
GFR calc non Af Amer: >60 mL/min (ref >60)
Glucose, Bld: 192 mg/dL — ABNORMAL HIGH (ref 70–99)
Potassium: 3.7 mmol/L (ref 3.5–5.1)
Sodium: 136 mmol/L (ref 135–145)

## 2019-09-16 LAB — GLUCOSE, CAPILLARY
Glucose-Capillary: 197 mg/dL — ABNORMAL HIGH (ref 70–99)
Glucose-Capillary: 211 mg/dL — ABNORMAL HIGH (ref 70–99)
Glucose-Capillary: 195 mg/dL — ABNORMAL HIGH (ref 70–99)
Glucose-Capillary: 253 mg/dL — ABNORMAL HIGH (ref 70–99)

## 2019-09-16 MED ORDER — ASPIRIN 325 MG PO TBEC: 325.0000 mg | DELAYED_RELEASE_TABLET | Freq: Every day | ORAL | 0 refills | Status: DC

## 2019-09-16 MED ORDER — CLOPIDOGREL BISULFATE 75 MG PO TABS: 75.0000 mg | ORAL_TABLET | Freq: Every day | ORAL | 0 refills | Status: DC

## 2019-09-16 MED ORDER — LANTUS SOLOSTAR 100 UNIT/ML ~~LOC~~ SOPN: 20.0000 [IU] | PEN_INJECTOR | Freq: Every day | SUBCUTANEOUS | 11 refills | Status: DC

## 2019-09-16 NOTE — Progress Notes (Signed)
At the request of daughter she asked if we could ambulate her mom the patient around the room just to see how she does before getting discharged.  I thought it was a good idea because daughter was declining our recommendation for SNF and saying she would take her home and care for her there.  It took [place] about 5 minutes (me and NT) just to get her to agree to stand up with walker.  She was very nervous and kept asking that we promise not to let her fall.  When we got her up after 5 minutes she would not take a step forward.  After standing for about 3 minutes with walker pt started gaze off and almost look as if she was going to faint  Me and NT sat her down immediately and got her back to laying in bed.  Pt now resting comfortably.  And daughter has now changed her mind.  She would like her mom to go to a SNF.  I paged Family Medicine and informed them because most of her discharge plan to home is already done.  Will follow up.

## 2019-09-16 NOTE — TOC Transition Note (Addendum)
Transition of Care [facility]) - CM/SW Discharge Note   Patient Details  Name: Christina Evans MRN: [id number] Date of Birth: February 18, 1961  [facility] CM/SW Contact:  Carles Collet, RN Phone Number: 09/16/2019, 1:30 PM   Clinical Narrative:   Damaris Schooner w patient's daughter, [name], on the phone. She states that she took care of her mom after her last stroke and wants to take her home again. She has been with her at the hospital to understand her current level of function. She requested RW and WC to be delivered to bedside prior to DC. She would like bed and other equipment to be delivered to the house. She has a room ready and cleared out for it and her husband will home all day today to accept equipment. She would like to take the patient home via private car.  [name] states she stays home and does not have children so she will be available to care for her 24/7. Her spouse will be home after 7 on evenings to help and her sister who is a stay at home mom who lives a mile away will also be available to help.  Notified [facility] of equipment needs, and [facility] is able to accept for [facility] services. Both agencies given DC address [place] [phone], and daughter's cell number. [place] letter provided to patient's daughter Notified resident of above.    Final next level of care: [place] Barriers to Discharge: No Barriers Identified   Patient Goals and CMS Choice Patient states their goals for this hospitalization and ongoing recovery are:: Get stronger CMS Medicare.gov Compare Post Acute Care list provided to:: Patient Represenative (must comment) Choice offered to / list presented to : Adult Children  Discharge Placement                       Discharge Plan and Services In-house Referral: Clinical Social Work Discharge Planning Services: CM Consult, Follow-up appt scheduled Post Acute Care Choice: (OP Rehab)          DME Arranged:  Hospital bed DME Agency: [employer] Date DME Agency Contacted: 09/16/19 Time DME Agency Contacted: [id number] Representative spoke with at DME Agency: [place], bed and 3/1 to be delivered to the house, RW and [place] o be delivered to the hospital room [place] Arranged: RN, PT, OT, Nurse's Aide [place] Agency: [place] Date [place]: 09/16/19 Time [place]: 1330 Representative spoke with at [place]: [place] ([place]) Interventions     Readmission Risk Interventions No flowsheet data found.

## 2019-09-16 NOTE — Care Management (Signed)
    Durable Medical Equipment  (From admission, onward)         Start     Ordered   09/16/19 1251  For home use only DME Hospital bed  Once    Question Answer Comment  Length of Need Lifetime   Patient has (list medical condition): multiple strokes involing right and left hemispheres, left sided weakness with contractures in, left foot metatarsal fracture, insulin dependent diabetes, HTN, HLD   The above medical condition requires: Patient requires the ability to reposition frequently   Head must be elevated greater than: 45 degrees   Bed type Semi-electric      09/16/19 1254   09/16/19 1217  For home use only DME Shower stool  Once     09/16/19 1216   09/16/19 1128  For home use only DME Bedside commode  Once    Question Answer Comment  Patient needs a bedside commode to treat with the following condition Limited mobility   Patient needs a bedside commode to treat with the following condition Stroke (cerebrum) ([place])      09/16/19 1128   09/16/19 1128  For home use only DME lightweight manual wheelchair with seat cushion  Once    Comments: Patient suffers from stroke which impairs their ability to perform daily activities like bathing, dressing, grooming and toileting in the home.  A cane, crutch or walker will not resolve  issue with performing activities of daily living. A wheelchair will allow patient to safely perform daily activities. Patient is not able to propel themselves in the home using a standard weight wheelchair due to endurance and general weakness. Patient can self propel in the lightweight wheelchair. Length of need Lifetime. Accessories: elevating leg rests (ELRs), wheel locks, extensions and anti-tippers.   09/16/19 1128

## 2019-09-16 NOTE — Discharge Instructions (Signed)
Thank you for allowing [place] to take part in your care.   You were admitted to the hospital for confusion due to a stroke.   While admitted to the hospital you were evaluated by neurologists who reccommended that you start taking Aspirin 325 mg daily, Atorvastatin 80mg  daily and Plavix 75 mg daily.

## 2019-09-16 NOTE — Progress Notes (Signed)
FPTS Interim Progress Note Received page that patient is daughter had changed her mind about having her mother discharged into her care after watching her mother have difficulty with ambulation.  Patient's daughter decided that she would rather her mother go to a skilled nursing facility rather than be discharged home with home health PT.  Patient will remain admitted and under the care family medicine teaching service.  Stark Klein, MD 09/16/2019, 6:48 PM PGY-1, [place] Service pager [phone]

## 2019-09-16 NOTE — Evaluation (Signed)
Speech Language Pathology Evaluation Patient Details Name: Christina Evans MRN: [id number] DOB: 10/13/1961 Today's Date: 09/16/2019 Time: 5852-7782 SLP Time Calculation (min) (ACUTE ONLY): 24 min  Problem List:  Patient Active Problem List   Diagnosis Date Noted  . Palliative care by specialist   . Goals of care, counseling/discussion   . Encephalopathy   . Acute CVA (cerebrovascular accident) ([place]) 09/14/2019  . CVA (cerebral vascular accident) ([place]) 09/14/2019  . Severe comorbid illness   . AMS (altered mental status) 09/13/2019  . Insulin dependent diabetes mellitus 09/02/2017  . Hyperlipidemia 09/02/2017  . Hypertension 09/02/2017  . Chest pain 09/02/2017   Past Medical History:  Past Medical History:  Diagnosis Date  . Asthma   . CVA (cerebral vascular accident) ([place])   . Diabetes mellitus without complication [facility])    Past Surgical History:  Past Surgical History:  Procedure Laterality Date  . NO PAST SURGERIES     HPI:  58 y.o. female presenting with altered mental status. PMH is significant for history of stroke, insulin-dependent diabetes, hyperlipidemia, hypertension 09/13/19 MRI brain revealed Moderate-sized acute to early subacute left ACA territory infarct. No associated hemorrhage or mass effect. 2. Large chronic right MCA territory infarct. 3. Underlying age-related cerebral atrophy.  Assessment / Plan / Recommendation Clinical Impression   Partial SLE completed with daughter's assistance for pmhx/new deficits, but pt is bilingual and communicated primarily via English effectively; determined interpreter need not necessary per daughter going forward, although pt apparently communicated in [place] when first admitted; pt able to follow 1-2 step directives consistently, but only 20% with max cues for multi-step familiar tasks; intelligible at word-phrase level, but pt intermittently lethargic during evaluation; oriented to name, date and place, but not  situation; pt exhibited naming deficits with confabulations, perseverations and semantic paraphasias present within simple conversational tasks; daughter stated speech deficits are new and not related to prior CVA; memory deficits noted with word recall, new information recall and STM tasks.  Cognitive deficits noted with awareness, attention and reasoning/problem solving skills with simple tasks.  ST recommended to address noted deficits while in acute setting until next level of care determined.  Thank you for this consult.    SLP Assessment  SLP Recommendation/Assessment: Patient needs continued Speech Language Pathology Services SLP Visit Diagnosis: Cognitive communication deficit (R41.841);Aphasia (R47.01)    Follow Up Recommendations  24 hour supervision/assistance;Home health SLP;Other (comment)(Family prefers home health)    Frequency and Duration min 2x/week  1 week      SLP Evaluation Cognition  Overall Cognitive Status: Impaired/Different from baseline Arousal/Alertness: Lethargic Orientation Level: Oriented to person;Oriented to place;Oriented to time;Disoriented to situation Attention: Sustained Sustained Attention: Impaired Sustained Attention Impairment: Verbal basic;Functional basic Memory: Impaired Memory Impairment: Retrieval deficit;Decreased recall of new information;Decreased short term memory Decreased Short Term Memory: Verbal basic;Functional basic Memory Recall Sock: With Cue Memory Recall Blue: With Cue Memory Recall Bed: With Cue Awareness: Impaired Awareness Impairment: Intellectual impairment Problem Solving: Impaired Problem Solving Impairment: Verbal basic;Functional basic Behaviors: Restless;Perseveration;Confabulation Safety/Judgment: Impaired       Comprehension  Auditory Comprehension Commands: Impaired Multistep Basic Commands: 25-49% accurate Conversation: Simple Interfering Components: Attention;Processing speed;Working  Field seismologist: Consulting civil engineer Discrimination: Not tested Reading Comprehension Reading Status: Not tested    Expression Expression Primary Mode of Expression: Verbal Verbal Expression Overall Verbal Expression: Impaired Level of Generative/Spontaneous Verbalization: Phrase Repetition: Impaired Level of Impairment: Phrase level Naming: Impairment Responsive: 26-50% accurate Confrontation: Impaired Convergent: 25-49% accurate Divergent: 25-49% accurate  Other Naming Comments: (perseverations, confabulations, semantic paraphasias) Verbal Errors: Semantic paraphasias;Confabulation;Perseveration;Not aware of errors Interfering Components: Premorbid deficit Non-Verbal Means of Communication: Not applicable Written Expression Dominant Hand: Right Written Expression: Not tested   Oral / Motor  Oral Motor/Sensory Function Overall Oral Motor/Sensory Function: Mild impairment Facial ROM: Reduced left Facial Symmetry: Abnormal symmetry left Facial Strength: Reduced right;Reduced left Lingual ROM: Reduced right;Reduced left;Other (Comment)(Pt stated "unable to move tongue") Lingual Symmetry: Abnormal symmetry left;Other (Comment)(slight) Lingual Strength: Reduced Motor Speech Overall Motor Speech: Other (comment)(DTA) Respiration: Within functional limits Phonation: Normal Resonance: Within functional limits Articulation: Within functional limitis Intelligibility: Intelligible Motor Planning: Witnin functional limits Motor Speech Errors: Not applicable Interfering Components: Premorbid status                       Tressie Stalker, M.S., CCC-SLP 09/16/2019, 11:22 AM

## 2019-09-16 NOTE — Progress Notes (Signed)
Family Medicine Teaching Service Daily Progress Note Intern Pager: [phone]  Patient name: Christina Evans Medical record number: [id number] Date of birth: Feb 02, 1961 Age: 58 y.o. 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  Assessment and Plan: Christina Evans is a 58 y.o. female presenting with altered mental status with CVA. PMH is significant for history of stroke, insulin-dependent diabetes, hyperlipidemia, hypertension.  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 Neurology has signed off with plan for follow up in stroke clinic in 4 weeks.  Neg cardiolipin antibodies, homocysteine WNL, lupus anticoagulant negative. On exam, patient with residual left upper and lower extremity weakness with 3/5 strength, 5/5 strength on the right upper and lower extremities. Patient with intact sensation bilaterally and oriented to self, year, month, location, city and state. Patient speaks with clear speech.   -Continue aspirin 325 daily, will continue at discharge  -Continue atorvastatin 80 mg, will continue at discharge  -Neurology (signed off), recommend continuing ASA and Plavix 75mg    Continue fish oil and Atorvastatin 80mg  at discharge   F/u in stroke clinic in 4 weeks  -PT/OT - rec SNF but family wants to take home with home health  -Palliative care consulted, family wants patient home with home health services  -orders   Type 2  diabetes, insulin-dependent: Patient on lantus 50 units twice daily and 15 units with meals at home.  Patient is also noted to be on 1000 mg twice daily Metformin. Patient received 20 units lantus and 16 with sliding scale in last 24 hours. Blood glucose ranges overnight 186-254. A1c 9.8 this admission. - continue moderate sliding scale insulin  -Continue to monitor CBGs  -continue Lantus 20 units daily  Hypertension Patient with blood pressures within normal ranges overnight except for one elevated 144/82, the majority WNL. Patient home medications include Lopressor 12.5 mg twice daily and valsartan-hydrochlorothiazide 160-12.5 mg daily.    -Continue to monitor blood pressure, goal 130-150 -Continue home HCTZ 12.5 mg and irbesartan 150 while admitted -continue Lopressor 12.5mg  BI D  Left foot fifth metatarsal fracture with osteopenia Patient found to have a fifth metatarsal fracture on left foot x-ray. Ortho consulted and recommended outpatient f/u with Dr. , 50% WB as tolerated   History of asthma Home medication include albuterol 2 puffs 4 times daily as needed -Continue albuterol as needed  FEN/GI: Heart healthy carb modified diet  PPx: Lovenox 40  Disposition: discharge expected today with home health  Subjective:  Patient reports no pain and that her foot is no longer bothering her.  Objective: Temp:  [98 F (36.7 C)-98.6 F (37 C)] 98 F (36.7 C) (10/24 0856) Pulse Rate:  [82-98] 87 (10/24 0856) Resp:  [10-19] 14 (10/24 0856) BP: (111-144)/(58-84) 142/73 (10/24 0856) SpO2:  [94 %-100 %] 97 % (10/24 0856)  Physical Exam: General: female appearing stated age in NAD, lying in bed watching TV  Cardiovascular: RRR without murmurs or gallops  Respiratory: CTAB without wheezing or crackles  Abdomen: soft,  NT, bowel sounds present  MSK: no LE edema, left foot with nor ecchymosis, continues to have hyperpigmented skin on dorsum of foot  Neuro: patient with residual  left upper and lower extremity weakness with 3/5 strength, 5/5 strength on the right upper and lower extremities. Patient with intact sensation bilaterally and oriented to self, year, month, location, city and state. Patient speaks with clear speech.     Laboratory: Recent Labs  Lab 09/13/19 1257 09/13/19 1339 09/14/19 0227 09/15/19 0643  WBC 9.5  --  10.3 8.6  HGB 14.5 15.0 13.1 14.2  HCT 44.8 44.0 39.7 42.6  PLT 370  --  331 332   Recent Labs  Lab 09/13/19 1257  09/14/19 0227 09/15/19 0643 09/16/19 0227  NA 142   < > 138 136 136  K 3.8   < > 3.6 3.5 3.7  CL 104  --  104 102 101  CO2 26  --  24 23 24   BUN 31*  --  29* 12 14  CREATININE 0.55  --  0.60 0.46 0.48  CALCIUM 9.5  --  8.7* 8.7* 8.7*  PROT 8.0  --   --   --   --   BILITOT 0.7  --   --   --   --   ALKPHOS 78  --   --   --   --   ALT 27  --   --   --   --   AST 19  --   --   --   --   GLUCOSE 255*  --  296* 220* 192*   < > = values in this interval not displayed.   Imaging/Diagnostic Tests: No results found. Stark Klein, MD 09/16/2019, 11:04 AM PGY-3, [place] Intern pager: [phone], text pages welcome

## 2019-09-16 NOTE — Care Management (Signed)
Notified by MD that patient's daughter has now changed her mind and wants SNF. Baxter Flattery, [place] notified.

## 2019-09-17 DIAGNOSIS — E118 Type 2 diabetes mellitus with unspecified complications: Secondary | ICD-10-CM

## 2019-09-17 DIAGNOSIS — G9340 Encephalopathy, unspecified: Secondary | ICD-10-CM | POA: Diagnosis not present

## 2019-09-17 LAB — BASIC METABOLIC PANEL
Anion gap: 8 (ref 5–15)
BUN: 14 mg/dL (ref 6–20)
CO2: 25 mmol/L (ref 22–32)
Calcium: 8.6 mg/dL — ABNORMAL LOW (ref 8.9–10.3)
Chloride: 105 mmol/L (ref 98–111)
Creatinine, Ser: 0.56 mg/dL (ref 0.44–1.00)
GFR calc Af Amer: >60 mL/min (ref >60)
GFR calc non Af Amer: >60 mL/min (ref >60)
Glucose, Bld: 245 mg/dL — ABNORMAL HIGH (ref 70–99)
Potassium: 3.6 mmol/L (ref 3.5–5.1)
Sodium: 138 mmol/L (ref 135–145)

## 2019-09-17 LAB — GLUCOSE, CAPILLARY
Glucose-Capillary: 206 mg/dL — ABNORMAL HIGH (ref 70–99)
Glucose-Capillary: 367 mg/dL — ABNORMAL HIGH (ref 70–99)
Glucose-Capillary: 192 mg/dL — ABNORMAL HIGH (ref 70–99)
Glucose-Capillary: 154 mg/dL — ABNORMAL HIGH (ref 70–99)

## 2019-09-17 LAB — SARS CORONAVIRUS 2 (TAT 6-24 HRS): SARS Coronavirus 2: NEGATIVE

## 2019-09-17 MED ORDER — INSULIN GLARGINE 100 UNIT/ML ~~LOC~~ SOLN
25.0000 [IU] | Freq: Every day | SUBCUTANEOUS | Status: DC
  Administered 2019-09-17 – 2019-09-18 (×2): 25 [IU] via SUBCUTANEOUS
  Filled 2019-09-17 (×2): qty 0.25

## 2019-09-17 MED ORDER — IRBESARTAN 300 MG PO TABS
150.0000 mg | ORAL_TABLET | Freq: Every day | ORAL | Status: DC
  Administered 2019-09-17 – 2019-09-19 (×3): 150 mg via ORAL
  Filled 2019-09-17 (×3): qty 1

## 2019-09-17 NOTE — Progress Notes (Signed)
Family Medicine Teaching Service Daily Progress Note Intern Pager: [phone]  Patient name: Christina Evans Medical record number: [id number] Date of birth: 06-02-1961 Age: 58 y.o. 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 10/24 - changed mind, wants snf  Assessment and Plan: Christina Evans a 58 y.o.femalepresenting with altered mental status with CVA, now awaiting SNF placement. PMH is significant forhistory of stroke, insulin-dependent diabetes, hyperlipidemia, hypertension.  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 Currently awaiting SNF placement - ASA 325 daily - plavix daily - atorvastatin 80mg  daily - make sure PT/OT still aware pt is in hospital for recurring treatment.   Type 2 diabetes, insulin-dependent: Patient on lantus 50 units twice daily and 15 units with meals at home.  Patient is also noted to be on 1000 mg twice daily Metformin. Patient received 20 units lantus and 11 U novolog SSI in the past 24 hrs. Blood glucose ranges overnight 1~200-250. A1c 9.8 this admission. - continue moderate sliding scale insulin  -Continue to monitor CBGs  -increased lantus to 25 U qhs.    Hypertension Patient with blood pressures within normal ranges overnight except for one elevated 153/75. Goal bp bw 130-150 Patient home medications include Lopressor 12.5 mg twice daily and valsartan-hydrochlorothiazide 160-12.5 mg daily.    -Continue to monitor blood pressure, goal 130-150 - restarted irbesartan 150mg  - hold hctz -continue Lopressor 12.5mg  BI D  Left foot fifth  metatarsal fracture with osteopenia Patient found to have a fifth metatarsal fracture on left foot x-ray. Ortho consulted and recommended outpatient f/u with Dr. , 50% WB as tolerated   History of asthma Home medication include albuterol 2 puffs 4 times daily as needed -Continue albuterol as needed  FEN/GI: Heart healthy carb modified diet PPx: lovenox  Disposition: medically cleared for SNF  Subjective:  No complaints. No questions. Denies HA, chest pain, abdominal pain.   Objective: Temp:  [97.9 F (36.6 C)-98 F (36.7 C)] 97.9 F (36.6 C) (10/24 2134) Pulse Rate:  [86-99] 99 (10/24 2134) Resp:  [14-18] 14 (10/24 2134) BP: (118-142)/(69-97) 141/69 (10/24 2134) SpO2:  [92 %-97 %] 96 % (10/24 2134) Physical Exam: General: alert and oriented. No acute distress.  Cardiovascular: RRR. No murmurs.  Respiratory: LCTAB.  No wheezes Abdomen: soft, TTP suprapubic.  Extremities: 4/5 strength b/l in upper and lower extremities  Laboratory: Recent Labs  Lab 09/13/19 1257 09/13/19 1339 09/14/19 0227 09/15/19 0643  WBC 9.5  --  10.3 8.6  HGB 14.5 15.0 13.1 14.2  HCT 44.8 44.0 39.7 42.6  PLT 370  --  331 332   Recent Labs  Lab 09/13/19 1257  09/15/19 0643 09/16/19 0227 09/17/19 0255  NA 142   < > 136 136 138  K 3.8   < > 3.5 3.7 3.6  CL 104   < > 102 101 105  CO2 26   < > 23 24 25   BUN 31*   < > 12 14 14   CREATININE 0.55   < > 0.46 0.48 0.56  CALCIUM 9.5   < > 8.7* 8.7* 8.6*  PROT 8.0  --   --   --   --  BILITOT 0.7  --   --   --   --   ALKPHOS 78  --   --   --   --   ALT 27  --   --   --   --   AST 19  --   --   --   --   GLUCOSE 255*   < > 220* 192* 245*   < > = values in this interval not displayed.    Benay Pike, MD 09/17/2019, 5:47 AM PGY-2, [place] Intern pager: [phone], text pages welcome

## 2019-09-17 NOTE — TOC Initial Note (Addendum)
Transition of Care [facility]) - Initial/Assessment Note    Patient Details  Name: Christina Evans MRN: [id number] Date of Birth: 04/01/1961  Transition of Care [facility]) CM/SW Contact:    Gildardo Griffes, [place] Phone Number: [phone] 09/17/2019, 9:10 AM  Clinical Narrative:                  Update: Christina Evans called back to report [facility] the facility she prefers. CSW informed her that this is an ALF not a SNF and that patient needs PT/OT services. She requests referrals be sent out to [facility] SNFs and for CSW to inform her of bed offers to choose from once identified. CSW has sent referrals. Pending bed offers at this time. Insurance authorization will be started Monday 10/26 as CSW has confirmed [facility] insurance office is closed today.   CSW consulted with patient's daughter [name] regarding Skilled Nursing Facilities, she reports preference for a facility near their home however she does not remember the name of it. She reports she will look up the facility and call CSW back with the name for referral to be sent. Christina Evans in agreement with referrals to be sent to surrounding SNFs for potential bed offers. Once bed offers received CSW will inform Christina Evans of SNF options.   CSW explained insurance authorization process and informed Christina Evans that [employer] will have to complete insurance authorization prior to patient going to SNF and this typically cannot be initiated on weekend. She expressed understanding.   Expected Discharge Plan: Skilled Nursing Facility Barriers to Discharge: Insurance Authorization   Patient Goals and CMS Choice Patient states their goals for this hospitalization and ongoing recovery are:: Get stronger CMS Medicare.gov Compare Post Acute Care list provided to:: Patient Represenative (must comment)(daughter Christina Evans) Choice offered to / list presented to : Adult Children  Expected Discharge Plan and Services Expected Discharge Plan: Skilled Nursing  Facility In-house Referral: Clinical Social Work Discharge Planning Services: CM Consult, Follow-up appt scheduled Post Acute Care Choice: Skilled Nursing Facility Living arrangements for the past 2 months: Single Family Home Expected Discharge Date: 09/16/19               DME Arranged: Hospital bed DME Agency: [employer] Date DME Agency Contacted: 09/16/19 Time DME Agency Contacted: 1328 Representative spoke with at DME Agency: [place], bed and 3/1 to be delivered to the house, RW and WC o be delivered to the hospital room HH Arranged: RN, PT, OT, Nurse's Aide HH Agency: [facility] Date [facility] Agency Contacted: 09/16/19 Time HH Agency Contacted: 1330 Representative spoke with at [facility] Agency: Kandee Keen  Prior Living Arrangements/Services Living arrangements for the past 2 months: Single Family Home Lives with:: Self Patient language and need for interpreter reviewed:: Yes Do you feel safe going back to the place where you live?: No   needs short term rehab  Need for Family Participation in Patient Care: Yes (Comment) Care giver support system in place?: Yes (comment)   Criminal Activity/Legal Involvement Pertinent to Current Situation/Hospitalization: No - Comment as needed  Activities of Daily Living Home Assistive Devices/Equipment: None ADL Screening (condition at time of admission) Patient's cognitive ability adequate to safely complete daily activities?: No Is the patient deaf or have difficulty hearing?: No Does the patient have difficulty seeing, even when wearing glasses/contacts?: No Does the patient have difficulty concentrating, remembering, or making decisions?: Yes Patient able to express need for assistance with ADLs?: Yes Does the patient have difficulty dressing or bathing?: Yes Independently performs ADLs?:  Yes (appropriate for developmental age) Does the patient have difficulty walking or climbing stairs?: Yes Weakness of Legs: Both Weakness of Arms/Hands:  Both  Permission Sought/Granted Permission sought to share information with : Case Manager, Customer service manager, Family Supports Permission granted to share information with : Yes, Verbal Permission Granted  Share Information with NAME: Christina Evans  Permission granted to share info w AGENCY: SNFs  Permission granted to share info w Relationship: daughter  Permission granted to share info w Contact Information: [phone]  Emotional Assessment Appearance:: Appears stated age Attitude/Demeanor/Rapport: Unable to Assess Affect (typically observed): Unable to Assess Orientation: : Oriented to Self, Oriented to Place, Oriented to  Time Alcohol / Substance Use: Not Applicable Psych Involvement: No (comment)  Admission diagnosis:  Encephalopathy [G93.40] Severe comorbid illness [R69] Pain after cerebrovascular accident (CVA) [N36.144, R52] AMS (altered mental status) [R41.82] Patient Active Problem List   Diagnosis Date Noted  . Palliative care by specialist   . Goals of care, counseling/discussion   . Encephalopathy   . Acute CVA (cerebrovascular accident) ([place]) 09/14/2019  . CVA (cerebral vascular accident) ([place]) 09/14/2019  . Severe comorbid illness   . AMS (altered mental status) 09/13/2019  . Controlled diabetes mellitus type 2 with complications ([place]) 31/54/0086  . Hyperlipidemia 09/02/2017  . Hypertension 09/02/2017  . Chest pain 09/02/2017   PCP:  Patient, No Pcp Per Pharmacy:   [facility] [address] [place] [address] Phone: [phone] Fax: [place]), [place] - [address] [place] ([place])  [address] Phone: [phone] Fax: [phone]     Social Determinants of Health (SDOH) Interventions    Readmission Risk Interventions No flowsheet data found.

## 2019-09-17 NOTE — NC FL2 (Signed)
[place] LEVEL OF CARE SCREENING TOOL     IDENTIFICATION  Patient Name: Christina Evans Birthdate: [birth year] Sex: female Admission Date (Current Location): 09/13/2019  [facility] and [place] Number:  Herbalist and Address:  [facility] [place] [address], [place], [place] [address]      Provider Number: [id number]  Attending Physician Name and Address:  Zenia Resides, MD  Relative Name and Phone Number:  Dominga Ferry (daughter) [phone]    Current Level of Care: Hospital Recommended Level of Care: [place] Prior Approval Number:    Date Approved/Denied:   PASRR Number: [id number]  Discharge Plan: SNF    Current Diagnoses: Patient Active Problem List   Diagnosis Date Noted  . Palliative care by specialist   . Goals of care, counseling/discussion   . Encephalopathy   . Acute CVA (cerebrovascular accident) ([place]) 09/14/2019  . CVA (cerebral vascular accident) ([place]) 09/14/2019  . Severe comorbid illness   . AMS (altered mental status) 09/13/2019  . Controlled diabetes mellitus type 2 with complications ([place]) 84/13/2440  . Hyperlipidemia 09/02/2017  . Hypertension 09/02/2017  . Chest pain 09/02/2017    Orientation RESPIRATION BLADDER Height & Weight     Self, Time, Place  Normal Incontinent, External catheter Weight: 165 lb 2 oz (74.9 kg) Height:  5' (152.4 cm)  BEHAVIORAL SYMPTOMS/MOOD NEUROLOGICAL BOWEL NUTRITION STATUS      Continent Diet(see discharge summary)  AMBULATORY STATUS COMMUNICATION OF NEEDS Skin   Extensive Assist Verbally Normal                       Personal Care Assistance Level of Assistance  Bathing, Feeding, Dressing, Total care Bathing Assistance: Limited assistance Feeding assistance: Limited assistance Dressing Assistance: Maximum assistance Total Care Assistance: Maximum assistance   Functional Limitations Info  Sight, Speech, Hearing Sight Info:  Adequate Hearing Info: Adequate Speech Info: Adequate    SPECIAL CARE FACTORS FREQUENCY  PT (By licensed PT), OT (By licensed OT)     PT Frequency: min 5x weekly OT Frequency: min 5x weekly            Contractures Contractures Info: Not present    Additional Factors Info  Code Status, Allergies Code Status Info: full Allergies Info: tape           Current Medications (09/17/2019):  This is the current hospital active medication list Current Facility-Administered Medications  Medication Dose Route Frequency Provider Last Rate Last Dose  . 0.9 %  sodium chloride infusion   Intravenous Continuous Rosalin Hawking, MD 50 mL/hr at 09/15/19 1723    . acetaminophen (TYLENOL) tablet 650 mg  650 mg Oral Q6H PRN Carollee Leitz, MD      . aspirin EC tablet 325 mg  325 mg Oral Daily Rosalin Hawking, MD   325 mg at 09/16/19 0848  . atorvastatin (LIPITOR) tablet 80 mg  80 mg Oral q1800 Matilde Haymaker, MD   80 mg at 09/16/19 1856  . citalopram (CELEXA) tablet 40 mg  40 mg Oral Daily Matilde Haymaker, MD   40 mg at 09/16/19 0850  . clopidogrel (PLAVIX) tablet 75 mg  75 mg Oral Daily Rosalin Hawking, MD   75 mg at 09/16/19 0849  . enoxaparin (LOVENOX) injection 40 mg  40 mg Subcutaneous Daily Ascencion Dike, PA-C   40 mg at 09/16/19 0850  . insulin aspart (novoLOG) injection 0-15 Units  0-15 Units Subcutaneous TID WC Rory Percy, DO  3 Units at 09/16/19 1700  . insulin glargine (LANTUS) injection 25 Units  25 Units Subcutaneous Daily Sandre Kitty, MD      . irbesartan Evlyn Kanner) tablet 150 mg  150 mg Oral Daily Sandre Kitty, MD      . metoprolol tartrate (LOPRESSOR) tablet 12.5 mg  12.5 mg Oral BID Ellwood Dense, DO   12.5 mg at 09/16/19 2152  . sodium chloride flush (NS) 0.9 % injection 3 mL  3 mL Intravenous Once Mirian Mo, MD         Discharge Medications: Please see discharge summary for a list of discharge medications.  Relevant Imaging Results:  Relevant Lab Results:   Additional  Information SSN: [id number]  Gildardo Griffes, LCSW

## 2019-09-18 LAB — BASIC METABOLIC PANEL
Anion gap: 9 (ref 5–15)
BUN: 11 mg/dL (ref 6–20)
CO2: 24 mmol/L (ref 22–32)
Calcium: 8.3 mg/dL — ABNORMAL LOW (ref 8.9–10.3)
Chloride: 104 mmol/L (ref 98–111)
Creatinine, Ser: 0.58 mg/dL (ref 0.44–1.00)
GFR calc Af Amer: >60 mL/min (ref >60)
GFR calc non Af Amer: >60 mL/min (ref >60)
Glucose, Bld: 157 mg/dL — ABNORMAL HIGH (ref 70–99)
Potassium: 3.4 mmol/L — ABNORMAL LOW (ref 3.5–5.1)
Sodium: 137 mmol/L (ref 135–145)

## 2019-09-18 LAB — GLUCOSE, CAPILLARY
Glucose-Capillary: 162 mg/dL — ABNORMAL HIGH (ref 70–99)
Glucose-Capillary: 214 mg/dL — ABNORMAL HIGH (ref 70–99)
Glucose-Capillary: 230 mg/dL — ABNORMAL HIGH (ref 70–99)
Glucose-Capillary: 184 mg/dL — ABNORMAL HIGH (ref 70–99)

## 2019-09-18 LAB — CULTURE, BLOOD (ROUTINE X 2)
Culture: NO GROWTH
Culture: NO GROWTH

## 2019-09-18 MED ORDER — POTASSIUM CHLORIDE CRYS ER 20 MEQ PO TBCR
30.0000 meq | EXTENDED_RELEASE_TABLET | Freq: Once | ORAL | Status: AC
  Administered 2019-09-18: 30 meq via ORAL
  Filled 2019-09-18: qty 1

## 2019-09-18 MED ORDER — INSULIN GLARGINE 100 UNIT/ML ~~LOC~~ SOLN
33.0000 [IU] | Freq: Every day | SUBCUTANEOUS | Status: DC
  Administered 2019-09-19: 33 [IU] via SUBCUTANEOUS
  Filled 2019-09-18: qty 0.33

## 2019-09-18 NOTE — Progress Notes (Signed)
Physical Therapy Treatment Patient Details Name: Christina Evans MRN: [id number] DOB: 1961-04-15 Today's Date: 09/18/2019    History of Present Illness Pt is a 58 y.o. F with significant PMH of stroke, insulin dependent diabetes, hyperlipidemia, hypertension. Presents with altered mental status. EEG 10/22 suggestive of mild diffuse encephalopathy and no seizures. MRI brain with moderate sized acute to early subacute left ACA territory infarct and large chronic right MCA territory infarct. Of note, foot x-ray showing acute minimally dispolaced fracture through base of left fifth metatarsal.      PT Comments    Pt was OOB to chair earlier today with nursing - per report easy to get up and total assist of 2 to get back.  Today pt sat EOB and did therapy sitting - I tried to stand her but she was leaning posterior and resisting.   she has tone and numbness on left side. She demonstrated some pusher qualities to right and posterior.  Daughter present - educating pt and daughter on CVA recovery, left neglect, sitting balance and tolerance.  Pt sat EOB for 25  minutes - she pushed to right for 30% of that time.  I showed daughter how to break with leaning her onto left elbow.  Pt sat with SBA 30% of the time - very inconsistent.  Pt inconsistent following directions for me - she did only 20% of the time.  I educated daughter on ways she could help pt.  I anticipate slow recovery for pt.  PT will continue to see pt while in the hospital. I agree with SNF recovery plan.   Follow Up Recommendations  SNF;Supervision/Assistance - 24 hour     Equipment Recommendations  None recommended by PT    Recommendations for Other Services       Precautions / Restrictions Precautions Precautions: Fall Precaution Comments: pt is inconsistent pusher to the right Restrictions Weight Bearing Restrictions: No    Mobility  Bed Mobility Overal bed mobility: Needs Assistance Bed Mobility: Supine to Sit;Sit to  Supine     Supine to sit: Mod assist Sit to supine: Mod assist   General bed mobility comments: pt needed alot more assist to sit up today - we rolled to left and pushed up.  not sure which side pt got up on last time but pt doesnt like to lean onto left elbow - afraid of this postiion.  denies pain  Transfers                 General transfer comment: i tried to have pt stand but she wasnt cooperative - everytime we would try - she would push right and posterior so we changed to sitting activities.  nursing tech said she got up into chiar earlier today - did well getting up and then max total lift to get back to bed as she wasnt assisting.  tday - i had her sit EOB - we practiced leaning forwad - she was resistant and pushing posterior. s he would push to right - grab onto bed or rail etc.  I could almost not hold her up - i would force her onto left elbow and have her relax - then have her sit up and she would have good sitting balance for approx 4-5 minutes befor she would start pushing again.  pt said feeling in left not good in UE or LE.  I talked to with daughter about left neglect - getting her to focus to left side - pt can turn  head and eyes to left but doesnt address as much as right.  Ambulation/Gait             General Gait Details: unable - although duaghter said pt walked around th bed a few days ago with staff   Stairs             Wheelchair Mobility    Modified Rankin (Stroke Patients Only)       Balance Overall balance assessment: Needs assistance Sitting-balance support: Feet supported Sitting balance-Leahy Scale: Fair Sitting balance - Comments: inconsistent - see transfer section                                    Cognition Arousal/Alertness: Awake/alert Behavior During Therapy: WFL for tasks assessed/performed;Flat affect Overall Cognitive Status: Impaired/Different from baseline                                  General Comments: Pt continues to be distractable.  pt would laugh at jokes.  she was inconsistent at following directions - 20% but daughter said she could understand her and talk with her.  Pt not very verbal - answered questions with one word answers inconsistently      Exercises      General Comments        Pertinent Vitals/Pain Pain Assessment: No/denies pain Pain Intervention(s): Monitored during session    Home Living                      Prior Function            PT Goals (current goals can now be found in the care plan section) Progress towards PT goals: Progressing toward goals    Frequency    Min 2X/week      PT Plan Current plan remains appropriate    Co-evaluation              AM-PAC PT "6 Clicks" Mobility   Outcome Measure  Help needed turning from your back to your side while in a flat bed without using bedrails?: A Little Help needed moving from lying on your back to sitting on the side of a flat bed without using bedrails?: A Lot Help needed moving to and from a bed to a chair (including a wheelchair)?: A Lot Help needed standing up from a chair using your arms (e.g., wheelchair or bedside chair)?: A Lot Help needed to walk in hospital room?: Total Help needed climbing 3-5 steps with a railing? : Total 6 Click Score: 11    End of Session Equipment Utilized During Treatment: Other (comment)(left post op shoe - for chronic metatarsal fracture) Activity Tolerance: Patient tolerated treatment well;Patient limited by fatigue Patient left: in bed;with call bell/phone within reach;with bed alarm set;with family/visitor present Nurse Communication: Mobility status;Precautions PT Visit Diagnosis: Muscle weakness (generalized) (M62.81);Hemiplegia and hemiparesis;Other abnormalities of gait and mobility (R26.89);Difficulty in walking, not elsewhere classified (R26.2)     Time: 1530-1610 PT Time Calculation (min) (ACUTE ONLY): 40  min  Charges:  $Therapeutic Activity: 8-22 mins $Neuromuscular Re-education: 23-37 mins                     09/18/2019   Ranae Palms, PT    Christina Evans 09/18/2019, 4:30 PM

## 2019-09-18 NOTE — Progress Notes (Signed)
Family Medicine Teaching Service Daily Progress Note Intern Pager: [phone]  Patient name: Christina Evans Medical record number: [id number] Date of birth: Dec 24, 1960 Age: 58 y.o. 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 10/24 - changed mind, wants snf  Assessment and Plan: Christina Evans a 58 y.o.femalepresenting with altered mental statuswith CVA, now awaiting SNF placement. PMH is significant forhistory of stroke, insulin-dependent diabetes, hyperlipidemia, hypertension.  Acute left ACA stroke, stable Old R MCA stroke With signs stable overnight.  On exam patient alert and orientated to person and place.  Denies any headaches, visual disturbances, chest pain or shortness of breath. -Continue ASA 325 mg daily -Continue Plavix 75 mg daily -Continue atorvastatin 80 mg daily -Neuro follow-up in stroke clinic 4 weeks -Patient currently awaiting SNF placement -PT/OT following  Type 2 diabetes, insulin-dependent: CBG overnight 154-157.  Required 16 units of extra insulin coverage.  Currently on Lantus 25 units daily -Increase Lantus 33 units daily -Continue moderate sliding scale insulin coverage -Continue to monitor CBGs.   Hypertension Normotensive overnight.  No as needed meds given overnight -Continue irbesartan 150 mg daily -Continue metoprolol 12.5 mg twice daily -Maintain systolic blood pressure between 130-150  Left foot fifth metatarsal fracture with osteopenia -Follow-up with Dr. Aundria Rud outpatient  -Weightbearing as tolerated  History of asthma Asymptomatic.  No rescue therapy given  overnight. -Continue to monitor  FEN/GI:  -Heart healthy/carb modified  PPx:  -Lovenox 40 mg daily   Disposition: SNF when bed available  Subjective:  No acute overnight events.  Denies any headaches, shortness of breath, chest pain.  Appetite good.  Patient reports no concerns.  Objective: Temp:  [98.1 F (36.7 C)-99.4 F (37.4 C)] 98.9 F (37.2 C) (10/26 0538) Pulse Rate:  [84-99] 97 (10/26 0538) Resp:  [16-18] 18 (10/25 1500) BP: (103-153)/(56-75) 133/70 (10/26 0538) SpO2:  [96 %-97 %] 96 % (10/26 0538) Weight:  [74.9 kg] 74.9 kg (10/25 9833) Physical Exam: General: 58 year old female in no acute distress Cardiovascular: Regular rate and rhythm, no murmurs appreciated Respiratory: Chest clear to auscultation bilaterally, no crackles, no rhonchi, no increased work of breathing Abdomen: Soft, nontender, nondistended, bowel sounds present Extremities: Moves all extremities, left sided weakness.  Laboratory: Recent Labs  Lab 09/13/19 1257 09/13/19 1339 09/14/19 0227 09/15/19 0643  WBC 9.5  --  10.3 8.6  HGB 14.5 15.0 13.1 14.2  HCT 44.8 44.0 39.7 42.6  PLT 370  --  331 332   Recent Labs  Lab 09/13/19 1257  09/16/19 0227 09/17/19 0255 09/18/19 0326  NA 142   < > 136 138 137  K 3.8   < > 3.7 3.6 3.4*  CL 104   < > 101 105 104  CO2 26   < > 24 25 24   BUN 31*   < > 14 14 11   CREATININE 0.55   < > 0.48 0.56 0.58  CALCIUM 9.5   < > 8.7* 8.6* 8.3*  PROT 8.0  --   --   --   --   BILITOT 0.7  --   --   --   --   ALKPHOS 78  --   --   --   --  ALT 27  --   --   --   --   AST 19  --   --   --   --   GLUCOSE 255*   < > 192* 245* 157*   < > = values in this interval not displayed.      Imaging/Diagnostic Tests:  Carollee Leitz, MD 09/18/2019, 6:09 AM PGY-1, [place] Intern pager: [phone], text pages welcome

## 2019-09-18 NOTE — TOC Progression Note (Signed)
Transition of Care [facility]) - Progression Note    Patient Details  Name: Christina Evans MRN: [id number] Date of Birth: 1961-01-03  Transition of Care [facility]) CM/SW [place], LCSW Phone Number: 09/18/2019, 9:09 AM  Clinical Narrative:    CSW can begin insurance authorization once PT works with patient today.    Expected Discharge Plan: Skilled Nursing Facility Barriers to Discharge: Insurance Authorization  Expected Discharge Plan and Services Expected Discharge Plan: [place] In-house Referral: Clinical Social Work Discharge Planning Services: CM Consult, Follow-up appt scheduled Post Acute Care Choice: [place] arrangements for the past 2 months: Single Family Home Expected Discharge Date: 09/16/19               DME Arranged: Hospital bed DME Agency: [employer] Date DME Agency Contacted: 09/16/19 Time DME Agency Contacted: [phone] Representative spoke with at DME Agency: [place], bed and 3/1 to be delivered to the house, RW and [place] o be delivered to the hospital room [place] Arranged: RN, PT, OT, Nurse's Aide [place] Agency: [place] Date [place]: 09/16/19 Time [place]: 1330 Representative spoke with at [place]: [place] ([place]) Interventions    Readmission Risk Interventions No flowsheet data found.

## 2019-09-18 NOTE — Care Management Important Message (Signed)
Important Message  Patient Details  Name: Christina Evans MRN: [id number] Date of Birth: February 05, 1961   Medicare Important Message Given:  Yes     Jatniel Verastegui 09/18/2019, 4:08 PM

## 2019-09-18 NOTE — Progress Notes (Signed)
Family Medicine Teaching Service Daily Progress Note Intern Pager: [phone]  Patient name: Christina Evans Medical record number: [id number] Date of birth: 10/16/1961 Age: 58 y.o. 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 10/24 - changed mind, wants snf  Assessment and Plan: Christina Evans a 58 y.o.femalepresenting with altered mental statuswith CVA, now awaiting SNF placement. PMH is significant forhistory of stroke, insulin-dependent diabetes, hyperlipidemia, hypertension.  Acute left ACA stroke, stable Old R MCA stroke Vital signs stable overnight.  On exam, patient is alert and oriented to person only. Patient demonstrates difficulty with word finding this AM.  Denies any headaches, visual disturbances, chest pain or shortness of breath. Patient continues to have left foot pain with palpation and left upper and lower extremity weakness 3/5 strength as compared to 4/5 strength on right.  -Continue ASA 325 mg daily -Continue Plavix 75 mg daily -Continue atorvastatin 80 mg daily -Neuro follow-up in stroke clinic 4 weeks -Patient currently awaiting SNF placement -PT/OT following  Type 2 diabetes, insulin-dependent: CBG overnight 184-230.  Required 13 units of short acting insulin coverage.  Patient scheduled to start on Lantus 33 units daily starting today.  -Continue moderate sliding scale insulin coverage -Continue to monitor CBGs.   Hypertension, normotensive Normotensive overnight.  No as needed meds given overnight -Continue irbesartan 150 mg daily -discontinued metoprolol 10/26 -Maintain systolic blood  pressure between 130-150  Left foot fifth metatarsal fracture with osteopenia, stable -Patient scheduled to follow-up with Dr. Aundria Rud as outpatient  -50% Weightbearing as tolerated  History of asthma Asymptomatic.  No rescue therapy required overnight. -Continue to monitor  FEN/GI: Heart healthy/carb modified   PPx: Lovenox 40 mg daily   Disposition: SNF when bed available  Subjective:   Patient reports feeling well, denies headache or blurry vision. Patient denies pain in left foot but withdraws 2/2 to pain when her foot is manipulated. Patient oriented to self but has difficulty with word finding when asked location stating that she is in a "[place] where they treat".   Objective: Temp:  [98.6 F (37 C)-99.3 F (37.4 C)] 98.6 F (37 C) (10/27 0600) Pulse Rate:  [87-93] 93 (10/27 0600) Resp:  [17-18] 18 (10/27 0600) BP: (107-127)/(61-80) 123/61 (10/27 0600) SpO2:  [96 %-98 %] 96 % (10/27 0600) Weight:  [78.6 kg] 78.6 kg (10/27 0600)   Physical Exam: General: female in NAD lying in bed quietly Cardiovascular: RRR without murmurs, gallops or friction rubs  Respiratory: CTAB without wheezing or crackles   Abdomen: soft, NT, bowel sounds present throughout  Extremities: tenderness to palpation of left foot with no erythema or edema present   Laboratory: Recent Labs  Lab 09/13/19 1257 09/13/19 1339 09/14/19 0227 09/15/19 0643  WBC 9.5  --  10.3 8.6  HGB 14.5 15.0 13.1 14.2  HCT 44.8 44.0 39.7 42.6  PLT 370  --  331 332   Recent Labs  Lab 09/13/19 1257  09/17/19 0255 09/18/19 0326 09/19/19 0329  NA 142   < > 138 137 137  K 3.8   < > 3.6 3.4* 3.7  CL 104   < > 105 104 106  CO2 26   < > 25  24 24  BUN 31*   < > 14 11 9   CREATININE 0.55   < > 0.56 0.58 0.58  CALCIUM 9.5   < > 8.6* 8.3* 8.6*  PROT 8.0  --   --   --   --   BILITOT 0.7  --   --   --   --   ALKPHOS 78  --   --   --   --   ALT 27  --   --   --   --   AST 19  --   --   --   --    GLUCOSE 255*   < > 245* 157* 187*   < > = values in this interval not displayed.    Imaging/Diagnostic Tests:  Stark Klein, MD 09/19/2019, 6:15 AM PGY-1, [place] Intern pager: [phone], text pages welcome

## 2019-09-18 NOTE — Progress Notes (Signed)
Inpatient Diabetes Program Recommendations  AACE/ADA: New Consensus Statement on Inpatient Glycemic Control (2015)  Target Ranges:  Prepandial:   less than 140 mg/dL      Peak postprandial:   less than 180 mg/dL (1-2 hours)      Critically ill patients:  140 - 180 mg/dL   Results for Christina Evans, Christina Evans (MRN [id number]) as of 09/18/2019 13:12  Ref. Range 09/17/2019 07:55 09/17/2019 12:15 09/17/2019 17:03 09/17/2019 21:51  Glucose-Capillary Latest Ref Range: 70 - 99 mg/dL 206 (H)  5 units NOVOLOG  367 (H)  8 units NOVOLOG +  25 units LANTUS  192 (H)  3 units NOVOLOG  154 (H)   Results for Christina Evans, Christina Evans (MRN [id number]) as of 09/18/2019 13:12  Ref. Range 09/18/2019 08:15 09/18/2019 12:03  Glucose-Capillary Latest Ref Range: 70 - 99 mg/dL 162 (H)  3 units NOVOLOG +  25 units LANTUS 214 (H)  5 units NOVOLOG     Home DM Meds: Lantus 50 units BID       Novolog 15 units TID       Metformin 1000 mg BID  Current Orders: Lantus 33 units Daily     Novolog Moderate Correction Scale/ SSI (0-15 units) TID AC     MD- Note Lantus increased to 33 units Daily today.  Will get increased dose tomorrow AM (10/27)  Having issues with post-meal CBG elevations.  May consider starting low dose Novolog Meal Coverage as well:  Novolog 5 units TID with meals (1/3 total home dose Novolog)  (Please add the following Hold Parameters: Hold if pt eats <50% of meal, Hold if pt NPO)       --Will follow patient during hospitalization--  Wyn Quaker RN, MSN, CDE Diabetes Coordinator Inpatient Glycemic Control Team Team Pager: [phone] (8a-5p)

## 2019-09-19 ENCOUNTER — Encounter (HOSPITAL_COMMUNITY): Payer: Self-pay | Admitting: Interventional Radiology

## 2019-09-19 LAB — BASIC METABOLIC PANEL
Anion gap: 7 (ref 5–15)
BUN: 9 mg/dL (ref 6–20)
CO2: 24 mmol/L (ref 22–32)
Calcium: 8.6 mg/dL — ABNORMAL LOW (ref 8.9–10.3)
Chloride: 106 mmol/L (ref 98–111)
Creatinine, Ser: 0.58 mg/dL (ref 0.44–1.00)
GFR calc Af Amer: >60 mL/min (ref >60)
GFR calc non Af Amer: >60 mL/min (ref >60)
Glucose, Bld: 187 mg/dL — ABNORMAL HIGH (ref 70–99)
Potassium: 3.7 mmol/L (ref 3.5–5.1)
Sodium: 137 mmol/L (ref 135–145)

## 2019-09-19 LAB — GLUCOSE, CAPILLARY
Glucose-Capillary: 226 mg/dL — ABNORMAL HIGH (ref 70–99)
Glucose-Capillary: 182 mg/dL — ABNORMAL HIGH (ref 70–99)
Glucose-Capillary: 225 mg/dL — ABNORMAL HIGH (ref 70–99)
Glucose-Capillary: 204 mg/dL — ABNORMAL HIGH (ref 70–99)

## 2019-09-19 LAB — ANTINUCLEAR ANTIBODIES, IFA: ANA Ab, IFA: NEGATIVE

## 2019-09-19 MED ORDER — ASPIRIN 325 MG PO TBEC: 325.0000 mg | DELAYED_RELEASE_TABLET | Freq: Every day | ORAL | 0 refills | Status: DC

## 2019-09-19 MED ORDER — IRBESARTAN 150 MG PO TABS: 150.0000 mg | ORAL_TABLET | Freq: Every day | ORAL | 0 refills | Status: DC

## 2019-09-19 MED ORDER — LANTUS SOLOSTAR 100 UNIT/ML ~~LOC~~ SOPN: 33.0000 [IU] | PEN_INJECTOR | Freq: Every day | SUBCUTANEOUS | 11 refills | Status: DC

## 2019-09-19 MED ORDER — CLOPIDOGREL BISULFATE 75 MG PO TABS: 75.0000 mg | ORAL_TABLET | Freq: Every day | ORAL | 0 refills | Status: DC

## 2019-09-19 NOTE — Progress Notes (Signed)
Pt waiting for PTAR to transfer to SNF. Report called to receiving facility.

## 2019-09-19 NOTE — Progress Notes (Signed)
PTAR to transferred pt to SNF. IV and tele monitor removed

## 2019-09-19 NOTE — TOC Progression Note (Signed)
Transition of Care [facility]) - Progression Note    Patient Details  Name: Christina Evans MRN: [id number] Date of Birth: [birth year]  Transition of Care [facility]) CM/SW [place], LCSW Phone Number: 09/19/2019, 2:09 PM  Clinical Narrative:    CSW spoke with patient's daughter to get her SNF bed choice. She is in agreement to proceed with [facility]. CSW alerted insurance.  1pm: Insurance authorization received: [id number]. [place] completing paperwork with patient's daughter.    Expected Discharge Plan: Skilled Nursing Facility Barriers to Discharge: Insurance Authorization  Expected Discharge Plan and Services Expected Discharge Plan: [place] In-house Referral: Clinical Social Work Discharge Planning Services: CM Consult, Follow-up appt scheduled Post Acute Care Choice: [place] arrangements for the past 2 months: Single Family Home Expected Discharge Date: 09/19/19               DME Arranged: Hospital bed DME Agency: [employer] Date DME Agency Contacted: 09/16/19 Time DME Agency Contacted: [phone] Representative spoke with at DME Agency: [place], bed and 3/1 to be delivered to the house, RW and [place] o be delivered to the hospital room [place] Arranged: RN, PT, OT, Nurse's Aide [place] Agency: [place] Date [place]: 09/16/19 Time [place]: 1330 Representative spoke with at [place]: [place] ([place]) Interventions    Readmission Risk Interventions No flowsheet data found.

## 2019-09-19 NOTE — TOC Transition Note (Signed)
Transition of Care [facility]) - CM/SW Discharge Note   Patient Details  Name: Christina Evans MRN: [id number] Date of Birth: 11/13/1961  Transition of Care [facility]) CM/SW Contact:  Benard Halsted, LCSW Phone Number: 09/19/2019, 2:17 PM   Clinical Narrative:    Patient will DC to: [facility] Anticipated DC date: 09/19/19 Family notified: Daughter, Web designer at bedside Transport by: Corey Harold   Per MD patient ready for DC to [facility]. RN, patient, patient's family, and facility notified of DC. Discharge Summary and FL2 sent to facility. RN to call report prior to discharge [phone]). DC packet on chart. Ambulance transport requested for patient.   CSW will sign off for now as social work intervention is no longer needed. Please consult [place] again if new needs arise.  Cedric Fishman, LCSW Clinical Social Worker [phone]    Final next level of care: Skilled Nursing Facility Barriers to Discharge: No Barriers Identified   Patient Goals and CMS Choice Patient states their goals for this hospitalization and ongoing recovery are:: Get stronger CMS Medicare.gov Compare Post Acute Care list provided to:: Patient Represenative (must comment)(daughter [name]) Choice offered to / list presented to : Adult Children  Discharge Placement   Existing PASRR number confirmed : 09/19/19          Patient chooses bed at: [facility] Patient to be transferred to facility by: [place] Name of family member notified: Dominga [place], daughter Patient and family notified of of transfer: 09/19/19  Discharge Plan and Services In-house Referral: Clinical Social Work Discharge Planning Services: CM Consult, Follow-up appt scheduled Post Acute Care Choice: [place]          DME Arranged: Hospital bed DME Agency: [employer] Date DME Agency Contacted: 09/16/19 Time DME Agency Contacted: [phone] Representative spoke with at DME Agency: [place], bed and 3/1 to be delivered to the  house, RW and [place] o be delivered to the hospital room [place] Arranged: RN, PT, OT, Nurse's Aide [place] Agency: [place] Date [place]: 09/16/19 Time [place]: 1330 Representative spoke with at [place]: [place] ([place]) Interventions     Readmission Risk Interventions No flowsheet data found.

## 2019-09-20 ENCOUNTER — Encounter (HOSPITAL_COMMUNITY): Payer: Self-pay

## 2019-09-20 ENCOUNTER — Non-Acute Institutional Stay (SKILLED_NURSING_FACILITY): Payer: Medicare Other | Admitting: Internal Medicine

## 2019-09-20 DIAGNOSIS — I10 Essential (primary) hypertension: Secondary | ICD-10-CM

## 2019-09-20 DIAGNOSIS — F329 Major depressive disorder, single episode, unspecified: Secondary | ICD-10-CM

## 2019-09-20 DIAGNOSIS — S92355D Nondisplaced fracture of fifth metatarsal bone, left foot, subsequent encounter for fracture with routine healing: Secondary | ICD-10-CM

## 2019-09-20 DIAGNOSIS — E1149 Type 2 diabetes mellitus with other diabetic neurological complication: Secondary | ICD-10-CM

## 2019-09-20 DIAGNOSIS — I639 Cerebral infarction, unspecified: Secondary | ICD-10-CM | POA: Diagnosis not present

## 2019-09-20 DIAGNOSIS — F32A Depression, unspecified: Secondary | ICD-10-CM

## 2019-09-21 LAB — PROTHROMBIN GENE MUTATION

## 2019-09-22 ENCOUNTER — Non-Acute Institutional Stay (SKILLED_NURSING_FACILITY): Payer: Medicare Other | Admitting: Internal Medicine

## 2019-09-22 ENCOUNTER — Encounter: Payer: Self-pay | Admitting: Internal Medicine

## 2019-09-22 DIAGNOSIS — S92309A Fracture of unspecified metatarsal bone(s), unspecified foot, initial encounter for closed fracture: Secondary | ICD-10-CM | POA: Insufficient documentation

## 2019-09-22 DIAGNOSIS — S92354D Nondisplaced fracture of fifth metatarsal bone, right foot, subsequent encounter for fracture with routine healing: Secondary | ICD-10-CM

## 2019-09-22 DIAGNOSIS — I63521 Cerebral infarction due to unspecified occlusion or stenosis of right anterior cerebral artery: Secondary | ICD-10-CM | POA: Diagnosis not present

## 2019-09-22 DIAGNOSIS — E785 Hyperlipidemia, unspecified: Secondary | ICD-10-CM

## 2019-09-22 DIAGNOSIS — E118 Type 2 diabetes mellitus with unspecified complications: Secondary | ICD-10-CM

## 2019-09-22 DIAGNOSIS — S92355D Nondisplaced fracture of fifth metatarsal bone, left foot, subsequent encounter for fracture with routine healing: Secondary | ICD-10-CM

## 2019-09-22 DIAGNOSIS — F32A Depression, unspecified: Secondary | ICD-10-CM

## 2019-09-22 DIAGNOSIS — F329 Major depressive disorder, single episode, unspecified: Secondary | ICD-10-CM

## 2019-09-22 DIAGNOSIS — Z794 Long term (current) use of insulin: Secondary | ICD-10-CM

## 2019-09-22 DIAGNOSIS — J4520 Mild intermittent asthma, uncomplicated: Secondary | ICD-10-CM

## 2019-09-22 DIAGNOSIS — I10 Essential (primary) hypertension: Secondary | ICD-10-CM | POA: Diagnosis not present

## 2019-09-22 DIAGNOSIS — J45909 Unspecified asthma, uncomplicated: Secondary | ICD-10-CM | POA: Insufficient documentation

## 2019-09-22 NOTE — Progress Notes (Signed)
: Provider:  Hennie Duos., MD Location:  [facility] Room Number: 511-P Place of Service:  SNF (31)  PCP: Patient, No Pcp Per Patient Care Team: Patient, No Pcp Per as PCP - General (General Practice)  Extended Emergency Contact Information Primary Emergency Contact: Carlisle Beers of [place] Phone: [phone] Mobile Phone: [phone] Relation: Daughter Secondary Emergency Contact: Ramonita Lab [place] of [place] Phone: [phone] Work Phone: [phone] Mobile Phone: [phone] Relation: Daughter     Allergies: Tape  Chief Complaint  Patient presents with   New Admit To SNF    New admission to [facility] SNF    HPI: Patient is a 58 y.o. female with history of large right MCA CVA, hypertension, diabetes mellitus type 2, hyperlipidemia, and asthma who was brought to the ED by her daughter after being found in a state of confusion in her home that is 2 hours from [place].  In the ED patient underwent CT that showed no acute intracranial hemorrhage, old large right MCA infarct and subacute infarct in the left frontal lobe.  MRI confirmed subacute moderate-sized CVA involving the left ACA with no mass-effect.  Patient was admitted to [facility] 10/21-28 where neurology was consulted and recommended skilled nursing facility.  Given patient's prognosis in the setting of multiple strokes palliative care medicine was also consulted in order to discuss goals of care with patient and her family.  It was ultimately decided that patient should remain full code and family would proceed with full measures of treatment.  Patient is admitted to skilled nursing facility for OT/PT.  While at skilled nursing facility patient will be followed for hyperlipidemia treated with Lipitor depression treated with Celexa and asthma treated with as needed albuterol.  Past Medical History:  Diagnosis Date   Asthma     CVA (cerebral vascular accident) ([place])    Diabetes mellitus without complication ([place])     Past Surgical History:  Procedure Laterality Date   IR ANGIO INTRA EXTRACRAN SEL COM CAROTID INNOMINATE BILAT MOD SED  09/15/2019   IR ANGIO VERTEBRAL SEL VERTEBRAL UNI L MOD SED  09/15/2019   NO PAST SURGERIES      Allergies as of 09/22/2019      Reactions   Tape Itching, Other (See Comments)   Causes a lot of scratching, also      Medication List       Accurate as of September 22, 2019 12:56 PM. If you have any questions, ask your nurse or doctor.        acetaminophen 650 MG CR tablet Commonly known as: TYLENOL Take 650 mg by mouth every 6 (six) hours as needed for pain. What changed: Another medication with the same name was removed. Continue taking this medication, and follow the directions you see here. Changed by: Inocencio Homes, MD   aspirin 325 MG EC tablet Take 1 tablet (325 mg total) by mouth daily.   atorvastatin 80 MG tablet Commonly known as: LIPITOR Take 1 tablet (80 mg total) by mouth daily at 6 PM.   blood glucose meter kit and supplies Dispense based on patient and insurance preference. Use up to four times daily as directed. (FOR ICD-9 250.00, 250.01).   citalopram 40 MG tablet Commonly known as: CELEXA Take 40 mg by mouth daily.   clopidogrel 75 MG tablet Commonly known as: PLAVIX Take 1 tablet (75 mg total) by mouth daily.   irbesartan 150 MG tablet Commonly  known as: AVAPRO Take 1 tablet (150 mg total) by mouth daily.   Lantus SoloStar 100 UNIT/ML Solostar Pen Generic drug: Insulin Glargine Inject 33 Units into the skin daily.   metFORMIN 500 MG 24 hr tablet Commonly known as: GLUCOPHAGE-XR Take 1,000 mg by mouth 2 (two) times daily.   NovoLOG FlexPen 100 UNIT/ML FlexPen Generic drug: insulin aspart Inject 15 Units into the skin 3 (three) times daily with meals.   omega-3 acid ethyl esters 1 g capsule Commonly known as: LOVAZA Take 1  capsule (1 g total) by mouth 2 (two) times daily.   ProAir HFA 108 (90 Base) MCG/ACT inhaler Generic drug: albuterol Inhale 2 puffs into the lungs 4 (four) times daily as needed for wheezing or shortness of breath.       No orders of the defined types were placed in this encounter.   Immunization History  Administered Date(s) Administered   Influenza,inj,Quad PF,6+ Mos 09/02/2017   Pneumococcal Polysaccharide-23 09/02/2017    Social History   Tobacco Use   Smoking status: Former Smoker   Smokeless tobacco: Never Used   Tobacco comment: QUIT IN 2008  Substance Use Topics   Alcohol use: Yes    Comment: RARE    Family history is Mom HTN  History reviewed. No pertinent family history.    Review of Systems  DATA OBTAINED: from nurse GENERAL:  no fevers, fatigue, appetite changes SKIN: No itching, or rash EYES: No eye pain, redness, discharge EARS: No earache, tinnitus, change in hearing NOSE: No congestion, drainage or bleeding  MOUTH/THROAT: No mouth or tooth pain, No sore throat RESPIRATORY: No cough, wheezing, SOB CARDIAC: No chest pain, palpitations, lower extremity edema  GI: No abdominal pain, No N/V/D or constipation, No heartburn or reflux  GU: No dysuria, frequency or urgency, or incontinence  MUSCULOSKELETAL: No unrelieved bone/joint pain NEUROLOGIC: No headache, dizziness or focal weakness PSYCHIATRIC: No c/o anxiety or sadness   Vitals:   09/22/19 1247  BP: 115/73  Pulse: 93  Resp: 18  Temp: 97.9 F (36.6 C)  SpO2: 94%    SpO2 Readings from Last 1 Encounters:  09/22/19 94%   Body mass index is 31.76 kg/m.     Physical Exam  GENERAL APPEARANCE: Alert, minimally conversant,  No acute distress.  SKIN: No diaphoresis rash HEAD: Normocephalic, atraumatic  EYES: Conjunctiva/lids clear. Pupils round, reactive. EOMs intact.  EARS: External exam WNL, canals clear. Hearing grossly normal.  NOSE: No deformity or discharge.  MOUTH/THROAT:  Lips w/o lesions  RESPIRATORY: Breathing is even, unlabored. Lung sounds are clear   CARDIOVASCULAR: Heart RRR no murmurs, rubs or gallops. No peripheral edema.   GASTROINTESTINAL: Abdomen is soft, non-tender, not distended w/ normal bowel sounds. GENITOURINARY: Bladder non tender, not distended  MUSCULOSKELETAL: No abnormal joints or musculature NEUROLOGIC:  Cranial nerves 2-12 grossly intact. Moves all extremities with left-sided weakness PSYCHIATRIC: Mood and affect appropriate to situation with some confusion, no behavioral issues  Patient Active Problem List   Diagnosis Date Noted   Palliative care by specialist    Goals of care, counseling/discussion    Encephalopathy    Acute CVA (cerebrovascular accident) ([place]) 09/14/2019   CVA (cerebral vascular accident) ([place]) 09/14/2019   Severe comorbid illness    AMS (altered mental status) 09/13/2019   Controlled diabetes mellitus type 2 with complications ([place]) 95/63/8756   Hyperlipidemia 09/02/2017   Hypertension 09/02/2017   Chest pain 09/02/2017      Labs reviewed: Basic Metabolic Panel:    Component  Value Date/Time   NA 137 09/19/2019 0329   K 3.7 09/19/2019 0329   CL 106 09/19/2019 0329   CO2 24 09/19/2019 0329   GLUCOSE 187 (H) 09/19/2019 0329   BUN 9 09/19/2019 0329   CREATININE 0.58 09/19/2019 0329   CALCIUM 8.6 (L) 09/19/2019 0329   PROT 8.0 09/13/2019 1257   ALBUMIN 3.9 09/13/2019 1257   AST 19 09/13/2019 1257   ALT 27 09/13/2019 1257   ALKPHOS 78 09/13/2019 1257   BILITOT 0.7 09/13/2019 1257   GFRNONAA >60 09/19/2019 0329   GFRAA >60 09/19/2019 0329    Recent Labs    09/13/19 1317  09/17/19 0255 09/18/19 0326 09/19/19 0329  NA  --    < > 138 137 137  K  --    < > 3.6 3.4* 3.7  CL  --    < > 105 104 106  CO2  --    < > 25 24 24   GLUCOSE  --    < > 245* 157* 187*  BUN  --    < > 14 11 9   CREATININE  --    < > 0.56 0.58 0.58  CALCIUM  --    < > 8.6* 8.3* 8.6*  MG 2.2  --   --   --   --     PHOS 4.0  --   --   --   --    < > = values in this interval not displayed.   Liver Function Tests: Recent Labs    09/13/19 1257  AST 19  ALT 27  ALKPHOS 78  BILITOT 0.7  PROT 8.0  ALBUMIN 3.9   Recent Labs    09/13/19 1317  LIPASE 32   Recent Labs    09/13/19 1410  AMMONIA 12   CBC: Recent Labs    09/13/19 1257 09/13/19 1339 09/14/19 0227 09/15/19 0643  WBC 9.5  --  10.3 8.6  HGB 14.5 15.0 13.1 14.2  HCT 44.8 44.0 39.7 42.6  MCV 93.1  --  91.9 90.8  PLT 370  --  331 332   Lipid Recent Labs    09/14/19 0227  CHOL 210*  HDL 35*  LDLCALC 145*  TRIG 152*    Cardiac Enzymes: Recent Labs    09/13/19 1317  CKTOTAL 220   BNP: No results for input(s): BNP in the last 8760 hours. No results found for: [facility] Lab Results  Component Value Date   HGBA1C 9.8 (H) 09/13/2019   Lab Results  Component Value Date   TSH 1.423 09/13/2019   Lab Results  Component Value Date   JQZESPQZ30 076 09/14/2019   No results found for: FOLATE No results found for: IRON, TIBC, FERRITIN  Imaging and Procedures obtained prior to SNF admission: Ct Angio Head W Or Wo Contrast  Result Date: 09/13/2019 CLINICAL DATA:  Initial evaluation for acute altered mental status. Known acute to subacute left frontal stroke. EXAM: CT ANGIOGRAPHY HEAD AND NECK TECHNIQUE: Multidetector CT imaging of the head and neck was performed using the standard protocol during bolus administration of intravenous contrast. Multiplanar CT image reconstructions and MIPs were obtained to evaluate the vascular anatomy. Carotid stenosis measurements (when applicable) are obtained utilizing NASCET criteria, using the distal internal carotid diameter as the denominator. CONTRAST:  40m OMNIPAQUE IOHEXOL 350 MG/ML SOLN COMPARISON:  Prior head CT and MRI from earlier the same day. FINDINGS: CTA NECK FINDINGS Aortic arch: Visualized aortic arch normal in caliber without aneurysm or  other acute finding. Bovine  arch with common origin of the right brachiocephalic and left common carotid artery noted. No hemodynamically significant stenosis seen about the origin of the great vessels. Visualized subclavian arteries patent without stenosis. Right carotid system: Right CCA patent from its origin to the bifurcation without stenosis. Eccentric soft plaque at the origin of the right ICA with mild 35% stenosis by NASCET criteria. Right ICA diminutive but otherwise patent to the skull base without additional stenosis or occlusion. Left carotid system: Left CCA patent from its origin to the bifurcation without stenosis. No significant atheromatous narrowing about the left bifurcation. Left ICA also diffusely diminutive but patent to the skull base without additional stenosis or occlusion. Vertebral arteries: Both vertebral arteries arise from the subclavian arteries. Left vertebral artery dominant and widely patent within the neck without stenosis, dissection, or occlusion. Right vertebral artery diffusely hypoplastic and is occluded at its origin, and remains largely occluded within the neck. Minimal scant distal reconstitution within the right V2 segment, which subsequently re occludes at the V3 segment. Right vertebral is occluded as it courses into the cranial vault. Skeleton: No acute osseous abnormality. No discrete lytic or blastic osseous lesions. Moderate cervical spondylosis at C5-6. Other neck: No other acute soft tissue abnormality within the neck. Salivary glands within normal limits. Thyroid normal. Enlarged left level IB node measures 12 mm in short axis (series 5, image 100). Additional mildly enlarged 11 mm right submental node (series 5, image 108). Findings are indeterminate, but could be reactive, although no acute inflammatory changes are seen within the neck. Upper chest: Visualized upper chest demonstrates no acute finding. Partially visualized lungs are grossly clear. Review of the MIP images confirms the  above findings CTA HEAD FINDINGS Anterior circulation: ICAs markedly diminutive bilaterally, but patent as they course into the cranial vault. Petrous segments widely patent. Scattered atheromatous plaque within the cavernous/supraclinoid ICAs bilaterally. Associated moderate stenosis at the anterior genu of the cavernous left ICA. There is a severe near occlusive stenosis at the para clinoid right ICA (series 7, image 28). There is severe near occlusive stenosis at the right ICA terminus (series 7, image 22). Right A1 segment patent. Left ICA terminus is perfused. Occlusion of the left A1 segment at its origin, with some retrograde filling across the circle-of-Willis. Grossly normal anterior communicating artery complex. There is occlusion of the distal right A2 segment (series 7, image 98). Left ACA demonstrates multifocal severe stenoses and demonstrates markedly attenuated flow, although does remain patent to its distal aspect. Severe fairly long segment stenosis seen involving the left M1 segment (series 7, image 25). Left MCA remains severely stenotic nearly to the level of the bifurcation, with only faintly visible flow. Markedly attenuated flow is seen distally within the left MCA branches, which also demonstrate small vessel atheromatous irregularity. On the right, the severe stenosis involving the right ICA terminus extends into the proximal-mid right M1 segment which demonstrates markedly diminutive flow. Distal right M1 segment patent. Negative right MCA bifurcation. Distal right MCA branches are perfused, although demonstrates diffuse small vessel atheromatous irregularity. Posterior circulation: Dominant left vertebral artery demonstrates scattered atheromatous irregularity but is patent to the vertebrobasilar junction without stenosis. Right vertebral occluded as it courses into the cranial vault. Retrograde filling of the distal right V4 segment two perfuse the right PICA. Left PICA not seen. Basilar  patent to its distal aspect without stenosis. Superior cerebral arteries patent bilaterally. Both of the posterior cerebral arteries primarily supplied via the basilar. Left  PCA perfused to its distal aspect without stenosis. Short-segment severe distal right P2 stenosis noted (series 10, image 25). Venous sinuses: Grossly patent, although not well assessed due to timing of the contrast bolus. Anatomic variants: None significant. Review of the MIP images confirms the above findings IMPRESSION: 1. 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 Secondary severely attenuated flow throughout both MCA distributions, left worse than right. 2. Occluded right A2 segment. Left ACA severely diseased and attenuated but remains patent. 3. Occluded left A1 segment. 4. Hypoplastic and occluded right vertebral artery. Dominant left vertebral artery patent without significant stenosis. Retrograde filling of the distal right V4 segment with perfusion of the right PICA. 5. Severe distal right P2 stenosis. 6. Otherwise relative patency of the major arterial vasculature of the neck, although the ICAs are diffusely diminutive and hypoplastic bilaterally. 7. Mildly enlarged level I and left level II lymph nodes as above, indeterminate, but could be reactive. Electronically Signed   By: Jeannine Boga M.D.   On: 09/13/2019 23:24   Dg Chest 2 View  Result Date: 09/13/2019 CLINICAL DATA:  Initial evaluation for acute altered mental status, history of asthma, former smoker. EXAM: CHEST - 2 VIEW COMPARISON:  Prior radiograph from 09/01/2017. FINDINGS: Mild cardiomegaly, stable. Mediastinal silhouette within normal limits. Lungs hypoinflated. Mild diffuse pulmonary vascular congestion with interstitial prominence, suggesting pulmonary interstitial congestion. No frank pulmonary edema. No pleural effusion. No focal  infiltrates. No pneumothorax. No acute osseous finding. IMPRESSION: 1. Cardiomegaly with mild diffuse pulmonary interstitial congestion without frank pulmonary edema. 2. No other active cardiopulmonary disease. Electronically Signed   By: Jeannine Boga M.D.   On: 09/13/2019 20:23   Ct Head Wo Contrast  Result Date: 09/13/2019 CLINICAL DATA:  58 year old female with altered mental status. EXAM: CT HEAD WITHOUT CONTRAST TECHNIQUE: Contiguous axial images were obtained from the base of the skull through the vertex without intravenous contrast. COMPARISON:  None. FINDINGS: Brain: There is mild age-related atrophy. Large area of old infarct and encephalomalacia noted in the right MCA territory. There is associated mild ex vacuo dilatation of the right lateral ventricle. A focal area of low attenuation in the left frontal lobe abutting the frontal horn of the left lateral ventricle most likely represents an area of subacute or old infarct. Clinical correlation is recommended. MRI may provide better characterization if clinically indicated. There is no acute intracranial hemorrhage. No mass effect or midline shift. No extra-axial fluid collection. Vascular: No hyperdense vessel or unexpected calcification. Skull: Normal. Negative for fracture or focal lesion. Sinuses/Orbits: No acute finding. Other: None IMPRESSION: 1. No acute intracranial hemorrhage. 2. Large right MCA territory old infarct and encephalomalacia. 3. Probable focal area of subacute or old infarct in the left frontal lobe. Clinical correlation is recommended. Electronically Signed   By: Anner Crete M.D.   On: 09/13/2019 14:48   Ct Angio Neck W Or Wo Contrast  Result Date: 09/13/2019 CLINICAL DATA:  Initial evaluation for acute altered mental status. Known acute to subacute left frontal stroke. EXAM: CT ANGIOGRAPHY HEAD AND NECK TECHNIQUE: Multidetector CT imaging of the head and neck was performed using the standard protocol during  bolus administration of intravenous contrast. Multiplanar CT image reconstructions and MIPs were obtained to evaluate the vascular anatomy. Carotid stenosis measurements (when applicable) are obtained utilizing NASCET criteria, using the distal internal carotid diameter as the denominator. CONTRAST:  23m OMNIPAQUE IOHEXOL 350 MG/ML SOLN COMPARISON:  Prior head CT and MRI from earlier the same day. FINDINGS: CTA NECK FINDINGS Aortic arch: Visualized aortic arch normal in caliber without aneurysm or other acute finding. Bovine arch with common origin of the right brachiocephalic and left common carotid artery noted. No hemodynamically significant stenosis seen about the origin of the great vessels. Visualized subclavian arteries patent without stenosis. Right carotid system: Right CCA patent from its origin to the bifurcation without stenosis. Eccentric soft plaque at the origin of the right ICA with mild 35% stenosis by NASCET criteria. Right ICA diminutive but otherwise patent to the skull base without additional stenosis or occlusion. Left carotid system: Left CCA patent from its origin to the bifurcation without stenosis. No significant atheromatous narrowing about the left bifurcation. Left ICA also diffusely diminutive but patent to the skull base without additional stenosis or occlusion. Vertebral arteries: Both vertebral arteries arise from the subclavian arteries. Left vertebral artery dominant and widely patent within the neck without stenosis, dissection, or occlusion. Right vertebral artery diffusely hypoplastic and is occluded at its origin, and remains largely occluded within the neck. Minimal scant distal reconstitution within the right V2 segment, which subsequently re occludes at the V3 segment. Right vertebral is occluded as it courses into the cranial vault. Skeleton: No acute osseous abnormality. No discrete lytic or blastic osseous lesions. Moderate cervical spondylosis at C5-6. Other neck: No  other acute soft tissue abnormality within the neck. Salivary glands within normal limits. Thyroid normal. Enlarged left level IB node measures 12 mm in short axis (series 5, image 100). Additional mildly enlarged 11 mm right submental node (series 5, image 108). Findings are indeterminate, but could be reactive, although no acute inflammatory changes are seen within the neck. Upper chest: Visualized upper chest demonstrates no acute finding. Partially visualized lungs are grossly clear. Review of the MIP images confirms the above findings CTA HEAD FINDINGS Anterior circulation: ICAs markedly diminutive bilaterally, but patent as they course into the cranial vault. Petrous segments widely patent. Scattered atheromatous plaque within the cavernous/supraclinoid ICAs bilaterally. Associated moderate stenosis at the anterior genu of the cavernous left ICA. There is a severe near occlusive stenosis at the para clinoid right ICA (series 7, image 28). There is severe near occlusive stenosis at the right ICA terminus (series 7, image 22). Right A1 segment patent. Left ICA terminus is perfused. Occlusion of the left A1 segment at its origin, with some retrograde filling across the circle-of-Willis. Grossly normal anterior communicating artery complex. There is occlusion of the distal right A2 segment (series 7, image 98). Left ACA demonstrates multifocal severe stenoses and demonstrates markedly attenuated flow, although does remain patent to its distal aspect. Severe fairly long segment stenosis seen involving the left M1 segment (series 7, image 25). Left MCA remains severely stenotic nearly to the level of the bifurcation, with only faintly visible flow. Markedly attenuated flow is seen distally within the left MCA branches, which also demonstrate small vessel atheromatous irregularity. On the right, the severe stenosis involving the right ICA terminus extends into the proximal-mid right M1 segment which demonstrates  markedly diminutive flow. Distal right M1 segment patent. Negative right MCA bifurcation. Distal right MCA branches are perfused, although demonstrates diffuse small vessel atheromatous irregularity. Posterior circulation: Dominant left vertebral artery demonstrates scattered atheromatous irregularity but is patent to the vertebrobasilar junction without stenosis. Right vertebral occluded as it courses into the cranial vault. Retrograde filling of the distal right V4 segment two perfuse the right PICA. Left PICA not seen. Basilar  patent to its distal aspect without stenosis. Superior cerebral arteries patent bilaterally. Both of the posterior cerebral arteries primarily supplied via the basilar. Left PCA perfused to its distal aspect without stenosis. Short-segment severe distal right P2 stenosis noted (series 10, image 25). Venous sinuses: Grossly patent, although not well assessed due to timing of the contrast bolus. Anatomic variants: None significant. Review of the MIP images confirms the above findings IMPRESSION: 1. 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 Secondary severely attenuated flow throughout both MCA distributions, left worse than right. 2. Occluded right A2 segment. Left ACA severely diseased and attenuated but remains patent. 3. Occluded left A1 segment. 4. Hypoplastic and occluded right vertebral artery. Dominant left vertebral artery patent without significant stenosis. Retrograde filling of the distal right V4 segment with perfusion of the right PICA. 5. Severe distal right P2 stenosis. 6. Otherwise relative patency of the major arterial vasculature of the neck, although the ICAs are diffusely diminutive and hypoplastic bilaterally. 7. Mildly enlarged level I and left level II lymph nodes as above, indeterminate, but could be reactive. Electronically Signed   By: Jeannine Boga M.D.   On: 09/13/2019 23:24   Mr Brain Wo Contrast  Result Date: 09/13/2019 CLINICAL DATA:  Initial evaluation for acute altered mental status, stroke suspected. EXAM: MRI HEAD WITHOUT CONTRAST TECHNIQUE: Multiplanar, multiecho pulse sequences of the brain and surrounding structures were obtained without intravenous contrast. COMPARISON:  Prior head CT from earlier the same day. FINDINGS: Brain: Examination moderately degraded by motion artifact. Generalized age-related cerebral atrophy. Large area of encephalomalacia and gliosis involving the right frontotemporal region and right basal ganglia compatible with chronic right MCA territory infarct. Superimposed areas of chronic hemosiderin staining. Associated wallerian degeneration at the right cerebral peduncle. Confluent area of restricted diffusion involving the left anterior genu of the corpus callosum with extension into the overlying subcortical left frontal lobe, compatible with acute to early subacute left ACA territory infarct. Finding corresponds with hypodensity on prior CT. No associated hemorrhage or significant mass effect. No other evidence for acute or subacute ischemia. No other areas of chronic cortical infarction. No acute intracranial hemorrhage. No mass lesion, midline shift or mass effect mild ex vacuo dilatation right lateral ventricle related to the chronic right MCA territory infarct. No hydrocephalus. No extra-axial fluid collection. Pituitary gland suprasellar region normal. Vascular: Major intracranial vascular flow voids are grossly maintained at the skull base. Skull and upper cervical spine: Craniocervical junction within normal limits. No focal marrow replacing lesion. No scalp soft tissue abnormality. Sinuses/Orbits: Globes and orbital soft tissues within normal limits. Paranasal sinuses are largely clear. No mastoid effusion. Other: None. IMPRESSION: 1. Moderate-sized acute to early subacute left ACA territory  infarct. No associated hemorrhage or mass effect. 2. Large chronic right MCA territory infarct. 3. Underlying age-related cerebral atrophy. Electronically Signed   By: Jeannine Boga M.D.   On: 09/13/2019 19:39   Dg Foot 2 Views Left  Result Date: 09/13/2019 CLINICAL DATA:  Initial evaluation for acute left foot pain. EXAM: LEFT FOOT - 2 VIEW COMPARISON:  None. FINDINGS: There is an acute minimally displaced fracture through the base of the left fifth metatarsal. No other acute fracture dislocation. 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 Osteopenia. No soft tissue abnormality. IMPRESSION: 1. Acute minimally displaced fracture through the base of the left fifth metatarsal. 2. Osteopenia. Electronically Signed   By: Marland Kitchen  Jeannine Boga M.D.   On: 09/13/2019 20:25   Vas [place] Lower Extremity Venous (dvt)  Result Date: 09/14/2019  Lower Venous Study Indications: Edema.  Performing Technologist: Antonieta Pert RDMS, RVT  Examination Guidelines: A complete evaluation includes B-mode imaging, spectral Doppler, color Doppler, and power Doppler as needed of all accessible portions of each vessel. Bilateral testing is considered an integral part of a complete examination. Limited examinations for reoccurring indications may be performed as noted.  +---------+---------------+---------+-----------+----------+--------------+  RIGHT     Compressibility Phasicity Spontaneity Properties Thrombus Aging  +---------+---------------+---------+-----------+----------+--------------+  CFV       Full            Yes       Yes                                    +---------+---------------+---------+-----------+----------+--------------+  SFJ       Full                                                             +---------+---------------+---------+-----------+----------+--------------+  FV Prox   Full                                                              +---------+---------------+---------+-----------+----------+--------------+  FV Mid    Full                                                             +---------+---------------+---------+-----------+----------+--------------+  FV Distal Full                                                             +---------+---------------+---------+-----------+----------+--------------+  PFV       Full                                                             +---------+---------------+---------+-----------+----------+--------------+  POP       Full            Yes       Yes                                    +---------+---------------+---------+-----------+----------+--------------+  PTV       Full                                                             +---------+---------------+---------+-----------+----------+--------------+  PERO      Full                                                             +---------+---------------+---------+-----------+----------+--------------+  GSV       Full                                                             +---------+---------------+---------+-----------+----------+--------------+   +---------+---------------+---------+-----------+----------+--------------+  LEFT      Compressibility Phasicity Spontaneity Properties Thrombus Aging  +---------+---------------+---------+-----------+----------+--------------+  CFV       Full            Yes       Yes                                    +---------+---------------+---------+-----------+----------+--------------+  SFJ       Full                                                             +---------+---------------+---------+-----------+----------+--------------+  FV Prox   Full                                                             +---------+---------------+---------+-----------+----------+--------------+  FV Mid    Full                                                              +---------+---------------+---------+-----------+----------+--------------+  FV Distal Full                                                             +---------+---------------+---------+-----------+----------+--------------+  PFV       Full                                                             +---------+---------------+---------+-----------+----------+--------------+  POP       Full            Yes       Yes                                    +---------+---------------+---------+-----------+----------+--------------+  PTV       Full                                                             +---------+---------------+---------+-----------+----------+--------------+  PERO      Full                                                             +---------+---------------+---------+-----------+----------+--------------+  GSV       Full                                                             +---------+---------------+---------+-----------+----------+--------------+     Summary: Right: There is no evidence of deep vein thrombosis in the lower extremity. No cystic structure found in the popliteal fossa. Left: There is no evidence of deep vein thrombosis in the lower extremity. No cystic structure found in the popliteal fossa.  *See table(s) above for measurements and observations. Electronically signed by Servando Snare MD on 09/14/2019 at 2:15:44 PM.    Final      Not all labs, radiology exams or other studies done during hospitalization come through on my EPIC note; however they are reviewed by me.    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  During admission patient was on 33 units of Lantus daily in addition to sensitive sliding scale with blood glucoses ranging from the 180s to the 200s SNF-continue Lantus Solostar 33 units subcu daily, Metformin 500 mg 24-hour tablet 1000 mg 2 times daily and NovoLog FlexPen 15 units 3 times daily with meals  Hypertension-Home metoprolol and combo ARB-HCTZ were discontinued due to hypotension.  Neurology recommended maintain blood pressure between 130 and 150 due to her secondary m[place]oya.  Patient was transitioned to IV [place] with blood pressure in normal limits SNF-continue Avapro 150 mg 1 p.o. daily  Hyperlipidemia SNF-not stated as uncontrolled; continue Lipitor 80 mg daily Lovaza 1 g twice daily  Left foot fifth metatarsal fracture-with osteopenia; Ortho was consulted and recommended outpatient follow-up with Dr. Stann Mainland, 50% weightbearing as tolerated SNF-3% weightbearing as tolerated  Depression SNF-not stated as uncontrolled; continue Celexa 40 mg daily  Asthma SNF-controlled; continue albuterol MDI 2 puffs 4 times daily as needed+    Time spent greater than 45 minutes;> 50% of time with patient was spent reviewing records, labs, tests and studies, counseling and developing plan of care Patient quarantine for Covid requiring full PPE for all  visits Hennie Duos, MD

## 2019-09-24 ENCOUNTER — Encounter: Payer: Self-pay | Admitting: Internal Medicine

## 2019-09-24 NOTE — Progress Notes (Signed)
\This is an acute visit.  Level of care is skilled.  Facility is Doctor, hospital complaint-acute visit status post hospitalization for CVA.  History of present illness.  Patient is a 58 year old femaleWho is brought to the emergency department after she was found confused by her daughter at her home.  MRI showed a subacute moderate-sized CVA involving left ACA with no mass-effect-neurology was consulted she was also seen by speech therapy and physical therapy recommended skilled nursing secondary to her significant left-sided weakness and confusion.  Palliative medicine also was consulted secondary to patient's history of multiple strokes-it was ultimately decided patient should remain full code and family would proceed with full measures of treatment.  She is on Plavix and aspirin and will be followed by neurology.  She does have a history of type 2 diabetes-and continues on Lantus 33 units a day as well as sliding scale insulin in addition to Glucophage 1000 mg twice daily-her hemoglobin A1c was 9.8 in the hospital however it was thought there may be a compliance issue with her home administration of medications.  In regards to hypertension her home metoprolol in combination ARB-hydrochlorothiazide medication was discontinued because of hypotension-neurology recommended keeping her blood pressure systolically between 458 and 150.  She was transitioned to Avapro 150 mg a day.  She also was found to have fifth metatarsal fracture on the left foot-recommendation is for follow-up with orthopedics 50% weightbearing as tolerated.  She also has a history of asthma her albuterol was continued but apparently she did not really need this much at all during her hospitalization.  Currently she is lying in bed she appears to be comfortable is not really speaking today she does make eye contact and does not really follow verbal commands-however there may be somewhat of a language barrier here  since apparently she does have limited [place]  Past Medical History:     Past Medical History:  Diagnosis Date   Asthma    CVA (cerebral vascular accident) ([place])    Diabetes mellitus without complication ([place])     Past Surgical History:      Past Surgical History:  Procedure Laterality Date   NO PAST SURGERIES      Social History: Social History        Tobacco Use   Smoking status: Former Smoker   Smokeless tobacco: Never Used   Tobacco comment: QUIT IN 2008  Substance Use Topics   Alcohol use: Yes    Comment: RARE   Drug use: No   Additional social history: Lives at home with significant other (not married).  Stays in close communication with 2 of her children who live in [place].  She has multiple other children who currently live in [place]. Please also refer to relevant sections of EMR.  Family History: No family history on file.   Allergies and Medications: No Known Allergies No current facility-administered medications on file prior to encounter.       MEDICATIONS      acetaminophen 325 MG tablet Commonly known as: TYLENOL Take 325-650 mg by mouth every 6 (six) hours as needed (for headaches or pain).   aspirin 325 MG EC tablet Take 1 tablet (325 mg total) by mouth daily.   atorvastatin 80 MG tablet Commonly known as: LIPITOR Take 1 tablet (80 mg total) by mouth daily at 6 PM.   blood glucose meter kit and supplies Dispense based on patient and insurance preference. Use up to four times daily as directed. (  FOR ICD-9 250.00, 250.01).   citalopram 40 MG tablet Commonly known as: CELEXA Take 40 mg by mouth daily.   clopidogrel 75 MG tablet Commonly known as: PLAVIX Take 1 tablet (75 mg total) by mouth daily.   irbesartan 150 MG tablet Commonly known as: AVAPRO Take 1 tablet (150 mg total) by mouth daily. Start taking on: September 20, 2019   Lantus SoloStar 100 UNIT/ML Solostar Pen Generic drug: Insulin  Glargine Inject 33 Units into the skin daily. What changed:   how much to take  when to take this   metFORMIN 500 MG 24 hr tablet Commonly known as: GLUCOPHAGE-XR Take 1,000 mg by mouth 2 (two) times daily. What changed: Another medication with the same name was removed. Continue taking this medication, and follow the directions you see here.   NovoLOG FlexPen 100 UNIT/ML FlexPen Generic drug: insulin aspart Inject 15 Units into the skin 3 (three) times daily with meals.   omega-3 acid ethyl esters 1 g capsule Commonly known as: LOVAZA Take 1 capsule (1 g total) by mouth 2 (two) times daily.   ProAir HFA 108 (90 Base) MCG/ACT inhaler Generic drug: albuterol Inhale 2 puffs into the lungs 4 (four) times daily as needed for wheezing or shortness of breath.                 Review of systems-is not really obtainable secondary to the patient not speaking-Per nursing she does speak some-nursing did not report any acute changes like pain shortness of breath fever chills   Physical exam.  In general this is a well-nourished middle-age female in no distress.  Her skin is warm and dry.  Eyes visual acuity appears to be intact sclera and conjunctive are clear.  Oropharynx was somewhat difficult to assess since patient did not really open her mouth very wide tongue appeared to be midline.  Chest is clear to auscultation she did have poor respiratory effort cannot read appreciate any labored breathing.  Heart is regular rate and rhythm without murmur gallop or rub she does not have significant lower acute extremity edema.  Abdomen is soft nontender with positive bowel sounds.  Musculoskeletal continues with significant left upper extremity weakness she does withdraw her left leg to painful stimuli she does move her right-sided extremities most prominently her right arm. There is some tenderness to palpation of her left fifth toe I do not see any increased edema or  erythema   Neurologic as noted above with significant left-sided weakness she does speak some per nursing but she did not really speak to me today.  Psych as noted above   Labs.  September 15, 2019.  WBC 8.6 hemoglobin 14.2 platelets 332.  September 19 2019.  Creatinine 0.58 BUN 9 sodium 137 potassium 3.7   Assessment and plan.  1.  History of subacute CVA left ACA-at this point continue supportive care with skilled nursing.  She will need follow-up with neurology Dr. Sethi-currently continues on Plavix 75 mg a day and aspirin 325.  She also continues on Lipitor 80 mg a day  2.  History of hypertension she continues on Avapro 150 mg a day-blood pressure today 885/02 goal systolic is  [id number] systolic at this point will monitor it appears she is close to goal but this will have to be watched-apparently hypotension was an issue in the hospital and her Lopressor and ARB hydrochlorothiazide combination was DC'd  #3 history of type 2 diabetes she is on Lantus 33 units a  day as well as Glucophage 1000 mg twice daily-hemoglobin A1c was 9.8 in the hospital but there was some question about home compliance at this point continue to monitor CBGs.  4.  History of fifth metatarsal fracture-again she will need follow-up with Dr. Stann Mainland of orthopedics she is 50% weightbearing as tolerated-she does have orders for Tylenol for pain but this will have to be watched.  5.  History of asthma apparently this is quite stable in the hospital she does have orders for albuterol as needed.  #6 depression she continues on Celexa 40 mg a day this will have to be watched  [id number]-of note greater than 35 minutes spent assessing patient-reviewing her chart and labs-discussing her status with nursing staff-and coordinating and formulating plan of care-of note greater than 50% of time spent coordinating a plan of care with input as noted above

## 2019-10-05 ENCOUNTER — Inpatient Hospital Stay (INDEPENDENT_AMBULATORY_CARE_PROVIDER_SITE_OTHER): Payer: Medicare Other | Admitting: Primary Care

## 2019-10-06 ENCOUNTER — Encounter: Payer: Self-pay | Admitting: Internal Medicine

## 2019-10-06 ENCOUNTER — Non-Acute Institutional Stay (SKILLED_NURSING_FACILITY): Payer: Medicare Other | Admitting: Internal Medicine

## 2019-10-06 DIAGNOSIS — S92355D Nondisplaced fracture of fifth metatarsal bone, left foot, subsequent encounter for fracture with routine healing: Secondary | ICD-10-CM

## 2019-10-06 DIAGNOSIS — Z794 Long term (current) use of insulin: Secondary | ICD-10-CM

## 2019-10-06 DIAGNOSIS — E785 Hyperlipidemia, unspecified: Secondary | ICD-10-CM | POA: Diagnosis not present

## 2019-10-06 DIAGNOSIS — I63521 Cerebral infarction due to unspecified occlusion or stenosis of right anterior cerebral artery: Secondary | ICD-10-CM | POA: Diagnosis not present

## 2019-10-06 DIAGNOSIS — E118 Type 2 diabetes mellitus with unspecified complications: Secondary | ICD-10-CM

## 2019-10-06 DIAGNOSIS — I10 Essential (primary) hypertension: Secondary | ICD-10-CM | POA: Diagnosis not present

## 2019-10-06 DIAGNOSIS — F32A Depression, unspecified: Secondary | ICD-10-CM

## 2019-10-06 DIAGNOSIS — F329 Major depressive disorder, single episode, unspecified: Secondary | ICD-10-CM

## 2019-10-06 DIAGNOSIS — J4520 Mild intermittent asthma, uncomplicated: Secondary | ICD-10-CM

## 2019-10-06 MED ORDER — METFORMIN HCL ER 500 MG PO TB24: 1000.0000 mg | ORAL_TABLET | Freq: Two times a day (BID) | ORAL | 0 refills | Status: AC

## 2019-10-06 MED ORDER — IRBESARTAN 150 MG PO TABS: 150.0000 mg | ORAL_TABLET | Freq: Every day | ORAL | 0 refills | Status: DC

## 2019-10-06 MED ORDER — ATORVASTATIN CALCIUM 80 MG PO TABS: 80.0000 mg | ORAL_TABLET | Freq: Every day | ORAL | 0 refills | Status: DC

## 2019-10-06 MED ORDER — OMEGA-3-ACID ETHYL ESTERS 1 G PO CAPS: 1.0000 g | ORAL_CAPSULE | Freq: Two times a day (BID) | ORAL | 0 refills | Status: DC

## 2019-10-06 MED ORDER — CLOPIDOGREL BISULFATE 75 MG PO TABS: 75.0000 mg | ORAL_TABLET | Freq: Every day | ORAL | 0 refills | Status: AC

## 2019-10-06 MED ORDER — NOVOLOG FLEXPEN 100 UNIT/ML ~~LOC~~ SOPN: 15.0000 [IU] | PEN_INJECTOR | Freq: Three times a day (TID) | SUBCUTANEOUS | 0 refills | Status: DC

## 2019-10-06 MED ORDER — CITALOPRAM HYDROBROMIDE 40 MG PO TABS: 40.0000 mg | ORAL_TABLET | Freq: Every day | ORAL | 0 refills | Status: DC

## 2019-10-06 MED ORDER — ALBUTEROL SULFATE HFA 108 (90 BASE) MCG/ACT IN AERS: 2.0000 | INHALATION_SPRAY | Freq: Four times a day (QID) | RESPIRATORY_TRACT | 0 refills | Status: AC | PRN

## 2019-10-06 MED ORDER — LANTUS SOLOSTAR 100 UNIT/ML ~~LOC~~ SOPN: 33.0000 [IU] | PEN_INJECTOR | Freq: Every day | SUBCUTANEOUS | 0 refills | Status: DC

## 2019-10-06 NOTE — Progress Notes (Signed)
Location:  [facility] [place] Room Number: 1107-P Place of Service:  SNF (31)  PCP: Patient, No Pcp Per Patient Care Team: Patient, No Pcp Per as PCP - General (General Practice)  Extended Emergency Contact Information Primary Emergency Contact: Carlisle Beers of [place] Phone: [phone] Mobile Phone: [phone] Relation: Daughter Secondary Emergency Contact: Ramonita Lab [place] of [place] Phone: [phone] Work Phone: [phone] Mobile Phone: [phone] Relation: Daughter  Allergies  Allergen Reactions   Tape Itching and Other (See Comments)    Causes a lot of scratching, also    Chief Complaint  Patient presents with   Discharge Note    Patient is seen for discharge home on 10/09/19    HPI:  58 y.o. female with history of large MCA CVA, hypertension, diabetes mellitus, hyperlipidemia, and asthma who was admitted to [facility] from 10/21-28 where patient was found to have a moderate-sized CVA involving the left ACA territory with no mass-effect.  Given patient's prognosis in the setting of multiple strokes palliative care medicine was also consulted in order to discuss goals of care with patient and her family.  It was ultimately decided that patient should remain full code and family would proceed with full measures of treatment.  Patient was admitted to skilled nursing facility for OT/PT.  Patient did not receive full goals before she was covered by insurance.  Patient be discharged to home with her daughter where she will continue physical therapy.    Past Medical History:  Diagnosis Date   Asthma    CVA (cerebral vascular accident) ([place])    Diabetes mellitus without complication ([place])     Past Surgical History:  Procedure Laterality Date   IR ANGIO INTRA EXTRACRAN SEL COM CAROTID INNOMINATE BILAT MOD SED  09/15/2019   IR ANGIO VERTEBRAL SEL VERTEBRAL UNI L MOD SED  09/15/2019    NO PAST SURGERIES       reports that she has quit smoking. She has never used smokeless tobacco. She reports current alcohol use. She reports that she does not use drugs. Social History   Socioeconomic History   Marital status: Single    Spouse name: Not on file   Number of children: Not on file   Years of education: Not on file   Highest education level: Not on file  Occupational History   Not on file  Social Needs   Financial resource strain: Not on file   Food insecurity    Worry: Not on file    Inability: Not on file   Transportation needs    Medical: Not on file    Non-medical: Not on file  Tobacco Use   Smoking status: Former Smoker   Smokeless tobacco: Never Used   Tobacco comment: QUIT IN 2008  Substance and Sexual Activity   Alcohol use: Yes    Comment: RARE   Drug use: No   Sexual activity: Not on file  Lifestyle   Physical activity    Days per week: Not on file    Minutes per session: Not on file   Stress: Not on file  Relationships   Social connections    Talks on phone: Not on file    Gets together: Not on file    Attends religious service: Not on file    Active member of club or organization: Not on file    Attends meetings of clubs or organizations: Not on file    Relationship status: Not  on file   Intimate partner violence    Fear of current or ex partner: Not on file    Emotionally abused: Not on file    Physically abused: Not on file    Forced sexual activity: Not on file  Other Topics Concern   Not on file  Social History Narrative   Lives at home with significant other (not married).  Stays in close communication with 2 of her children who live in [place].  She has multiple other children who currently live in [place].  Former smoker, quit in 2008.        Pertinent  Health Maintenance Due  Topic Date Due   FOOT EXAM  06/06/1971   OPHTHALMOLOGY EXAM  06/06/1971   PAP SMEAR-Modifier  06/05/1982   MAMMOGRAM   06/06/2011   COLONOSCOPY  06/06/2011   INFLUENZA VACCINE  06/24/2019   HEMOGLOBIN A1C  03/13/2020    Medications: Allergies as of 10/06/2019      Reactions   Tape Itching, Other (See Comments)   Causes a lot of scratching, also      Medication List       Accurate as of October 06, 2019  5:33 PM. If you have any questions, ask your nurse or doctor.        acetaminophen 650 MG CR tablet Commonly known as: TYLENOL Take 650 mg by mouth every 6 (six) hours as needed for pain.   aspirin 325 MG EC tablet Take 1 tablet (325 mg total) by mouth daily.   atorvastatin 80 MG tablet Commonly known as: LIPITOR Take 1 tablet (80 mg total) by mouth daily at 6 PM.   blood glucose meter kit and supplies Dispense based on patient and insurance preference. Use up to four times daily as directed. (FOR ICD-9 250.00, 250.01).   citalopram 40 MG tablet Commonly known as: CELEXA Take 40 mg by mouth daily.   clopidogrel 75 MG tablet Commonly known as: PLAVIX Take 1 tablet (75 mg total) by mouth daily.   irbesartan 150 MG tablet Commonly known as: AVAPRO Take 1 tablet (150 mg total) by mouth daily.   Lantus SoloStar 100 UNIT/ML Solostar Pen Generic drug: Insulin Glargine Inject 33 Units into the skin daily.   metFORMIN 500 MG 24 hr tablet Commonly known as: GLUCOPHAGE-XR Take 1,000 mg by mouth 2 (two) times daily.   NovoLOG FlexPen 100 UNIT/ML FlexPen Generic drug: insulin aspart Inject 15 Units into the skin 3 (three) times daily with meals.   omega-3 acid ethyl esters 1 g capsule Commonly known as: LOVAZA Take 1 capsule (1 g total) by mouth 2 (two) times daily.   ProAir HFA 108 (90 Base) MCG/ACT inhaler Generic drug: albuterol Inhale 2 puffs into the lungs 4 (four) times daily as needed for wheezing or shortness of breath.        Vitals:   10/06/19 1539  BP: 121/62  Pulse: 78  Resp: 18  Temp: (!) 97.5 F (36.4 C)  TempSrc: Oral  Weight: 165 lb 1.6 oz (74.9 kg)    Height: 5' (1.524 m)   Body mass index is 32.24 kg/m.  Physical Exam  GENERAL APPEARANCE: Alert, conversant. No acute distress.  HEENT: Unremarkable. RESPIRATORY: Breathing is even, unlabored. Lung sounds are clear   CARDIOVASCULAR: Heart RRR no murmurs, rubs or gallops. No peripheral edema.  GASTROINTESTINAL: Abdomen is soft, non-tender, not distended w/ normal bowel sounds.  NEUROLOGIC: Cranial nerves 2-12 grossly intact. Moves all extremities with left-sided weakness  Labs reviewed: Basic Metabolic Panel: Recent Labs    09/13/19 1317  09/17/19 0255 09/18/19 0326 09/19/19 0329  NA  --    < > 138 137 137  K  --    < > 3.6 3.4* 3.7  CL  --    < > 105 104 106  CO2  --    < > _0 GLUCOSE  --    < > 245* 157* 187*  BUN  --    < > _1 CREATININE  --    < > 0.56 0.58 0.58  CALCIUM  --    < > 8.6* 8.3* 8.6*  MG 2.2  --   --   --   --   PHOS 4.0  --   --   --   --    < > = values in this interval not displayed.   No results found for: [facility] Liver Function Tests: Recent Labs    09/13/19 1257  AST 19  ALT 27  ALKPHOS 78  BILITOT 0.7  PROT 8.0  ALBUMIN 3.9   Recent Labs    09/13/19 1317  LIPASE 32   Recent Labs    09/13/19 1410  AMMONIA 12   CBC: Recent Labs    09/13/19 1257 09/13/19 1339 09/14/19 0227 09/15/19 0643  WBC 9.5  --  10.3 8.6  HGB 14.5 15.0 13.1 14.2  HCT 44.8 44.0 39.7 42.6  MCV 93.1  --  91.9 90.8  PLT 370  --  331 332   Lipid Recent Labs    09/14/19 0227  CHOL 210*  HDL 35*  LDLCALC 145*  TRIG 152*   Cardiac Enzymes: Recent Labs    09/13/19 1317  CKTOTAL 220   BNP: No results for input(s): BNP in the last 8760 hours. CBG: Recent Labs    09/19/19 1148 09/19/19 1648 09/19/19 1804  GLUCAP 182* 225* 204*    Procedures and Imaging Studies During Stay: Ct Angio Head W Or Wo Contrast  Result Date: 09/13/2019 CLINICAL DATA:  Initial evaluation for acute altered mental status. Known acute to subacute  left frontal stroke. EXAM: CT ANGIOGRAPHY HEAD AND NECK TECHNIQUE: Multidetector CT imaging of the head and neck was performed using the standard protocol during bolus administration of intravenous contrast. Multiplanar CT image reconstructions and MIPs were obtained to evaluate the vascular anatomy. Carotid stenosis measurements (when applicable) are obtained utilizing NASCET criteria, using the distal internal carotid diameter as the denominator. CONTRAST:  62m OMNIPAQUE IOHEXOL 350 MG/ML SOLN COMPARISON:  Prior head CT and MRI from earlier the same day. FINDINGS: CTA NECK FINDINGS Aortic arch: Visualized aortic arch normal in caliber without aneurysm or other acute finding. Bovine arch with common origin of the right brachiocephalic and left common carotid artery noted. No hemodynamically significant stenosis seen about the origin of the great vessels. Visualized subclavian arteries patent without stenosis. Right carotid system: Right CCA patent from its origin to the bifurcation without stenosis. Eccentric soft plaque at the origin of the right ICA with mild 35% stenosis by NASCET criteria. Right ICA diminutive but otherwise patent to the skull base without additional stenosis or occlusion. Left carotid system: Left CCA patent from its origin to the bifurcation without stenosis. No significant atheromatous narrowing about the left bifurcation. Left ICA also diffusely diminutive but patent to the skull base without additional stenosis or occlusion. Vertebral arteries: Both vertebral arteries arise from the subclavian arteries. Left vertebral artery dominant and  widely patent within the neck without stenosis, dissection, or occlusion. Right vertebral artery diffusely hypoplastic and is occluded at its origin, and remains largely occluded within the neck. Minimal scant distal reconstitution within the right V2 segment, which subsequently re occludes at the V3 segment. Right vertebral is occluded as it courses into  the cranial vault. Skeleton: No acute osseous abnormality. No discrete lytic or blastic osseous lesions. Moderate cervical spondylosis at C5-6. Other neck: No other acute soft tissue abnormality within the neck. Salivary glands within normal limits. Thyroid normal. Enlarged left level IB node measures 12 mm in short axis (series 5, image 100). Additional mildly enlarged 11 mm right submental node (series 5, image 108). Findings are indeterminate, but could be reactive, although no acute inflammatory changes are seen within the neck. Upper chest: Visualized upper chest demonstrates no acute finding. Partially visualized lungs are grossly clear. Review of the MIP images confirms the above findings CTA HEAD FINDINGS Anterior circulation: ICAs markedly diminutive bilaterally, but patent as they course into the cranial vault. Petrous segments widely patent. Scattered atheromatous plaque within the cavernous/supraclinoid ICAs bilaterally. Associated moderate stenosis at the anterior genu of the cavernous left ICA. There is a severe near occlusive stenosis at the para clinoid right ICA (series 7, image 28). There is severe near occlusive stenosis at the right ICA terminus (series 7, image 22). Right A1 segment patent. Left ICA terminus is perfused. Occlusion of the left A1 segment at its origin, with some retrograde filling across the circle-of-Willis. Grossly normal anterior communicating artery complex. There is occlusion of the distal right A2 segment (series 7, image 98). Left ACA demonstrates multifocal severe stenoses and demonstrates markedly attenuated flow, although does remain patent to its distal aspect. Severe fairly long segment stenosis seen involving the left M1 segment (series 7, image 25). Left MCA remains severely stenotic nearly to the level of the bifurcation, with only faintly visible flow. Markedly attenuated flow is seen distally within the left MCA branches, which also demonstrate small vessel  atheromatous irregularity. On the right, the severe stenosis involving the right ICA terminus extends into the proximal-mid right M1 segment which demonstrates markedly diminutive flow. Distal right M1 segment patent. Negative right MCA bifurcation. Distal right MCA branches are perfused, although demonstrates diffuse small vessel atheromatous irregularity. Posterior circulation: Dominant left vertebral artery demonstrates scattered atheromatous irregularity but is patent to the vertebrobasilar junction without stenosis. Right vertebral occluded as it courses into the cranial vault. Retrograde filling of the distal right V4 segment two perfuse the right PICA. Left PICA not seen. Basilar patent to its distal aspect without stenosis. Superior cerebral arteries patent bilaterally. Both of the posterior cerebral arteries primarily supplied via the basilar. Left PCA perfused to its distal aspect without stenosis. Short-segment severe distal right P2 stenosis noted (series 10, image 25). Venous sinuses: Grossly patent, although not well assessed due to timing of the contrast bolus. Anatomic variants: None significant. Review of the MIP images confirms the above findings IMPRESSION: 1. 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 Secondary severely attenuated flow throughout both MCA distributions, left worse than right. 2. Occluded right A2 segment. Left ACA severely diseased and attenuated but remains patent. 3. Occluded left A1 segment. 4. Hypoplastic and occluded right vertebral artery. Dominant left vertebral artery patent without significant stenosis. Retrograde filling of the distal right V4 segment with perfusion of the right PICA. 5. Severe distal right P2 stenosis. 6.  Otherwise relative patency of the major arterial vasculature of the neck, although the ICAs are diffusely diminutive and  hypoplastic bilaterally. 7. Mildly enlarged level I and left level II lymph nodes as above, indeterminate, but could be reactive. Electronically Signed   By: Jeannine Boga M.D.   On: 09/13/2019 23:24   Dg Chest 2 View  Result Date: 09/13/2019 CLINICAL DATA:  Initial evaluation for acute altered mental status, history of asthma, former smoker. EXAM: CHEST - 2 VIEW COMPARISON:  Prior radiograph from 09/01/2017. FINDINGS: Mild cardiomegaly, stable. Mediastinal silhouette within normal limits. Lungs hypoinflated. Mild diffuse pulmonary vascular congestion with interstitial prominence, suggesting pulmonary interstitial congestion. No frank pulmonary edema. No pleural effusion. No focal infiltrates. No pneumothorax. No acute osseous finding. IMPRESSION: 1. Cardiomegaly with mild diffuse pulmonary interstitial congestion without frank pulmonary edema. 2. No other active cardiopulmonary disease. Electronically Signed   By: Jeannine Boga M.D.   On: 09/13/2019 20:23   Ct Head Wo Contrast  Result Date: 09/13/2019 CLINICAL DATA:  58 year old female with altered mental status. EXAM: CT HEAD WITHOUT CONTRAST TECHNIQUE: Contiguous axial images were obtained from the base of the skull through the vertex without intravenous contrast. COMPARISON:  None. FINDINGS: Brain: There is mild age-related atrophy. Large area of old infarct and encephalomalacia noted in the right MCA territory. There is associated mild ex vacuo dilatation of the right lateral ventricle. A focal area of low attenuation in the left frontal lobe abutting the frontal horn of the left lateral ventricle most likely represents an area of subacute or old infarct. Clinical correlation is recommended. MRI may provide better characterization if clinically indicated. There is no acute intracranial hemorrhage. No mass effect or midline shift. No extra-axial fluid collection. Vascular: No hyperdense vessel or unexpected calcification. Skull: Normal.  Negative for fracture or focal lesion. Sinuses/Orbits: No acute finding. Other: None IMPRESSION: 1. No acute intracranial hemorrhage. 2. Large right MCA territory old infarct and encephalomalacia. 3. Probable focal area of subacute or old infarct in the left frontal lobe. Clinical correlation is recommended. Electronically Signed   By: Anner Crete M.D.   On: 09/13/2019 14:48   Ct Angio Neck W Or Wo Contrast  Result Date: 09/13/2019 CLINICAL DATA:  Initial evaluation for acute altered mental status. Known acute to subacute left frontal stroke. EXAM: CT ANGIOGRAPHY HEAD AND NECK TECHNIQUE: Multidetector CT imaging of the head and neck was performed using the standard protocol during bolus administration of intravenous contrast. Multiplanar CT image reconstructions and MIPs were obtained to evaluate the vascular anatomy. Carotid stenosis measurements (when applicable) are obtained utilizing NASCET criteria, using the distal internal carotid diameter as the denominator. CONTRAST:  56m OMNIPAQUE IOHEXOL 350 MG/ML SOLN COMPARISON:  Prior head CT and MRI from earlier the same day. FINDINGS: CTA NECK FINDINGS Aortic arch: Visualized aortic arch normal in caliber without aneurysm or other acute finding. Bovine arch with common origin of the right brachiocephalic and left common carotid artery noted. No hemodynamically significant stenosis seen about the origin of the great vessels. Visualized subclavian arteries patent without stenosis. Right carotid system: Right CCA patent from its origin to the bifurcation without stenosis. Eccentric soft plaque at the origin of the right ICA with mild 35% stenosis by NASCET criteria. Right ICA diminutive but otherwise patent to the skull base without additional stenosis or occlusion. Left carotid system: Left CCA patent from its origin to the bifurcation without stenosis. No significant atheromatous narrowing about the left bifurcation. Left ICA also diffusely diminutive but  patent to the skull base without additional stenosis or occlusion. Vertebral arteries: Both vertebral arteries arise from the subclavian arteries. Left vertebral artery dominant and widely patent within the neck without stenosis, dissection, or occlusion. Right vertebral artery diffusely hypoplastic and is occluded at its origin, and remains largely occluded within the neck. Minimal scant distal reconstitution within the right V2 segment, which subsequently re occludes at the V3 segment. Right vertebral is occluded as it courses into the cranial vault. Skeleton: No acute osseous abnormality. No discrete lytic or blastic osseous lesions. Moderate cervical spondylosis at C5-6. Other neck: No other acute soft tissue abnormality within the neck. Salivary glands within normal limits. Thyroid normal. Enlarged left level IB node measures 12 mm in short axis (series 5, image 100). Additional mildly enlarged 11 mm right submental node (series 5, image 108). Findings are indeterminate, but could be reactive, although no acute inflammatory changes are seen within the neck. Upper chest: Visualized upper chest demonstrates no acute finding. Partially visualized lungs are grossly clear. Review of the MIP images confirms the above findings CTA HEAD FINDINGS Anterior circulation: ICAs markedly diminutive bilaterally, but patent as they course into the cranial vault. Petrous segments widely patent. Scattered atheromatous plaque within the cavernous/supraclinoid ICAs bilaterally. Associated moderate stenosis at the anterior genu of the cavernous left ICA. There is a severe near occlusive stenosis at the para clinoid right ICA (series 7, image 28). There is severe near occlusive stenosis at the right ICA terminus (series 7, image 22). Right A1 segment patent. Left ICA terminus is perfused. Occlusion of the left A1 segment at its origin, with some retrograde filling across the circle-of-Willis. Grossly normal anterior communicating  artery complex. There is occlusion of the distal right A2 segment (series 7, image 98). Left ACA demonstrates multifocal severe stenoses and demonstrates markedly attenuated flow, although does remain patent to its distal aspect. Severe fairly long segment stenosis seen involving the left M1 segment (series 7, image 25). Left MCA remains severely stenotic nearly to the level of the bifurcation, with only faintly visible flow. Markedly attenuated flow is seen distally within the left MCA branches, which also demonstrate small vessel atheromatous irregularity. On the right, the severe stenosis involving the right ICA terminus extends into the proximal-mid right M1 segment which demonstrates markedly diminutive flow. Distal right M1 segment patent. Negative right MCA bifurcation. Distal right MCA branches are perfused, although demonstrates diffuse small vessel atheromatous irregularity. Posterior circulation: Dominant left vertebral artery demonstrates scattered atheromatous irregularity but is patent to the vertebrobasilar junction without stenosis. Right vertebral occluded as it courses into the cranial vault. Retrograde filling of the distal right V4 segment two perfuse the right PICA. Left PICA not seen. Basilar patent to its distal aspect without stenosis. Superior cerebral arteries patent bilaterally. Both of the posterior cerebral arteries primarily supplied via the basilar. Left PCA perfused to its distal aspect without stenosis. Short-segment severe distal right P2 stenosis noted (series 10, image 25). Venous sinuses: Grossly patent, although not well assessed due to timing of the contrast bolus. Anatomic variants: None significant. Review of the MIP images confirms the above findings IMPRESSION: 1. 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 Secondary severely attenuated flow throughout  both MCA distributions, left worse than right. 2. Occluded right A2 segment. Left ACA severely diseased and attenuated but remains patent. 3. Occluded left A1 segment. 4. Hypoplastic and occluded right vertebral artery. Dominant left vertebral artery  patent without significant stenosis. Retrograde filling of the distal right V4 segment with perfusion of the right PICA. 5. Severe distal right P2 stenosis. 6. Otherwise relative patency of the major arterial vasculature of the neck, although the ICAs are diffusely diminutive and hypoplastic bilaterally. 7. Mildly enlarged level I and left level II lymph nodes as above, indeterminate, but could be reactive. Electronically Signed   By: Jeannine Boga M.D.   On: 09/13/2019 23:24   Mr Brain Wo Contrast  Result Date: 09/13/2019 CLINICAL DATA:  Initial evaluation for acute altered mental status, stroke suspected. EXAM: MRI HEAD WITHOUT CONTRAST TECHNIQUE: Multiplanar, multiecho pulse sequences of the brain and surrounding structures were obtained without intravenous contrast. COMPARISON:  Prior head CT from earlier the same day. FINDINGS: Brain: Examination moderately degraded by motion artifact. Generalized age-related cerebral atrophy. Large area of encephalomalacia and gliosis involving the right frontotemporal region and right basal ganglia compatible with chronic right MCA territory infarct. Superimposed areas of chronic hemosiderin staining. Associated wallerian degeneration at the right cerebral peduncle. Confluent area of restricted diffusion involving the left anterior genu of the corpus callosum with extension into the overlying subcortical left frontal lobe, compatible with acute to early subacute left ACA territory infarct. Finding corresponds with hypodensity on prior CT. No associated hemorrhage or significant mass effect. No other evidence for acute or subacute ischemia. No other areas of chronic cortical infarction. No acute intracranial  hemorrhage. No mass lesion, midline shift or mass effect mild ex vacuo dilatation right lateral ventricle related to the chronic right MCA territory infarct. No hydrocephalus. No extra-axial fluid collection. Pituitary gland suprasellar region normal. Vascular: Major intracranial vascular flow voids are grossly maintained at the skull base. Skull and upper cervical spine: Craniocervical junction within normal limits. No focal marrow replacing lesion. No scalp soft tissue abnormality. Sinuses/Orbits: Globes and orbital soft tissues within normal limits. Paranasal sinuses are largely clear. No mastoid effusion. Other: None. IMPRESSION: 1. Moderate-sized acute to early subacute left ACA territory infarct. No associated hemorrhage or mass effect. 2. Large chronic right MCA territory infarct. 3. Underlying age-related cerebral atrophy. Electronically Signed   By: Jeannine Boga M.D.   On: 09/13/2019 19:39   Dg Foot 2 Views Left  Result Date: 09/13/2019 CLINICAL DATA:  Initial evaluation for acute left foot pain. EXAM: LEFT FOOT - 2 VIEW COMPARISON:  None. FINDINGS: There is an acute minimally displaced fracture through the base of the left fifth metatarsal. No other acute fracture dislocation. 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 Osteopenia. No soft tissue abnormality. IMPRESSION: 1. Acute minimally displaced fracture through the base of the left fifth metatarsal. 2. Osteopenia. Electronically Signed   By: Jeannine Boga M.D.   On: 09/13/2019 20:25   Vas [place] Lower Extremity Venous (dvt)  Result Date: 09/14/2019  Lower Venous Study Indications: Edema.  Performing Technologist: Antonieta Pert RDMS, RVT  Examination Guidelines: A complete evaluation includes B-mode imaging, spectral Doppler, color Doppler, and power Doppler as needed of all accessible portions of each vessel. Bilateral testing is considered an integral part of a complete  examination. Limited examinations for reoccurring indications may be performed as noted.  +---------+---------------+---------+-----------+----------+--------------+  RIGHT     Compressibility Phasicity Spontaneity Properties Thrombus Aging  +---------+---------------+---------+-----------+----------+--------------+  CFV       Full            Yes       Yes                                    +---------+---------------+---------+-----------+----------+--------------+  SFJ       Full                                                             +---------+---------------+---------+-----------+----------+--------------+  FV Prox   Full                                                             +---------+---------------+---------+-----------+----------+--------------+  FV Mid    Full                                                             +---------+---------------+---------+-----------+----------+--------------+  FV Distal Full                                                             +---------+---------------+---------+-----------+----------+--------------+  PFV       Full                                                             +---------+---------------+---------+-----------+----------+--------------+  POP       Full            Yes       Yes                                    +---------+---------------+---------+-----------+----------+--------------+  PTV       Full                                                             +---------+---------------+---------+-----------+----------+--------------+  PERO      Full                                                             +---------+---------------+---------+-----------+----------+--------------+  GSV       Full                                                             +---------+---------------+---------+-----------+----------+--------------+   +---------+---------------+---------+-----------+----------+--------------+  LEFT       Compressibility Phasicity Spontaneity Properties Thrombus Aging  +---------+---------------+---------+-----------+----------+--------------+  CFV       Full            Yes       Yes                                    +---------+---------------+---------+-----------+----------+--------------+  SFJ       Full                                                             +---------+---------------+---------+-----------+----------+--------------+  FV Prox   Full                                                             +---------+---------------+---------+-----------+----------+--------------+  FV Mid    Full                                                             +---------+---------------+---------+-----------+----------+--------------+  FV Distal Full                                                             +---------+---------------+---------+-----------+----------+--------------+  PFV       Full                                                             +---------+---------------+---------+-----------+----------+--------------+  POP       Full            Yes       Yes                                    +---------+---------------+---------+-----------+----------+--------------+  PTV       Full                                                             +---------+---------------+---------+-----------+----------+--------------+  PERO      Full                                                             +---------+---------------+---------+-----------+----------+--------------+  GSV       Full                                                             +---------+---------------+---------+-----------+----------+--------------+     Summary: Right: There is no evidence of deep vein thrombosis in the lower extremity. No cystic structure found in the popliteal fossa. Left: There is no evidence of deep vein thrombosis in the lower extremity. No cystic structure found in the popliteal fossa.  *See table(s) above  for measurements and observations. Electronically signed by Servando Snare MD on 09/14/2019 at 2:15:44 PM.    Final    Ir Angio Intra Extracran Sel Com Carotid Innominate Bilat Mod Sed  Result Date: 09/19/2019 CLINICAL DATA:  New onset of right-sided weakness. 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 Abnormal CT angiogram of the head and neck. EXAM: BILATERAL COMMON CAROTID AND INNOMINATE ANGIOGRAPHY COMPARISON:  CT angiogram of the head and neck of September 13, 2019. MEDICATIONS: Heparin 1000 units IV; no antibiotic was administered within 1 hour of the procedure. ANESTHESIA/SEDATION: Versed 1 mg IV; Fentanyl 25 mcg IV Moderate Sedation Time:  26 minutes The patient was continuously monitored during the procedure by the interventional radiology nurse under my direct supervision. CONTRAST:  Isovue 300 approximately 60 mL. FLUOROSCOPY TIME:  Fluoroscopy Time: 6 minutes 54 seconds (1025 mGy). COMPLICATIONS: None immediate. TECHNIQUE: Informed written consent was obtained from the patient after a thorough discussion of the procedural risks, benefits and alternatives. All questions were addressed. Maximal Sterile Barrier Technique was utilized including caps, mask, sterile gowns, sterile gloves, sterile drape, hand hygiene and skin antiseptic. A timeout was performed prior to the initiation of the procedure. The right groin was prepped and draped in the usual sterile fashion. Thereafter using modified Seldinger technique, transfemoral access into the right common femoral artery was obtained without difficulty. Over a 0.035 inch guidewire, a 5 French Pinnacle sheath was inserted. Through this, and also over 0.035 inch guidewire, a 5 [place] JB 1 catheter was advanced to the aortic arch region and selectively positioned in the right common carotid artery, the right vertebral artery, the left common carotid artery and the left vertebral artery. FINDINGS: The right subclavian  arteriogram demonstrates nonvisualization of the right vertebral artery. There is no reconstitution of the right vertebrobasilar junction from the branches of the thyrocervical trunk, namely the ascending cervical branch. The right common carotid arteriogram demonstrates the right external carotid artery and its major branches to be widely patent. The right internal carotid artery at the bulb to the cranial skull base is widely patent. The petrous cavernous junction and the proximal cavernous segment are also widely patent. There is approximately 50% narrowing of the distal cavernous/supraclinoid segment. Patency of the right anterior cerebral artery is seen at the A1 A2 segments bilaterally. Cross-filling via the anterior communicating artery of the left anterior cerebral artery A2 segment to the A2 region is noted. There is severe hair pin narrowing of the proximal 1/2 of the right middle cerebral artery with numerous vascular channels reconstituting the distal right middle cerebral artery from the medial and the lateral lenticulostriate branches. Delayed reconstitution of the right MCA bifurcations is noted. There is also reconstitution of the anterior frontal supraorbital region from p.o. collaterals, the anterior cerebral artery  and the inferior division of the reconstituted middle cerebral artery. The left common carotid arteriogram demonstrates the left external carotid artery to be widely patent as are its branches. The left internal carotid artery at the bulb to the cranial skull base demonstrates wide patency. The petrous and cavernous segments are widely patent. 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 The dominant left vertebral artery origin is widely patent. The  vessel is seen to opacify to the cranial skull base. Wide patency is seen of the left vertebrobasilar junction, with attenuated caliber of the left posterior-inferior cerebellar artery. The basilar artery, the posterior cerebral arteries, the superior cerebellar arteries and the anterior-inferior cerebellar arteries demonstrate wide patency into the capillary and venous phases. Retrograde opacification of the right vertebrobasilar junction to the right posterior-inferior cerebellar artery is noted from the left vertebral artery injection. The more delayed arterial phase demonstrates numerous collaterals arising from the posterior temporal branch of both posterior cerebral arteries, and also the distal P3 branches of both cerebral arteries reconstitution partly the posterior aspect of the middle cerebral artery distributions, and also retrogradely of the anterior cerebral arteries to the level of the A2 A3 junction. 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 Approximately 50% stenosis of the right internal carotid supraclinoid segment, and severe hair pin stenosis of the right middle cerebral artery proximal M1 segment. Numerous vascular channels emanating from the lateral and the medial lenticulostriate branches reconstituting the right middle cerebral artery in the distal M1 segment as described. Angiographically occluded right vertebral artery proximally. Numerous pial collaterals arising from the posterior cerebral arteries P2 P3 segments, the posterior temporal artery branches of the posterior cerebral arteries bilaterally partially reconstituting the middle cerebral artery distribution bilaterally, and also retrogradely the anterior cerebral artery distribution bilaterally to the level of the A2 and A3 segments. PLAN: As per referring MD.  Electronically Signed   By: Luanne Bras M.D.   On: 09/18/2019 14:50   Ir Angio Vertebral Sel Vertebral Uni L Mod Sed  Result Date: 09/15/2019 CLINICAL DATA:  New onset of right-sided weakness. 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 Abnormal CT angiogram of the head and neck.  EXAM: BILATERAL COMMON CAROTID AND INNOMINATE ANGIOGRAPHY  COMPARISON:  CT angiogram of the head and neck of September 13, 2019.  MEDICATIONS: Heparin 1000 units IV; no antibiotic was administered within 1 hour of the procedure.  ANESTHESIA/SEDATION: Versed 1 mg IV; Fentanyl 25 mcg IV  Moderate Sedation Time:  26 minutes  The patient was continuously monitored during the procedure by the interventional radiology nurse under my direct supervision.  CONTRAST:  Isovue 300 approximately 60 mL.  FLUOROSCOPY TIME:  Fluoroscopy Time: 6 minutes 54 seconds (1025 mGy).  COMPLICATIONS: None immediate.  TECHNIQUE: Informed written consent was obtained from the patient after a thorough discussion of the procedural risks, benefits and alternatives. All questions were addressed. Maximal Sterile Barrier Technique was utilized including caps, mask, sterile gowns, sterile gloves, sterile drape, hand hygiene and skin antiseptic. A timeout was performed prior to the initiation of the procedure.  The right groin was prepped and draped in the usual sterile fashion. Thereafter using modified Seldinger technique, transfemoral access into the  right common femoral artery was obtained without difficulty. Over a 0.035 inch guidewire, a 5 French Pinnacle sheath was inserted. Through this, and also over 0.035 inch guidewire, a 5 [place] JB 1 catheter was advanced to the aortic arch region and selectively positioned in the right common carotid artery, the right vertebral artery, the left common carotid artery and the left vertebral artery.  FINDINGS: The right subclavian arteriogram demonstrates nonvisualization  of the right vertebral artery. There is no reconstitution of the right vertebrobasilar junction from the branches of the thyrocervical trunk, namely the ascending cervical branch.  The right common carotid arteriogram demonstrates the right external carotid artery and its major branches to be widely patent.  The right internal carotid artery at the bulb to the cranial skull base is widely patent.  The petrous cavernous junction and the proximal cavernous segment are also widely patent.  There is approximately 50% narrowing of the distal cavernous/supraclinoid segment.  Patency of the right anterior cerebral artery is seen at the A1 A2 segments bilaterally.  Cross-filling via the anterior communicating artery of the left anterior cerebral artery A2 segment to the A2 region is noted.  There is severe hair pin narrowing of the proximal 1/2 of the right middle cerebral artery with numerous vascular channels reconstituting the distal right middle cerebral artery from the medial and the lateral lenticulostriate branches. Delayed reconstitution of the right MCA bifurcations is noted. There is also reconstitution of the anterior frontal supraorbital region from p.o. collaterals, the anterior cerebral artery and the inferior division of the reconstituted middle cerebral artery.  The left common carotid arteriogram demonstrates the left external carotid artery to be widely patent as are its branches.  The left internal carotid artery at the bulb to the cranial skull base demonstrates wide patency.  The petrous and cavernous segments are widely patent.  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  The dominant left vertebral artery origin is widely patent.  The vessel is seen to  opacify to the cranial skull base. Wide patency is seen of the left vertebrobasilar junction, with attenuated caliber of the left posterior-inferior cerebellar artery.  The basilar artery, the posterior cerebral arteries, the superior cerebellar arteries and the anterior-inferior cerebellar arteries demonstrate wide patency into the capillary and venous phases.  Retrograde opacification of the right vertebrobasilar junction to the right posterior-inferior cerebellar artery is noted from the left vertebral artery injection.  The more delayed arterial phase demonstrates numerous collaterals arising from the posterior temporal branch of both posterior cerebral arteries, and also the distal P3 branches of both cerebral arteries reconstitution partly the posterior aspect of the middle cerebral artery distributions, and also retrogradely of the anterior cerebral arteries to the level of the A2 A3 junction.  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  Approximately 50% stenosis of the right internal carotid supraclinoid segment, and severe hair pin stenosis of the right middle cerebral artery proximal M1 segment. Numerous vascular channels emanating from the lateral and the medial lenticulostriate branches reconstituting the right middle cerebral artery in the distal M1 segment as described.  Angiographically occluded right vertebral artery proximally.  Numerous pial collaterals arising from the posterior cerebral arteries P2 P3 segments,  the posterior temporal artery branches of the posterior cerebral arteries bilaterally partially reconstituting the middle cerebral artery distribution bilaterally, and also retrogradely the anterior cerebral artery distribution bilaterally to the level of the A2 and A3 segments.  PLAN: As per referring MD.    Electronically Signed   By: Luanne Bras M.D.   On: 09/18/2019 14:50    Assessment/Plan:   No diagnosis found.   Patient is being discharged with the following home health services: OT/PT/ST  Patient is being discharged with the following durable medical equipment: Sliding board  Patient has been advised to f/u with their PCP in 1-2 weeks to bring them up to date on their rehab stay.  Social services at facility was responsible for arranging this appointment.  Pt was provided with a 30 day supply of prescriptions for medications and refills must be obtained from their PCP.  For controlled substances, a more limited supply may be provided adequate until PCP appointment only.   Time spent greater than 35 minutes;> 50% of time with patient was spent reviewing records, labs, tests and studies, counseling and developing plan of care . Hennie Duos MD

## 2019-10-09 MED FILL — CLOPIDOGREL 75 MG TABLET: 75 | 30 days supply | Qty: 30 | Fill #0

## 2019-10-09 MED FILL — ALBUTEROL SULFATE HFA 108 (: 108 (90 BAS | 17 days supply | Qty: 9 | Fill #0

## 2019-10-09 MED FILL — METFORMIN HCL ER 500 MG TB2: 500 | 30 days supply | Qty: 120 | Fill #0

## 2019-10-09 MED FILL — IRBESARTAN 150 MG TABLET: 150 | 30 days supply | Qty: 30 | Fill #0

## 2019-10-09 MED FILL — OMEGA-3 ETHYL ESTER 1 GM CA: 1 | 30 days supply | Qty: 60 | Fill #0

## 2019-10-09 MED FILL — LANTUS SOLOSTAR 100 UNITS/M: 100 | 45 days supply | Qty: 15 | Fill #0

## 2019-10-09 MED FILL — CITALOPRAM HBR 40 MG TABLET: 40 | 30 days supply | Qty: 30 | Fill #0

## 2019-10-11 ENCOUNTER — Encounter (HOSPITAL_COMMUNITY): Payer: Self-pay | Admitting: Emergency Medicine

## 2019-10-11 ENCOUNTER — Emergency Department (HOSPITAL_COMMUNITY): Payer: Medicare Other

## 2019-10-11 ENCOUNTER — Other Ambulatory Visit: Payer: Self-pay

## 2019-10-11 ENCOUNTER — Emergency Department (HOSPITAL_COMMUNITY)
Admission: EM | Admit: 2019-10-11 | Discharge: 2019-10-11 | Disposition: A | Discharge status: Home / Self Care | Payer: Medicare Other | Attending: Emergency Medicine | Admitting: Emergency Medicine

## 2019-10-11 DIAGNOSIS — I639 Cerebral infarction, unspecified: Secondary | ICD-10-CM | POA: Diagnosis not present

## 2019-10-11 DIAGNOSIS — J45909 Unspecified asthma, uncomplicated: Secondary | ICD-10-CM | POA: Diagnosis not present

## 2019-10-11 DIAGNOSIS — Z794 Long term (current) use of insulin: Secondary | ICD-10-CM | POA: Diagnosis not present

## 2019-10-11 DIAGNOSIS — I10 Essential (primary) hypertension: Secondary | ICD-10-CM | POA: Insufficient documentation

## 2019-10-11 DIAGNOSIS — E119 Type 2 diabetes mellitus without complications: Secondary | ICD-10-CM | POA: Diagnosis not present

## 2019-10-11 DIAGNOSIS — Z7902 Long term (current) use of antithrombotics/antiplatelets: Secondary | ICD-10-CM | POA: Diagnosis not present

## 2019-10-11 DIAGNOSIS — R4182 Altered mental status, unspecified: Secondary | ICD-10-CM | POA: Diagnosis present

## 2019-10-11 DIAGNOSIS — Z87891 Personal history of nicotine dependence: Secondary | ICD-10-CM | POA: Diagnosis not present

## 2019-10-11 DIAGNOSIS — Z79899 Other long term (current) drug therapy: Secondary | ICD-10-CM | POA: Insufficient documentation

## 2019-10-11 DIAGNOSIS — Z7982 Long term (current) use of aspirin: Secondary | ICD-10-CM | POA: Diagnosis not present

## 2019-10-11 LAB — CBC
HCT: 40.2 % (ref 36.0–46.0)
Hemoglobin: 12.8 g/dL (ref 12.0–15.0)
MCH: 30.3 pg (ref 26.0–34.0)
MCHC: 31.8 g/dL (ref 30.0–36.0)
MCV: 95 fL (ref 80.0–100.0)
Platelets: 355 10*3/uL (ref 150–400)
RBC: 4.23 MIL/uL (ref 3.87–5.11)
RDW: 12.5 % (ref 11.5–15.5)
WBC: 7.6 10*3/uL (ref 4.0–10.5)
nRBC: 0 % (ref 0.0–0.2)

## 2019-10-11 LAB — DIFFERENTIAL
Abs Immature Granulocytes: 0.04 10*3/uL (ref 0.00–0.07)
Basophils Absolute: 0 10*3/uL (ref 0.0–0.1)
Basophils Relative: 0 %
Eosinophils Absolute: 0.3 10*3/uL (ref 0.0–0.5)
Eosinophils Relative: 4 %
Immature Granulocytes: 1 %
Lymphocytes Relative: 23 %
Lymphs Abs: 1.8 10*3/uL (ref 0.7–4.0)
Monocytes Absolute: 0.5 10*3/uL (ref 0.1–1.0)
Monocytes Relative: 6 %
Neutro Abs: 5 10*3/uL (ref 1.7–7.7)
Neutrophils Relative %: 66 %

## 2019-10-11 LAB — COMPREHENSIVE METABOLIC PANEL
ALT: 22 U/L (ref 0–44)
AST: 21 U/L (ref 15–41)
Albumin: 3.1 g/dL — ABNORMAL LOW (ref 3.5–5.0)
Alkaline Phosphatase: 79 U/L (ref 38–126)
Anion gap: 12 (ref 5–15)
BUN: 10 mg/dL (ref 6–20)
CO2: 24 mmol/L (ref 22–32)
Calcium: 8.7 mg/dL — ABNORMAL LOW (ref 8.9–10.3)
Chloride: 103 mmol/L (ref 98–111)
Creatinine, Ser: 0.61 mg/dL (ref 0.44–1.00)
GFR calc Af Amer: >60 mL/min (ref >60)
GFR calc non Af Amer: >60 mL/min (ref >60)
Glucose, Bld: 164 mg/dL — ABNORMAL HIGH (ref 70–99)
Potassium: 3.9 mmol/L (ref 3.5–5.1)
Sodium: 139 mmol/L (ref 135–145)
Total Bilirubin: 0.8 mg/dL (ref 0.3–1.2)
Total Protein: 6.5 g/dL (ref 6.5–8.1)

## 2019-10-11 LAB — URINALYSIS, ROUTINE W REFLEX MICROSCOPIC
Bilirubin Urine: NEGATIVE
Glucose, UA: NEGATIVE mg/dL
Hgb urine dipstick: NEGATIVE
Ketones, ur: NEGATIVE mg/dL
Leukocytes,Ua: NEGATIVE
Nitrite: NEGATIVE
Protein, ur: NEGATIVE mg/dL
Specific Gravity, Urine: 1.01 (ref 1.005–1.030)
pH: 5 (ref 5.0–8.0)

## 2019-10-11 LAB — PROTIME-INR
INR: 1 (ref 0.8–1.2)
Prothrombin Time: 12.7 seconds (ref 11.4–15.2)

## 2019-10-11 LAB — APTT: aPTT: 28 seconds (ref 24–36)

## 2019-10-11 MED ORDER — SODIUM CHLORIDE 0.9 % IV BOLUS
500.0000 mL | Freq: Once | INTRAVENOUS | Status: AC
  Administered 2019-10-11: 500 mL via INTRAVENOUS

## 2019-10-11 MED ORDER — SODIUM CHLORIDE 0.9% FLUSH: 3.0000 mL | Freq: Once | INTRAVENOUS | Status: DC

## 2019-10-11 MED ORDER — GADOBUTROL 1 MMOL/ML IV SOLN
7.0000 mL | Freq: Once | INTRAVENOUS | Status: AC | PRN
  Administered 2019-10-11: 7 mL via INTRAVENOUS

## 2019-10-11 NOTE — ED Triage Notes (Signed)
Pt to triage via [facility]> pt home from rehab 2 days ago.  Daughter reports pt c/o dizziness last night with BP in the 80s.  When daughter went to check on her today she had AMS and would not swallow pills.  LKW 10:30pm.  History of stroke with L sided deficits.  EMS reports strong urine smell.  CBG 161.

## 2019-10-11 NOTE — ED Notes (Signed)
Ptar called for pt, stated it would be a bit.  

## 2019-10-11 NOTE — Consult Note (Addendum)
Neurology Consultation  Reason for Consult: Encephalopathy/altered mental status with abnormal MRI  Referring Physician: Dr. Gilford Raid  History is obtained from: Chart  HPI: Christina Evans is a 58 y.o. female with history of recent left anterior frontal lobe stroke, hypertension and hypercholesterolemia who re-presented to the ED today with altered mental status.  Per chart review, the patient was recently discharged from a skilled nursing facility following a right MCA stroke that left her with left-sided weakness.    She was recently discharged from [facility] after management by the Stroke Team of a new left frontal lobe stroke. She had a cerebral angiogram on 10/23 that showed an occluded left ICA at its terminus with extensive collateral, 50% stenosis right ICA siphon, right MCA proximal near occlusion with extensive collaterals. 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  Recently, the patient has been with family who have been caring for her.  Patient has some confusion at times.  They noticed she has had a poor appetite.  Family initially was concerned about a UTI.  Due to patient's altered mental status they brought her to the ED and an MRI was ordered.  MRI with and without contrast performed today showed the large old infarction in the right MCA territory; there were also subacute lesions in the left frontal white matter and left side of the anterior corpus callosum which were with expected evolutionary changes but no extension. The differential per radiology was a large demyelinating lesion versus an unusual white matter infarction. An MRI with contrast was then obtained, revealing peripheral enhancement of the lesion, appearing most consistent with reactive changes related to the recent infarction - no mass effect was appreciated.    Past Medical History:  Diagnosis Date  . Asthma   . CVA (cerebral vascular accident) ([place])   . Diabetes mellitus without complication ([place])      Family History  Problem Relation Age of Onset  . Hypertension Mother   . Hypertension Father    Social History:   reports that she has quit smoking. She has never used smokeless tobacco. She reports current alcohol use. She reports that she does not use drugs.  Medications  Current Facility-Administered Medications:  .  sodium chloride flush (NS) 0.9 % injection 3 mL, 3 mL, Intravenous, Once, [place], Adam, DO  Current Outpatient Medications:  .  acetaminophen (TYLENOL) 650 MG CR tablet, Take 650 mg by mouth every 6 (six) hours as needed for pain., Disp: , Rfl:  .  albuterol (PROAIR HFA) 108 (90 Base) MCG/ACT inhaler, Inhale 2 puffs into the lungs 4 (four) times daily as needed for wheezing or shortness of breath., Disp: 18 g, Rfl: 0 .  aspirin 325 MG EC tablet, Take 1 tablet (325 mg total) by mouth daily., Disp: 30 tablet, Rfl: 0 .  atorvastatin (LIPITOR) 80 MG tablet, Take 1 tablet (80 mg total) by mouth daily at 6 PM., Disp: 30 tablet, Rfl: 0 .  blood glucose meter kit and supplies, Dispense based on patient and insurance preference. Use up to four times daily as directed. (FOR ICD-9 250.00, 250.01)., Disp: 1 each, Rfl: 0 .  citalopram (CELEXA) 40 MG tablet, Take 1 tablet (40 mg total) by mouth daily., Disp: 30 tablet, Rfl: 0 .  clopidogrel (PLAVIX) 75 MG tablet, Take 1 tablet (75 mg total) by mouth daily., Disp: 30 tablet, Rfl: 0 .  insulin aspart (NOVOLOG FLEXPEN) 100 UNIT/ML FlexPen, Inject 15 Units into the skin 3 (three) times daily with meals., Disp:  15 mL, Rfl: 0 .  Insulin Glargine (LANTUS SOLOSTAR) 100 UNIT/ML Solostar Pen, Inject 33 Units into the skin daily., Disp: 15 mL, Rfl: 0 .  irbesartan (AVAPRO) 150 MG tablet, Take 1 tablet (150 mg total) by mouth daily., Disp: 30 tablet, Rfl: 0 .  metFORMIN (GLUCOPHAGE-XR) 500 MG 24 hr tablet, Take 2 tablets (1,000 mg total) by mouth 2 (two) times daily., Disp: 120 tablet, Rfl: 0 .  omega-3 acid ethyl esters (LOVAZA) 1 g capsule,  Take 1 capsule (1 g total) by mouth 2 (two) times daily., Disp: 60 capsule, Rfl: 0   Exam: Current vital signs: BP (!) 141/79   Pulse 82   Temp 98.5 F (36.9 C) (Oral)   Resp 20   SpO2 98%  Vital signs in last 24 hours: Temp:  [98.5 F (36.9 C)] 98.5 F (36.9 C) (11/18 1102) Pulse Rate:  [81-92] 82 (11/18 1515) Resp:  [12-21] 20 (11/18 1515) BP: (110-144)/(57-85) 141/79 (11/18 1515) SpO2:  [97 %-100 %] 98 % (11/18 1515)  ROS:  General ROS: negative for - chills, fatigue, fever, night sweats, weight gain or weight loss Psychological ROS: negative for - behavioral disorder, hallucinations, memory difficulties, mood swings or suicidal ideation Ophthalmic ROS: negative for - blurry vision, double vision, eye pain or loss of vision ENT ROS: negative for - epistaxis, nasal discharge, oral lesions, sore throat, tinnitus or vertigo Respiratory ROS: negative for - cough, hemoptysis, shortness of breath or wheezing Cardiovascular ROS: negative for - chest pain, dyspnea on exertion, edema or irregular heartbeat Gastrointestinal ROS: negative for - abdominal pain, diarrhea, hematemesis, nausea/vomiting or stool incontinence Genito-Urinary ROS: negative for - dysuria, hematuria, incontinence or urinary frequency/urgency Musculoskeletal ROS: negative for - joint swelling or muscular weakness Neurological ROS: as noted in HPI Dermatological ROS: negative for rash and skin lesion changes   Physical Exam  Constitutional: Appears well-developed and well-nourished.  Psych: Affect appropriate to situation Eyes: No scleral injection HENT: No OP obstrucion Head: Normocephalic.  Cardiovascular: Normal rate and regular rhythm.  Respiratory: Effort normal, non-labored breathing GI: Soft.  No distension. There is no tenderness.  Skin: WDI  Neuro: Mental Status: Patient is awake and alert.  She knows she is in the hospital but does not know which hospital.  She believes it is December, she cannot  recall the year, she was able to name objects such as watch, thumb, pinky.  She was able to follow commands such as taking her right thumb and touching her nose then pointing to the ceiling Cranial Nerves: II: Visual Fields are full. PERRL.  III,IV, VI: EOMI without ptosis or diplopia.  V: Facial sensation is symmetric to temperature VII: Left facial droop VIII: hearing is intact to voice X: Palate elevates symmetrically XI: Shoulder shrug is symmetric. XII: tongue is midline without atrophy or fasciculations.  Motor: Right arm and right leg 5/5.   Left arm is held in flexion contraction along with left fingers.  Patient has 4/5 strength in left upper extremity with slow movement.   Left leg has 4/5 strength with hip flexion and dropfoot on the left. Sensory: Sensation is symmetric to light touch and temperature in the arms and legs.  No extinction Deep Tendon Reflexes: 2+ in the upper extremity and lower extremities on the right side.  Left arm shows 3+ DTRs at brachioradialis and bicep, 4+ with clonus on the left knee jerk.  Unable to get ankle jerks secondary to patient not relaxing Plantars: Unable to get plantars as patient  would pull away Cerebellar: FNF intact  Labs I have reviewed labs in epic and the results pertinent to this consultation are:   CBC    Component Value Date/Time   WBC 7.6 10/11/2019 1104   RBC 4.23 10/11/2019 1104   HGB 12.8 10/11/2019 1104   HCT 40.2 10/11/2019 1104   PLT 355 10/11/2019 1104   MCV 95.0 10/11/2019 1104   MCH 30.3 10/11/2019 1104   MCHC 31.8 10/11/2019 1104   RDW 12.5 10/11/2019 1104   LYMPHSABS 1.8 10/11/2019 1104   MONOABS 0.5 10/11/2019 1104   EOSABS 0.3 10/11/2019 1104   BASOSABS 0.0 10/11/2019 1104    CMP     Component Value Date/Time   NA 139 10/11/2019 1104   K 3.9 10/11/2019 1104   CL 103 10/11/2019 1104   CO2 24 10/11/2019 1104   GLUCOSE 164 (H) 10/11/2019 1104   BUN 10 10/11/2019 1104   CREATININE 0.61 10/11/2019  1104   CALCIUM 8.7 (L) 10/11/2019 1104   PROT 6.5 10/11/2019 1104   ALBUMIN 3.1 (L) 10/11/2019 1104   AST 21 10/11/2019 1104   ALT 22 10/11/2019 1104   ALKPHOS 79 10/11/2019 1104   BILITOT 0.8 10/11/2019 1104   GFRNONAA >60 10/11/2019 1104   GFRAA >60 10/11/2019 1104    Lipid Panel     Component Value Date/Time   CHOL 210 (H) 09/14/2019 0227   TRIG 152 (H) 09/14/2019 0227   HDL 35 (L) 09/14/2019 0227   CHOLHDL 6.0 09/14/2019 0227   VLDL 30 09/14/2019 0227   LDLCALC 145 (H) 09/14/2019 0227     Imaging I have reviewed the images obtained:  CT-scan of the brain: Suspect extension of prior infarct involving the anterior left corpus callosum and periventricular white matter in the frontal lobe region into the gray matter of the anterior left frontal lobe.  Extensive prior infarcts involving portions of the posterior right frontal lobe, more superior aspects of the right temporal lobe as well as basal ganglia.  MRI examination of the brain: No new lesion seen. Old infarction in the right MCA territory. Acute/subacute lesion of the left frontal white matter and left side of the anterior corpus callosum is undergoing evolutionary changes but no extension. Differential diagnosis for this is a large demyelinating lesion versus an unusual white matter infarction with normal overlying cortex, possibly somehow related to the patient's large vessel occlusive disease and collateral brain vascular supply.  Follow up MRI brain with contrast: Peripheral enhancement of the large left frontal lesion more consistent with a subacute infarct than a demyelinating lesion.  Etta Quill PA-C [facility] [phone] 10/11/2019, 5:27 PM    Assessment: 58 year old female re-presenting to the hospital with increased confusion after recent discharge following management of a new left frontal lobe stroke with findings on angiography at that time being most consistent with MoyaMoya disease. 1.  Today's non-contrast MRI scan with acute/subacute lesion of the left frontal white matter and left side of the anterior corpus callosum is undergoing evolutionary changes but no extension. Felt by Radiologist on initial report to possibly represent a large demyelinating lesion versus unusual white matter infarction.   2. Follow up MRI with contrast revealed peripheral enhancement of the large left frontal lesion more consistent with a subacute infarct than a demyelinating lesion. 3. 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   Recommendations: -- Close outpatiient follow up with the stroke clinic -- Evaluate for  toxic/metabolic/infectious etiologies for her confusion.   I have seen and examined the patient. I have formulated the assessment and plan. My exam findings were observed and documented by Etta Quill, PA.  Electronically signed: Dr. Kerney Elbe

## 2019-10-11 NOTE — ED Notes (Signed)
Pt to MRI at this time.

## 2019-10-11 NOTE — ED Provider Notes (Addendum)
Pt signed out by Dr. Ronnald Nian pending results of MRI.  IMPRESSION:  No new lesion seen. Old infarction in the right MCA territory.  Acute/subacute lesion of the left frontal white matter and left side  of the anterior corpus callosum is undergoing evolutionary changes  but no extension. I think the differential diagnosis for this is a  large demyelinating lesion versus an unusual white matter infarction  with normal overlying cortex, possibly somehow related to the  patient's large vessel occlusive disease and collateral brain  vascular supply.   Pt d/w Dr. Cheral Marker (neurology) who will see her.  They recommended a MRI with contrast.  IMPRESSION:  Peripheral enhancement of the large left frontal lesion more  consistent with a subacute infarct than a demyelinating lesion.      Dr. Cheral Marker feels pt is stable from a neurologic standpoint.  There is nothing acute going on.  Pt d/w case management.  We will order home health nurse, aide, and sw.  Pt stable for d/c.  Return if worse.    Isla Pence, MD 10/11/19 Raye Sorrow    Isla Pence, MD 10/11/19 1944

## 2019-10-11 NOTE — ED Notes (Signed)
Return from MRI  Pt remains alert and oriented x's 4.  No complaints voiced

## 2019-10-11 NOTE — ED Provider Notes (Signed)
[facility] EMERGENCY DEPARTMENT Provider Note   CSN: [id number] Arrival date & time: 10/11/19  1056     History   Chief Complaint Chief Complaint  Patient presents with   Altered Mental Status    HPI Aarna Mihalko is a 58 y.o. female.     The history is provided by the patient and a caregiver.  Altered Mental Status Presenting symptoms: confusion   Severity:  Mild Most recent episode:  Today Episode history:  Multiple Timing:  Intermittent Progression:  Waxing and waning Chronicity:  New Context comment:  Recent stroke with left sided deficits, just back at home two days ago. Patient mildly confused today and not wanting to eat or drink. No new stroke symptoms. Possible low BP yesterday.  Associated symptoms: decreased appetite   Associated symptoms: no abdominal pain, no fever, no palpitations, no rash, no seizures and no vomiting     Past Medical History:  Diagnosis Date   Asthma    CVA (cerebral vascular accident) ([place])    Diabetes mellitus without complication ([place])     Patient Active Problem List   Diagnosis Date Noted   Metatarsal fracture 09/22/2019   Depression 09/22/2019   Asthma 09/22/2019   Palliative care by specialist    Goals of care, counseling/discussion    Encephalopathy    Acute CVA (cerebrovascular accident) ([place]) 09/14/2019   Acute arterial ischemic stroke, multifocal, anterior circulation, right ([place]) 09/14/2019   Severe comorbid illness    AMS (altered mental status) 09/13/2019   Controlled diabetes mellitus type 2 with complications ([place]) 70/11/7492   Hyperlipidemia 09/02/2017   Hypertension 09/02/2017   Chest pain 09/02/2017    Past Surgical History:  Procedure Laterality Date   IR ANGIO INTRA EXTRACRAN SEL COM CAROTID INNOMINATE BILAT MOD SED  09/15/2019   IR ANGIO VERTEBRAL SEL VERTEBRAL UNI L MOD SED  09/15/2019   NO PAST SURGERIES       OB History   No obstetric history on  file.      Home Medications    Prior to Admission medications   Medication Sig Start Date End Date Taking? Authorizing Provider  acetaminophen (TYLENOL) 650 MG CR tablet Take 650 mg by mouth every 6 (six) hours as needed for pain.    [provider]  albuterol (PROAIR HFA) 108 (90 Base) MCG/ACT inhaler Inhale 2 puffs into the lungs 4 (four) times daily as needed for wheezing or shortness of breath. 10/06/19   Hennie Duos, MD  aspirin 325 MG EC tablet Take 1 tablet (325 mg total) by mouth daily. 09/19/19   Matilde Haymaker, MD  atorvastatin (LIPITOR) 80 MG tablet Take 1 tablet (80 mg total) by mouth daily at 6 PM. 10/06/19   Hennie Duos, MD  blood glucose meter kit and supplies Dispense based on patient and insurance preference. Use up to four times daily as directed. (FOR ICD-9 250.00, 250.01). 09/02/17   Cheryln Manly, NP  citalopram (CELEXA) 40 MG tablet Take 1 tablet (40 mg total) by mouth daily. 10/06/19   Hennie Duos, MD  clopidogrel (PLAVIX) 75 MG tablet Take 1 tablet (75 mg total) by mouth daily. 10/06/19   Hennie Duos, MD  insulin aspart (NOVOLOG FLEXPEN) 100 UNIT/ML FlexPen Inject 15 Units into the skin 3 (three) times daily with meals. 10/06/19   Hennie Duos, MD  Insulin Glargine (LANTUS SOLOSTAR) 100 UNIT/ML Solostar Pen Inject 33 Units into the skin daily. 10/06/19   Inocencio Homes  D, MD  irbesartan (AVAPRO) 150 MG tablet Take 1 tablet (150 mg total) by mouth daily. 10/06/19   Hennie Duos, MD  metFORMIN (GLUCOPHAGE-XR) 500 MG 24 hr tablet Take 2 tablets (1,000 mg total) by mouth 2 (two) times daily. 10/06/19   Hennie Duos, MD  omega-3 acid ethyl esters (LOVAZA) 1 g capsule Take 1 capsule (1 g total) by mouth 2 (two) times daily. 10/06/19   Hennie Duos, MD    Family History No family history on file.  Social History Social History   Tobacco Use   Smoking status: Former Smoker   Smokeless tobacco: Never Used    Tobacco comment: QUIT IN 2008  Substance Use Topics   Alcohol use: Yes    Comment: RARE   Drug use: No     Allergies   Tape   Review of Systems Review of Systems  Constitutional: Positive for decreased appetite. Negative for chills and fever.  HENT: Negative for ear pain and sore throat.   Eyes: Negative for pain and visual disturbance.  Respiratory: Negative for cough and shortness of breath.   Cardiovascular: Negative for chest pain and palpitations.  Gastrointestinal: Negative for abdominal pain and vomiting.  Genitourinary: Negative for dysuria and hematuria.  Musculoskeletal: Negative for arthralgias and back pain.  Skin: Negative for color change and rash.  Neurological: Negative for seizures and syncope.  Psychiatric/Behavioral: Positive for confusion.  All other systems reviewed and are negative.    Physical Exam Updated Vital Signs  ED Triage Vitals  Enc Vitals Group     BP 10/11/19 1102 (!) 114/57     Pulse Rate 10/11/19 1102 92     Resp 10/11/19 1102 18     Temp 10/11/19 1102 98.5 F (36.9 C)     Temp Source 10/11/19 1102 Oral     SpO2 10/11/19 1102 100 %     Weight --      Height --      Head Circumference --      Peak Flow --      Pain Score 10/11/19 1059 0     Pain Loc --      Pain Edu? --      Excl. in [place]? --     Physical Exam Vitals signs and nursing note reviewed.  Constitutional:      General: She is not in acute distress.    Appearance: She is well-developed.  HENT:     Head: Normocephalic and atraumatic.     Nose: Nose normal.     Mouth/Throat:     Mouth: Mucous membranes are moist.  Eyes:     Extraocular Movements: Extraocular movements intact.     Conjunctiva/sclera: Conjunctivae normal.     Pupils: Pupils are equal, round, and reactive to light.  Neck:     Musculoskeletal: Normal range of motion and neck supple.  Cardiovascular:     Rate and Rhythm: Normal rate and regular rhythm.     Pulses: Normal pulses.     Heart  sounds: Normal heart sounds. No murmur.  Pulmonary:     Effort: Pulmonary effort is normal. No respiratory distress.     Breath sounds: Normal breath sounds.  Abdominal:     General: Abdomen is flat.     Palpations: Abdomen is soft.     Tenderness: There is no abdominal tenderness.  Skin:    General: Skin is warm and dry.     Capillary Refill: Capillary refill takes less than  2 seconds.  Neurological:     Mental Status: She is alert. Mental status is at baseline.     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      ED Treatments / Results  Labs (all labs ordered are listed, but only abnormal results are displayed) Labs Reviewed  COMPREHENSIVE METABOLIC PANEL - Abnormal; Notable for the following components:      Result Value   Glucose, Bld 164 (*)    Calcium 8.7 (*)    Albumin 3.1 (*)    All other components within normal limits  URINE CULTURE  PROTIME-INR  APTT  CBC  DIFFERENTIAL    EKG EKG Interpretation  Date/Time:  Wednesday October 11 2019 11:07:55 EST Ventricular Rate:  93 PR Interval:  152 QRS Duration: 78 QT Interval:  364 QTC Calculation: 452 R Axis:   -2 Text Interpretation: Normal sinus rhythm Normal ECG Confirmed by Lennice Sites [phone]) on 10/11/2019 12:27:52 PM   Radiology Ct Head Wo Contrast  Result Date: 10/11/2019 CLINICAL DATA:  Dizziness and altered mental status EXAM: CT HEAD WITHOUT CONTRAST TECHNIQUE: Contiguous axial images were obtained from the base of the skull through the vertex without intravenous contrast. COMPARISON:  Head CT and brain MRI September 13, 2019 FINDINGS: Brain: Age related volume loss is noted. There is enlargement right lateral  ventricle compared to the left lateral ventricle, likely due to ex vacuo phenomenon, stable. There is no intracranial mass, hemorrhage, extra-axial fluid collection, or midline shift. There is evidence of prior infarcts involving the posterior right frontal and much of the superior right temporal lobe region with extension into the right basal ganglia. Prior infarct involves portions of the right claustrum, external and extreme capsules, and insular cortex on the right, stable. There is a prior infarct involving much of the anterior aspect of the left corpus callosum extending into the left frontal lobe, primarily involving white matter but with involvement of a portion of the gray-white junction and gray matter of the left frontal lobe. There is suspected extension of infarct in the left frontal lobe anteriorly compared to prior studies. No other findings are present suggesting potential recent/acute infarct. Vascular: No hyperdense vessel. There is calcification in each carotid siphon region. Skull: The bony calvarium appears intact. Sinuses/Orbits: There is mucosal thickening in several ethmoid air cells. Other visualized paranasal sinuses are clear. Orbits appear symmetric bilaterally. Other: Mastoid air cells are clear. IMPRESSION: 1. Suspect extension of prior infarct involving the anterior left corpus callosum and periventricular white matter in the frontal lobe region into the gray matter of the anterior left frontal lobe. No other findings suggesting potential recent/acute infarct. 2. Extensive prior infarcts involving portions of the posterior right frontal lobe, more superior aspects of the right temporal lobe, as well as much of the right basal ganglia. 3.  No mass or hemorrhage. 4.  There are foci of arterial vascular calcification. 5.  Mucosal thickening noted in several ethmoid air cells. Electronically Signed   By: Lowella Grip III M.D.   On: 10/11/2019 11:50    Procedures Procedures  (including critical care time)  Medications Ordered in ED Medications  sodium chloride flush (NS)  0.9 % injection 3 mL (has no administration in time range)  sodium chloride 0.9 % bolus 500 mL (500 mLs Intravenous New Bag/Given 10/11/19 1431)     Initial Impression / Assessment and Plan / ED Course  I have reviewed the triage vital signs and the nursing notes.  Pertinent labs & imaging results that were available during my care of the patient were reviewed by me and considered in my medical decision making (see chart for details).     Jenin Birdsall is a 58 year old female with history of recent stroke, hypertension, high cholesterol who presents to the ED with altered mental status.  Patient with normal vitals.  No fever.  Patient recently discharged from skilled nursing facility following MCA stroke that left her with left-sided weakness.  Patient has been with her family who has been taking care of her over the last 2 days.  They do have home health.  Patient has had some confusion at times.  Has had poor appetite.  Possibly had low blood pressure/dizziness.  Patient arrives here with normal vitals.  No fever.  She appears to be neurologically at baseline.  Does not know what year or what state we are and but not sure if this is new or not.  Otherwise she has chronic left-sided weakness including facial weakness.  Does not appear to have any other new weakness or numbness on exam.  Family was concerned about possible urinary tract infection as she may have had some foul-smelling urine.  Patient denies any abdominal pain, chest pain, shortness of breath.  She is overall very pleasant.  Concern for infectious process versus possibly stroke however overall nonfocal.  Suspect likely ongoing chronic issues from recent stroke.  Will give small fluid bolus.  No significant anemia, electrolyte abnormality, kidney injury.  No significant leukocytosis.  EKG shows sinus rhythm.  CT scan of head shows  extension of prior infarct involving the anterior left corpus callosum and periventricular white matter.  Does not appear to have any recent strokes.  However did discuss with neurology and they recommend getting an MRI for further evaluation.  They are happy to consult if there is any new findings on MRI. Patient was signed out to oncoming ED staff with patient pending MRI and urinalysis.  Family has been updated with plan.  Case management will discuss any further home health needs for family.  Overall suspect UTI causing slightly worse confusion today.  However, suspect that this is likely chronic now given her recent stroke.  This chart was dictated using voice recognition software.  Despite best efforts to proofread,  errors can occur which can change the documentation meaning.    Final Clinical Impressions(s) / ED Diagnoses   Final diagnoses:  Altered mental status, unspecified altered mental status type    ED Discharge Orders    None       Lennice Sites, DO 10/11/19 1454

## 2019-10-11 NOTE — ED Notes (Signed)
Pt returned from MRI °

## 2019-10-12 ENCOUNTER — Telehealth (INDEPENDENT_AMBULATORY_CARE_PROVIDER_SITE_OTHER): Payer: Self-pay | Admitting: General Practice

## 2019-10-12 LAB — URINE CULTURE: Culture: NO GROWTH

## 2019-10-12 NOTE — Telephone Encounter (Signed)
FWD to PCP. Akyla Vavrek S Adeel Guiffre, CMA  

## 2019-10-12 NOTE — Telephone Encounter (Signed)
1) Medication(s) Requested (by name): -Accu-Check Teststrips  2) Pharmacy of Choice: -[place], [place] - [id number]  3.)Special request: -Pt was discharged from [facility] with no test strips to last until her follow up with [place] on 10/24/2019

## 2019-10-15 ENCOUNTER — Other Ambulatory Visit (INDEPENDENT_AMBULATORY_CARE_PROVIDER_SITE_OTHER): Payer: Self-pay | Admitting: Primary Care

## 2019-10-15 MED ORDER — GLUCOSE BLOOD VI STRP: ORAL_STRIP | 12 refills | Status: AC

## 2019-10-15 MED ORDER — ACCU-CHEK SOFT TOUCH LANCETS MISC: 12 refills | Status: AC

## 2019-10-15 MED ORDER — GLUCOSE BLOOD VI STRP: ORAL_STRIP | 12 refills | Status: DC

## 2019-10-15 MED ORDER — ACCU-CHEK SOFT TOUCH LANCETS MISC: 12 refills | Status: DC

## 2019-10-17 MED FILL — ACCU-CHEK FASTCLIX LANCETS: 30 days supply | Qty: 102 | Fill #0

## 2019-10-17 MED FILL — ACCU-CHEK GUIDE STRP: 30 days supply | Qty: 100 | Fill #0

## 2019-10-20 ENCOUNTER — Other Ambulatory Visit: Payer: Self-pay

## 2019-10-20 ENCOUNTER — Encounter (HOSPITAL_COMMUNITY): Payer: Self-pay | Admitting: Emergency Medicine

## 2019-10-20 ENCOUNTER — Emergency Department (HOSPITAL_COMMUNITY)
Admission: EM | Admit: 2019-10-20 | Discharge: 2019-10-20 | Disposition: A | Discharge status: Home / Self Care | Payer: Medicare Other | Attending: Emergency Medicine | Admitting: Emergency Medicine

## 2019-10-20 ENCOUNTER — Emergency Department (HOSPITAL_COMMUNITY): Payer: Medicare Other

## 2019-10-20 DIAGNOSIS — J45909 Unspecified asthma, uncomplicated: Secondary | ICD-10-CM | POA: Insufficient documentation

## 2019-10-20 DIAGNOSIS — Z7982 Long term (current) use of aspirin: Secondary | ICD-10-CM | POA: Insufficient documentation

## 2019-10-20 DIAGNOSIS — Z7902 Long term (current) use of antithrombotics/antiplatelets: Secondary | ICD-10-CM | POA: Insufficient documentation

## 2019-10-20 DIAGNOSIS — E119 Type 2 diabetes mellitus without complications: Secondary | ICD-10-CM | POA: Diagnosis not present

## 2019-10-20 DIAGNOSIS — I10 Essential (primary) hypertension: Secondary | ICD-10-CM | POA: Insufficient documentation

## 2019-10-20 DIAGNOSIS — Z87891 Personal history of nicotine dependence: Secondary | ICD-10-CM | POA: Insufficient documentation

## 2019-10-20 DIAGNOSIS — R531 Weakness: Secondary | ICD-10-CM | POA: Insufficient documentation

## 2019-10-20 DIAGNOSIS — Z79899 Other long term (current) drug therapy: Secondary | ICD-10-CM | POA: Insufficient documentation

## 2019-10-20 DIAGNOSIS — Z8673 Personal history of transient ischemic attack (TIA), and cerebral infarction without residual deficits: Secondary | ICD-10-CM | POA: Insufficient documentation

## 2019-10-20 DIAGNOSIS — Z794 Long term (current) use of insulin: Secondary | ICD-10-CM | POA: Diagnosis not present

## 2019-10-20 LAB — RAPID URINE DRUG SCREEN, HOSP PERFORMED
Amphetamines: NOT DETECTED
Barbiturates: NOT DETECTED
Benzodiazepines: NOT DETECTED
Cocaine: NOT DETECTED
Opiates: NOT DETECTED
Tetrahydrocannabinol: NOT DETECTED

## 2019-10-20 LAB — CBC WITH DIFFERENTIAL/PLATELET
Abs Immature Granulocytes: 0.02 10*3/uL (ref 0.00–0.07)
Basophils Absolute: 0.1 10*3/uL (ref 0.0–0.1)
Basophils Relative: 1 %
Eosinophils Absolute: 0.2 10*3/uL (ref 0.0–0.5)
Eosinophils Relative: 3 %
HCT: 46 % (ref 36.0–46.0)
Hemoglobin: 14.9 g/dL (ref 12.0–15.0)
Immature Granulocytes: 0 %
Lymphocytes Relative: 36 %
Lymphs Abs: 2.4 10*3/uL (ref 0.7–4.0)
MCH: 30.7 pg (ref 26.0–34.0)
MCHC: 32.4 g/dL (ref 30.0–36.0)
MCV: 94.8 fL (ref 80.0–100.0)
Monocytes Absolute: 0.6 10*3/uL (ref 0.1–1.0)
Monocytes Relative: 8 %
Neutro Abs: 3.4 10*3/uL (ref 1.7–7.7)
Neutrophils Relative %: 52 %
Platelets: 422 10*3/uL — ABNORMAL HIGH (ref 150–400)
RBC: 4.85 MIL/uL (ref 3.87–5.11)
RDW: 12.8 % (ref 11.5–15.5)
WBC: 6.6 10*3/uL (ref 4.0–10.5)
nRBC: 0 % (ref 0.0–0.2)

## 2019-10-20 LAB — URINALYSIS, COMPLETE (UACMP) WITH MICROSCOPIC
Bilirubin Urine: NEGATIVE
Glucose, UA: NEGATIVE mg/dL
Hgb urine dipstick: NEGATIVE
Ketones, ur: NEGATIVE mg/dL
Leukocytes,Ua: NEGATIVE
Nitrite: NEGATIVE
Protein, ur: NEGATIVE mg/dL
Specific Gravity, Urine: 1.011 (ref 1.005–1.030)
pH: 7 (ref 5.0–8.0)

## 2019-10-20 LAB — COMPREHENSIVE METABOLIC PANEL
ALT: 24 U/L (ref 0–44)
AST: 24 U/L (ref 15–41)
Albumin: 3.7 g/dL (ref 3.5–5.0)
Alkaline Phosphatase: 87 U/L (ref 38–126)
Anion gap: 14 (ref 5–15)
BUN: 10 mg/dL (ref 6–20)
CO2: 27 mmol/L (ref 22–32)
Calcium: 10 mg/dL (ref 8.9–10.3)
Chloride: 100 mmol/L (ref 98–111)
Creatinine, Ser: 0.66 mg/dL (ref 0.44–1.00)
GFR calc Af Amer: >60 mL/min (ref >60)
GFR calc non Af Amer: >60 mL/min (ref >60)
Glucose, Bld: 144 mg/dL — ABNORMAL HIGH (ref 70–99)
Potassium: 4.3 mmol/L (ref 3.5–5.1)
Sodium: 141 mmol/L (ref 135–145)
Total Bilirubin: 0.5 mg/dL (ref 0.3–1.2)
Total Protein: 7.7 g/dL (ref 6.5–8.1)

## 2019-10-20 LAB — PROTIME-INR
INR: 0.9 (ref 0.8–1.2)
Prothrombin Time: 12.1 seconds (ref 11.4–15.2)

## 2019-10-20 LAB — CBG MONITORING, ED: Glucose-Capillary: 146 mg/dL — ABNORMAL HIGH (ref 70–99)

## 2019-10-20 MED ORDER — SODIUM CHLORIDE 0.9 % IV SOLN
INTRAVENOUS | Status: DC
  Administered 2019-10-20: 19:00:00 via INTRAVENOUS
  Administered 2019-10-20: 1000 mL via INTRAVENOUS

## 2019-10-20 MED ORDER — GADOBUTROL 1 MMOL/ML IV SOLN
7.0000 mL | Freq: Once | INTRAVENOUS | Status: AC | PRN
  Administered 2019-10-20: 7 mL via INTRAVENOUS

## 2019-10-20 MED ORDER — INSULIN LISPRO 100 UNIT/ML ~~LOC~~ SOLN: 15.0000 [IU] | Freq: Three times a day (TID) | SUBCUTANEOUS | 2 refills | Status: DC

## 2019-10-20 NOTE — ED Notes (Signed)
Pt daughter Adrieanna: [phone] Pt daughter [name]: [phone]

## 2019-10-20 NOTE — Progress Notes (Signed)
Consult request has been received. CSW attempting to follow up at present time  [place] M. Donnamarie Shankles LCSWA Transitions of Care  Clinical Social Worker  Ph: [phone] 

## 2019-10-20 NOTE — ED Triage Notes (Signed)
Pt arrives via EMS from home with hx of stroke, left sided deficits. Having dizziness, hypotension. Reports odor from brief. Denies recent fevers. VSS in trasport. cbg 196.

## 2019-10-20 NOTE — Discharge Instructions (Addendum)
As discussed, is very important that you follow-up with your physician, and our neurology colleagues given your recent stroke.  Return here for concerning changes in your condition.  You have been prescribed Humalog insulin to pick up at the [facility] pharmacy which is 24 hours at the corner of [address] and [address].  Your previous discharge instructions indicated you are to take 15 units before meals.  You should be prepared to eat within 15 minutes of taking your insulin.  It will start lowering your blood sugar within 15 to 30 minutes.  Your prior discharge instructions also indicated that you are supposed to be taking another type of insulin called Lantus as well as Metformin.  If you do not have these medications and do not understand how to take them, review this with your family doctor as soon as possible.  Instructions and prescriptions were provided in your hospital discharge instructions.

## 2019-10-20 NOTE — Progress Notes (Signed)
CSW attempted to contact both of patients daughters to seek assistance with exploring issues and barriers to discharge. CSW was unsuccessful and left HIPPA compliant VM requesting call back.   [place] Transitions of Care  Clinical Social Worker  Ph: [phone]

## 2019-10-20 NOTE — ED Notes (Signed)
Straight cath'd pt for 700cc CYU. No foul odor noted, sent off for sample as ordered

## 2019-10-20 NOTE — Progress Notes (Signed)
CSW received call back from emergency contact, patients daughter, Maridee. CSW facilitated 3 way call with both of patients daughter to discuss barriers to discharge along with consult concerns.  [place] explains that patients insurance can not authorize filling her Novalog insulin prescription due to her not currently having a PCP follow her. [place] goes into detail and explains that the pt has obtain a new PCP and her first appointment is on 10/24/2019. [place] states that the insurance will help pay for the generic.  Shemiah voiced concerns about patient experiencing dizziness for 3 days.   CSW made EDP aware of family concerns. CSW updated patient on the status of her discharge.   [place] Transitions of Care  Clinical Social Worker  Ph: [phone]

## 2019-10-20 NOTE — ED Provider Notes (Signed)
[place] EMERGENCY DEPARTMENT Provider Note   CSN: [id number] Arrival date & time: 10/20/19  5638     History   Chief Complaint Chief Complaint  Patient presents with  . Dizziness  . Dysuria    HPI Christina Evans is a 58 y.o. female.     HPI Patient presents from home via EMS. EMS providers provide much of the history, as the patient herself is withdrawn, answers only some questions, briefly, without details. Per report the patient has been complaining of dizziness to family members for least the past day or days. Family also reports that the patient has been more withdrawn than usual, including the.  Just prior to recent hospitalization. The patient herself does state that she feels poorly, does not specify how, in spite of multiple different versions of asking this question. Level 5 caveat secondary to mental status change. EMS reports the patient was hemodynamically unremarkable in route, was in no distress, but what was similarly withdrawn, not forthcoming with details. Past Medical History:  Diagnosis Date  . Asthma   . CVA (cerebral vascular accident) ([place])   . Diabetes mellitus without complication [facility])     Patient Active Problem List   Diagnosis Date Noted  . Metatarsal fracture 09/22/2019  . Depression 09/22/2019  . Asthma 09/22/2019  . Palliative care by specialist   . Goals of care, counseling/discussion   . Encephalopathy   . Acute CVA (cerebrovascular accident) ([place]) 09/14/2019  . Acute arterial ischemic stroke, multifocal, anterior circulation, right ([place]) 09/14/2019  . Severe comorbid illness   . AMS (altered mental status) 09/13/2019  . Controlled diabetes mellitus type 2 with complications ([place]) 75/64/3329  . Hyperlipidemia 09/02/2017  . Hypertension 09/02/2017  . Chest pain 09/02/2017    Past Surgical History:  Procedure Laterality Date  . IR ANGIO INTRA EXTRACRAN SEL COM CAROTID INNOMINATE BILAT MOD SED  09/15/2019   . IR ANGIO VERTEBRAL SEL VERTEBRAL UNI L MOD SED  09/15/2019  . NO PAST SURGERIES       OB History   No obstetric history on file.      Home Medications    Prior to Admission medications   Medication Sig Start Date End Date Taking? Authorizing Provider  acetaminophen (TYLENOL) 650 MG CR tablet Take 650 mg by mouth every 6 (six) hours as needed for pain.    [provider]  albuterol (PROAIR HFA) 108 (90 Base) MCG/ACT inhaler Inhale 2 puffs into the lungs 4 (four) times daily as needed for wheezing or shortness of breath. 10/06/19   Hennie Duos, MD  aspirin 325 MG EC tablet Take 1 tablet (325 mg total) by mouth daily. 09/19/19   Matilde Haymaker, MD  atorvastatin (LIPITOR) 80 MG tablet Take 1 tablet (80 mg total) by mouth daily at 6 PM. 10/06/19   Hennie Duos, MD  blood glucose meter kit and supplies Dispense based on patient and insurance preference. Use up to four times daily as directed. (FOR ICD-9 250.00, 250.01). 09/02/17   Cheryln Manly, NP  citalopram (CELEXA) 40 MG tablet Take 1 tablet (40 mg total) by mouth daily. 10/06/19   Hennie Duos, MD  clopidogrel (PLAVIX) 75 MG tablet Take 1 tablet (75 mg total) by mouth daily. 10/06/19   Hennie Duos, MD  glucose blood test strip Use as instructed 10/15/19   Kerin Perna, NP  insulin aspart (NOVOLOG FLEXPEN) 100 UNIT/ML FlexPen Inject 15 Units into the skin 3 (three) times  daily with meals. 10/06/19   Hennie Duos, MD  Insulin Glargine (LANTUS SOLOSTAR) 100 UNIT/ML Solostar Pen Inject 33 Units into the skin daily. 10/06/19   Hennie Duos, MD  irbesartan (AVAPRO) 150 MG tablet Take 1 tablet (150 mg total) by mouth daily. 10/06/19   Hennie Duos, MD  Lancets (ACCU-CHEK SOFT [facility]) lancets Use as instructed 10/15/19   Kerin Perna, NP  metFORMIN (GLUCOPHAGE-XR) 500 MG 24 hr tablet Take 2 tablets (1,000 mg total) by mouth 2 (two) times daily. 10/06/19   Hennie Duos, MD   omega-3 acid ethyl esters (LOVAZA) 1 g capsule Take 1 capsule (1 g total) by mouth 2 (two) times daily. 10/06/19   Hennie Duos, MD    Family History Family History  Problem Relation Age of Onset  . Hypertension Mother   . Hypertension Father     Social History Social History   Tobacco Use  . Smoking status: Former Research scientist (life sciences)  . Smokeless tobacco: Never Used  . Tobacco comment: QUIT IN 2008  Substance Use Topics  . Alcohol use: Yes    Comment: RARE  . Drug use: No     Allergies   Tape   Review of Systems Review of Systems  Unable to perform ROS: Mental status change     Physical Exam Updated Vital Signs BP (!) 111/57   Pulse 89   Temp 97.8 F (36.6 C) (Oral)   Resp 18   SpO2 99%   Physical Exam Vitals signs and nursing note reviewed.  Constitutional:      General: She is not in acute distress.    Appearance: She is well-developed.     Comments: Obese adult female essentially noninteractive, answering questions briefly, then abstaining from interaction for prolonged periods.  HENT:     Head: Normocephalic and atraumatic.  Eyes:     Conjunctiva/sclera: Conjunctivae normal.  Cardiovascular:     Rate and Rhythm: Normal rate and regular rhythm.  Pulmonary:     Effort: Pulmonary effort is normal. No respiratory distress.     Breath sounds: Normal breath sounds. No stridor.  Abdominal:     General: There is no distension.  Skin:    General: Skin is warm and dry.  Neurological:     Mental Status: She is alert.     Comments: Face is symmetric, speech is brief, clear when she speaks.  Patient has 4/5 grip strength in the left hand, right unremarkable, does move all extremities spontaneously including the left arm, though she has known deficits in this area, acknowledges weakness, denies changes.  Psychiatric:        Behavior: Behavior is slowed and withdrawn.        Cognition and Memory: Cognition is impaired.      ED Treatments / Results  Labs (all  labs ordered are listed, but only abnormal results are displayed) Labs Reviewed  COMPREHENSIVE METABOLIC PANEL - Abnormal; Notable for the following components:      Result Value   Glucose, Bld 144 (*)    All other components within normal limits  CBC WITH DIFFERENTIAL/PLATELET - Abnormal; Notable for the following components:   Platelets 422 (*)    All other components within normal limits  URINALYSIS, COMPLETE (UACMP) WITH MICROSCOPIC - Abnormal; Notable for the following components:   Bacteria, UA RARE (*)    All other components within normal limits  CBG MONITORING, ED - Abnormal; Notable for the following components:   Glucose-Capillary 146 (*)  All other components within normal limits  RAPID URINE DRUG SCREEN, HOSP PERFORMED  PROTIME-INR  ETHANOL    EKG EKG Interpretation  Date/Time:  Friday October 20 2019 10:06:53 EST Ventricular Rate:  102 PR Interval:    QRS Duration: 81 QT Interval:  344 QTC Calculation: 449 R Axis:   -25 Text Interpretation: Sinus tachycardia Borderline left axis deviation Confirmed by Carmin Muskrat [phone]) on 10/20/2019 10:14:52 AM   Radiology Mr Brain W And Wo Contrast  Result Date: 10/20/2019 CLINICAL DATA:  Unexplained altered level of consciousness. Left-sided deficits EXAM: MRI HEAD WITHOUT AND WITH CONTRAST TECHNIQUE: Multiplanar, multiecho pulse sequences of the brain and surrounding structures were obtained without and with intravenous contrast. CONTRAST:  67m GADAVIST GADOBUTROL 1 MMOL/ML IV SOLN COMPARISON:  10/11/2019 FINDINGS: Brain: Unchanged unusual insult in the left genu of the corpus callosum extending to involve the parasagittal left frontal lobe and superolateral left frontal lobe. Centrally there is T2 and FLAIR hypointensity with a rim of T2 hyperintensity and serpiginous enhancement, unusual. Distribution is also atypical for infarct given there is extension more lateral to the typical ACA territory, although this could be  related to the variant intracranial anatomy from patient's secondary moyamoya disease. The lesion's wedge-shape is suggestive of a vascular cause. This may reflect atypical evolution distribution of infarct due to the severe vascular disease shown by catheter angiography. Enhancement does not have a clear leading edge to imply demyelinating disease. There is no provided history of immunocompromise. No mass effect to suggest neoplasm, including lymphoma (and the abnormal T2 hypointense area is low-density by CT). Subacute infarct with mineralization at the left caudate head and anterior putamen. No acute hemorrhage, hydrocephalus, or collection. Unchanged extent of extensive remote right MCA distribution infarct affecting the lateral frontal lobe and superolateral temporal lobe. Vascular: Normal flow voids and vascular enhancements Skull and upper cervical spine: Normal marrow signal Sinuses/Orbits: Negative IMPRESSION: 1. Unchanged left anterior corpus callosum and frontal lobe insult. 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 There is again no mass effect to imply neoplasm. Suggest follow-up in 8-12 weeks given the unusual findings. 2. Sizable remote right MCA infarct. Electronically Signed   By: JMonte FantasiaM.D.   On: 10/20/2019 14:46   Dg Chest Port 1 View  Result Date: 10/20/2019 CLINICAL DATA:  Altered level of consciousness.  Hypotension. EXAM: PORTABLE CHEST 1 VIEW COMPARISON:  October 11, 2019 FINDINGS: There is atelectasis in the left mid lung. Lungs elsewhere clear. Heart size and pulmonary vascularity are within normal limits. No adenopathy. No pneumothorax. No bone lesions. IMPRESSION: Left midlung atelectatic change. Lungs elsewhere clear. Cardiac silhouette within normal limits. Electronically Signed   By: WLowella GripIII M.D.   On: 10/20/2019 10:21    Procedures Procedures (including critical care time)  Medications  Ordered in ED Medications  0.9 %  sodium chloride infusion (1,000 mLs Intravenous New Bag/Given 10/20/19 1023)  gadobutrol (GADAVIST) 1 MMOL/ML injection 7 mL (7 mLs Intravenous Contrast Given 10/20/19 1412)     Initial Impression / Assessment and Plan / ED Course  I have reviewed the triage vital signs and the nursing notes.  Pertinent labs & imaging results that were available during my care of the patient were reviewed by me and considered in my medical decision making (see chart for details).    Chart review after the initial evaluation notable for hospitalization earlier this month, with MRI findings concerning for stroke, possibly white matter  lesions as well.    3:50 PM Patient in no distress, awake, alert.  She is more interactive than she was on arrival, and I discussed with her the MRI findings, which are essentially consistent with recent insult/stroke. Subsequently discussed patient's case with our neurology colleagues, and then with family members. Per neurology, no negation for additional emergent intervention, outpatient follow-up is appropriate. In discussing the patient's evaluation today with family members, they note some frustration with recent discharge from rehabilitation facility, and states that the patient has a scheduled follow-up on December 1, in 4 days. However, the patient has had difficulty with access to some of her medication, and now daughter also notes that the patient has had episodes of near syncope while at home. She states that during these episodes the patient has low blood pressure, decreased interactivity, but that they are transient. In reviewing patient's chart is clear that her blood pressure medication regimen was changed recently, and additional changes may be warranted on discharge.  Given family's difficulty with obtaining medication I discussed her case with social work for assistance with obtaining insulin, but with otherwise reassuring  findings, no evidence for acute new pathology on MRI, reassuring labs, no evidence for UTI, no hemodynamic stability here, the patient is appropriate for discharge with close outpatient follow-up.  Final Clinical Impressions(s) / ED Diagnoses   Final diagnoses:  None    ED Discharge Orders    None       Carmin Muskrat, MD 10/20/19 1552

## 2019-10-20 NOTE — ED Notes (Signed)
[facility] called for pt, stated they would have someone over shortly

## 2019-10-20 NOTE — ED Notes (Signed)
Patient verbalizes understanding of discharge instructions. Opportunity for questioning and answers were provided. Armband removed by staff, pt discharged from ED via [facility].  

## 2019-10-21 NOTE — ED Provider Notes (Signed)
Patient's family called with concern of absence of medications being sent to pharmacy, but I called the pharmacy myself, and medication was sent there yesterday evening, at 7 PM.  Prior to discharge yesterday, patient's case discussed with social work, and they evaluated the patient to facilitate medications.   Carmin Muskrat, MD 10/21/19 [phone]

## 2019-10-24 ENCOUNTER — Ambulatory Visit (INDEPENDENT_AMBULATORY_CARE_PROVIDER_SITE_OTHER): Payer: Medicare Other | Admitting: Primary Care

## 2019-10-26 MED FILL — metFORMIN HCL 1000 MG TABS: 1000 | 90 days supply | Qty: 180 | Fill #0

## 2019-10-27 MED FILL — ATORVASTATIN 80 MG TABLET: 80 | 30 days supply | Qty: 30 | Fill #0

## 2019-11-02 ENCOUNTER — Telehealth: Payer: Self-pay | Admitting: Neurology

## 2019-11-02 NOTE — Telephone Encounter (Signed)
I called Christina Evans pts daughter  back stating I could not get in contact with [facility]. I called Christina Evans back to change appt. Christina Evans stated she does not want a video visit for her mom because of the stroke.She would like her seen in person. I explain the check in process, no kids, one visitor and mask required during the visit. Christina Evans stated her mom maybe not be able to wear the mask the whole time while during the visit. She stated sometimes her mom is noncompliant with wearing the mask because of the stroke. I advise Christina Evans that pt has to wear the mask at all times because of COVID 19. IF she is unable to wear mask appt will be r/s to a mychart video visit. Appt was made for 11/21/2019 and she will discuss with her sister if pt can wear mask the during the visit. She stated her mom has been tested for COVID 19. I advise her [facility] policy is that pts have to wear a mask at all times during outpatient visit. She verbalized understanding.

## 2019-11-02 NOTE — Telephone Encounter (Signed)
I called Christina Evans pts to convert visit to mychart due to MD not being in office next week. She recommend I call Christina Evans who handles are her moms appt and transportation.

## 2019-11-02 NOTE — Telephone Encounter (Signed)
I called Christina Evans pts daughter and was unable to get in contact with her to r/s appt.

## 2019-11-02 NOTE — Telephone Encounter (Signed)
I called patient and LVM requesting patient call back to discuss a Mychart visit for 12/16 appt d/t MD being out of town. °

## 2019-11-07 ENCOUNTER — Other Ambulatory Visit: Payer: Self-pay | Admitting: Internal Medicine

## 2019-11-08 ENCOUNTER — Inpatient Hospital Stay: Payer: Medicare Other | Admitting: Neurology

## 2019-11-09 MED FILL — ACCU-CHEK GUIDE TEST STRIP: 30 days supply | Qty: 100 | Fill #1

## 2019-11-09 MED FILL — CITALOPRAM HBR 40 MG TABLET: 40 | 90 days supply | Qty: 90 | Fill #0

## 2019-11-10 MED FILL — CLOPIDOGREL 75 MG TABLET: 75 | 90 days supply | Qty: 90 | Fill #0

## 2019-11-10 MED FILL — HUMALOG 100 UNITS/ML KWIKPE: 100 | 86 days supply | Qty: 39 | Fill #0

## 2019-11-21 ENCOUNTER — Telehealth (INDEPENDENT_AMBULATORY_CARE_PROVIDER_SITE_OTHER): Payer: Medicare Other | Admitting: Neurology

## 2019-11-21 DIAGNOSIS — G8110 Spastic hemiplegia affecting unspecified side: Secondary | ICD-10-CM

## 2019-11-21 DIAGNOSIS — I6601 Occlusion and stenosis of right middle cerebral artery: Secondary | ICD-10-CM

## 2019-11-21 DIAGNOSIS — I6300 Cerebral infarction due to thrombosis of unspecified precerebral artery: Secondary | ICD-10-CM

## 2019-11-21 DIAGNOSIS — I675 Moyamoya disease: Secondary | ICD-10-CM

## 2019-11-21 NOTE — Progress Notes (Signed)
Virtual Visit via Video Note  I connected with Christina Evans on 11/21/19 at  3:30 PM EST by a video enabled telemedicine application and verified that I am speaking with the correct person using two identifiers.  Location: Patient:at her home Provider: at [facility] office   I discussed the limitations of evaluation and management by telemedicine and the availability of in person appointments. The patient expressed understanding and agreed to proceed.  The patient daughter was present throughout this visit and facilitated this virtual video visit.  History of Present Illness: Christina Evans is a pleasant 58 year old Hispanic lady seen today for initial virtual video follow-up visit following hospital admission for stroke in October 2020.  History is obtained from the patient, her daughter and review of electronic medical records and I personally reviewed imaging films in PACS. 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 on 09/13/2019 with worsening mentation for a few weeks and not been acting right for a few days.  She had noncontrast CT scan of the head which showed old known right-sided encephalomalacia from previous stroke and abnormal hypodensity in the left frontal region which subsequently on an MRI was confirmed to be subacute infarct.  Cerebral catheter angiogram done on 09/15/2019 by Dr. 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  The right internal carotid artery had 50% stenosis in the supraclinoid segment and the right MCA also had severe stenosis in the M1 segment.  Patient was discharged home and subsequently came back to the ER on  10/11/2019 for increasing confusion and had a repeat noncontrast MRI which showed evolutionary changes in the recent frontal infarct as well as some changes in anterior corpus callosum but no new extension or new infarct.  Postcontrast images showed serpiginous peripheral enhancement more consistent with a subacute infarct.  The patient's daughter states that she is continues to have cognitive difficulties and memory loss since her recent stroke.  Short-term memory is quite poor.  Patient is able to walk with a walker but needs 1 person assist she cannot be left alone.  Speech is gradually improving but cognitive impairment remains unchanged.  She remains on aspirin and Plavix tolerating well without bruising or bleeding.  Patient is bothered by her spastic hemiplegia and has trouble straightening her elbow and the daughter wonders if she may benefit with Botox   Observations/Objective: Physical and neurological exams are limited due to constraints from virtual video visit.  Pleasant middle-aged mildly obese Hispanic lady not in distress. She is awake alert appears oriented to time place and person.  She has poor recall and short-term memory.  There is mild dysarthria.  Extraocular movements are full range without nystagmus.  Moderate left  lower facial weakness.  Tongue midline.  Spastic left hemiplegia with right upper extremity strength 2/5 proximally at the shoulders and 1/5 at the elbows and 0/5 in the left hand and wrist and with fixed flexion contractures..  Left lower extremity strength is 3/5 with right ankle foot drop and dorsiflexors 0/5.  Tone appears increased on the right.  Right side is normal strength.  Gait not tested. 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  I recommend she continue aspirin and Plavix given her severe bilateral occlusive intracranial atherosclerosis for stroke prevention along with aggressive risk factor modification with strict control of hypertension with blood pressure goal below 130/90, diabetes with hemoglobin A1c goal below 6.5% and lipids with LDL cholesterol goal below 70 mg percent.  Continue ongoing home therapy and transition to outpatient physical and occupational therapy when this finishes.  Recommend referral to Dr. Terrace Arabia to consider Botox for improvement in a spastic upper extremity.  She will return for follow-up in the office in 3 months or call earlier if necessary   I discussed the assessment and treatment plan with the patient. The patient was provided an opportunity to ask questions and all were answered. The patient agreed with the plan and demonstrated an understanding of the instructions.   The patient was advised to call back or seek an in-person evaluation if the symptoms worsen or if the condition fails to improve as anticipated.  I provided 30 minutes of non-face-to-face time during this encounter.   Delia Heady, MD

## 2019-11-22 ENCOUNTER — Other Ambulatory Visit: Payer: Self-pay | Admitting: Internal Medicine

## 2019-11-22 ENCOUNTER — Telehealth: Payer: Self-pay | Admitting: *Deleted

## 2019-11-22 MED FILL — AZITHROMYCIN 250 MG TABLET: 250 | 36 days supply | Qty: 6 | Fill #0

## 2019-11-22 MED FILL — ATORVASTATIN 80 MG TABLET: 80 | 90 days supply | Qty: 90 | Fill #0

## 2019-11-22 NOTE — Telephone Encounter (Signed)
Referral for PT & OT faxed to [facility] outpatient neurorehab @ [phone]. Received a receipt of confirmation.

## 2019-11-30 ENCOUNTER — Ambulatory Visit (INDEPENDENT_AMBULATORY_CARE_PROVIDER_SITE_OTHER): Payer: Medicare Other | Admitting: Neurology

## 2019-11-30 ENCOUNTER — Other Ambulatory Visit: Payer: Self-pay | Admitting: Internal Medicine

## 2019-11-30 ENCOUNTER — Encounter: Payer: Self-pay | Admitting: Neurology

## 2019-11-30 ENCOUNTER — Other Ambulatory Visit: Payer: Self-pay

## 2019-11-30 VITALS — BP 110/70 | HR 100 | Temp 97.0°F | Ht 61.0 in

## 2019-11-30 DIAGNOSIS — G8110 Spastic hemiplegia affecting unspecified side: Secondary | ICD-10-CM | POA: Diagnosis not present

## 2019-11-30 DIAGNOSIS — G8929 Other chronic pain: Secondary | ICD-10-CM | POA: Insufficient documentation

## 2019-11-30 DIAGNOSIS — M5440 Lumbago with sciatica, unspecified side: Secondary | ICD-10-CM | POA: Diagnosis not present

## 2019-11-30 DIAGNOSIS — I6300 Cerebral infarction due to thrombosis of unspecified precerebral artery: Secondary | ICD-10-CM | POA: Diagnosis not present

## 2019-11-30 DIAGNOSIS — I6601 Occlusion and stenosis of right middle cerebral artery: Secondary | ICD-10-CM | POA: Diagnosis not present

## 2019-11-30 NOTE — Progress Notes (Signed)
PATIENT: Christina Evans DOB: 09/05/1961  Chief Complaint  Patient presents with  . Follow-up    New room with daughter (temp: 97.5). Pt in [place]. Here to discuss treatment options for spastic hemiparesis affecting nondominant side.      HISTORICAL  Christina Evans is a 59 year old female, accompanied by her daughter, seen in request by Dr. Leonie Man for evaluation of EMG guided botulism toxin injection for spastic left hemiparesis  I have reviewed and summarized the referring note from the referring physician.  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  She does have vascular risk factor of diabetes, insulin-dependent, hyperlipidemia,  She was found confused not at her baseline when her daughter visited her September 13, 2019, was treated at [facility], I personally reviewed MRI moderate-sized acute stroke ACA territory, right MCA infarction.  Cerebral catheter angiogram done on 09/15/2019 by Dr. 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  The right internal carotid artery had 50% stenosis in the supraclinoid segment and the right MCA also had severe stenosis in the M1 segment    Despite rehabilitation, she was not able to bounce back to her baseline, now she lives with her daughter, wheelchair-bound, no longer ambulatory, confused, also complains of significant low back pain, developed worsening urinary and bowel incontinence   REVIEW OF SYSTEMS: Full 14 system review of systems performed and notable only for as  above All other review of systems were negative.  ALLERGIES: Allergies  Allergen Reactions  . Tape Itching and Other (See Comments)    Causes a lot of scratching, also    HOME MEDICATIONS: Current Outpatient Medications  Medication Sig Dispense Refill  . acetaminophen (TYLENOL) 650 MG CR tablet Take 650 mg by mouth every 6 (six) hours as needed for pain.    Marland Kitchen albuterol (PROAIR HFA) 108 (90 Base) MCG/ACT inhaler Inhale 2 puffs into the lungs 4 (four) times daily as needed for wheezing or shortness of breath. 18 g 0  . aspirin 325 MG EC tablet Take 1 tablet (325 mg total) by mouth daily. 30 tablet 0  . atorvastatin (LIPITOR) 80 MG tablet Take 1 tablet (80 mg total) by mouth daily at 6 PM. 30 tablet 0  . blood glucose meter kit and supplies Dispense based on patient and insurance preference. Use up to four times daily as directed. (FOR ICD-9 250.00, 250.01). 1 each 0  . citalopram (CELEXA) 40 MG tablet Take 1 tablet (40 mg total) by mouth daily. 30 tablet 0  . clopidogrel (PLAVIX) 75 MG tablet Take 1 tablet (75 mg total) by mouth daily. 30 tablet 0  . glucose blood test strip Use as instructed 100 each 12  . insulin aspart (NOVOLOG FLEXPEN) 100 UNIT/ML FlexPen Inject 15 Units into the skin 3 (three) times daily with meals. 15 mL 0  . Insulin Glargine (LANTUS SOLOSTAR) 100 UNIT/ML Solostar Pen Inject 33 Units into the skin daily. (Patient taking differently: Inject 64 Units into the skin daily. ) 15 mL 0  . insulin lispro (HUMALOG) 100 UNIT/ML injection Inject 0.15 mLs (15 Units total) into the skin  3 (three) times daily before meals. 10 mL 2  . Lancets (ACCU-CHEK SOFT TOUCH) lancets Use as instructed 100 each 12  . metFORMIN (GLUCOPHAGE-XR) 500 MG 24 hr tablet Take 2 tablets (1,000 mg total) by mouth 2 (two) times daily. 120 tablet 0  . omega-3 acid ethyl esters (LOVAZA) 1 g capsule Take 1 capsule (1 g total) by mouth 2 (two) times daily. 60 capsule 0   No current facility-administered  medications for this visit.    PAST MEDICAL HISTORY: Past Medical History:  Diagnosis Date  . Asthma   . CVA (cerebral vascular accident) ([place])   . Diabetes mellitus without complication ([place])     PAST SURGICAL HISTORY: Past Surgical History:  Procedure Laterality Date  . IR ANGIO INTRA EXTRACRAN SEL COM CAROTID INNOMINATE BILAT MOD SED  09/15/2019  . IR ANGIO VERTEBRAL SEL VERTEBRAL UNI L MOD SED  09/15/2019    FAMILY HISTORY: Family History  Problem Relation Age of Onset  . Hypertension Mother   . Hypertension Father     SOCIAL HISTORY: Social History   Socioeconomic History  . Marital status: Single    Spouse name: Not on file  . Number of children: Not on file  . Years of education: Not on file  . Highest education level: Not on file  Occupational History  . Not on file  Tobacco Use  . Smoking status: Former Research scientist (life sciences)  . Smokeless tobacco: Never Used  . Tobacco comment: QUIT IN 2008  Substance and Sexual Activity  . Alcohol use: Yes    Comment: RARE  . Drug use: No  . Sexual activity: Not on file  Other Topics Concern  . Not on file  Social History Narrative   Lives at home with significant other (not married).     Stays in close communication with 2 of her children who live in [place].     She has multiple other children who currently live in [place].     Former smoker, quit in 2008.    Right handed   No caffeine   Social Determinants of Health   Financial Resource Strain:   . Difficulty of Paying Living Expenses: Not on file  Food Insecurity:   . Worried About Charity fundraiser in the Last Year: Not on file  . Ran Out of Food in the Last Year: Not on file  Transportation Needs:   . Lack of Transportation (Medical): Not on file  . Lack of Transportation (Non-Medical): Not on file  Physical Activity:   . Days of Exercise per Week: Not on file  . Minutes of Exercise per Session: Not on file  Stress:   . Feeling of Stress : Not on file    Social Connections:   . Frequency of Communication with Friends and Family: Not on file  . Frequency of Social Gatherings with Friends and Family: Not on file  . Attends Religious Services: Not on file  . Active Member of Clubs or Organizations: Not on file  . Attends Archivist Meetings: Not on file  . Marital Status: Not on file  Intimate Partner Violence:   . Fear of Current or Ex-Partner: Not on file  . Emotionally Abused: Not on file  . Physically Abused: Not on file  . Sexually Abused: Not on file     PHYSICAL EXAM   Vitals:   11/30/19 0756  BP: 110/70  Pulse: 100  Temp: (!) 97 F (36.1 C)  SpO2: 94%  Height: 5' 1"  (1.549 m)    Not recorded      Body mass index is 31.2 kg/m.  PHYSICAL EXAMNIATION:  Gen: NAD, conversant, well nourised, well groomed                     Cardiovascular: Regular rate rhythm, no peripheral edema, warm, nontender. Eyes: Conjunctivae clear without exudates or hemorrhage Neck: Supple, no carotid bruits. Pulmonary: Clear to auscultation bilaterally   NEUROLOGICAL EXAM:  MENTAL STATUS: Speech:    Speech is normal; fluent and spontaneous with normal comprehension.  Cognition:     Orientation to time, place and person     Normal recent and remote memory     Normal Attention span and concentration     Normal Language, naming, repeating,spontaneous speech     Fund of knowledge   CRANIAL NERVES: CN II: Visual fields are full to confrontation. Pupils are round equal and briskly reactive to light. CN III, IV, VI: extraocular movement are normal. No ptosis. CN V: Facial sensation is intact to light touch CN VII: normal eye closure  CN VIII: Hearing is normal to causal conversation. CN IX, X: Phonation is normal. CN XI: Head turning and shoulder shrug are intact  MOTOR: Spastic left hemiparesis, no antigravity movement of left upper extremity proximal and distal muscles, limited range of motion of left shoulder, complains  of left shoulder pain with passive stretch to 90 degrees, tightness of left pectoralis major, okay range of motion of left elbow, able to straighten out left fingers with passive stretch, and to stay in left shoulder abduction, arm pronation, elbow flexion, and position.  Right hip flexion 4, knee extension 5, knee flexion 4 Profound right ankle weakness, right ankle dorsiflexion flexion 1, plantar flexion 2  Left hip flexion 4, left knee extension 3, left knee flexion 4, left ankle dorsiflexion 3, plantarflexion 3  REFLEXES: Hyperreflexia of bilateral upper and lower extremity, bilateral Babinski signs  SENSORY: Intact to light touch, pinprick   COORDINATION: There is no trunk or limb dysmetria noted.  GAIT/STANCE: Deferred   DIAGNOSTIC DATA (LABS, IMAGING, TESTING) - I reviewed patient records, labs, notes, testing and imaging myself where available.   ASSESSMENT AND PLAN  Charman Blasco is a 59 y.o. 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  Proceed with MRI of lumbar to rule out lumbar radiculopathy   Marcial Pacas, M.D. Ph.D.  [facility] [address] [place], [place] [address] Ph: [phone] Fax: [phone]  CC: Referring Provider

## 2019-12-01 ENCOUNTER — Other Ambulatory Visit: Payer: Self-pay | Admitting: Internal Medicine

## 2019-12-01 MED FILL — LANTUS SOLOSTAR 100 UNITS/M: 100 | 45 days supply | Qty: 15 | Fill #0

## 2019-12-11 MED FILL — ACCU-CHEK GUIDE TEST STRIP: 30 days supply | Qty: 100 | Fill #2

## 2020-01-04 ENCOUNTER — Other Ambulatory Visit: Payer: Self-pay

## 2020-01-04 ENCOUNTER — Ambulatory Visit (INDEPENDENT_AMBULATORY_CARE_PROVIDER_SITE_OTHER): Payer: Medicare Other | Admitting: Neurology

## 2020-01-04 ENCOUNTER — Encounter: Payer: Self-pay | Admitting: Neurology

## 2020-01-04 ENCOUNTER — Telehealth: Payer: Self-pay | Admitting: Neurology

## 2020-01-04 VITALS — BP 112/67 | HR 93 | Temp 97.4°F

## 2020-01-04 DIAGNOSIS — G8114 Spastic hemiplegia affecting left nondominant side: Secondary | ICD-10-CM

## 2020-01-04 DIAGNOSIS — G8110 Spastic hemiplegia affecting unspecified side: Secondary | ICD-10-CM

## 2020-01-04 DIAGNOSIS — I6300 Cerebral infarction due to thrombosis of unspecified precerebral artery: Secondary | ICD-10-CM

## 2020-01-04 NOTE — Telephone Encounter (Signed)
Noted, thank you. DWD  

## 2020-01-04 NOTE — Progress Notes (Signed)
PATIENT: Christina Evans DOB: [birth year]  Chief Complaint  Patient presents with  . Left Spastic Hemiparesis    Xeomin 200 units x 2 vials - sample medication - 1st injection     HISTORICAL  Christina Evans is a 59 year old female, accompanied by her daughter, seen in request by Dr. Leonie Man for evaluation of EMG guided botulism toxin injection for spastic left hemiparesis  I have reviewed and summarized the referring note from the referring physician.  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  She does have vascular risk factor of diabetes, insulin-dependent, hyperlipidemia,  She was found confused, not at her baseline when her daughter visited her on September 13, 2019, was treated at [facility], I personally reviewed MRI moderate-sized acute stroke ACA territory, right MCA infarction.  Cerebral catheter angiogram done on 09/15/2019 by Dr. 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  The right internal carotid artery had 50% stenosis in the supraclinoid segment and the right MCA also had severe stenosis in the M1 segment  Despite rehabilitation, she was not able to bounce back to her baseline, now she lives with her daughter, wheelchair-bound, no longer ambulatory, confused, also complains of significant low back pain, developed worsening urinary and bowel incontinence  UPDATE Jan 04 2020:  She return for first electrical stimulation guided Xeomin injection, we used 400 units of Xeomin, injection was focused on  this for spastic left upper extremity,  REVIEW OF SYSTEMS: Full 14 system review of systems performed and notable only for as above All other review of systems were negative.  ALLERGIES: Allergies  Allergen Reactions  . Tape Itching and Other (See Comments)    Causes a lot of scratching, also    HOME MEDICATIONS: Current Outpatient Medications  Medication Sig Dispense Refill  . acetaminophen (TYLENOL) 650 MG CR tablet Take 650 mg by mouth every 6 (six) hours as needed for pain.    Marland Kitchen albuterol (PROAIR HFA) 108 (90 Base) MCG/ACT inhaler Inhale 2 puffs into the lungs 4 (four) times daily as needed for wheezing or shortness of breath. 18 g 0  . aspirin 325 MG EC tablet Take 1 tablet (325 mg total) by mouth daily. 30 tablet 0  . atorvastatin (LIPITOR) 80 MG tablet Take 1 tablet (80 mg total) by mouth daily at 6 PM. 30 tablet 0  . blood glucose meter kit and supplies Dispense based on patient and insurance preference. Use up to four times daily as directed. (FOR ICD-9 250.00, 250.01). 1 each 0  . citalopram (CELEXA) 40 MG tablet Take 1 tablet (40 mg total) by mouth daily. 30 tablet 0  . clopidogrel (PLAVIX) 75 MG tablet Take 1 tablet (75 mg total) by mouth daily. 30 tablet 0  . glucose blood test strip Use as instructed 100 each 12  . IncobotulinumtoxinA (XEOMIN IM) Inject into the muscle every 3 (three) months.    . insulin aspart (NOVOLOG FLEXPEN) 100 UNIT/ML FlexPen Inject 15 Units into the skin 3 (three) times daily with meals. 15 mL 0  . Insulin Glargine (LANTUS SOLOSTAR) 100 UNIT/ML Solostar Pen Inject  33 Units into the skin daily. (Patient taking differently: Inject 64 Units into the skin daily. ) 15 mL 0  . insulin lispro (HUMALOG) 100 UNIT/ML injection Inject 0.15 mLs (15 Units total) into the skin 3 (three) times daily before meals. 10 mL 2  . Lancets (ACCU-CHEK SOFT TOUCH) lancets Use as instructed 100 each 12  . metFORMIN (GLUCOPHAGE-XR) 500 MG 24 hr tablet Take 2 tablets (1,000 mg  total) by mouth 2 (two) times daily. 120 tablet 0  . omega-3 acid ethyl esters (LOVAZA) 1 g capsule Take 1 capsule (1 g total) by mouth 2 (two) times daily. 60 capsule 0   No current facility-administered medications for this visit.    PAST MEDICAL HISTORY: Past Medical History:  Diagnosis Date  . Asthma   . CVA (cerebral vascular accident) ([place])   . Diabetes mellitus without complication ([place])     PAST SURGICAL HISTORY: Past Surgical History:  Procedure Laterality Date  . IR ANGIO INTRA EXTRACRAN SEL COM CAROTID INNOMINATE BILAT MOD SED  09/15/2019  . IR ANGIO VERTEBRAL SEL VERTEBRAL UNI L MOD SED  09/15/2019    FAMILY HISTORY: Family History  Problem Relation Age of Onset  . Hypertension Mother   . Hypertension Father     SOCIAL HISTORY: Social History   Socioeconomic History  . Marital status: Single    Spouse name: Not on file  . Number of children: Not on file  . Years of education: Not on file  . Highest education level: Not on file  Occupational History  . Not on file  Tobacco Use  . Smoking status: Former Research scientist (life sciences)  . Smokeless tobacco: Never Used  . Tobacco comment: QUIT IN 2008  Substance and Sexual Activity  . Alcohol use: Yes    Comment: RARE  . Drug use: No  . Sexual activity: Not on file  Other Topics Concern  . Not on file  Social History Narrative   Lives at home with significant other (not married).     Stays in close communication with 2 of her children who live in [place].     She has multiple other children who currently live in [place].     Former smoker, quit in 2008.    Right handed   No caffeine   Social Determinants of Health   Financial Resource Strain:   . Difficulty of Paying Living Expenses: Not on file  Food Insecurity:   . Worried About Charity fundraiser in the Last Year: Not on file  . Ran Out of Food in the Last Year: Not on file  Transportation Needs:   . Lack of Transportation (Medical): Not on file  . Lack of  Transportation (Non-Medical): Not on file  Physical Activity:   . Days of Exercise per Week: Not on file  . Minutes of Exercise per Session: Not on file  Stress:   . Feeling of Stress : Not on file  Social Connections:   . Frequency of Communication with Friends and Family: Not on file  . Frequency of Social Gatherings with Friends and Family: Not on file  . Attends Religious Services: Not on file  . Active Member of Clubs or Organizations: Not on file  . Attends Archivist Meetings: Not on file  . Marital Status: Not on file  Intimate Partner Violence:   . Fear of Current or Ex-Partner: Not on file  . Emotionally Abused: Not on file  . Physically Abused: Not on file  .  Sexually Abused: Not on file     PHYSICAL EXAM   Vitals:   01/04/20 0851  BP: 112/67  Pulse: 93  Temp: (!) 97.4 F (36.3 C)    Not recorded      There is no height or weight on file to calculate BMI.  PHYSICAL EXAMNIATION: Spastic left hemiparesis, no antigravity movement of left upper extremity proximal and distal muscles, limited range of motion of left shoulder, complains of left shoulder pain with passive stretch to 90 degrees, tightness of left pectoralis major, okay range of motion of left elbow, able to straighten out left fingers with passive stretch, tend to stay in left shoulder adduction, arm pronation, elbow flexion and wrist/finger flexed position.  Left hip flexion 4, left knee extension 3, left knee flexion 4, left ankle dorsiflexion 3, plantarflexion 3   She was able to get up from seated position with assistant, spastic left hemicircumferential gait, tends to hold left knee extension,   DIAGNOSTIC DATA (LABS, IMAGING, TESTING) - I reviewed patient records, labs, notes, testing and imaging myself where available.   ASSESSMENT AND PLAN  Christina Evans is a 59 y.o. 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 Ph.D.  [facility] [address] [place], [place] [address] Ph: [phone] Fax: [phone]  CC: Referring Provider

## 2020-01-04 NOTE — Progress Notes (Signed)
**  Xeomin 200 units x 2 vials, NDC 0259-1620-10, Lot [id number], Exp 10/2020, sample medication.//mck,rn**

## 2020-01-04 NOTE — Telephone Encounter (Signed)
Can we change her xeomin request to 600 units, thanks,

## 2020-01-08 MED FILL — ACCU-CHEK GUIDE TEST STRIP: 30 days supply | Qty: 100 | Fill #3

## 2020-01-22 MED FILL — LANTUS SOLOSTAR 100 UNITS/M: 100 | 45 days supply | Qty: 15 | Fill #0

## 2020-01-24 MED FILL — metFORMIN HCL 1000 MG TABS: 1000 | 90 days supply | Qty: 180 | Fill #1

## 2020-02-07 ENCOUNTER — Encounter: Payer: Self-pay | Admitting: Occupational Therapy

## 2020-02-07 ENCOUNTER — Ambulatory Visit: Payer: Medicare Other | Attending: Neurology | Admitting: Occupational Therapy

## 2020-02-07 ENCOUNTER — Ambulatory Visit: Payer: Medicare Other | Admitting: Physical Therapy

## 2020-02-07 ENCOUNTER — Other Ambulatory Visit: Payer: Self-pay

## 2020-02-07 ENCOUNTER — Encounter: Payer: Self-pay | Admitting: Physical Therapy

## 2020-02-07 DIAGNOSIS — R2681 Unsteadiness on feet: Secondary | ICD-10-CM | POA: Diagnosis present

## 2020-02-07 DIAGNOSIS — M25622 Stiffness of left elbow, not elsewhere classified: Secondary | ICD-10-CM | POA: Diagnosis present

## 2020-02-07 DIAGNOSIS — M6281 Muscle weakness (generalized): Secondary | ICD-10-CM | POA: Insufficient documentation

## 2020-02-07 DIAGNOSIS — I69354 Hemiplegia and hemiparesis following cerebral infarction affecting left non-dominant side: Secondary | ICD-10-CM

## 2020-02-07 DIAGNOSIS — R2689 Other abnormalities of gait and mobility: Secondary | ICD-10-CM | POA: Insufficient documentation

## 2020-02-07 DIAGNOSIS — R262 Difficulty in walking, not elsewhere classified: Secondary | ICD-10-CM

## 2020-02-07 DIAGNOSIS — I69318 Other symptoms and signs involving cognitive functions following cerebral infarction: Secondary | ICD-10-CM

## 2020-02-07 DIAGNOSIS — M79602 Pain in left arm: Secondary | ICD-10-CM

## 2020-02-07 DIAGNOSIS — R29818 Other symptoms and signs involving the nervous system: Secondary | ICD-10-CM

## 2020-02-07 DIAGNOSIS — R293 Abnormal posture: Secondary | ICD-10-CM | POA: Diagnosis present

## 2020-02-07 MED FILL — CITALOPRAM HBR 40 MG TABLET: 40 | 90 days supply | Qty: 90 | Fill #1

## 2020-02-07 MED FILL — CLOPIDOGREL 75 MG TABLET: 75 | 90 days supply | Qty: 90 | Fill #1

## 2020-02-07 MED FILL — ACCU-CHEK GUIDE TEST STRIP: 30 days supply | Qty: 100 | Fill #4

## 2020-02-07 NOTE — Addendum Note (Signed)
Addended by: Drake Leach on: 02/07/2020 12:10 PM   Modules accepted: Orders

## 2020-02-07 NOTE — Therapy (Signed)
[facility] [address] [place], [place], [address] Phone: [phone]   Fax:  [phone]  Occupational Therapy Evaluation  Patient Details  Name: Christina Evans MRN: [id number] Date of Birth: 13-Nov-1961 Referring Provider (OT): Dr. Leonie Man   Encounter Date: 02/07/2020  OT End of Session - 02/07/20 1149    Visit Number  1    Number of Visits  24    Date for OT Re-Evaluation  05/21/20    Authorization Type  [employer] medicare and medicaid as secondary. Pt will need PN every 10th visit    Authorization Time Period  90 days - 05/21/2020    Authorization - Visit Number  1    Authorization - Number of Visits  10    OT Start Time  0846    OT Stop Time  0928    OT Time Calculation (min)  42 min    Activity Tolerance  Patient tolerated treatment well       Past Medical History:  Diagnosis Date  . Asthma   . CVA (cerebral vascular accident) ([place])   . Diabetes mellitus without complication [facility])     Past Surgical History:  Procedure Laterality Date  . IR ANGIO INTRA EXTRACRAN SEL COM CAROTID INNOMINATE BILAT MOD SED  09/15/2019  . IR ANGIO VERTEBRAL SEL VERTEBRAL UNI L MOD SED  09/15/2019    There were no vitals filed for this visit.  Subjective Assessment - 02/07/20 0850    Subjective   I perfer my main language be listed as English not Spanish    Patient is accompanied by:  Family member   dtr [name]   Pertinent History  Pt s/p recent R ACA CVA 09/13/19. 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    Currently in Pain?  No/denies        [facility] OT Assessment - 02/07/20 0001      Assessment   Medical Diagnosis  R ACA CVA, late effects of R MCA CVA    Referring Provider (OT)  Dr. Leonie Man    Onset  Date/Surgical Date  11/21/19   date of referral   Hand Dominance  Right    Prior Therapy  HHPT, OT and ST came 2 times.  Pt had extensive therapy with first stroke      Precautions   Precautions  Fall      Restrictions   Weight Bearing Restrictions  No      Balance Screen   Has the patient fallen in the past 6 months  No   Pt has PT referral     Home  Environment   Family/patient expects to be discharged to:  Private residence    Living Arrangements  Children   dtr and son in law   Available Help at Discharge  Available 24 hours/day    Type of Home  Aartment    Home Access  Level entry    Bathroom Shower/Tub  Tub/Shower unit    [employer]  Standard    Additional Comments  transfer tub bench, hand held shower, wheelchair, grab bar in shower, handles around toilet,  hemiwalker      Prior Function   Level of Independence  Independent    Vocation  On disability    Leisure  like to joke alot,       ADL   Eating/Feeding  Minimal assistance   for cutting food   Grooming  Maximal assistance   pt washes face only, dtr does denture and hair   Upper Body Bathing  Minimal assistance   hair and back   Lower Body Bathing  Modified independent    Upper Body Dressing  + 1 Total assistance    Lower Body Dressing  +1 Total aassistance    Toilet Transfer  Minimal assistance    Toileting - Clothing Manipulation  + 1 Total assistance    Toileting -  Hygiene  + 1 Total assistance    Tub/Shower Transfer  Minimal assistance   assist with leg only     IADL   Shopping  Needs to be accompanied on any shopping trip    Light Housekeeping  Performs light daily tasks such as dishwashing, bed making   pt folds her own clothes   Meal Prep  Needs to have meals prepared and served    [employer] on family or friends for transportation    Medication Management  Is not capable of dispensing or managing own medication    Financial Management  Requires assistance      Mobility    Mobility Status  Needs assist      Written Expression   Dominant Hand  Right      Vision - History   Baseline Vision  Other (comment)   reading and one for watching tv     Vision Assessment   Comment  Pt states vision at times is blurry, dtr said recent eye appt pt may need surgery      Activity Tolerance   Activity Tolerance  Tolerate 30+ min activity without fatigue   pt can do morning ADL's and then needs a rest     Cognition   Overall Cognitive Status  Impaired/Different from baseline    Attention  Selective;Sustained    Memory  Impaired    Awareness  Impaired    Problem Solving  Impaired    Executive Function  Reasoning;Decision Making;Initiating;Self Monitoring;Self Correcting    Cognition Comments  Dtr reports pt no longer initiates to go the bathroom.       Posture/Postural Control   Posture/Postural Control  Postural limitations      Sensation   Light Touch  Appears Intact    Hot/Cold  Impaired by gross assessment    Proprioception  Appears Intact    Additional Comments  For LUE      Coordination   Gross Motor Movements are Fluid and Coordinated  No    Fine Motor Movements are Fluid and Coordinated  No    Other  Pt unable to complete standardized tests due to limited movement      Praxis   Praxis  --   to be further assessed functionally     Tone   Assessment Location  Left Upper Extremity      ROM / Strength   AROM / PROM / Strength  AROM;PROM;Strength      AROM   Overall AROM   Deficits    Overall AROM Comments  LUE flexion to 50* with moderate substitutions, 45* abduction with min substitutions, pt is abel to bring hand to mouth for gross movement however no active elbow extension, no active forearm or wrist movement, pt does have isolated finger extension for grasp however no active finger extension. No active ER.  PROM   Overall PROM   Deficits    Overall PROM Comments  LUE flexion to 100* and abduction to 50* with pain at end range (due to  cognitive deficits pt unable to rate however faces would indicate "hurts a little more"), ER -30*, all other WFL's      Strength   Overall Strength  Unable to assess    Overall Strength Comments  due to significant spasticity - MMT not appropriate. Pt moves in Brunstromm stage III in arm and hand.       Hand Function   Right Hand Gross Grasp  Functional    Comment  pt has isolated finger flexion in LUE however has no active extension and pt does not use LUE functionally      LUE Tone   LUE Tone  Hypertonic;Modified Ashworth      LUE Tone   Modified Ashworth Scale for Grading Hypertonia LUE  Considerable increase in muschle tone, passive movement difficult                        OT Short Term Goals - 02/07/20 1129      OT SHORT TERM GOAL #1   Title  Pt and dtr will be mod  I with HEP to address ROM for RUE - 03/20/2020    Status  New      OT SHORT TERM GOAL #2   Title  Pt and dtr will verbalize understanding for AE for cutting food on plate    Status  New      OT SHORT TERM GOAL #3   Title  Pt will be mod I with washing face and set up for bushing teeth.    Status  New      OT SHORT TERM GOAL #4   Title  Pt will be mod a for UB dressing    Status  New      OT SHORT TERM GOAL #5   Title  Pt will be mod a for LB dressing    Status  New      Additional Short Term Goals   Additional Short Term Goals  Yes      OT SHORT TERM GOAL #6   Title  Pt will require mod a to hike pants    Status  New      OT SHORT TERM GOAL #7   Title  Pt and dtr will verbalize understanding of toileting schedule to address incontinence    Status  New      OT SHORT TERM GOAL #8   Title  Pt and dtr will be mod with splint wear and care (resting hand splint at night)    Status  New        OT Long Term Goals - 02/07/20 1133      OT LONG TERM GOAL #1   Title  Pt and dtr will be mod I with upgraded home activities program to include focus ADL's, transitional movements and  balance. - 05/01/2020    Status  New      OT LONG TERM GOAL #2   Title  Pt will be supervision for toilet transfers    Status  New      OT LONG TERM GOAL #3   Title  Pt will be no more than min a for hiking pants and toilet hygiene    Status  New      OT LONG TERM GOAL #4  Title  Pt will tolerate 110* of L shoulder flexion and 90* abduction during HEP for ease with ADL's with more than " a little " pain based on faces.    Status  New      OT LONG TERM GOAL #5   Title  Pt will be supervision for simple snack prep    Status  New      Long Term Additional Goals   Additional Long Term Goals  Yes      OT LONG TERM GOAL #6   Title  Pt and dtr will complete FOTO outcome scale    Status  New            Plan - 02/07/20 1138    Clinical Impression Statement  Pt is a 59 year old female diagnosed with subacute R ACA CVA on 09/13/2019.  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  Pt is s/p botox for LUE on 01/04/20. After initial stroke pt went through extensive therapy and was ultimate able to live mod I including driving.  Pt now lives full time with dtr who cares for pt 24/7.  Pt today presents with the following deficits that impact independent functioning:  worsening L spastic hemiplegia, decreased ROM of LUE, pain in L shoulder, abnormal postural alignment and and control, decreased balance and fucntional mobility, impaired cognition.  Pt  has only had limited HH therapies for this recent stroke. Pt will benefit from skilled OT to address these deficts and maximize independent functioning.  Pt and dtr in agreement.    OT Occupational Profile and History  Detailed Assessment- Review of Records and additional review of physical, cognitive, psychosocial history related to current functional performance    Occupational performance deficits (Please  refer to evaluation for details):  ADL's;IADL's;Work;Leisure;Social Participation    Body Structure / Function / Physical Skills  ADL;Balance;Continence;Endurance;GMC;IADL;Mobility;Sensation;ROM;Pain;Strength;Tone;UE functional use    Rehab Potential  Good   for goals   Clinical Decision Making  Multiple treatment options, significant modification of task necessary    Comorbidities Affecting Occupational Performance:  Presence of comorbidities impacting occupational performance    Comorbidities impacting occupational performance description:  see above    Modification or Assistance to Complete Evaluation   Max significant modification of tasks or assist is necessary to complete    OT Frequency  2x / week    OT Duration  12 weeks    OT Treatment/Interventions  Self-care/ADL training;Aquatic Therapy;Electrical Stimulation;Ultrasound;Moist Heat;Therapeutic exercise;Neuromuscular education;Manual Therapy;Functional Mobility Training;DME and/or AE instruction;Passive range of motion;Patient/family education;Cognitive remediation/compensation;Therapeutic activities;Splinting;Balance training    Plan  review OT goals and POC, fabrication of resting hand splint, manual therapy to begin to address LUE ROM/pain, NMR for trunk/LUE/postural alignment and control, functional mobility, focus on ADL's as possible    Recommended Other Services  Speech therapy eval and tx - order has been requested    Consulted and Agree with Plan of Care  Patient;Family member/caregiver    Family Member Consulted  dtr       Patient will benefit from skilled therapeutic intervention in order to improve the following deficits and impairments:   Body Structure / Function / Physical Skills: ADL, Balance, Continence, Endurance, GMC, IADL, Mobility, Sensation, ROM, Pain, Strength, Tone, UE functional use       Visit Diagnosis: Spastic hemiplegia of left nondominant side as late effect of cerebral infarction (HCC) - Plan: Ot  plan  of care cert/re-cert  Abnormal posture - Plan: Ot plan of care cert/re-cert  Unsteadiness on feet - Plan: Ot plan of care cert/re-cert  Stiffness of left upper arm joint - Plan: Ot plan of care cert/re-cert  Pain in left arm - Plan: Ot plan of care cert/re-cert  Other symptoms and signs involving cognitive functions following cerebral infarction - Plan: Ot plan of care cert/re-cert    Problem List Patient Active Problem List   Diagnosis Date Noted  . Cerebrovascular accident (CVA) due to thrombosis of precerebral artery (HCC) 11/30/2019  . 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 Spastic hemiparesis affecting nondominant side (HCC) 11/30/2019  . Chronic bilateral low back pain with sciatica 11/30/2019  . Metatarsal fracture 09/22/2019  . Depression 09/22/2019  . Asthma 09/22/2019  . Palliative care by specialist   . Goals of care, counseling/discussion   . Encephalopathy   . Acute CVA (cerebrovascular accident) (HCC) 09/14/2019  . Acute arterial ischemic stroke, multifocal, anterior circulation, right (HCC) 09/14/2019  . Severe comorbid illness   . AMS (altered mental status) 09/13/2019  . 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  . Hyperlipidemia 09/02/2017  . Hypertension 09/02/2017  . Chest pain 09/02/2017    Norton Pastel, OTR/L 02/07/2020, 11:56 AM  [facility] [address] [place], [place], [address] Phone: [phone]   Fax:  [phone]  Name: Christina Evans MRN: [id number] Date of Birth: 1961/08/03

## 2020-02-07 NOTE — Therapy (Addendum)
[facility] [address] [place], [place], [address] Phone: [phone]   Fax:  [phone]  Physical Therapy Evaluation  Patient Details  Name: Christina Evans MRN: [id number] Date of Birth: 1961-05-25 Referring Provider (PT): Garvin Fila, MD   Encounter Date: 02/07/2020  PT End of Session - 02/07/20 1026    Visit Number  1    Number of Visits  25   Date for PT Re-Evaluation  05/07/20   written for 60 day POC   Authorization Type  [employer] Medicare    PT Start Time  0930    PT Stop Time  1014    PT Time Calculation (min)  44 min    Equipment Utilized During Treatment  Gait belt    Activity Tolerance  Patient tolerated treatment well    Behavior During Therapy  [facility] for tasks assessed/performed       Past Medical History:  Diagnosis Date  . Asthma   . CVA (cerebral vascular accident) ([place])   . Diabetes mellitus without complication [facility])     Past Surgical History:  Procedure Laterality Date  . IR ANGIO INTRA EXTRACRAN SEL COM CAROTID INNOMINATE BILAT MOD SED  09/15/2019  . IR ANGIO VERTEBRAL SEL VERTEBRAL UNI L MOD SED  09/15/2019    There were no vitals filed for this visit.   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 MRI brain with moderate sized acute to early subacute left ACA territory infarct andlarge chronic right MCA territory infarct. Went to SNF at [place] for 2 weeks. Had home health therapies for 2-3 months with PT and OT. Only had speech therapy 2 times. Learned transfers and gait at home with home health. Can transfer on her  home, but has not been doing any walking. Uses a hemiwalker for standing. Did not receive an AFO after her 2nd stroke. Pt does not remember that she had a 2nd stroke. Has recently received Botox in LUE.    Patient is accompained by:  Family member   daughter Christina Evans   Pertinent History  PMH significant for large right MCA CVA, HTN, T2DM, HLD, hx of Left foot fifth metatarsal fracture with osteopenia, asthma.    Limitations  Standing;Walking    How long can you stand comfortably?  1 minute (when daughter is changing her when she is wearing diapers).    How long can you walk comfortably?  a few feet.    Diagnostic tests  MRI brain with moderate sized acute to early subacute left ACA territory infarct andlarge chronic right MCA territory infarct.    Patient Stated Goals  wants to get better, wants to be independent.    Currently in Pain?  No/denies         [facility] PT Assessment - 02/07/20 0942      Assessment   Medical Diagnosis  R ACA CVA, late effects of R MCA CVA    Referring Provider (PT)  Garvin Fila, MD    Onset Date/Surgical Date  09/13/19   date of 2nd stroke   Hand Dominance  Right    Prior Therapy  HHPT,  OT and ST came 2 times.  Pt had extensive therapy with first stroke      Precautions   Precautions  Fall      Restrictions   Weight Bearing Restrictions  No      Balance Screen   Has the patient fallen in the past 6 months  No    Has the patient had a decrease in activity level because of a fear of falling?   Yes    Is the patient reluctant to leave their home because of a fear of falling?   Yes      Home Environment   Living Environment  Private residence    Living Arrangements  Children;Other (Comment)   daughter and daughter's bf Christina Evans   Available Help at Discharge  Family    Type of Home  Apartment    Home Access  Level entry    Home Layout  One level    Home Equipment  Wheelchair - manual;Other (comment);Shower seat;Tub bench;Bedside commode;Hospital bed    hemi-walker,    Additional Comments  has been in [place] now for about 3.5 years      Prior Function   Level of Independence  Independent    Vocation  On disability    Leisure  like to joke alot, used to like shopping at [employer]       Cognition   Memory  Impaired    Awareness  Impaired    Problem Solving  Impaired      Sensation   Light Touch  Appears Intact    Proprioception  Appears Intact   B ankles   Additional Comments  R foot goes numb       Coordination   Gross Motor Movements are Fluid and Coordinated  No    Heel Shin Test  due to weakness B      Posture/Postural Control   Posture/Postural Control  Postural limitations    Postural Limitations  Weight shift right;Posterior pelvic tilt;Increased thoracic kyphosis    Posture Comments  R shoulder elevated      Tone   Assessment Location  Left Upper Extremity;Left Lower Extremity      ROM / Strength   AROM / PROM / Strength  PROM;Strength      PROM   Overall PROM Comments  decr PROM of L ankle DF, decr PROM and AROM of L hamstring       Strength   Strength Assessment Site  Hip;Knee;Ankle    Right/Left Hip  Right;Left    Right Hip Flexion  4+/5    Left Hip Flexion  3-/5    Right/Left Knee  Right;Left    Right Knee Flexion  5/5    Right Knee Extension  5/5    Left Knee Flexion  2/5    Left Knee Extension  3-/5   decr ROM due to spasticity/tightness   Right/Left Ankle  Right;Left    Right Ankle Dorsiflexion  4+/5    Left Ankle Dorsiflexion  1/5      Bed Mobility   Bed Mobility  Supine to Sit;Sit to Supine;Rolling Right    Rolling Right  Independent    Supine to Sit  Independent    Sit to Supine  Independent      Transfers   Transfers  Sit to Stand;Stand to Sit    Sit to Stand  4: Min guard   with hemiwalker from mat table   Stand to Sit  4: Min guard   with  hemiwalker   Comments  stand step transfer from w/c > mat table: close min guard for safety.       Ambulation/Gait   Ambulation/Gait  Yes     Ambulation/Gait Assistance  4: Min assist;4: Min guard    Ambulation/Gait Assistance Details  pt with decreased weight shift towards LLE, w/c follow for safety    Ambulation Distance (Feet)  25 Feet    Assistive device  Hemi-walker    Gait Pattern  Step-to pattern;Step-through pattern;Decreased arm swing - left;Decreased stance time - left;Decreased hip/knee flexion - left;Decreased weight shift to left;Decreased dorsiflexion - left;Left circumduction;Left hip hike;Narrow base of support;Poor foot clearance - left    Ambulation Surface  Level;Indoor    Pre-Gait Activities  possible leg length discrepancy with LLE, or possibly due to increased tone in LLE       LUE Tone   LUE Tone  Hypertonic      LLE Tone   LLE Tone  Hypertonic                Objective measurements completed on examination: See above findings.              PT Education - 02/07/20 1026    Education Details  clinical findings, POC    Person(s) Educated  Patient;Child(ren)    Methods  Explanation    Comprehension  Verbalized understanding       PT Short Term Goals - 02/07/20 1141      PT SHORT TERM GOAL #1   Title  Pt will undergo further assessment of L AFO for gait, mobility, and transfers. ALL STGS DUE 03/13/20.    Time  5   due to delay in scheduling   Period  Weeks    Status  New    Target Date  03/13/20      PT SHORT TERM GOAL #2   Title  Pt will perform stand step transfers with supervision in order to decr caregiver burder.    Time  5    Period  Weeks    Status  New      PT SHORT TERM GOAL #3   Title  Pt will tolerate static standing for at least 3 minutes with single UE support in order to improve tolerance for ADLs.    Time  5    Period  Weeks    Status  New      PT SHORT TERM GOAL #4   Title  Pt will ambulate at least 22' with hemiwalker and min guard in order to improve functional mobility.    Time  5    Period  Weeks    Status  New      PT SHORT TERM GOAL #5    Title  Pt and pt's daughter will be independent with initial HEP for weight bearing/lower extremity stretching.    Time  5    Period  Weeks    Status  New        PT Long Term Goals - 02/07/20 1144      PT LONG TERM GOAL #1   Title  Pt will perform stand step transfers with no AD with mod I in order to decr caregiver burden.    Time  9    Period  Weeks    Status  New    Target Date  04/10/20      PT LONG TERM GOAL #2   Title  Pt will tolerate static standing for  at least 5 minutes with single UE vs. no UE support in order to improve tolerance for ADLs.    Time  9    Period  Weeks    Status  New      PT LONG TERM GOAL #3   Title  Pt will ambulate at least 200' with hemiwalker vs. LRAD and supervision in order to improve functional houeshold mobility.    Time  9    Period  Weeks    Status  New      PT LONG TERM GOAL #4   Title  Pt will perform at least 5 sit <> stand transfers from mat table and standard height chair with mod I with no UE support vs. single UE support in order to demo improved functional transfers    Time  9    Period  Weeks    Status  New      PT LONG TERM GOAL #5   Title  Pt and pt's daughter will be independent with final HEP for weight bearing/lower extremity stretching/strengthening.    Time  9    Period  Weeks    Status  New             Plan - 02/07/20 1029    Clinical Impression Statement  Patient is a 59 year old female referred to Neuro OPPT for evaluation s/p CVA(subacute left ACA territory infarct and large chronic right MCA territory infarct).  Pt with spastic L hemiparesis following her first stroke in 2008 - received prolong rehabilitation and made significant recovery. She was able to drive and ambulate without assistance. Had another stroke October 21,2020 - MRI brain showed moderate sized acute to early subacute left ACA territory infarct. Pt went to a SNF for 2 weeks and received home health PT/OT for about 2-3 months (only received 2  sessions of speech therapy). Pt only able to stand approx. 1 minute with hemiwalker and has not been doing much walking at home with hemi-walker. Pt's PMH is significant for: large right MCA (2008) CVA, HTN, T2DM, HLD, hx of Left foot fifth metatarsal fracture with osteopenia, asthma.. The following deficits were present during the exam: incr tone in LLE and LUE, decreased L>RLE strength, impaired sensation, decr coordination, cognitive deficits, decr LLE AROM/PROM, L spastic hemiparesis. Pt would benefit from skilled PT to address these impairments and functional limitations to maximize functional mobility independence.    Personal Factors and Comorbidities  Comorbidity 3+;Past/Current Experience;Time since onset of injury/illness/exacerbation;Behavior Pattern    Comorbidities  PMH significant for large right MCA CVA, HTN, T2DM, HLD, hx of Left foot fifth metatarsal fracture with osteopenia, asthma.    Examination-Activity Limitations  Stand;Locomotion Level;Transfers;Toileting;Continence;Dressing    Examination-Participation Restrictions  Community Activity    Stability/Clinical Decision Making  Evolving/Moderate complexity    Clinical Decision Making  Moderate    Rehab Potential  Good    PT Frequency  2x / week    PT Duration  12 weeks    PT Treatment/Interventions  ADLs/Self Care Home Management;Aquatic Therapy;Electrical Stimulation;DME Instruction;Gait training;Functional mobility training;Neuromuscular re-education;Balance training;Therapeutic exercise;Therapeutic activities;Patient/family education;Orthotic Fit/Training;Passive range of motion;Energy conservation    PT Next Visit Plan  assess for leg length discrepancy in supine, initial stretching program for home, assess for proper AFO (pt with incr extension and decr foot clearance during gait due to spasticity).    Recommended Other Services  speech therapy eval.    Consulted and Agree with Plan of Care  Patient;Family member/caregiver  Patient will benefit from skilled therapeutic intervention in order to improve the following deficits and impairments:  Abnormal gait, Decreased activity tolerance, Decreased coordination, Decreased balance, Decreased cognition, Decreased endurance, Decreased knowledge of use of DME, Decreased range of motion, Decreased strength, Difficulty walking, Impaired tone, Impaired UE functional use, Postural dysfunction, Impaired sensation  Visit Diagnosis: Hemiplegia and hemiparesis following cerebral infarction affecting left non-dominant side (HCC)  Unsteadiness on feet  Other symptoms and signs involving the nervous system  Muscle weakness (generalized)  Difficulty in walking, not elsewhere classified  Other abnormalities of gait and mobility     Problem List Patient Active Problem List   Diagnosis Date Noted  . Cerebrovascular accident (CVA) due to thrombosis of precerebral artery (HCC) 11/30/2019  . 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 Spastic hemiparesis affecting nondominant side (HCC) 11/30/2019  . Chronic bilateral low back pain with sciatica 11/30/2019  . Metatarsal fracture 09/22/2019  . Depression 09/22/2019  . Asthma 09/22/2019  . Palliative care by specialist   . Goals of care, counseling/discussion   . Encephalopathy   . Acute CVA (cerebrovascular accident) (HCC) 09/14/2019  . Acute arterial ischemic stroke, multifocal, anterior circulation, right (HCC) 09/14/2019  . Severe comorbid illness   . AMS (altered mental status) 09/13/2019  . 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  . Hyperlipidemia 09/02/2017  . Hypertension 09/02/2017  . Chest pain 09/02/2017    Drake Leach, PT, DPT  02/07/2020, 11:53 AM  [facility] [address] [place], [place], [address] Phone: [phone]   Fax:  [phone]  Name:  Christina Evans MRN: [id number] Date of Birth: 20-Feb-1961

## 2020-02-12 ENCOUNTER — Encounter: Payer: Self-pay | Admitting: Occupational Therapy

## 2020-02-12 ENCOUNTER — Ambulatory Visit: Payer: Medicare Other | Admitting: Occupational Therapy

## 2020-02-12 ENCOUNTER — Other Ambulatory Visit: Payer: Self-pay

## 2020-02-12 DIAGNOSIS — R293 Abnormal posture: Secondary | ICD-10-CM

## 2020-02-12 DIAGNOSIS — I69318 Other symptoms and signs involving cognitive functions following cerebral infarction: Secondary | ICD-10-CM

## 2020-02-12 DIAGNOSIS — R29818 Other symptoms and signs involving the nervous system: Secondary | ICD-10-CM

## 2020-02-12 DIAGNOSIS — I69354 Hemiplegia and hemiparesis following cerebral infarction affecting left non-dominant side: Secondary | ICD-10-CM

## 2020-02-12 DIAGNOSIS — M79602 Pain in left arm: Secondary | ICD-10-CM

## 2020-02-12 DIAGNOSIS — R2681 Unsteadiness on feet: Secondary | ICD-10-CM

## 2020-02-12 DIAGNOSIS — M6281 Muscle weakness (generalized): Secondary | ICD-10-CM

## 2020-02-12 DIAGNOSIS — M25622 Stiffness of left elbow, not elsewhere classified: Secondary | ICD-10-CM

## 2020-02-12 NOTE — Therapy (Signed)
[facility] [address] [place], [place], [address] Phone: [phone]   Fax:  [phone]  Occupational Therapy Treatment  Patient Details  Name: Christina Evans MRN: [id number] Date of Birth: Jun 26, 1961 Referring Provider (OT): Dr. Leonie Man   Encounter Date: 02/12/2020  OT End of Session - 02/12/20 1244    Visit Number  2    Number of Visits  24    Date for OT Re-Evaluation  05/21/20    Authorization Type  [employer]HC medicare and medicaid as secondary. Pt will need PN every 10th visit    Authorization Time Period  90 days - 05/21/2020    Authorization - Visit Number  2    Authorization - Number of Visits  10    OT Start Time  1017    OT Stop Time  1100    OT Time Calculation (min)  43 min    Activity Tolerance  Patient tolerated treatment well       Past Medical History:  Diagnosis Date  . Asthma   . CVA (cerebral vascular accident) ([place])   . Diabetes mellitus without complication [facility])     Past Surgical History:  Procedure Laterality Date  . IR ANGIO INTRA EXTRACRAN SEL COM CAROTID INNOMINATE BILAT MOD SED  09/15/2019  . IR ANGIO VERTEBRAL SEL VERTEBRAL UNI L MOD SED  09/15/2019    There were no vitals filed for this visit.  Subjective Assessment - 02/12/20 1024    Subjective   I wake up with pain in my left shoulder    Patient is accompanied by:  Family member   dtr [name]   Pertinent History  Pt s/p recent R ACA CVA 09/13/19. 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    Currently in Pain?  No/denies                   OT Treatments/Exercises (OP) - 02/12/20 0001      ADLs   Overall ADLs  Addresed bed positioning in supine and sidelying on R side to address LUE support and reduce pain  and spasticity.  Pt and dtr verbalized understanding and pictures taken on pt's smart phone to facilitate carry over at home.      ADL Comments  Reviewed all goals and OT POC - pt and dtr in agreement and written copy provided.        Neurological Re-education Exercises   Other Exercises 1  Neuror re ed to address HEP to focus on ROM, AAROM, grading of movement, pain mgmt, and reducing spasticity. Dtr and pt able to return demonstrate all activities after instruction and practice and provided outline in written form. Pt able to actively participate in HEP; requires vc's for grading of effort to demonstrate isolated movement vs synergistic movement.               OT Education - 02/12/20 1235    Education Details  HEP for LUE for P/AAROM , grading of effort, pain mgmt, and reduction of spasticity. Also reviewed bed positioning to address pain in LUE    Person(s) Educated  Patient;Child(ren)    Methods  Explanation;Demonstration;Verbal cues;Handout    Comprehension  Verbalized understanding;Returned demonstration   may need further review with HEP for dtr  OT Short Term Goals - 02/12/20 1236      OT SHORT TERM GOAL #1   Title  Pt and dtr will be mod  I with HEP to address ROM for RUE - 03/20/2020    Status  On-going      OT SHORT TERM GOAL #2   Title  Pt and dtr will verbalize understanding for AE for cutting food on plate    Status  On-going      OT SHORT TERM GOAL #3   Title  Pt will be mod I with washing face and set up for bushing teeth.    Status  On-going      OT SHORT TERM GOAL #4   Title  Pt will be mod a for UB dressing    Status  On-going      OT SHORT TERM GOAL #5   Title  Pt will be mod a for LB dressing    Status  On-going      OT SHORT TERM GOAL #6   Title  Pt will require mod a to hike pants    Status  On-going      OT SHORT TERM GOAL #7   Title  Pt and dtr will verbalize understanding of toileting schedule to address incontinence    Status  On-going       OT SHORT TERM GOAL #8   Title  Pt and dtr will be mod with splint wear and care (resting hand splint at night)    Status  On-going        OT Long Term Goals - 02/12/20 1237      OT LONG TERM GOAL #1   Title  Pt and dtr will be mod I with upgraded home activities program to include focus ADL's, transitional movements and balance. - 05/01/2020    Status  On-going      OT LONG TERM GOAL #2   Title  Pt will be supervision for toilet transfers    Status  On-going      OT LONG TERM GOAL #3   Title  Pt will be no more than min a for hiking pants and toilet hygiene    Status  On-going      OT LONG TERM GOAL #4   Title  Pt will tolerate 110* of L shoulder flexion and 90* abduction during HEP for ease with ADL's with more than " a little " pain based on faces.    Status  On-going      OT LONG TERM GOAL #5   Title  Pt will be supervision for simple snack prep    Status  On-going      OT LONG TERM GOAL #6   Title  Pt and dtr will complete FOTO outcome scale    Status  On-going            Plan - 02/12/20 1240    Clinical Impression Statement  Pt and dtr in agreement with OT goals and POC.  Pt progressing toward goals    OT Occupational Profile and History  Detailed Assessment- Review of Records and additional review of physical, cognitive, psychosocial history related to current functional performance    Occupational performance deficits (Please refer to evaluation for details):  ADL's;IADL's;Work;Leisure;Social Participation    Body Structure / Function / Physical Skills  ADL;Balance;Continence;Endurance;GMC;IADL;Mobility;Sensation;ROM;Pain;Strength;Tone;UE functional use    Rehab Potential  Good    Clinical Decision Making  Multiple treatment options, significant modification of task necessary  Comorbidities Affecting Occupational Performance:  Presence of comorbidities impacting occupational performance    Comorbidities impacting occupational performance description:  see  above    Modification or Assistance to Complete Evaluation   Max significant modification of tasks or assist is necessary to complete    OT Frequency  2x / week    OT Duration  12 weeks    OT Treatment/Interventions  Self-care/ADL training;Aquatic Therapy;Electrical Stimulation;Ultrasound;Moist Heat;Therapeutic exercise;Neuromuscular education;Manual Therapy;Functional Mobility Training;DME and/or AE instruction;Passive range of motion;Patient/family education;Cognitive remediation/compensation;Therapeutic activities;Splinting;Balance training    Plan  fabrication of resting hand splint next session, manual therapy to begin to address LUE ROM/pain, NMR for trunk/LUE/postural alignment and control, functional mobility, focus on ADL's as possible    Consulted and Agree with Plan of Care  Patient;Family member/caregiver    Family Member Consulted  dtr       Patient will benefit from skilled therapeutic intervention in order to improve the following deficits and impairments:   Body Structure / Function / Physical Skills: ADL, Balance, Continence, Endurance, GMC, IADL, Mobility, Sensation, ROM, Pain, Strength, Tone, UE functional use       Visit Diagnosis: Unsteadiness on feet  Other symptoms and signs involving the nervous system  Muscle weakness (generalized)  Spastic hemiplegia of left nondominant side as late effect of cerebral infarction (HCC)  Abnormal posture  Stiffness of left upper arm joint  Pain in left arm  Other symptoms and signs involving cognitive functions following cerebral infarction    Problem List Patient Active Problem List   Diagnosis Date Noted  . Cerebrovascular accident (CVA) due to thrombosis of precerebral artery (HCC) 11/30/2019  . 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 Spastic hemiparesis affecting nondominant side (HCC) 11/30/2019  . Chronic bilateral low back pain with sciatica 11/30/2019  .  Metatarsal fracture 09/22/2019  . Depression 09/22/2019  . Asthma 09/22/2019  . Palliative care by specialist   . Goals of care, counseling/discussion   . Encephalopathy   . Acute CVA (cerebrovascular accident) (HCC) 09/14/2019  . Acute arterial ischemic stroke, multifocal, anterior circulation, right (HCC) 09/14/2019  . Severe comorbid illness   . AMS (altered mental status) 09/13/2019  . 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  . Hyperlipidemia 09/02/2017  . Hypertension 09/02/2017  . Chest pain 09/02/2017    Norton Pastel, OTR/L 02/12/2020, 12:47 PM  [facility] [address] [place], [place], [address] Phone: [phone]   Fax:  [phone]  Name: Akiva Josey MRN: [id number] Date of Birth: [birth year]

## 2020-02-14 ENCOUNTER — Encounter: Payer: Medicare Other | Admitting: Occupational Therapy

## 2020-02-15 ENCOUNTER — Encounter: Payer: Medicare Other | Admitting: Occupational Therapy

## 2020-02-15 ENCOUNTER — Ambulatory Visit: Payer: Medicare Other | Admitting: Occupational Therapy

## 2020-02-15 ENCOUNTER — Ambulatory Visit: Payer: Medicare Other | Admitting: Physical Therapy

## 2020-02-15 ENCOUNTER — Other Ambulatory Visit: Payer: Self-pay

## 2020-02-15 DIAGNOSIS — I69354 Hemiplegia and hemiparesis following cerebral infarction affecting left non-dominant side: Secondary | ICD-10-CM

## 2020-02-15 DIAGNOSIS — R29818 Other symptoms and signs involving the nervous system: Secondary | ICD-10-CM

## 2020-02-15 DIAGNOSIS — R293 Abnormal posture: Secondary | ICD-10-CM

## 2020-02-15 DIAGNOSIS — M6281 Muscle weakness (generalized): Secondary | ICD-10-CM

## 2020-02-15 DIAGNOSIS — M25622 Stiffness of left elbow, not elsewhere classified: Secondary | ICD-10-CM

## 2020-02-15 DIAGNOSIS — R2681 Unsteadiness on feet: Secondary | ICD-10-CM

## 2020-02-15 NOTE — Therapy (Signed)
[facility] [address] [place], [place], [address] Phone: [phone]   Fax:  [phone]  Physical Therapy Treatment  Patient Details  Name: Christina Evans MRN: [id number] Date of Birth: [birth year] Referring Provider (PT): Garvin Fila, MD   Encounter Date: 02/15/2020  PT End of Session - 02/15/20 1604    Visit Number  2    Number of Visits  25    Date for PT Re-Evaluation  05/07/20   written for 60 day POC   Authorization Type  [employer] Medicare    PT Start Time  0805    PT Stop Time  0845    PT Time Calculation (min)  40 min    Equipment Utilized During Treatment  Gait belt    Activity Tolerance  Patient tolerated treatment well    Behavior During Therapy  [facility] for tasks assessed/performed       Past Medical History:  Diagnosis Date  . Asthma   . CVA (cerebral vascular accident) ([place])   . Diabetes mellitus without complication [facility])     Past Surgical History:  Procedure Laterality Date  . IR ANGIO INTRA EXTRACRAN SEL COM CAROTID INNOMINATE BILAT MOD SED  09/15/2019  . IR ANGIO VERTEBRAL SEL VERTEBRAL UNI L MOD SED  09/15/2019    There were no vitals filed for this visit.  Subjective Assessment - 02/15/20 0808    Subjective  No changes since eval. Randomly in the middle of the night, pt got up to clean her room and daughter found her sitting in the chair.    Patient is accompained by:  Family member   daughter [name]   Pertinent History  PMH significant for large right MCA CVA, HTN, T2DM, HLD, hx of Left foot fifth metatarsal fracture with osteopenia, asthma.    Limitations  Standing;Walking    How long can you stand comfortably?  1 minute (when daughter is changing her when she is wearing diapers).    How long can you walk comfortably?  a few feet.    Diagnostic tests  MRI brain with moderate sized acute to early subacute left ACA territory infarct andlarge chronic right MCA territory infarct.     Patient Stated Goals  wants to get better, wants to be independent.    Currently in Pain?  Yes    Pain Score  7     Pain Location  Arm    Pain Orientation  Left    Pain Descriptors / Indicators  Aching    Aggravating Factors   nothing    Pain Relieving Factors  when she massages it                       [facility] Adult PT Treatment/Exercise - 02/15/20 0001      Bed Mobility   Bed Mobility  Supine to Sit;Sit to Supine;Rolling Right    Rolling Right  Independent    Supine to Sit  Independent    Sit to Supine  Independent      Transfers   Transfers  Sit to Stand;Stand to Sit    Sit to Stand  4: Min guard   with hemiwalker from mat table   Stand to Sit  4: Min guard   with hemiwalker from mat table   Comments  stand step transfer from w/c <> mat: min A when transferring towards L, min guard when transferring back to w/c towards R  Ambulation/Gait   Pre-Gait Activities  standing at edge of mat with support with hemiwalker, attempted weight shifting towards LLE, pt with increased difficulty weight shifting secondary to spasticity and possible leg length discrepancy      Exercises   Exercises  Other Exercises    Other Exercises   In supine pt with incr extensor positioning of LLE and incr spasticity of hip ADD. Therapist performing PROM of hip and knee flexion, attempted hip ADD stretching with legs extended and in hookyling, however unable to stretch due to spasticity. Calf stretch in supine 3 x 20 seconds on L. Supine heel slides with AAROM on L 1 x 10 reps.  With bolster under LLE 1 x 10 reps SAQs, with therapist needing to provide assist for positioning. Performed exercises for pt's HEP (see below).           Access Code: [email or web address] URL: [email or web address] Date: 02/15/2020 Prepared by: Sherlie Ban  Initiated HEP:   Exercises Supine Bridge - 1 x daily - 7 x weekly - 2 sets - 10 reps Supine Hip and Knee Flexion PROM with Caregiver - 1 x  daily - 7 x weekly - 1 sets - 10 reps Supine Ankle Dorsiflexion Stretch with Caregiver - 2 x daily - 7 x weekly - 3 sets - 10-15 hold Supine March - 1 x daily - 7 x weekly - 2 sets - 10 reps Lower Trunk Rotations - 1 x daily - 7 x weekly - 2 sets - 10 reps - with caregiver assistance       PT Education - 02/15/20 1604    Education Details  initial HEP for LE strengthening/ROM - for pt's daughter to assist    Person(s) Educated  Patient;Child(ren)    Methods  Explanation;Demonstration;Handout    Comprehension  Verbalized understanding;Returned demonstration;Verbal cues required;Tactile cues required       PT Short Term Goals - 02/07/20 1141      PT SHORT TERM GOAL #1   Title  Pt will undergo further assessment of L AFO for gait, mobility, and transfers. ALL STGS DUE 03/13/20.    Time  5   due to delay in scheduling   Period  Weeks    Status  New    Target Date  03/13/20      PT SHORT TERM GOAL #2   Title  Pt will perform stand step transfers with supervision in order to decr caregiver burder.    Time  5    Period  Weeks    Status  New      PT SHORT TERM GOAL #3   Title  Pt will tolerate static standing for at least 3 minutes with single UE support in order to improve tolerance for ADLs.    Time  5    Period  Weeks    Status  New      PT SHORT TERM GOAL #4   Title  Pt will ambulate at least 42' with hemiwalker and min guard in order to improve functional mobility.    Time  5    Period  Weeks    Status  New      PT SHORT TERM GOAL #5   Title  Pt and pt's daughter will be independent with initial HEP for weight bearing/lower extremity stretching.    Time  5    Period  Weeks    Status  New        PT Long Term Goals - 02/07/20 1144  PT LONG TERM GOAL #1   Title  Pt will perform stand step transfers with no AD with mod I in order to decr caregiver burden. ALL LTGS DUE 05/08/20    Time  13    Period  Weeks    Status  New    Target Date  05/08/20      PT LONG  TERM GOAL #2   Title  Pt will tolerate static standing for at least 5 minutes with single UE vs. no UE support in order to improve tolerance for ADLs.    Time  13    Period  Weeks    Status  New      PT LONG TERM GOAL #3   Title  Pt will ambulate at least 200' with hemiwalker vs. LRAD and supervision in order to improve functional houeshold mobility.    Time  13    Period  Weeks    Status  New      PT LONG TERM GOAL #4   Title  Pt will perform at least 5 sit <> stand transfers from mat table and standard height chair with mod I with no UE support vs. single UE support in order to demo improved functional transfers    Time  13    Period  Weeks    Status  New      PT LONG TERM GOAL #5   Title  Pt and pt's daughter will be independent with final HEP for weight bearing/lower extremity stretching/strengthening.    Time  13    Period  Weeks    Status  New            Plan - 02/15/20 1605    Clinical Impression Statement  Pt needs frequent cues throughout session to the task at hand, as pt is easily distractable. In supine position, pt with noted LLE spasticity in extensor pattern and hip ADD, even with passive stretching pt continues with strong hip ADD spasticity. Unable to assess leg length discrepancy in supine due to spasticity. With static standing pt with heavy reliance on RLE with LLE "floating" off the ground. Remainder of session focused on initiating HEP (with daughter's help) for ROM and strengthening. Will continue to progress towards LTGs.    Personal Factors and Comorbidities  Comorbidity 3+;Past/Current Experience;Time since onset of injury/illness/exacerbation;Behavior Pattern    Comorbidities  PMH significant for large right MCA CVA, HTN, T2DM, HLD, hx of Left foot fifth metatarsal fracture with osteopenia, asthma.    Examination-Activity Limitations  Stand;Locomotion Level;Transfers;Toileting;Continence;Dressing    Examination-Participation Restrictions  Community  Activity    Stability/Clinical Decision Making  Evolving/Moderate complexity    Rehab Potential  Good    PT Frequency  2x / week    PT Duration  12 weeks    PT Treatment/Interventions  ADLs/Self Care Home Management;Aquatic Therapy;Electrical Stimulation;DME Instruction;Gait training;Functional mobility training;Neuromuscular re-education;Balance training;Therapeutic exercise;Therapeutic activities;Patient/family education;Orthotic Fit/Training;Passive range of motion;Energy conservation    PT Next Visit Plan  how was initial HEP? standing balance/weight shifting. assess for leg length discrepancy in supine, initial stretching program for home, assess for proper AFO (pt with incr extension and decr foot clearance during gait due to spasticity).    Consulted and Agree with Plan of Care  Patient;Family member/caregiver       Patient will benefit from skilled therapeutic intervention in order to improve the following deficits and impairments:  Abnormal gait, Decreased activity tolerance, Decreased coordination, Decreased balance, Decreased cognition, Decreased endurance, Decreased knowledge of  use of DME, Decreased range of motion, Decreased strength, Difficulty walking, Impaired tone, Impaired UE functional use, Postural dysfunction, Impaired sensation  Visit Diagnosis: Muscle weakness (generalized)  Spastic hemiplegia of left nondominant side as late effect of cerebral infarction ([facility])  Unsteadiness on feet  Other symptoms and signs involving the nervous system  Abnormal posture     Problem List Patient Active Problem List   Diagnosis Date Noted  . Cerebrovascular accident (CVA) due to thrombosis of precerebral artery ([facility]) 11/30/2019  . 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 Spastic hemiparesis affecting nondominant side ([facility]CC) 11/30/2019  . Chronic bilateral low back pain with sciatica 11/30/2019  . Metatarsal fracture 09/22/2019   . Depression 09/22/2019  . Asthma 09/22/2019  . Palliative care by specialist   . Goals of care, counseling/discussion   . Encephalopathy   . Acute CVA (cerebrovascular accident) ([facility]) 09/14/2019  . Acute arterial ischemic stroke, multifocal, anterior circulation, right ([facility]CC) 09/14/2019  . Severe comorbid illness   . AMS (altered mental status) 09/13/2019  . 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  . Hyperlipidemia 09/02/2017  . Hypertension 09/02/2017  . Chest pain 09/02/2017    Drake Leach, PT, DPT  02/15/2020, 4:14 PM  [facility] [address] [place], [place], [address] Phone: [phone]   Fax:  [phone]  Name: Taya Ashbaugh MRN: [id number] Date of Birth: [birth year]

## 2020-02-15 NOTE — Therapy (Signed)
[facility] [address] [place], [place], [address] Phone: [phone]   Fax:  [phone]  Occupational Therapy Treatment  Patient Details  Name: Christina Evans MRN: [id number] Date of Birth: Dec 31, 1960 Referring Provider (OT): Dr. Pearlean Brownie   Encounter Date: 02/15/2020  OT End of Session - 02/15/20 0858    Visit Number  3    Number of Visits  24    Date for OT Re-Evaluation  05/21/20    Authorization Type  [employer]HC medicare and medicaid as secondary. Pt will need PN every 10th visit    Authorization Time Period  90 days - 05/21/2020    Authorization - Visit Number  3    Authorization - Number of Visits  10    OT Start Time  0847    OT Stop Time  0930    OT Time Calculation (min)  43 min    Activity Tolerance  Patient tolerated treatment well       Past Medical History:  Diagnosis Date  . Asthma   . CVA (cerebral vascular accident) ([facility])   . Diabetes mellitus without complication [facility])     Past Surgical History:  Procedure Laterality Date  . IR ANGIO INTRA EXTRACRAN SEL COM CAROTID INNOMINATE BILAT MOD SED  09/15/2019  . IR ANGIO VERTEBRAL SEL VERTEBRAL UNI L MOD SED  09/15/2019    There were no vitals filed for this visit.  Subjective Assessment - 02/15/20 0857    Subjective   reports shoulder and hand pain    Currently in Pain?  Yes    Pain Score  3     Pain Location  Shoulder   hand   Pain Orientation  Left    Pain Descriptors / Indicators  Aching    Pain Type  Chronic pain    Pain Onset  More than a month ago    Pain Frequency  Intermittent    Aggravating Factors   malpositioning    Pain Relieving Factors  repositioning                 Treatment: therapist initiated fabrication o resting hand splint, due to pt's significant spasticity increased time was required.  Therapist performed gentle passive stretch to digits and wrist of LUE due to tightness/ spasticity, and pt performed gentle  self stretch table slide along tabletop after instruction for shoulder flexion and elbow extension, min/ mod v.c. Pt appears very fearful and requires reassurance.           OT Short Term Goals - 02/12/20 1236      OT SHORT TERM GOAL #1   Title  Pt and dtr will be mod  I with HEP to address ROM for RUE - 03/20/2020    Status  On-going      OT SHORT TERM GOAL #2   Title  Pt and dtr will verbalize understanding for AE for cutting food on plate    Status  On-going      OT SHORT TERM GOAL #3   Title  Pt will be mod I with washing face and set up for bushing teeth.    Status  On-going      OT SHORT TERM GOAL #4   Title  Pt will be mod a for UB dressing    Status  On-going      OT SHORT TERM GOAL #5   Title  Pt will be mod a for LB dressing    Status  On-going      OT SHORT TERM GOAL #6   Title  Pt will require mod a to hike pants    Status  On-going      OT SHORT TERM GOAL #7   Title  Pt and dtr will verbalize understanding of toileting schedule to address incontinence    Status  On-going      OT SHORT TERM GOAL #8   Title  Pt and dtr will be mod with splint wear and care (resting hand splint at night)    Status  On-going        OT Long Term Goals - 02/12/20 1237      OT LONG TERM GOAL #1   Title  Pt and dtr will be mod I with upgraded home activities program to include focus ADL's, transitional movements and balance. - 05/01/2020    Status  On-going      OT LONG TERM GOAL #2   Title  Pt will be supervision for toilet transfers    Status  On-going      OT LONG TERM GOAL #3   Title  Pt will be no more than min a for hiking pants and toilet hygiene    Status  On-going      OT LONG TERM GOAL #4   Title  Pt will tolerate 110* of L shoulder flexion and 90* abduction during HEP for ease with ADL's with more than " a little " pain based on faces.    Status  On-going      OT LONG TERM GOAL #5   Title  Pt will be supervision for simple snack prep    Status   On-going      OT LONG TERM GOAL #6   Title  Pt and dtr will complete FOTO outcome scale    Status  On-going            Plan - 02/15/20 1325    Clinical Impression Statement  Therapist initiated fabrication of a resting hand splint however did not issue last visit due to time constraints and dtr/ patient need instruction regarding application.    OT Occupational Profile and History  Detailed Assessment- Review of Records and additional review of physical, cognitive, psychosocial history related to current functional performance    Occupational performance deficits (Please refer to evaluation for details):  ADL's;IADL's;Work;Leisure;Social Participation    Body Structure / Function / Physical Skills  ADL;Balance;Continence;Endurance;GMC;IADL;Mobility;Sensation;ROM;Pain;Strength;Tone;UE functional use    Rehab Potential  Good    Clinical Decision Making  Multiple treatment options, significant modification of task necessary    Comorbidities Affecting Occupational Performance:  Presence of comorbidities impacting occupational performance    Comorbidities impacting occupational performance description:  see above    Modification or Assistance to Complete Evaluation   Max significant modification of tasks or assist is necessary to complete    OT Frequency  2x / week    OT Duration  12 weeks    OT Treatment/Interventions  Self-care/ADL training;Aquatic Therapy;Electrical Stimulation;Ultrasound;Moist Heat;Therapeutic exercise;Neuromuscular education;Manual Therapy;Functional Mobility Training;DME and/or AE instruction;Passive range of motion;Patient/family education;Cognitive remediation/compensation;Therapeutic activities;Splinting;Balance training    Plan  continue fitting of resting hand splint and issue to patient after educating pt/ dtr, manual therapy to begin to address LUE ROM/pain,    Consulted and Agree with Plan of Care  Patient;Family member/caregiver    Family Member Consulted  dtr        Patient will benefit from skilled therapeutic intervention in order to improve  the following deficits and impairments:   Body Structure / Function / Physical Skills: ADL, Balance, Continence, Endurance, GMC, IADL, Mobility, Sensation, ROM, Pain, Strength, Tone, UE functional use       Visit Diagnosis: Muscle weakness (generalized)  Spastic hemiplegia of left nondominant side as late effect of cerebral infarction (HCC)  Stiffness of left upper arm joint    Problem List Patient Active Problem List   Diagnosis Date Noted  . Cerebrovascular accident (CVA) due to thrombosis of precerebral artery ([facility]) 11/30/2019  . 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 Spastic hemiparesis affecting nondominant side ([facility]) 11/30/2019  . Chronic bilateral low back pain with sciatica 11/30/2019  . Metatarsal fracture 09/22/2019  . Depression 09/22/2019  . Asthma 09/22/2019  . Palliative care by specialist   . Goals of care, counseling/discussion   . Encephalopathy   . Acute CVA (cerebrovascular accident) (HCC) 09/14/2019  . Acute arterial ischemic stroke, multifocal, anterior circulation, right (HCC) 09/14/2019  . Severe comorbid illness   . AMS (altered mental status) 09/13/2019  . 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  . Hyperlipidemia 09/02/2017  . Hypertension 09/02/2017  . Chest pain 09/02/2017    Mckaylee Dimalanta 02/15/2020, 1:27 PM  [facility] [address] [place], [place], [address] Phone: [phone]   Fax:  [phone]  Name: Christina Evans MRN: [id number] Date of Birth: [birth year]

## 2020-02-15 NOTE — Patient Instructions (Addendum)
Access Code: [email or web address] URL: [email or web address] Date: 02/15/2020 Prepared by: Sherlie Ban  Exercises Supine Bridge - 1 x daily - 7 x weekly - 2 sets - 10 reps Supine Hip and Knee Flexion PROM with Caregiver - 1 x daily - 7 x weekly - 1 sets - 10 reps Supine Ankle Dorsiflexion Stretch with Caregiver - 2 x daily - 7 x weekly - 3 sets - 10-15 hold Supine March - 1 x daily - 7 x weekly - 2 sets - 10 reps Lower Trunk Rotations - 1 x daily - 7 x weekly - 2 sets - 10 reps

## 2020-02-19 ENCOUNTER — Encounter: Payer: Medicare Other | Admitting: Occupational Therapy

## 2020-02-20 ENCOUNTER — Other Ambulatory Visit (HOSPITAL_COMMUNITY): Payer: Self-pay | Admitting: Neurology

## 2020-02-20 DIAGNOSIS — F0391 Unspecified dementia with behavioral disturbance: Secondary | ICD-10-CM

## 2020-02-21 ENCOUNTER — Ambulatory Visit: Payer: Medicare Other | Admitting: Physical Therapy

## 2020-02-21 ENCOUNTER — Other Ambulatory Visit: Payer: Self-pay

## 2020-02-21 DIAGNOSIS — I69354 Hemiplegia and hemiparesis following cerebral infarction affecting left non-dominant side: Secondary | ICD-10-CM

## 2020-02-21 DIAGNOSIS — R2681 Unsteadiness on feet: Secondary | ICD-10-CM

## 2020-02-21 DIAGNOSIS — R29818 Other symptoms and signs involving the nervous system: Secondary | ICD-10-CM

## 2020-02-21 DIAGNOSIS — M6281 Muscle weakness (generalized): Secondary | ICD-10-CM

## 2020-02-21 NOTE — Therapy (Signed)
[facility] [address] [place], [place], [address] Phone: [phone]   Fax:  [phone]  Physical Therapy Treatment  Patient Details  Name: Christina Evans MRN: [id number] Date of Birth: 27-Aug-1961 Referring Provider (PT): Micki Riley, MD   Encounter Date: 02/21/2020  PT End of Session - 02/21/20 1036    Visit Number  3    Number of Visits  25    Date for PT Re-Evaluation  05/07/20   written for 60 day POC   Authorization Type  [employer] Medicare    PT Start Time  [phone]   pt arrived late   PT Stop Time  0931    PT Time Calculation (min)  36 min    Equipment Utilized During Treatment  Gait belt    Activity Tolerance  Patient tolerated treatment well    Behavior During Therapy  [facility] for tasks assessed/performed       Past Medical History:  Diagnosis Date  . Asthma   . CVA (cerebral vascular accident) (HCC)   . Diabetes mellitus without complication [facility])     Past Surgical History:  Procedure Laterality Date  . IR ANGIO INTRA EXTRACRAN SEL COM CAROTID INNOMINATE BILAT MOD SED  09/15/2019  . IR ANGIO VERTEBRAL SEL VERTEBRAL UNI L MOD SED  09/15/2019    There were no vitals filed for this visit.  Subjective Assessment - 02/21/20 0857    Subjective  No changes since she was last here. No falls. Reports the exercises at home are going well. Daughter reports that her L arm is feeling more tight.    Patient is accompained by:  Family member   daughter [name]   Pertinent History  PMH significant for large right MCA CVA, HTN, T2DM, HLD, hx of Left foot fifth metatarsal fracture with osteopenia, asthma.    Limitations  Standing;Walking    How long can you stand comfortably?  1 minute (when daughter is changing her when she is wearing diapers).    How long can you walk comfortably?  a few feet.    Diagnostic tests  MRI brain with moderate sized acute to early subacute left ACA territory infarct andlarge chronic  right MCA territory infarct.    Patient Stated Goals  wants to get better, wants to be independent.    Currently in Pain?  No/denies                       [facility] Adult PT Treatment/Exercise - 02/21/20 0001      Transfers   Transfers  Sit to Stand;Stand to Sit    Sit to Stand  4: Min guard;5: Supervision    Sit to Stand Details  Manual facilitation for placement;Manual facilitation for weight shifting    Stand to Sit  4: Min guard   with hemiwalker from mat table   Comments  stand step transfer from w/c <> mat: min A when transferring towards L, min guard when transferring back to w/c towards R      Neuro Re-ed    Neuro Re-ed Details   standing at edge of mat table with RUE on hemiwalker: stepping RLE anteriorly onto 4" step for increased weight bearing/weight shift towards L, 1 x 12 reps, pt fearful with shifting weight towards L and reliant on balance with RUE on hemiwalker. Attempted with asking pt to let go of hemiwalker, but pt too fearful. Min guard, needed verbal, demonstrative and tactile cues to perform  as pt easily distractable       Exercises   Exercises  Other Exercises    Other Exercises   Sidelying on R on mat table: therapist performing pelvic PNF elevation/depression for L trunk stretch/elongation, multiple reps, progressing to asking pt to try to help depress pelvis, pt with difficulty initiating movement. Therapist assisting with LLE side lying hip flexor stretch with 20-30 second holds x3 reps, progressing to [facility] asking pt to perform hip flexion and then perform hip extension 1 x 10 reps. In supine: 1 x 10 reps LLE PNF D1 pattern, with asking pt to assist, unable to fully ABD due to incr ADD spasticity/tightness. 1 x 10 reps bridging with therapist having hand over pt's L ASIS with cues to push up first with L side and cues for full ROM.              PT Education - 02/21/20 1036    Education Details  therapist to put in referral for speech therapy     Person(s) Educated  Patient;Child(ren)    Methods  Explanation    Comprehension  Verbalized understanding       PT Short Term Goals - 02/07/20 1141      PT SHORT TERM GOAL #1   Title  Pt will undergo further assessment of L AFO for gait, mobility, and transfers. ALL STGS DUE 03/13/20.    Time  5   due to delay in scheduling   Period  Weeks    Status  New    Target Date  03/13/20      PT SHORT TERM GOAL #2   Title  Pt will perform stand step transfers with supervision in order to decr caregiver burder.    Time  5    Period  Weeks    Status  New      PT SHORT TERM GOAL #3   Title  Pt will tolerate static standing for at least 3 minutes with single UE support in order to improve tolerance for ADLs.    Time  5    Period  Weeks    Status  New      PT SHORT TERM GOAL #4   Title  Pt will ambulate at least 20' with hemiwalker and min guard in order to improve functional mobility.    Time  5    Period  Weeks    Status  New      PT SHORT TERM GOAL #5   Title  Pt and pt's daughter will be independent with initial HEP for weight bearing/lower extremity stretching.    Time  5    Period  Weeks    Status  New        PT Long Term Goals - 02/07/20 1144      PT LONG TERM GOAL #1   Title  Pt will perform stand step transfers with no AD with mod I in order to decr caregiver burden. ALL LTGS DUE 05/08/20    Time  13    Period  Weeks    Status  New    Target Date  05/08/20      PT LONG TERM GOAL #2   Title  Pt will tolerate static standing for at least 5 minutes with single UE vs. no UE support in order to improve tolerance for ADLs.    Time  13    Period  Weeks    Status  New      PT  LONG TERM GOAL #3   Title  Pt will ambulate at least 200' with hemiwalker vs. LRAD and supervision in order to improve functional houeshold mobility.    Time  13    Period  Weeks    Status  New      PT LONG TERM GOAL #4   Title  Pt will perform at least 5 sit <> stand transfers from mat table  and standard height chair with mod I with no UE support vs. single UE support in order to demo improved functional transfers    Time  13    Period  Weeks    Status  New      PT LONG TERM GOAL #5   Title  Pt and pt's daughter will be independent with final HEP for weight bearing/lower extremity stretching/strengthening.    Time  13    Period  Weeks    Status  New            Plan - 02/21/20 1048    Clinical Impression Statement  Pt continues to need frequent multi-modal cues and attention to the task at hand, as pt is easily distractable. Pt continues with significant LLE spasticity with incr hip ADD in supine and standing position. Performed standing weight shifting towards LLE and tapping RLE to step, pt with single UE support on hemiwalker, pt with lean towards R and fearful of letting go of hemiwalker. Will continue to progress towards LTGs.    Personal Factors and Comorbidities  Comorbidity 3+;Past/Current Experience;Time since onset of injury/illness/exacerbation;Behavior Pattern    Comorbidities  PMH significant for large right MCA CVA, HTN, T2DM, HLD, hx of Left foot fifth metatarsal fracture with osteopenia, asthma.    Examination-Activity Limitations  Stand;Locomotion Level;Transfers;Toileting;Continence;Dressing    Examination-Participation Restrictions  Community Activity    Stability/Clinical Decision Making  Evolving/Moderate complexity    Rehab Potential  Good    PT Frequency  2x / week    PT Duration  12 weeks    PT Treatment/Interventions  ADLs/Self Care Home Management;Aquatic Therapy;Electrical Stimulation;DME Instruction;Gait training;Functional mobility training;Neuromuscular re-education;Balance training;Therapeutic exercise;Therapeutic activities;Patient/family education;Orthotic Fit/Training;Passive range of motion;Energy conservation    PT Next Visit Plan  pelvic PNF.  standing balance/weight shifting. assess for leg length discrepancy in supine, continue with LLE  stretching/strengthening, assess for proper AFO (pt with incr extension and decr foot clearance during gait due to spasticity).    Consulted and Agree with Plan of Care  Patient;Family member/caregiver       Patient will benefit from skilled therapeutic intervention in order to improve the following deficits and impairments:  Abnormal gait, Decreased activity tolerance, Decreased coordination, Decreased balance, Decreased cognition, Decreased endurance, Decreased knowledge of use of DME, Decreased range of motion, Decreased strength, Difficulty walking, Impaired tone, Impaired UE functional use, Postural dysfunction, Impaired sensation  Visit Diagnosis: Spastic hemiplegia of left nondominant side as late effect of cerebral infarction (HCC)  Muscle weakness (generalized)  Unsteadiness on feet  Other symptoms and signs involving the nervous system     Problem List Patient Active Problem List   Diagnosis Date Noted  . Cerebrovascular accident (CVA) due to thrombosis of precerebral artery (HCC) 11/30/2019  . 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 Spastic hemiparesis affecting nondominant side (HCC) 11/30/2019  . Chronic bilateral low back pain with sciatica 11/30/2019  . Metatarsal fracture 09/22/2019  . Depression 09/22/2019  . Asthma 09/22/2019  . Palliative care by specialist   .  Goals of care, counseling/discussion   . Encephalopathy   . Acute CVA (cerebrovascular accident) (HCC) 09/14/2019  . Acute arterial ischemic stroke, multifocal, anterior circulation, right (HCC) 09/14/2019  . Severe comorbid illness   . AMS (altered mental status) 09/13/2019  . 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  . Hyperlipidemia 09/02/2017  . Hypertension 09/02/2017  . Chest pain 09/02/2017    Drake Leach, PT, DPT  02/21/2020, 10:54 AM  [facility] [address] [place], [place], [address] Phone: [phone]   Fax:  [phone]  Name: Christina Evans MRN: [id number] Date of Birth: 1960-12-30

## 2020-02-22 ENCOUNTER — Ambulatory Visit: Payer: Medicare Other | Admitting: Occupational Therapy

## 2020-02-26 ENCOUNTER — Encounter: Payer: Medicare Other | Admitting: Occupational Therapy

## 2020-02-27 MED FILL — ATORVASTATIN 80 MG TABLET: 80 | 90 days supply | Qty: 90 | Fill #0

## 2020-02-28 ENCOUNTER — Encounter: Payer: Medicare Other | Admitting: Occupational Therapy

## 2020-03-05 ENCOUNTER — Encounter: Payer: Medicare Other | Admitting: Occupational Therapy

## 2020-03-05 ENCOUNTER — Ambulatory Visit: Payer: Medicare Other

## 2020-03-06 ENCOUNTER — Ambulatory Visit: Payer: Medicare Other | Admitting: Physical Therapy

## 2020-03-06 ENCOUNTER — Ambulatory Visit: Payer: Medicare Other | Attending: Neurology | Admitting: Occupational Therapy

## 2020-03-06 ENCOUNTER — Other Ambulatory Visit: Payer: Self-pay

## 2020-03-06 DIAGNOSIS — R2689 Other abnormalities of gait and mobility: Secondary | ICD-10-CM | POA: Insufficient documentation

## 2020-03-06 DIAGNOSIS — M25622 Stiffness of left elbow, not elsewhere classified: Secondary | ICD-10-CM | POA: Insufficient documentation

## 2020-03-06 DIAGNOSIS — I69354 Hemiplegia and hemiparesis following cerebral infarction affecting left non-dominant side: Secondary | ICD-10-CM | POA: Insufficient documentation

## 2020-03-06 DIAGNOSIS — I69318 Other symptoms and signs involving cognitive functions following cerebral infarction: Secondary | ICD-10-CM | POA: Insufficient documentation

## 2020-03-06 DIAGNOSIS — R2681 Unsteadiness on feet: Secondary | ICD-10-CM | POA: Insufficient documentation

## 2020-03-06 DIAGNOSIS — R262 Difficulty in walking, not elsewhere classified: Secondary | ICD-10-CM | POA: Diagnosis present

## 2020-03-06 DIAGNOSIS — M79602 Pain in left arm: Secondary | ICD-10-CM | POA: Insufficient documentation

## 2020-03-06 DIAGNOSIS — M6281 Muscle weakness (generalized): Secondary | ICD-10-CM | POA: Diagnosis present

## 2020-03-06 DIAGNOSIS — R293 Abnormal posture: Secondary | ICD-10-CM | POA: Insufficient documentation

## 2020-03-06 DIAGNOSIS — R41841 Cognitive communication deficit: Secondary | ICD-10-CM | POA: Diagnosis present

## 2020-03-06 DIAGNOSIS — R29818 Other symptoms and signs involving the nervous system: Secondary | ICD-10-CM | POA: Insufficient documentation

## 2020-03-06 NOTE — Therapy (Signed)
[facility] [address] [place], [place], [address] Phone: [phone]   Fax:  [phone]  Occupational Therapy Treatment  Patient Details  Name: Christina Evans MRN: [id number] Date of Birth: [birth year] Referring Provider (OT): Dr. Pearlean Brownie   Encounter Date: 03/06/2020  OT End of Session - 03/06/20 1320    Visit Number  4    Number of Visits  24    Date for OT Re-Evaluation  05/21/20    Authorization Type  [employer] medicare and medicaid as secondary. Pt will need PN every 10th visit    Authorization Time Period  90 days - 05/21/2020    Authorization - Visit Number  4    Authorization - Number of Visits  10    OT Start Time  1103    OT Stop Time  1145    OT Time Calculation (min)  42 min    Activity Tolerance  Patient tolerated treatment well       Past Medical History:  Diagnosis Date  . Asthma   . CVA (cerebral vascular accident) (HCC)   . Diabetes mellitus without complication [facility])     Past Surgical History:  Procedure Laterality Date  . IR ANGIO INTRA EXTRACRAN SEL COM CAROTID INNOMINATE BILAT MOD SED  09/15/2019  . IR ANGIO VERTEBRAL SEL VERTEBRAL UNI L MOD SED  09/15/2019    There were no vitals filed for this visit.  Subjective Assessment - 03/06/20 1316    Subjective   I wake up with pain in my left shoulder    Patient is accompanied by:  Family member   dtr [name]   Pertinent History  Pt s/p recent R ACA CVA 09/13/19. 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    Currently in Pain?  No/denies                Treatment: Therapist finished adjustments/ fitting  to splint and educated pt/ dtr regarding application. Pt to only wear several hours at a time during day. Eventual goal is  night time wear.           OT Treatment/ Education - 03/06/20 1316    Education Details  splint wear, care and precautions, pt instructed to only wear during day initally for 1-2 hours at a time(goal is to progress to nightime wear eventually) gentle self ROM tableslides, supination/ pronation and wrist flexion extension    Person(s) Educated  Patient;Child(ren)    Methods  Explanation;Demonstration;Verbal cues;Handout    Comprehension  Verbalized understanding;Returned demonstration;Verbal cues required       OT Short Term Goals - 02/12/20 1236      OT SHORT TERM GOAL #1   Title  Pt and dtr will be mod  I with HEP to address ROM for RUE - 03/20/2020    Status  On-going      OT SHORT TERM GOAL #2   Title  Pt and dtr will verbalize understanding for AE for cutting food on plate    Status  On-going      OT SHORT TERM GOAL #3   Title  Pt will be mod I with washing face and set up for bushing teeth.    Status  On-going      OT SHORT TERM GOAL #  4   Title  Pt will be mod a for UB dressing    Status  On-going      OT SHORT TERM GOAL #5   Title  Pt will be mod a for LB dressing    Status  On-going      OT SHORT TERM GOAL #6   Title  Pt will require mod a to hike pants    Status  On-going      OT SHORT TERM GOAL #7   Title  Pt and dtr will verbalize understanding of toileting schedule to address incontinence    Status  On-going      OT SHORT TERM GOAL #8   Title  Pt and dtr will be mod with splint wear and care (resting hand splint at night)    Status  On-going        OT Long Term Goals - 02/12/20 1237      OT LONG TERM GOAL #1   Title  Pt and dtr will be mod I with upgraded home activities program to include focus ADL's, transitional movements and balance. - 05/01/2020    Status  On-going      OT LONG TERM GOAL #2   Title  Pt will be supervision for toilet transfers    Status  On-going      OT LONG TERM GOAL #3   Title  Pt will be no more than min a for hiking  pants and toilet hygiene    Status  On-going      OT LONG TERM GOAL #4   Title  Pt will tolerate 110* of L shoulder flexion and 90* abduction during HEP for ease with ADL's with more than " a little " pain based on faces.    Status  On-going      OT LONG TERM GOAL #5   Title  Pt will be supervision for simple snack prep    Status  On-going      OT LONG TERM GOAL #6   Title  Pt and dtr will complete FOTO outcome scale    Status  On-going            Plan - 03/06/20 1320    Clinical Impression Statement  Therapist issued resting hand splint after educating pt/ dtr in wear, care and preautions.    OT Occupational Profile and History  Detailed Assessment- Review of Records and additional review of physical, cognitive, psychosocial history related to current functional performance    Occupational performance deficits (Please refer to evaluation for details):  ADL's;IADL's;Work;Leisure;Social Participation    Body Structure / Function / Physical Skills  ADL;Balance;Continence;Endurance;GMC;IADL;Mobility;Sensation;ROM;Pain;Strength;Tone;UE functional use    Rehab Potential  Good    Clinical Decision Making  Multiple treatment options, significant modification of task necessary    Comorbidities Affecting Occupational Performance:  Presence of comorbidities impacting occupational performance    Comorbidities impacting occupational performance description:  see above    Modification or Assistance to Complete Evaluation   Max significant modification of tasks or assist is necessary to complete    OT Frequency  2x / week    OT Duration  12 weeks    OT Treatment/Interventions  Self-care/ADL training;Aquatic Therapy;Electrical Stimulation;Ultrasound;Moist Heat;Therapeutic exercise;Neuromuscular education;Manual Therapy;Functional Mobility Training;DME and/or AE instruction;Passive range of motion;Patient/family education;Cognitive remediation/compensation;Therapeutic activities;Splinting;Balance  training    Plan  NMR, splint check and modifications prn, pt should progress wearing schedule of splint to night in no problems after 1 week    Consulted  and Agree with Plan of Care  Patient;Family member/caregiver    Family Member Consulted  dtr       Patient will benefit from skilled therapeutic intervention in order to improve the following deficits and impairments:   Body Structure / Function / Physical Skills: ADL, Balance, Continence, Endurance, GMC, IADL, Mobility, Sensation, ROM, Pain, Strength, Tone, UE functional use       Visit Diagnosis: Spastic hemiplegia of left nondominant side as late effect of cerebral infarction (HCC)  Muscle weakness (generalized)  Other symptoms and signs involving the nervous system    Problem List Patient Active Problem List   Diagnosis Date Noted  . Cerebrovascular accident (CVA) due to thrombosis of precerebral artery (HCC) 11/30/2019  . 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 Spastic hemiparesis affecting nondominant side (HCC) 11/30/2019  . Chronic bilateral low back pain with sciatica 11/30/2019  . Metatarsal fracture 09/22/2019  . Depression 09/22/2019  . Asthma 09/22/2019  . Palliative care by specialist   . Goals of care, counseling/discussion   . Encephalopathy   . Acute CVA (cerebrovascular accident) (HCC) 09/14/2019  . Acute arterial ischemic stroke, multifocal, anterior circulation, right (HCC) 09/14/2019  . Severe comorbid illness   . AMS (altered mental status) 09/13/2019  . 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  . Hyperlipidemia 09/02/2017  . Hypertension 09/02/2017  . Chest pain 09/02/2017    Jahaira Earnhart 03/06/2020, 1:22 PM  [facility] [address] [place], [place], [address] Phone: [phone]   Fax:  [phone]  Name: Christina Evans MRN: [id number] Date  of Birth: September 14, 1961

## 2020-03-06 NOTE — Therapy (Signed)
[facility] [address] [place], [place], [address] Phone: [phone]   Fax:  [phone]  Physical Therapy Treatment  Patient Details  Name: Christina Evans MRN: [id number] Date of Birth: 07-Jul-1961 Referring Provider (PT): Micki Riley, MD   Encounter Date: 03/06/2020  PT End of Session - 03/06/20 1129    Visit Number  4    Number of Visits  25    Date for PT Re-Evaluation  05/07/20   written for 60 day POC   Authorization Type  [employer] Medicare    PT Start Time  1018    PT Stop Time  1100    PT Time Calculation (min)  42 min    Equipment Utilized During Treatment  Gait belt    Activity Tolerance  Patient tolerated treatment well    Behavior During Therapy  [facility] for tasks assessed/performed       Past Medical History:  Diagnosis Date  . Asthma   . CVA (cerebral vascular accident) (HCC)   . Diabetes mellitus without complication [facility])     Past Surgical History:  Procedure Laterality Date  . IR ANGIO INTRA EXTRACRAN SEL COM CAROTID INNOMINATE BILAT MOD SED  09/15/2019  . IR ANGIO VERTEBRAL SEL VERTEBRAL UNI L MOD SED  09/15/2019    There were no vitals filed for this visit.  Subjective Assessment - 03/06/20 1019    Subjective  Has been doing some more walking at home - from the bed to the chair.    Patient is accompained by:  Family member   daughter Christina Evans   Pertinent History  PMH significant for large right MCA CVA, HTN, T2DM, HLD, hx of Left foot fifth metatarsal fracture with osteopenia, asthma.    Limitations  Standing;Walking    How long can you stand comfortably?  1 minute (when daughter is changing her when she is wearing diapers).    How long can you walk comfortably?  a few feet.    Diagnostic tests  MRI brain with moderate sized acute to early subacute left ACA territory infarct andlarge chronic right MCA territory infarct.    Patient Stated Goals  wants to get better, wants to be  independent.    Currently in Pain?  No/denies                       [facility] Adult PT Treatment/Exercise - 03/06/20 0001      Self-Care   Self-Care  Other Self-Care Comments    Other Self-Care Comments   Educated pt and pt's daughter about proper positioning in bed in order to decr LLE spasticity - showed how to get into hooklying position with bolster/pillows under knees. Pt initially reporting feeling uncomfortable in this position, but got better with incr time and exercise in position. Both verbalized performing at home, especially when sleeping.      Neuro Re-ed    Neuro Re-ed Details   At edge of mat table at lowest height sit <> stands with RUE support to stand with therapist performing closed chain DF stretch with over pressure x5 reps. Additional sit <> stands performed with therapist assisting with weight shift towards L and having pt sit down with no UE support, pt holding on to therapist's R arm due to incr fear. Attempted staggered stance sit <> stands, however pt too fearful to put weight through LLE and each time it was attempted RLE would shift posteriorly to take the majority of  the weight. In standing: min A of therapist at pt's pelvis and anteriorly for balance - medial/lateral weight shift - multiple reps with no UE support, with pt initially fearful of shifting towards LLE. With RUE on hemiwalker: anterior and then posterior stepping x10 reps with RLE for incr weight shift towards L, therapist assisting at times to help keep L foot in neutral positioning vs. incr external rotation.       Exercises   Exercises  Other Exercises    Other Exercises   In hooklying position on mat table: with therapist hand over pt's L ASIS x10 reps bridging with cues for full ROM and pressing L ASIS first into therapist's hand. PROM of LLE into hip/knee flexion x30 seconds of 5 reps. 10 reps of therapist resisted hip extension and hip flexion on LLE. Seated at edge of mat - therapist  assisted LLE DF stretch with LLE extended (as much as possible but limited due to hamstring tightness as well).              PT Education - 03/06/20 1129    Education Details  proper positioning in bed to decr spasticity, reviewed DF stretch and importance of performing    Person(s) Educated  Patient;Child(ren)    Methods  Explanation;Demonstration    Comprehension  Verbalized understanding       PT Short Term Goals - 02/07/20 1141      PT SHORT TERM GOAL #1   Title  Pt will undergo further assessment of L AFO for gait, mobility, and transfers. ALL STGS DUE 03/13/20.    Time  5   due to delay in scheduling   Period  Weeks    Status  New    Target Date  03/13/20      PT SHORT TERM GOAL #2   Title  Pt will perform stand step transfers with supervision in order to decr caregiver burder.    Time  5    Period  Weeks    Status  New      PT SHORT TERM GOAL #3   Title  Pt will tolerate static standing for at least 3 minutes with single UE support in order to improve tolerance for ADLs.    Time  5    Period  Weeks    Status  New      PT SHORT TERM GOAL #4   Title  Pt will ambulate at least 82' with hemiwalker and min guard in order to improve functional mobility.    Time  5    Period  Weeks    Status  New      PT SHORT TERM GOAL #5   Title  Pt and pt's daughter will be independent with initial HEP for weight bearing/lower extremity stretching.    Time  5    Period  Weeks    Status  New        PT Long Term Goals - 02/07/20 1144      PT LONG TERM GOAL #1   Title  Pt will perform stand step transfers with no AD with mod I in order to decr caregiver burden. ALL LTGS DUE 05/08/20    Time  13    Period  Weeks    Status  New    Target Date  05/08/20      PT LONG TERM GOAL #2   Title  Pt will tolerate static standing for at least 5 minutes with single UE vs. no  UE support in order to improve tolerance for ADLs.    Time  13    Period  Weeks    Status  New      PT  LONG TERM GOAL #3   Title  Pt will ambulate at least 200' with hemiwalker vs. LRAD and supervision in order to improve functional houeshold mobility.    Time  13    Period  Weeks    Status  New      PT LONG TERM GOAL #4   Title  Pt will perform at least 5 sit <> stand transfers from mat table and standard height chair with mod I with no UE support vs. single UE support in order to demo improved functional transfers    Time  13    Period  Weeks    Status  New      PT LONG TERM GOAL #5   Title  Pt and pt's daughter will be independent with final HEP for weight bearing/lower extremity stretching/strengthening.    Time  13    Period  Weeks    Status  New            Plan - 03/06/20 1215    Clinical Impression Statement  discussed with pt and pt's daughter importance of positioning in bed due to increased LLE spasticity, educated on making sure knees are bent and supported under a bolster when sleeping in bed or laying down for prolonged time vs. having BLE extended. Pt fearful with weight shifting towards LLE without UE support, therapist providing min A for balance and for weight shift. Pt easily distractable at times and needs cues to the task at hand. Will continue to progress towards LTGs.    Personal Factors and Comorbidities  Comorbidity 3+;Past/Current Experience;Time since onset of injury/illness/exacerbation;Behavior Pattern    Comorbidities  PMH significant for large right MCA CVA, HTN, T2DM, HLD, hx of Left foot fifth metatarsal fracture with osteopenia, asthma.    Examination-Activity Limitations  Stand;Locomotion Level;Transfers;Toileting;Continence;Dressing    Examination-Participation Restrictions  Community Activity    Stability/Clinical Decision Making  Evolving/Moderate complexity    Rehab Potential  Good    PT Frequency  2x / week    PT Duration  12 weeks    PT Treatment/Interventions  ADLs/Self Care Home Management;Aquatic Therapy;Electrical Stimulation;DME  Instruction;Gait training;Functional mobility training;Neuromuscular re-education;Balance training;Therapeutic exercise;Therapeutic activities;Patient/family education;Orthotic Fit/Training;Passive range of motion;Energy conservation    PT Next Visit Plan  pelvic PNF.  standing balance/weight shifting. anything in a mini squat position to break up tone/spasticity. continue with LLE stretching/strengthening, assess for proper AFO (pt with incr extension and decr foot clearance during gait due to spasticity).    Consulted and Agree with Plan of Care  Patient;Family member/caregiver       Patient will benefit from skilled therapeutic intervention in order to improve the following deficits and impairments:  Abnormal gait, Decreased activity tolerance, Decreased coordination, Decreased balance, Decreased cognition, Decreased endurance, Decreased knowledge of use of DME, Decreased range of motion, Decreased strength, Difficulty walking, Impaired tone, Impaired UE functional use, Postural dysfunction, Impaired sensation  Visit Diagnosis: Muscle weakness (generalized)  Spastic hemiplegia of left nondominant side as late effect of cerebral infarction (HCC)  Unsteadiness on feet  Other symptoms and signs involving the nervous system  Abnormal posture     Problem List Patient Active Problem List   Diagnosis Date Noted  . Cerebrovascular accident (CVA) due to thrombosis of precerebral artery ([place]) 11/30/2019  . 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 Spastic hemiparesis affecting nondominant side (HCC) 11/30/2019  . Chronic bilateral low back pain with sciatica 11/30/2019  . Metatarsal fracture 09/22/2019  . Depression 09/22/2019  . Asthma 09/22/2019  . Palliative care by specialist   . Goals of care, counseling/discussion   . Encephalopathy   . Acute CVA (cerebrovascular accident) (HCC) 09/14/2019  . Acute arterial ischemic stroke,  multifocal, anterior circulation, right (HCC) 09/14/2019  . Severe comorbid illness   . AMS (altered mental status) 09/13/2019  . 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  . Hyperlipidemia 09/02/2017  . Hypertension 09/02/2017  . Chest pain 09/02/2017    Drake Leach, PT, DPT  03/06/2020, 12:27 PM  [facility] [address] [place], [place], [address] Phone: [phone]   Fax:  [phone]  Name: Mildreth Reek MRN: [id number] Date of Birth: 12/25/1960

## 2020-03-06 NOTE — Patient Instructions (Addendum)
Your Splint This splint should initially be fitted by a healthcare practitioner.  The healthcare practitioner is responsible for providing wearing instructions and precautions to the patient, other healthcare practitioners and care provider involved in the patient's care.  This splint was custom made for you. Please read the following instructions to learn about wearing and caring for your splint.  Precautions Should your splint cause any of the following problems, remove the splint immediately and contact your therapist/physician.  Swelling  Severe Pain  Pressure Areas  Stiffness  Numbness  Do not wear your splint while operating machinery unless it has been fabricated for that purpose.  When To Wear Your Splint Where your splint according to your therapist/physician instructions. Daytime for 1-2 hours, remove splint and monitor skin closely for pressure areas or redness, if these ar noted, remove splint and stop wearing. Bring splint to your next OT session.  Care and Cleaning of Your Splint 1. Keep your splint away from open flames. 2. Your splint will lose its shape in temperatures over 135 degrees Farenheit, ( in car windows, near radiators, ovens or in hot water).  Never make any adjustments to your splint, if the splint needs adjusting remove it and make an appointment to see your therapist. 3. Your splint, including the cushion liner may be cleaned with soap and lukewarm water.  Do not immerse in hot water over 135 degrees Farenheit. 4. Straps may be washed with soap and water, but do not moisten the self-adhesive portion. 5. You can clean splint with rubbing alcohol.                 Copyright  VHI. All rights reserved.  PROM: Wrist Flexion / Extension   Grasp  hand and slowly bend wrist until stretch is felt. Relax. Then stretch as far as possible in opposite direction. Be sure to keep elbow bent.  Hold __10__ sec. each way Repeat _10__ times per set.    Do  _2-3__ sessions per day.  Pronation (Passive)   Keep elbow bent at right angle and held firmly to side. Use other hand to turn forearm until palm faces downward. Hold _10___ seconds. Repeat _10__ times. Do _2-3__ sessions per day.  SHOULDER: Flexion On Table    Place hands on table, elbows straight. Move hips away from body. Press hands down into table. Hold _5__ seconds. _10__ reps per set, __1_ sets per day, _5__ days per week   Copyright  VHI. All rights reserved.

## 2020-03-11 ENCOUNTER — Encounter: Payer: Self-pay | Admitting: Occupational Therapy

## 2020-03-11 ENCOUNTER — Telehealth: Payer: Self-pay | Admitting: Physical Therapy

## 2020-03-11 ENCOUNTER — Ambulatory Visit: Payer: Medicare Other | Admitting: Occupational Therapy

## 2020-03-11 ENCOUNTER — Other Ambulatory Visit: Payer: Self-pay

## 2020-03-11 ENCOUNTER — Ambulatory Visit: Payer: Medicare Other | Admitting: Physical Therapy

## 2020-03-11 DIAGNOSIS — R2681 Unsteadiness on feet: Secondary | ICD-10-CM

## 2020-03-11 DIAGNOSIS — M6281 Muscle weakness (generalized): Secondary | ICD-10-CM

## 2020-03-11 DIAGNOSIS — R293 Abnormal posture: Secondary | ICD-10-CM

## 2020-03-11 DIAGNOSIS — M25622 Stiffness of left elbow, not elsewhere classified: Secondary | ICD-10-CM

## 2020-03-11 DIAGNOSIS — M79602 Pain in left arm: Secondary | ICD-10-CM

## 2020-03-11 DIAGNOSIS — R29818 Other symptoms and signs involving the nervous system: Secondary | ICD-10-CM

## 2020-03-11 DIAGNOSIS — I69318 Other symptoms and signs involving cognitive functions following cerebral infarction: Secondary | ICD-10-CM

## 2020-03-11 DIAGNOSIS — I69354 Hemiplegia and hemiparesis following cerebral infarction affecting left non-dominant side: Secondary | ICD-10-CM

## 2020-03-11 NOTE — Telephone Encounter (Signed)
Dr. Pearlean Brownie, Crist Fat. Ambrocio is being seen by Physical Therapy and Occupational Therapy at [facility] for spastic L hemiparesis s/p CVA. The patient would benefit from a referral to [facility] for tone management to improve functional mobility and independence. If you agree, could you please send a referral to [facility]NR?   Thanks,  Sherlie Ban, PT, DPT 03/11/20 1:11 PM

## 2020-03-11 NOTE — Telephone Encounter (Signed)
Do you mean spasticity management referral to PMR doc ?

## 2020-03-11 NOTE — Therapy (Signed)
[facility] [address] [place], [place], [address] Phone: [phone]   Fax:  [phone]  Occupational Therapy Treatment  Patient Details  Name: Christina Evans MRN: [id number] Date of Birth: 1961/10/23 Referring Provider (OT): Dr. Leonie Man   Encounter Date: 03/11/2020  OT End of Session - 03/11/20 1035    Visit Number  5    Number of Visits  24    Date for OT Re-Evaluation  05/21/20    Authorization Type  [employer] medicare and medicaid as secondary. Pt will need PN every 10th visit    Authorization Time Period  90 days - 05/21/2020    Authorization - Visit Number  5    Authorization - Number of Visits  10    OT Start Time  0802    OT Stop Time  0845    OT Time Calculation (min)  43 min    Activity Tolerance  Patient tolerated treatment well       Past Medical History:  Diagnosis Date  . Asthma   . CVA (cerebral vascular accident) ([place])   . Diabetes mellitus without complication [facility])     Past Surgical History:  Procedure Laterality Date  . IR ANGIO INTRA EXTRACRAN SEL COM CAROTID INNOMINATE BILAT MOD SED  09/15/2019  . IR ANGIO VERTEBRAL SEL VERTEBRAL UNI L MOD SED  09/15/2019    There were no vitals filed for this visit.  Subjective Assessment - 03/11/20 0806    Subjective   I didn't have any pain in my shoulder this morning.    Pertinent History  Pt s/p recent R ACA CVA 09/13/19. 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    Currently in Pain?  No/denies                   OT Treatments/Exercises (OP) - 03/11/20 0001      ADLs   ADL Comments  Assessed and provided education and strategies for donning/doffing shirt, pants, sock and shoes.  WIth practice pt able to complete all with  supervision and min- mod cues. educated dtr on how to cue and discussed hierarchy of cueng vs physical assistance.  Pt demonstrates imrpoved problem solving with repetition. Pt does need assist with depends and bra - will address these in later session once pt is consistently completing today's activities at home.  Strategies given in writing .             OT Education - 03/11/20 0929    Education Details  donning/doffing shirt, pants, socks and shoes    Person(s) Educated  Patient;Child(ren)    Methods  Explanation;Demonstration;Verbal cues;Handout    Comprehension  Verbalized understanding;Returned demonstration;Verbal cues required   dtr to cue and provide supervision for balance and safety      OT Short Term Goals - 03/11/20 0929      OT SHORT TERM GOAL #1   Title  Pt and dtr will be mod  I with HEP to address ROM for RUE - 03/20/2020    Status  On-going      OT SHORT TERM GOAL #2   Title  Pt and dtr will verbalize understanding for AE for cutting food on plate    Status  On-going  OT SHORT TERM GOAL #3   Title  Pt will be mod I with washing face and set up for bushing teeth.    Status  On-going      OT SHORT TERM GOAL #4   Title  Pt will be mod a for UB dressing    Status  Achieved      OT SHORT TERM GOAL #5   Title  Pt will be mod a for LB dressing    Status  Achieved      OT SHORT TERM GOAL #6   Title  Pt will require mod a to hike pants    Status  Achieved      OT SHORT TERM GOAL #7   Title  Pt and dtr will verbalize understanding of toileting schedule to address incontinence    Status  On-going      OT SHORT TERM GOAL #8   Title  Pt and dtr will be mod with splint wear and care (resting hand splint at night)    Status  On-going        OT Long Term Goals - 03/11/20 0930      OT LONG TERM GOAL #1   Title  Pt and dtr will be mod I with upgraded home activities program to include focus ADL's, transitional movements and balance. - 05/01/2020    Status   On-going      OT LONG TERM GOAL #2   Title  Pt will be supervision for toilet transfers    Status  On-going      OT LONG TERM GOAL #3   Title  Pt will be no more than min a for hiking pants and toilet hygiene    Status  On-going      OT LONG TERM GOAL #4   Title  Pt will tolerate 110* of L shoulder flexion and 90* abduction during HEP for ease with ADL's with more than " a little " pain based on faces.    Status  On-going      OT LONG TERM GOAL #5   Title  Pt will be supervision for simple snack prep    Status  On-going      OT LONG TERM GOAL #6   Title  Pt and dtr will complete FOTO outcome scale    Status  On-going            Plan - 03/11/20 0930    Clinical Impression Statement  Pt making good progress toward independence with basic self care tasks.    OT Occupational Profile and History  Detailed Assessment- Review of Records and additional review of physical, cognitive, psychosocial history related to current functional performance    Occupational performance deficits (Please refer to evaluation for details):  ADL's;IADL's;Work;Leisure;Social Participation    Body Structure / Function / Physical Skills  ADL;Balance;Continence;Endurance;GMC;IADL;Mobility;Sensation;ROM;Pain;Strength;Tone;UE functional use    Rehab Potential  Good    Clinical Decision Making  Multiple treatment options, significant modification of task necessary    Comorbidities Affecting Occupational Performance:  Presence of comorbidities impacting occupational performance    Comorbidities impacting occupational performance description:  see above    Modification or Assistance to Complete Evaluation   Max significant modification of tasks or assist is necessary to complete    OT Frequency  2x / week    OT Duration  12 weeks    OT Treatment/Interventions  Self-care/ADL training;Aquatic Therapy;Electrical Stimulation;Ultrasound;Moist Heat;Therapeutic exercise;Neuromuscular education;Manual  Therapy;Functional Mobility Training;DME and/or AE instruction;Passive range of  motion;Patient/family education;Cognitive remediation/compensation;Therapeutic activities;Splinting;Balance training    Plan  check splint and progress to night wear if possible, check carry over for dressing at home, NMR for LUE/trunk, postural alignment and control, balance and functional mobility    Consulted and Agree with Plan of Care  Patient;Family member/caregiver    Family Member Consulted  dtr       Patient will benefit from skilled therapeutic intervention in order to improve the following deficits and impairments:   Body Structure / Function / Physical Skills: ADL, Balance, Continence, Endurance, GMC, IADL, Mobility, Sensation, ROM, Pain, Strength, Tone, UE functional use       Visit Diagnosis: Muscle weakness (generalized)  Spastic hemiplegia of left nondominant side as late effect of cerebral infarction (HCC)  Unsteadiness on feet  Other symptoms and signs involving the nervous system  Abnormal posture  Stiffness of left upper arm joint  Pain in left arm  Other symptoms and signs involving cognitive functions following cerebral infarction    Problem List Patient Active Problem List   Diagnosis Date Noted  . Cerebrovascular accident (CVA) due to thrombosis of precerebral artery (HCC) 11/30/2019  . 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 Spastic hemiparesis affecting nondominant side (HCC) 11/30/2019  . Chronic bilateral low back pain with sciatica 11/30/2019  . Metatarsal fracture 09/22/2019  . Depression 09/22/2019  . Asthma 09/22/2019  . Palliative care by specialist   . Goals of care, counseling/discussion   . Encephalopathy   . Acute CVA (cerebrovascular accident) (HCC) 09/14/2019  . Acute arterial ischemic stroke, multifocal, anterior circulation, right (HCC) 09/14/2019  . Severe comorbid illness   . AMS (altered mental  status) 09/13/2019  . 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  . Hyperlipidemia 09/02/2017  . Hypertension 09/02/2017  . Chest pain 09/02/2017    Norton Pastel, OTR/L 03/11/2020, 10:36 AM  [facility] [address] [place], [place], [address] Phone: [phone]   Fax:  [phone]  Name: Christina Evans MRN: [id number] Date of Birth: 1960-12-20

## 2020-03-11 NOTE — Therapy (Signed)
[facility] [address] [place], [place], [address] Phone: [phone]   Fax:  [phone]  Physical Therapy Treatment  Patient Details  Name: Christina Evans MRN: [id number] Date of Birth: 19-Sep-1961 Referring Provider (PT): Micki Riley, MD   Encounter Date: 03/11/2020  PT End of Session - 03/11/20 0934    Visit Number  5    Number of Visits  25    Date for PT Re-Evaluation  05/07/20   written for 60 day POC   Authorization Type  [employer] Medicare    PT Start Time  0848    PT Stop Time  0930    PT Time Calculation (min)  42 min    Equipment Utilized During Treatment  Gait belt    Activity Tolerance  Patient tolerated treatment well    Behavior During Therapy  [facility] for tasks assessed/performed   easily distractable at times      Past Medical History:  Diagnosis Date  . Asthma   . CVA (cerebral vascular accident) (HCC)   . Diabetes mellitus without complication [facility])     Past Surgical History:  Procedure Laterality Date  . IR ANGIO INTRA EXTRACRAN SEL COM CAROTID INNOMINATE BILAT MOD SED  09/15/2019  . IR ANGIO VERTEBRAL SEL VERTEBRAL UNI L MOD SED  09/15/2019    There were no vitals filed for this visit.  Subjective Assessment - 03/11/20 0852    Subjective  Doing well, doing the exercises and the stretches.    Patient is accompained by:  Family member   daughter [name]   Pertinent History  PMH significant for large right MCA CVA, HTN, T2DM, HLD, hx of Left foot fifth metatarsal fracture with osteopenia, asthma.    Limitations  Standing;Walking    How long can you stand comfortably?  1 minute (when daughter is changing her when she is wearing diapers).    How long can you walk comfortably?  a few feet.    Diagnostic tests  MRI brain with moderate sized acute to early subacute left ACA territory infarct andlarge chronic right MCA territory infarct.    Patient Stated Goals  wants to get better, wants  to be independent.    Currently in Pain?  No/denies                       [facility] Adult PT Treatment/Exercise - 03/11/20 0001      Transfers   Sit to Stand  4: Min guard;5: Supervision    Sit to Stand Details (indicate cue type and reason)  cues and facilitation towards LLE prior to sitting back into wheelchair and mat table and to not use RUE to reach back for sitting in order to maintain weight shift towards LLE, multiple reps performed throughout session    Stand to Sit  4: Min guard;5: Supervision    Comments  stand step transfers from w/c <> mat table: min guard for safety and for weight shifting onto LLE      Neuro Re-ed    Neuro Re-ed Details   Standing with w/c posteriorly and elevated mat table anteriorly: had pt demonstrate how she folds laundry for dynamic standing balance without RUE support, with pt having to reach across midline with RUE for increased weight shift to L to grab towels/pillow cases to fold, therapist also providing assist for weight shift. At first pt with tendency to step over towards L to grab laundry vs. shifting weight over  LLE. Standing with 2" block under RLE for increased weight shift/bearing over to LLE, reaching over with RUE towards L outside of BOS to grab cone, therapist helping to assist maintain weight bearing towards LLE 2 x 10 reps (needed seated rest break halfway through first rep due to pt reporting LLE felt fatigued).  Pt needing cues at times for the task at hand due to pt facing the gym and easily getting distracted by other pts. Standing with mat posteriorly, standing at edge of mat with no UE support (pt with tendency to want to reach towards hemi-walker for support), static standing weight shifting R/L with therapist assisting and pt with incr fear weight shifting towards L. Due to more narrow BOS, therapist placing yoga block between BLE to prevent excessive ADD and to maintain a more wide BOS. Brought mirror over for visual feedback  for midline in standing as pt heavily leans towards R, multiple reps with holding weight shifting over towards L with therapist assistance.                PT Short Term Goals - 02/07/20 1141      PT SHORT TERM GOAL #1   Title  Pt will undergo further assessment of L AFO for gait, mobility, and transfers. ALL STGS DUE 03/13/20.    Time  5   due to delay in scheduling   Period  Weeks    Status  New    Target Date  03/13/20      PT SHORT TERM GOAL #2   Title  Pt will perform stand step transfers with supervision in order to decr caregiver burder.    Time  5    Period  Weeks    Status  New      PT SHORT TERM GOAL #3   Title  Pt will tolerate static standing for at least 3 minutes with single UE support in order to improve tolerance for ADLs.    Time  5    Period  Weeks    Status  New      PT SHORT TERM GOAL #4   Title  Pt will ambulate at least 9' with hemiwalker and min guard in order to improve functional mobility.    Time  5    Period  Weeks    Status  New      PT SHORT TERM GOAL #5   Title  Pt and pt's daughter will be independent with initial HEP for weight bearing/lower extremity stretching.    Time  5    Period  Weeks    Status  New        PT Long Term Goals - 02/07/20 1144      PT LONG TERM GOAL #1   Title  Pt will perform stand step transfers with no AD with mod I in order to decr caregiver burden. ALL LTGS DUE 05/08/20    Time  13    Period  Weeks    Status  New    Target Date  05/08/20      PT LONG TERM GOAL #2   Title  Pt will tolerate static standing for at least 5 minutes with single UE vs. no UE support in order to improve tolerance for ADLs.    Time  13    Period  Weeks    Status  New      PT LONG TERM GOAL #3   Title  Pt will ambulate at least 200'  with hemiwalker vs. LRAD and supervision in order to improve functional houeshold mobility.    Time  13    Period  Weeks    Status  New      PT LONG TERM GOAL #4   Title  Pt will perform at  least 5 sit <> stand transfers from mat table and standard height chair with mod I with no UE support vs. single UE support in order to demo improved functional transfers    Time  13    Period  Weeks    Status  New      PT LONG TERM GOAL #5   Title  Pt and pt's daughter will be independent with final HEP for weight bearing/lower extremity stretching/strengthening.    Time  13    Period  Weeks    Status  New            Plan - 03/11/20 1303    Clinical Impression Statement  Focus of today's skilled session was standing weight shifting activities for increased weight shift and weight bearing through LLE. Pt initially fearful of putting more weight on L side, but was able to improve with increased reps of reaching across body with RUE to grab cones with incr weight shift towards LLE. Use of mirror used today as visual feedback for midline orientation with static standing balance at edge of mat with no UE support. Will continue to progress towards LTGs.    Personal Factors and Comorbidities  Comorbidity 3+;Past/Current Experience;Time since onset of injury/illness/exacerbation;Behavior Pattern    Comorbidities  PMH significant for large right MCA CVA, HTN, T2DM, HLD, hx of Left foot fifth metatarsal fracture with osteopenia, asthma.    Examination-Activity Limitations  Stand;Locomotion Level;Transfers;Toileting;Continence;Dressing    Examination-Participation Restrictions  Community Activity    Stability/Clinical Decision Making  Evolving/Moderate complexity    Rehab Potential  Good    PT Frequency  2x / week    PT Duration  12 weeks    PT Treatment/Interventions  ADLs/Self Care Home Management;Aquatic Therapy;Electrical Stimulation;DME Instruction;Gait training;Functional mobility training;Neuromuscular re-education;Balance training;Therapeutic exercise;Therapeutic activities;Patient/family education;Orthotic Fit/Training;Passive range of motion;Energy conservation    PT Next Visit Plan   hear back from physical medicine referral? standing balance/weight shifting. anything in a mini squat position to break up tone/spasticity. continue with LLE stretching/strengthening, assess for proper AFO (pt with incr extension and decr foot clearance during gait due to spasticity).    Consulted and Agree with Plan of Care  Patient;Family member/caregiver       Patient will benefit from skilled therapeutic intervention in order to improve the following deficits and impairments:  Abnormal gait, Decreased activity tolerance, Decreased coordination, Decreased balance, Decreased cognition, Decreased endurance, Decreased knowledge of use of DME, Decreased range of motion, Decreased strength, Difficulty walking, Impaired tone, Impaired UE functional use, Postural dysfunction, Impaired sensation  Visit Diagnosis: Muscle weakness (generalized)  Unsteadiness on feet  Other symptoms and signs involving the nervous system  Abnormal posture     Problem List Patient Active Problem List   Diagnosis Date Noted  . Cerebrovascular accident (CVA) due to thrombosis of precerebral artery (HCC) 11/30/2019  . 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 Spastic hemiparesis affecting nondominant side (HCC) 11/30/2019  . Chronic bilateral low back pain with sciatica 11/30/2019  . Metatarsal fracture 09/22/2019  . Depression 09/22/2019  . Asthma 09/22/2019  . Palliative care by specialist   . Goals of care, counseling/discussion   . Encephalopathy   .  Acute CVA (cerebrovascular accident) ([place]) 09/14/2019  . Acute arterial ischemic stroke, multifocal, anterior circulation, right ([place]) 09/14/2019  . Severe comorbid illness   . AMS (altered mental status) 09/13/2019  . Controlled diabetes mellitus type 2 with complications ([place]) 23/53/6144  . Hyperlipidemia 09/02/2017  . Hypertension 09/02/2017  . Chest pain 09/02/2017    Arliss Journey, PT, DPT   03/11/2020, 1:59 PM  [facility] [facility] [address] [place], [place], [address] Phone: [phone]   Fax:  [phone]  Name: Christina Evans MRN: [id number] Date of Birth: [birth year]

## 2020-03-11 NOTE — Patient Instructions (Signed)
Shirt off: 1. Pull shirt over head first then take off arms.  Shirt on: 1. Dress your R arm first then your left arm.  Make sure you pull the sleeve up past your elbows. 2 Gather neck hole in R hand and put over your head. 3. Dress in front of mirror and CUE her to pull it down on her left side.   Pants off: The biggest challenge she had was her balance.  I think she will do best if she is in front of the sink. Place wheelchair behind her and stand on her left.  Cue her to pull her pants down and your job is to make sure she doesn't lose her balance. Then sit to take off her feet.  Pants on: 1. Dress L foot first by crossing your left foot over your right knee. Then put your right leg in. Stand like you did when you took your pants off. Pull pants up - she will need CUES to pull it up on the left side.  Socks and shoes: 1. She can get these on herself with just cues. Have her cross her L leg over her R knee to do her left foot.    Make sure you stay with her for safety and balance.   This shouldn't take her more than 15 minutes to dress or undress. Think global cue then directive cue then set up then physical assist. Let her problem solve as long as she doesn't do something unsafe.

## 2020-03-13 ENCOUNTER — Encounter: Payer: Self-pay | Admitting: Occupational Therapy

## 2020-03-13 ENCOUNTER — Other Ambulatory Visit: Payer: Self-pay

## 2020-03-13 ENCOUNTER — Encounter: Payer: Self-pay | Admitting: Physical Therapy

## 2020-03-13 ENCOUNTER — Other Ambulatory Visit (HOSPITAL_COMMUNITY): Payer: Self-pay | Admitting: Neurology

## 2020-03-13 ENCOUNTER — Ambulatory Visit: Payer: Medicare Other | Admitting: Physical Therapy

## 2020-03-13 ENCOUNTER — Ambulatory Visit: Payer: Medicare Other | Admitting: Occupational Therapy

## 2020-03-13 DIAGNOSIS — I69354 Hemiplegia and hemiparesis following cerebral infarction affecting left non-dominant side: Secondary | ICD-10-CM | POA: Diagnosis not present

## 2020-03-13 DIAGNOSIS — M6281 Muscle weakness (generalized): Secondary | ICD-10-CM

## 2020-03-13 DIAGNOSIS — R293 Abnormal posture: Secondary | ICD-10-CM

## 2020-03-13 DIAGNOSIS — M25622 Stiffness of left elbow, not elsewhere classified: Secondary | ICD-10-CM

## 2020-03-13 DIAGNOSIS — I69398 Other sequelae of cerebral infarction: Secondary | ICD-10-CM

## 2020-03-13 DIAGNOSIS — R2689 Other abnormalities of gait and mobility: Secondary | ICD-10-CM

## 2020-03-13 DIAGNOSIS — R262 Difficulty in walking, not elsewhere classified: Secondary | ICD-10-CM

## 2020-03-13 DIAGNOSIS — R29818 Other symptoms and signs involving the nervous system: Secondary | ICD-10-CM

## 2020-03-13 DIAGNOSIS — I69318 Other symptoms and signs involving cognitive functions following cerebral infarction: Secondary | ICD-10-CM

## 2020-03-13 DIAGNOSIS — R2681 Unsteadiness on feet: Secondary | ICD-10-CM

## 2020-03-13 DIAGNOSIS — M79602 Pain in left arm: Secondary | ICD-10-CM

## 2020-03-13 MED FILL — ACCU-CHEK GUIDE TEST STRIP: 30 days supply | Qty: 100 | Fill #5

## 2020-03-13 NOTE — Patient Instructions (Signed)
Wearing schedule for the splint:  The goal is to build up your tolerance so you can wear it all night (and not during the day).  Here is how you will build up:  Wednesday; Day 1:  Wear two hours in the late morning/early afternoon and 2 hours in the evening BEFORE bed. IF no problems Thursday, Day 2:  Wear 3 hours in the morning and 3 hours in the later afternoon. IF no problems Friday, Day 3:  Wear 4 hours in the morning and 4 hours in the late afternoon/evening. IF no problems Saturday, Day 4:  Wear 4 hours in the morning and 4 hours in the late afternoon. IF no problems Sunday DAY 5: Wear all night but DO NOT wear during the day.  From this point on you will only wear it during the night.    Allow at least 2 hours in between wearing blocks for days 1-4

## 2020-03-13 NOTE — Therapy (Signed)
[facility] [address] [place], [place], [address] Phone: [phone]   Fax:  [phone]  Physical Therapy Treatment  Patient Details  Name: Christina Evans MRN: [id number] Date of Birth: 10/18/1961 Referring Provider (PT): Garvin Fila, MD   Encounter Date: 03/13/2020  PT End of Session - 03/13/20 1148    Visit Number  6    Number of Visits  25    Date for PT Re-Evaluation  05/07/20   written for 60 day POC   Authorization Type  [employer] Medicare    PT Start Time  [phone]    PT Stop Time  0930    PT Time Calculation (min)  41 min    Equipment Utilized During Treatment  Gait belt    Activity Tolerance  Patient tolerated treatment well    Behavior During Therapy  [facility] for tasks assessed/performed   easily distractable at times      Past Medical History:  Diagnosis Date  . Asthma   . CVA (cerebral vascular accident) ([place])   . Diabetes mellitus without complication [facility])     Past Surgical History:  Procedure Laterality Date  . IR ANGIO INTRA EXTRACRAN SEL COM CAROTID INNOMINATE BILAT MOD SED  09/15/2019  . IR ANGIO VERTEBRAL SEL VERTEBRAL UNI L MOD SED  09/15/2019    There were no vitals filed for this visit.  Subjective Assessment - 03/13/20 0853    Subjective  no changes, got dressed this morning.    Patient is accompained by:  Family member   daughter [name]   Pertinent History  PMH significant for large right MCA CVA, HTN, T2DM, HLD, hx of Left foot fifth metatarsal fracture with osteopenia, asthma.    Limitations  Standing;Walking    How long can you stand comfortably?  1 minute (when daughter is changing her when she is wearing diapers).    How long can you walk comfortably?  a few feet.    Diagnostic tests  MRI brain with moderate sized acute to early subacute left ACA territory infarct andlarge chronic right MCA territory infarct.    Patient Stated Goals  wants to get better, wants to be  independent.    Currently in Pain?  No/denies                       [facility] Adult PT Treatment/Exercise - 03/13/20 0001      Transfers   Transfers  Sit to Stand;Stand to Sit    Sit to Stand  5: Supervision;4: Min guard    Sit to Stand Details (indicate cue type and reason)  cues to stand without pushing off with RUE from w/c in order to facilitate improved weight bearing towards L to decr LUE spascticity once in standing       Ambulation/Gait   Ambulation/Gait  Yes    Ambulation/Gait Assistance  4: Min guard;4: Min assist    Ambulation/Gait Assistance Details  used theraband on anterior/inferior portion of RW between wheels as visual cues (for pt to look down and attend towards LLE) to step towards "blue" and cues for larger step lengths and to turn L toes forward. Tactile cues throughout gait to relax RUE. Allowed pt to "bump" LLE into RW at times as feedback for LLE placement and RW navigation. Practiced ambulating without RW, with small bouts of gait with pt's hands on therapist's hips and therapist holding pt's arms anteriorly, with cues for posture and larger step lengths  and therapist faciliating weight shift more towards LLE as pt with increased pressure through RUE     Ambulation Distance (Feet)  2 Feet   x30, plus 2 x 15 with therapist assisting gait    Assistive device  Rolling walker;Other (Comment)   L hand orthosis   Gait Pattern  Step-to pattern;Step-through pattern;Decreased stance time - left;Decreased hip/knee flexion - left;Decreased weight shift to left;Decreased dorsiflexion - left;Left circumduction;Left hip hike;Narrow base of support;Poor foot clearance - left    Ambulation Surface  Level;Indoor    Gait Comments  pt with initial increase in spasticity in LUE initially in standing when attempting to put L hand in orthosis prior to ambulating with RW, needed cues to perform sit <> stand with no UE support, to weight shift towards L and for deep breathing in  order to relax L hand to get fingers and wrist extended to put in orthosis, took increased time before first bout of gait       Neuro Re-ed    Neuro Re-ed Details   Standing at edge of mat table with RW with L hand in L orthosis x10 reps stepping LLE to tap colorful disc on floor with cues to look towards L foot due to inattention and decrased sensation/awareness prior to gait. With therapist to pt's L blocking L knee as appropriate with mirror anteriorly for visual feedback, cues to stand tall and weight shift through hips towards therapist for additional trunk elongation on L side, progressing to weight shifting towards L and then taking a step forward/backward with RLE. With use of mirror with pt in RW, cues for pt for posture and to relax RUE as pt demonstrating incr shoulder elevation on R.                PT Short Term Goals - 02/07/20 1141      PT SHORT TERM GOAL #1   Title  Pt will undergo further assessment of L AFO for gait, mobility, and transfers. ALL STGS DUE 03/13/20.    Time  5   due to delay in scheduling   Period  Weeks    Status  New    Target Date  03/13/20      PT SHORT TERM GOAL #2   Title  Pt will perform stand step transfers with supervision in order to decr caregiver burder.    Time  5    Period  Weeks    Status  New      PT SHORT TERM GOAL #3   Title  Pt will tolerate static standing for at least 3 minutes with single UE support in order to improve tolerance for ADLs.    Time  5    Period  Weeks    Status  New      PT SHORT TERM GOAL #4   Title  Pt will ambulate at least 47' with hemiwalker and min guard in order to improve functional mobility.    Time  5    Period  Weeks    Status  New      PT SHORT TERM GOAL #5   Title  Pt and pt's daughter will be independent with initial HEP for weight bearing/lower extremity stretching.    Time  5    Period  Weeks    Status  New        PT Long Term Goals - 02/07/20 1144      PT LONG TERM GOAL #1  Title  Pt will perform stand step transfers with no AD with mod I in order to decr caregiver burden. ALL LTGS DUE 05/08/20    Time  13    Period  Weeks    Status  New    Target Date  05/08/20      PT LONG TERM GOAL #2   Title  Pt will tolerate static standing for at least 5 minutes with single UE vs. no UE support in order to improve tolerance for ADLs.    Time  13    Period  Weeks    Status  New      PT LONG TERM GOAL #3   Title  Pt will ambulate at least 200' with hemiwalker vs. LRAD and supervision in order to improve functional houeshold mobility.    Time  13    Period  Weeks    Status  New      PT LONG TERM GOAL #4   Title  Pt will perform at least 5 sit <> stand transfers from mat table and standard height chair with mod I with no UE support vs. single UE support in order to demo improved functional transfers    Time  13    Period  Weeks    Status  New      PT LONG TERM GOAL #5   Title  Pt and pt's daughter will be independent with final HEP for weight bearing/lower extremity stretching/strengthening.    Time  13    Period  Weeks    Status  New            Plan - 03/13/20 1224    Clinical Impression Statement  focus of today's skilled session was gait training with RW and NMR for weight shifting towards LLE. Pt initially with incr LUE spasticity and took increased time and cueing to allow LUE to relax before putting in L hand orthosis. Pt responded best to asking pt to perform sit > stand without use of RUE in order for increased weight shift towards LLE. Pt needing visual cueing to look down in order to properly place LLE due to decr awareness/sensation. Will continue to progress towards LTGs.    Personal Factors and Comorbidities  Comorbidity 3+;Past/Current Experience;Time since onset of injury/illness/exacerbation;Behavior Pattern    Comorbidities  PMH significant for large right MCA CVA, HTN, T2DM, HLD, hx of Left foot fifth metatarsal fracture with osteopenia,  asthma.    Examination-Activity Limitations  Stand;Locomotion Level;Transfers;Toileting;Continence;Dressing    Examination-Participation Restrictions  Community Activity    Stability/Clinical Decision Making  Evolving/Moderate complexity    Rehab Potential  Good    PT Frequency  2x / week    PT Duration  12 weeks    PT Treatment/Interventions  ADLs/Self Care Home Management;Aquatic Therapy;Electrical Stimulation;DME Instruction;Gait training;Functional mobility training;Neuromuscular re-education;Balance training;Therapeutic exercise;Therapeutic activities;Patient/family education;Orthotic Fit/Training;Passive range of motion;Energy conservation    PT Next Visit Plan  gait training with RW, hear back from physical medicine referral? standing balance/weight shifting. anything in a mini squat position to break up tone/spasticity. continue with LLE stretching/strengthening, assess for proper AFO (pt with incr extension and decr foot clearance during gait due to spasticity).    Consulted and Agree with Plan of Care  Patient;Family member/caregiver       Patient will benefit from skilled therapeutic intervention in order to improve the following deficits and impairments:  Abnormal gait, Decreased activity tolerance, Decreased coordination, Decreased balance, Decreased cognition, Decreased endurance, Decreased knowledge of use  of DME, Decreased range of motion, Decreased strength, Difficulty walking, Impaired tone, Impaired UE functional use, Postural dysfunction, Impaired sensation  Visit Diagnosis: Other abnormalities of gait and mobility  Unsteadiness on feet  Muscle weakness (generalized)  Other symptoms and signs involving the nervous system  Abnormal posture  Difficulty in walking, not elsewhere classified     Problem List Patient Active Problem List   Diagnosis Date Noted  . Cerebrovascular accident (CVA) due to thrombosis of precerebral artery (HCC) 11/30/2019  . Occlusion of  right middle cerebral artery not resulting in cerebral infarction 11/30/2019  . Spastic hemiparesis affecting nondominant side (HCC) 11/30/2019  . Chronic bilateral low back pain with sciatica 11/30/2019  . Metatarsal fracture 09/22/2019  . Depression 09/22/2019  . Asthma 09/22/2019  . Palliative care by specialist   . Goals of care, counseling/discussion   . Encephalopathy   . Acute CVA (cerebrovascular accident) (HCC) 09/14/2019  . Acute arterial ischemic stroke, multifocal, anterior circulation, right (HCC) 09/14/2019  . Severe comorbid illness   . AMS (altered mental status) 09/13/2019  . 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  . Hyperlipidemia 09/02/2017  . Hypertension 09/02/2017  . Chest pain 09/02/2017    Drake Leach, PT, DPT  03/13/2020, 12:27 PM  [facility] [address] [place], [place], [address] Phone: [phone]   Fax:  [phone]  Name: Christina Evans MRN: [id number] Date of Birth: January 13, 1961

## 2020-03-13 NOTE — Therapy (Signed)
[facility] [address] [place], [place], [address] Phone: [phone]   Fax:  [phone]  Occupational Therapy Treatment  Patient Details  Name: Christina Evans MRN: [id number] Date of Birth: 06/04/1961 Referring Provider (OT): Dr. Pearlean Brownie   Encounter Date: 03/13/2020  OT End of Session - 03/13/20 1138    Visit Number  6    Number of Visits  24    Date for OT Re-Evaluation  05/21/20    Authorization Type  [employer] medicare and medicaid as secondary. Pt will need PN every 10th visit    Authorization Time Period  90 days - 05/21/2020    Authorization - Visit Number  6    Authorization - Number of Visits  10    OT Start Time  0801    OT Stop Time  0845    OT Time Calculation (min)  44 min    Activity Tolerance  Patient tolerated treatment well       Past Medical History:  Diagnosis Date  . Asthma   . CVA (cerebral vascular accident) (HCC)   . Diabetes mellitus without complication [facility])     Past Surgical History:  Procedure Laterality Date  . IR ANGIO INTRA EXTRACRAN SEL COM CAROTID INNOMINATE BILAT MOD SED  09/15/2019  . IR ANGIO VERTEBRAL SEL VERTEBRAL UNI L MOD SED  09/15/2019    There were no vitals filed for this visit.  Subjective Assessment - 03/13/20 0805    Subjective   I dressed myself today (dtr confirms)    Patient is accompanied by:  Family member   dtr [name]   Pertinent History  Pt s/p recent R ACA CVA 09/13/19. 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    Currently in Pain?  No/denies                   OT Treatments/Exercises (OP) - 03/13/20 0001      ADLs   Overall ADLs  Pt and dtr report pt dressed herself this morning.  Addressed washing face and brushing teeth today  - pt able to wash face mod I and with cues use L hand to hold toothbrush for set up as well as use L hand to hold toothpaste and open with R hand (pt can also open one handed).  Discussed toileting schedule with pt and dtr - dtr reports pt is "holding " her urine and states she is unable to go when she on the toilet and is then later incontinent of urine. Dtr also reports pt appears unaware when she has to have a bowel movement.  Per dtr and chart, neurology ordered MRI to r/o lumbar radiculopathy in January but dtr unable to get pt there due to mobility issues. Dtr feels she can now safely transport pt - instructed dtr to call [facility] to see where they want her to go for MRI and number provided.  Dtr verbalizes she will do this - also discussed limited toileting schedule.        Neurological Re-education Exercises   Other Exercises 1  Neuro re ed to address sit to stand, functional ambulation with RW and hand orthosis as well as lateral weight shifting and stepping with RLE for forced paradaigm use of R side. Pt  with significant neglect of RUE and RLE as well as signficant postural orientation and alignment/control deficits however was able to functionally ambulate with RW and and orthosis with min guard and max cues.       Splinting   Splinting  Dtr reports pt has been wearing splint for about 30 minutes a day - discussed need to build pt up in order for pt to be able to wear all night. Written wearing schedule of 5 days given to pt and dtr to work toward building up to 4hours x2 in prep for full night time wear in 5 days. Dtr and pt verbalize understanding of schedule as well as how to monitor pt's tolreance of splint.              OT Education - 03/13/20 1135    Education Details  grooming, wearing schedule for splint,    Person(s) Educated  Patient;Child(ren)    Methods  Explanation;Demonstration;Handout    Comprehension  Verbalized understanding;Returned demonstration;Verbal cues  required   dtr to provide cues for initiation prn      OT Short Term Goals - 03/13/20 1136      OT SHORT TERM GOAL #1   Title  Pt and dtr will be mod  I with HEP to address ROM for RUE - 03/20/2020    Status  Achieved      OT SHORT TERM GOAL #2   Title  Pt and dtr will verbalize understanding for AE for cutting food on plate    Status  On-going      OT SHORT TERM GOAL #3   Title  Pt will be mod I with washing face and set up for bushing teeth.    Status  Achieved      OT SHORT TERM GOAL #4   Title  Pt will be mod a for UB dressing    Status  Achieved      OT SHORT TERM GOAL #5   Title  Pt will be mod a for LB dressing    Status  Achieved      OT SHORT TERM GOAL #6   Title  Pt will require mod a to hike pants    Status  Achieved      OT SHORT TERM GOAL #7   Title  Pt and dtr will verbalize understanding of toileting schedule to address incontinence    Status  On-going      OT SHORT TERM GOAL #8   Title  Pt and dtr will be mod with splint wear and care (resting hand splint at night)    Status  On-going        OT Long Term Goals - 03/11/20 0930      OT LONG TERM GOAL #1   Title  Pt and dtr will be mod I with upgraded home activities program to include focus ADL's, transitional movements and balance. - 05/01/2020    Status  On-going      OT LONG TERM GOAL #2   Title  Pt will be supervision for toilet transfers    Status  On-going      OT LONG TERM GOAL #3   Title  Pt will be no more than min a for hiking pants and toilet hygiene    Status  On-going      OT LONG TERM GOAL #4   Title  Pt will tolerate 110* of L shoulder flexion and 90* abduction during HEP for ease with ADL's with  more than " a little " pain based on faces.    Status  On-going      OT LONG TERM GOAL #5   Title  Pt will be supervision for simple snack prep    Status  On-going      OT LONG TERM GOAL #6   Title  Pt and dtr will complete FOTO outcome scale    Status  On-going             Plan - 03/13/20 1136    Clinical Impression Statement  Pt is progressing toward OT goals and is participating greatly at home for basic ADL tasks    OT Occupational Profile and History  Detailed Assessment- Review of Records and additional review of physical, cognitive, psychosocial history related to current functional performance    Occupational performance deficits (Please refer to evaluation for details):  ADL's;IADL's;Work;Leisure;Social Participation    Body Structure / Function / Physical Skills  ADL;Balance;Continence;Endurance;GMC;IADL;Mobility;Sensation;ROM;Pain;Strength;Tone;UE functional use    Rehab Potential  Good    Clinical Decision Making  Multiple treatment options, significant modification of task necessary    Comorbidities Affecting Occupational Performance:  Presence of comorbidities impacting occupational performance    Modification or Assistance to Complete Evaluation   Max significant modification of tasks or assist is necessary to complete    OT Frequency  2x / week    OT Duration  12 weeks    OT Treatment/Interventions  Self-care/ADL training;Aquatic Therapy;Electrical Stimulation;Ultrasound;Moist Heat;Therapeutic exercise;Neuromuscular education;Manual Therapy;Functional Mobility Training;DME and/or AE instruction;Passive range of motion;Patient/family education;Cognitive remediation/compensation;Therapeutic activities;Splinting;Balance training    Plan  check to make sure pt is wearing splint at night,  NMR for LUE/trunk, postural alignment and control, balance and functional mobility, consdier aquatic therapy    Consulted and Agree with Plan of Care  Patient;Family member/caregiver    Family Member Consulted  dtr       Patient will benefit from skilled therapeutic intervention in order to improve the following deficits and impairments:   Body Structure / Function / Physical Skills: ADL, Balance, Continence, Endurance, GMC, IADL, Mobility, Sensation,  ROM, Pain, Strength, Tone, UE functional use       Visit Diagnosis: Muscle weakness (generalized)  Unsteadiness on feet  Other symptoms and signs involving the nervous system  Abnormal posture  Spastic hemiplegia of left nondominant side as late effect of cerebral infarction (HCC)  Stiffness of left upper arm joint  Pain in left arm  Other symptoms and signs involving cognitive functions following cerebral infarction    Problem List Patient Active Problem List   Diagnosis Date Noted  . Cerebrovascular accident (CVA) due to thrombosis of precerebral artery (HCC) 11/30/2019  . 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 Spastic hemiparesis affecting nondominant side (HCC) 11/30/2019  . Chronic bilateral low back pain with sciatica 11/30/2019  . Metatarsal fracture 09/22/2019  . Depression 09/22/2019  . Asthma 09/22/2019  . Palliative care by specialist   . Goals of care, counseling/discussion   . Encephalopathy   . Acute CVA (cerebrovascular accident) (HCC) 09/14/2019  . Acute arterial ischemic stroke, multifocal, anterior circulation, right (HCC) 09/14/2019  . Severe comorbid illness   . AMS (altered mental status) 09/13/2019  . 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  . Hyperlipidemia 09/02/2017  . Hypertension 09/02/2017  . Chest pain 09/02/2017    Norton Pastel, OTR/L 03/13/2020, 11:40 AM  [facility] [address]  [place], [place], [address] Phone: [phone]   Fax:  [phone]  Name: Meleni Delahunt MRN: [id number] Date of Birth: 03-Sep-1961

## 2020-03-18 ENCOUNTER — Ambulatory Visit: Payer: Medicare Other | Admitting: Occupational Therapy

## 2020-03-18 ENCOUNTER — Encounter: Payer: Self-pay | Admitting: Occupational Therapy

## 2020-03-18 ENCOUNTER — Other Ambulatory Visit: Payer: Self-pay

## 2020-03-18 ENCOUNTER — Encounter: Payer: Self-pay | Admitting: Physical Medicine & Rehabilitation

## 2020-03-18 ENCOUNTER — Ambulatory Visit: Payer: Medicare Other | Admitting: Physical Therapy

## 2020-03-18 ENCOUNTER — Encounter: Payer: Self-pay | Admitting: Physical Therapy

## 2020-03-18 DIAGNOSIS — I69354 Hemiplegia and hemiparesis following cerebral infarction affecting left non-dominant side: Secondary | ICD-10-CM | POA: Diagnosis not present

## 2020-03-18 DIAGNOSIS — I69318 Other symptoms and signs involving cognitive functions following cerebral infarction: Secondary | ICD-10-CM

## 2020-03-18 DIAGNOSIS — R2681 Unsteadiness on feet: Secondary | ICD-10-CM

## 2020-03-18 DIAGNOSIS — M6281 Muscle weakness (generalized): Secondary | ICD-10-CM

## 2020-03-18 DIAGNOSIS — R293 Abnormal posture: Secondary | ICD-10-CM

## 2020-03-18 DIAGNOSIS — R29818 Other symptoms and signs involving the nervous system: Secondary | ICD-10-CM

## 2020-03-18 DIAGNOSIS — M25622 Stiffness of left elbow, not elsewhere classified: Secondary | ICD-10-CM

## 2020-03-18 DIAGNOSIS — M79602 Pain in left arm: Secondary | ICD-10-CM

## 2020-03-18 NOTE — Therapy (Signed)
[facility] [address] [place], [place], [address] Phone: [phone]   Fax:  [phone]  Occupational Therapy Treatment  Patient Details  Name: Christina Evans MRN: [id number] Date of Birth: 1961/06/10 Referring Provider (OT): Dr. Pearlean Brownie   Encounter Date: 03/18/2020  OT End of Session - 03/18/20 1223    Visit Number  7    Number of Visits  24    Date for OT Re-Evaluation  05/21/20    Authorization Type  [employer] medicare and medicaid as secondary. Pt will need PN every 10th visit    Authorization Time Period  90 days - 05/21/2020    Authorization - Visit Number  7    Authorization - Number of Visits  10    OT Start Time  0803    OT Stop Time  0845    OT Time Calculation (min)  42 min    Activity Tolerance  Patient tolerated treatment well       Past Medical History:  Diagnosis Date  . Asthma   . CVA (cerebral vascular accident) (HCC)   . Diabetes mellitus without complication [facility])     Past Surgical History:  Procedure Laterality Date  . IR ANGIO INTRA EXTRACRAN SEL COM CAROTID INNOMINATE BILAT MOD SED  09/15/2019  . IR ANGIO VERTEBRAL SEL VERTEBRAL UNI L MOD SED  09/15/2019    There were no vitals filed for this visit.  Subjective Assessment - 03/18/20 0804    Subjective   I had a really great weekend - I saw my grandkids    Pertinent History  Pt s/p recent R ACA CVA 09/13/19. 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    Currently in Pain?  No/denies                   OT Treatments/Exercises (OP) - 03/18/20 0001      Neurological Re-education Exercises   Other Exercises 1  Addressed sit to stand, postural alignment and control in standing as well as in lateral weight shifting - pt  needs mod facilitation to incorpoate L side into all activities.  Addressed functional ambulation using RW with hand orthosis as well as toe up brace for LLE. Pt with signifcant neglect of L side and needs max, step by step cuing to attend to LUE and LLE with functional ambulation.  Pt would benefit from more external support for LLE for improved postural alignment and control for functional ambulation, as well as to decrease spasiticty in LUE.  Discussed with PT - see PT note. Dtr reports they are still working on buidling up hours with splint - reinforced importance of getting pt to point where she could wear it all night and dtr verbalized understanding.                OT Short Term Goals - 03/13/20 1136      OT SHORT TERM GOAL #1   Title  Pt and dtr will be mod  I with HEP to address ROM for RUE - 03/20/2020    Status  Achieved      OT SHORT TERM GOAL #2   Title  Pt and dtr will verbalize understanding for AE for cutting food on plate    Status  On-going  OT SHORT TERM GOAL #3   Title  Pt will be mod I with washing face and set up for bushing teeth.    Status  Achieved      OT SHORT TERM GOAL #4   Title  Pt will be mod a for UB dressing    Status  Achieved      OT SHORT TERM GOAL #5   Title  Pt will be mod a for LB dressing    Status  Achieved      OT SHORT TERM GOAL #6   Title  Pt will require mod a to hike pants    Status  Achieved      OT SHORT TERM GOAL #7   Title  Pt and dtr will verbalize understanding of toileting schedule to address incontinence    Status  On-going      OT SHORT TERM GOAL #8   Title  Pt and dtr will be mod with splint wear and care (resting hand splint at night)    Status  On-going        OT Long Term Goals - 03/11/20 0930      OT LONG TERM GOAL #1   Title  Pt and dtr will be mod I with upgraded home activities program to include focus ADL's, transitional movements and balance. - 05/01/2020    Status  On-going      OT LONG TERM GOAL  #2   Title  Pt will be supervision for toilet transfers    Status  On-going      OT LONG TERM GOAL #3   Title  Pt will be no more than min a for hiking pants and toilet hygiene    Status  On-going      OT LONG TERM GOAL #4   Title  Pt will tolerate 110* of L shoulder flexion and 90* abduction during HEP for ease with ADL's with more than " a little " pain based on faces.    Status  On-going      OT LONG TERM GOAL #5   Title  Pt will be supervision for simple snack prep    Status  On-going      OT LONG TERM GOAL #6   Title  Pt and dtr will complete FOTO outcome scale    Status  On-going            Plan - 03/18/20 1222    Clinical Impression Statement  Pt is progressing toward goals - pt continued increased participation at home for basic tasks    OT Occupational Profile and History  Detailed Assessment- Review of Records and additional review of physical, cognitive, psychosocial history related to current functional performance    Occupational performance deficits (Please refer to evaluation for details):  ADL's;IADL's;Work;Leisure;Social Participation    Body Structure / Function / Physical Skills  ADL;Balance;Continence;Endurance;GMC;IADL;Mobility;Sensation;ROM;Pain;Strength;Tone;UE functional use    Rehab Potential  Good    Clinical Decision Making  Multiple treatment options, significant modification of task necessary    Comorbidities Affecting Occupational Performance:  Presence of comorbidities impacting occupational performance    Comorbidities impacting occupational performance description:  see above    OT Frequency  2x / week    OT Duration  12 weeks    OT Treatment/Interventions  Self-care/ADL training;Aquatic Therapy;Electrical Stimulation;Ultrasound;Moist Heat;Therapeutic exercise;Neuromuscular education;Manual Therapy;Functional Mobility Training;DME and/or AE instruction;Passive range of motion;Patient/family education;Cognitive remediation/compensation;Therapeutic  activities;Splinting;Balance training    Plan  check to make sure pt is wearing splint  at night,  NMR for LUE/trunk, postural alignment and control, balance and functional mobility, consdier aquatic therapy    Consulted and Agree with Plan of Care  Patient;Family member/caregiver    Family Member Consulted  dtr       Patient will benefit from skilled therapeutic intervention in order to improve the following deficits and impairments:   Body Structure / Function / Physical Skills: ADL, Balance, Continence, Endurance, GMC, IADL, Mobility, Sensation, ROM, Pain, Strength, Tone, UE functional use       Visit Diagnosis: Unsteadiness on feet  Muscle weakness (generalized)  Other symptoms and signs involving the nervous system  Abnormal posture  Spastic hemiplegia of left nondominant side as late effect of cerebral infarction (HCC)  Stiffness of left upper arm joint  Pain in left arm  Other symptoms and signs involving cognitive functions following cerebral infarction    Problem List Patient Active Problem List   Diagnosis Date Noted  . Cerebrovascular accident (CVA) due to thrombosis of precerebral artery (HCC) 11/30/2019  . 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 Spastic hemiparesis affecting nondominant side (HCC) 11/30/2019  . Chronic bilateral low back pain with sciatica 11/30/2019  . Metatarsal fracture 09/22/2019  . Depression 09/22/2019  . Asthma 09/22/2019  . Palliative care by specialist   . Goals of care, counseling/discussion   . Encephalopathy   . Acute CVA (cerebrovascular accident) (HCC) 09/14/2019  . Acute arterial ischemic stroke, multifocal, anterior circulation, right (HCC) 09/14/2019  . Severe comorbid illness   . AMS (altered mental status) 09/13/2019  . 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  . Hyperlipidemia 09/02/2017  . Hypertension 09/02/2017  . Chest pain  09/02/2017    Norton Pastel, OTR/L 03/18/2020, 12:25 PM  [facility] [address] [place], [place], [address] Phone: [phone]   Fax:  [phone]  Name: Christina Evans MRN: [id number] Date of Birth: [birth year]

## 2020-03-19 NOTE — Therapy (Signed)
[facility] [address] [place], [place], [address] Phone: [phone]   Fax:  [phone]  Physical Therapy Treatment  Patient Details  Name: Christina Evans MRN: [id number] Date of Birth: 02-15-1961 Referring Provider (PT): Garvin Fila, MD   Encounter Date: 03/18/2020  PT End of Session - 03/19/20 2154    Visit Number  7    Number of Visits  25    Date for PT Re-Evaluation  05/07/20   written for 60 day POC   Authorization Type  [employer] Medicare    PT Start Time  [phone]    PT Stop Time  0931    PT Time Calculation (min)  42 min    Equipment Utilized During Treatment  Gait belt    Activity Tolerance  Patient tolerated treatment well    Behavior During Therapy  [facility] for tasks assessed/performed   easily distractable at times      Past Medical History:  Diagnosis Date  . Asthma   . CVA (cerebral vascular accident) ([place])   . Diabetes mellitus without complication [facility])     Past Surgical History:  Procedure Laterality Date  . IR ANGIO INTRA EXTRACRAN SEL COM CAROTID INNOMINATE BILAT MOD SED  09/15/2019  . IR ANGIO VERTEBRAL SEL VERTEBRAL UNI L MOD SED  09/15/2019    There were no vitals filed for this visit.        03/18/20 0852  Symptoms/Limitations  Subjective Had a great weekend - saw her grandkids. Hasn't been wearing the splint as much at home - daughter has been busy.  Patient is accompained by: Family member (daughter [name])  Pertinent History PMH significant for large right MCA CVA, HTN, T2DM, HLD, hx of Left foot fifth metatarsal fracture with osteopenia, asthma.  Limitations Standing;Walking  How long can you stand comfortably? 1 minute (when daughter is changing her when she is wearing diapers).  How long can you walk comfortably? a few feet.  Diagnostic tests MRI brain with moderate sized acute to early subacute left ACA territory infarct andlarge chronic right MCA territory infarct.   Patient Stated Goals wants to get better, wants to be independent.  Pain Assessment  Currently in Pain? No/denies                 [facility] Adult PT Treatment/Exercise - 03/19/20 0001      Ambulation/Gait   Ambulation/Gait  Yes    Ambulation/Gait Assistance  4: Min assist    Ambulation/Gait Assistance Details  Took increased time today to initiate gait training during today's session due to pt having increased LUE spasticity in hand that made it difficult to place in L hand orthosis. Needed a couple of reps of weight shifting towards L and performing sit <> stands without RUE support to relax LUE to place in orthosis. Trialed L blue rocker AFO today with pt continuing to demonstrate increased L hip retraction and foot external rotation. Ambulated 73' total with verbal cues for pt to attend to LLE and to take big steps. Pt reporting that blue rocker AFO felt better on her foot vs. toe up brace that was trialed in OT session.     Ambulation Distance (Feet)  50 Feet    Assistive device  Rolling walker;Other (Comment)    Gait Pattern  Step-to pattern;Step-through pattern;Decreased stance time - left;Decreased hip/knee flexion - left;Decreased weight shift to left;Decreased dorsiflexion - left;Left circumduction;Left hip hike;Narrow base of support;Poor foot clearance - left  Ambulation Surface  Level;Indoor      Neuro Re-ed    Neuro Re-ed Details   Performed x10 reps of sit <> stands throughout session with focus on postural alignment and weight shifting towards L in standing. Use of black beam between BLE for wider BOS and to decr ADD. Use of manual facilitation for sit to stands for upright posture, anterior pelvic tilt and anterior weight shift to stand without use of RUE (pt placing RUE on therapist), pt reporting initially she is fearful and is more fatigued from previous OT session, at times it took a couple of attempts to stand). Once in standing worked on lateral weight shifting to L  with no UE support with tactile/verbal cues and to relax RUE. With focus of sitting back to mat table slowly and manual facilitation to stay weight shifted to L                PT Short Term Goals - 03/19/20 2156      PT SHORT TERM GOAL #1   Title  Pt will undergo further assessment of L AFO for gait, mobility, and transfers. ALL STGS DUE 03/13/20.    Baseline  assessed blue rocker AFO on 03/18/20 - unable to further assess other braces due to pt having incr spasticity in LUE and unable to put hand in handsplint for gait with RW    Time  5   due to delay in scheduling   Period  Weeks    Status  Partially Met    Target Date  03/13/20      PT SHORT TERM GOAL #2   Title  Pt will perform stand step transfers with supervision in order to decr caregiver burder.    Baseline  stand step with min guard    Time  5    Period  Weeks    Status  Not Met      PT SHORT TERM GOAL #3   Title  Pt will tolerate static standing for at least 3 minutes with single UE support in order to improve tolerance for ADLs.    Time  5    Period  Weeks    Status  Achieved      PT SHORT TERM GOAL #4   Title  Pt will ambulate at least 45' with hemiwalker and min guard in order to improve functional mobility.    Baseline  50' with RW and L hand splint and blue rocker L AFO    Time  5    Period  Weeks    Status  Not Met      PT SHORT TERM GOAL #5   Title  Pt and pt's daughter will be independent with initial HEP for weight bearing/lower extremity stretching.    Time  5    Period  Weeks    Status  Achieved      Revised/on-going STGs:  PT Short Term Goals - 03/19/20 2156      PT SHORT TERM GOAL #1   Title  Pt will undergo further assessment of L AFO for gait, mobility, and transfers. ALL STGS DUE 04/16/20    Baseline  assessed blue rocker AFO on 03/18/20 - unable to further assess other braces due to pt having incr spasticity in LUE and unable to put hand in handsplint for gait with RW    Time  4   due  to delay in scheduling   Period  Weeks    Status  On-going  Target Date  04/16/20      PT SHORT TERM GOAL #2   Title  Pt will perform stand step transfers with supervision in order to decr caregiver burder.    Baseline  stand step with min guard    Time  4    Period  Weeks    Status  On-going      PT SHORT TERM GOAL #3   Title  Pt will tolerate static standing for at least 5 minutes with no UE/single UE support with min guar in order to improve tolerance for ADLs.    Time  4    Period  Weeks    Status  Revised      PT SHORT TERM GOAL #4   Title  Pt will ambulate at least 100' with RW, L AFO and min guard in order to improve functional mobility.    Baseline  50' with RW and L hand splint and blue rocker L AFO    Time  4    Period  Weeks    Status  Revised      PT SHORT TERM GOAL #5   Title  Pt will undergo assessment of gait speed when appropriate.    Time  4    Period  Weeks    Status  New        PT Long Term Goals - 02/07/20 1144      PT LONG TERM GOAL #1   Title  Pt will perform stand step transfers with no AD with mod I in order to decr caregiver burden. ALL LTGS DUE 05/08/20    Time  13    Period  Weeks    Status  New    Target Date  05/08/20      PT LONG TERM GOAL #2   Title  Pt will tolerate static standing for at least 5 minutes with single UE vs. no UE support in order to improve tolerance for ADLs.    Time  13    Period  Weeks    Status  New      PT LONG TERM GOAL #3   Title  Pt will ambulate at least 200' with hemiwalker vs. LRAD and supervision in order to improve functional houeshold mobility.    Time  13    Period  Weeks    Status  New      PT LONG TERM GOAL #4   Title  Pt will perform at least 5 sit <> stand transfers from mat table and standard height chair with mod I with no UE support vs. single UE support in order to demo improved functional transfers    Time  13    Period  Weeks    Status  New      PT LONG TERM GOAL #5   Title  Pt and  pt's daughter will be independent with final HEP for weight bearing/lower extremity stretching/strengthening.    Time  13    Period  Weeks    Status  New            Plan - 03/19/20 2227    Clinical Impression Statement  Focus of today's skilled session was NMR for LLE weight shifting and gait training with L blue rocker AFO. Pt continues with increased LUE spasticity and took increased time today to decrease in order to place LUE in hand orthosis for gait. Pt needs max verbal cues during gait to attend to LLE and to  take big steps. Pt demonstrated improved foot clearance with blue rocker AFO, but still had incr external rotation due to weakness. Will continue to trial additional AFOs during gait to determine which would be best to improve functional ambulation and decr spasticity. Pt has not yet met STGs #2 and #4 - pt needs min guard currently with stand step transfers and needs min A for gait with RW and max cues. STGs revised/on-going as appropriate.    Personal Factors and Comorbidities  Comorbidity 3+;Past/Current Experience;Time since onset of injury/illness/exacerbation;Behavior Pattern    Comorbidities  PMH significant for large right MCA CVA, HTN, T2DM, HLD, hx of Left foot fifth metatarsal fracture with osteopenia, asthma.    Examination-Activity Limitations  Stand;Locomotion Level;Transfers;Toileting;Continence;Dressing    Examination-Participation Restrictions  Community Activity    Stability/Clinical Decision Making  Evolving/Moderate complexity    Rehab Potential  Good    PT Frequency  2x / week    PT Duration  12 weeks    PT Treatment/Interventions  ADLs/Self Care Home Management;Aquatic Therapy;Electrical Stimulation;DME Instruction;Gait training;Functional mobility training;Neuromuscular re-education;Balance training;Therapeutic exercise;Therapeutic activities;Patient/family education;Orthotic Fit/Training;Passive range of motion;Energy conservation    PT Next Visit Plan   continue to trial AFOs. gait training with RW, hear back from physical medicine referral? standing balance/weight shifting. anything in a mini squat position to break up tone/spasticity. continue with LLE stretching/strengthening, assess for proper AFO (pt with incr extension and decr foot clearance during gait due to spasticity).    Consulted and Agree with Plan of Care  Patient;Family member/caregiver       Patient will benefit from skilled therapeutic intervention in order to improve the following deficits and impairments:  Abnormal gait, Decreased activity tolerance, Decreased coordination, Decreased balance, Decreased cognition, Decreased endurance, Decreased knowledge of use of DME, Decreased range of motion, Decreased strength, Difficulty walking, Impaired tone, Impaired UE functional use, Postural dysfunction, Impaired sensation  Visit Diagnosis: Unsteadiness on feet  Muscle weakness (generalized)  Other symptoms and signs involving the nervous system  Abnormal posture     Problem List Patient Active Problem List   Diagnosis Date Noted  . Cerebrovascular accident (CVA) due to thrombosis of precerebral artery ([place]) 11/30/2019  . 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 Spastic hemiparesis affecting nondominant side ([place]) 11/30/2019  . Chronic bilateral low back pain with sciatica 11/30/2019  . Metatarsal fracture 09/22/2019  . Depression 09/22/2019  . Asthma 09/22/2019  . Palliative care by specialist   . Goals of care, counseling/discussion   . Encephalopathy   . Acute CVA (cerebrovascular accident) ([place]) 09/14/2019  . Acute arterial ischemic stroke, multifocal, anterior circulation, right ([place]) 09/14/2019  . Severe comorbid illness   . AMS (altered mental status) 09/13/2019  . Controlled diabetes mellitus type 2 with complications ([place]) 61/95/0932  . Hyperlipidemia 09/02/2017  . Hypertension 09/02/2017  . Chest pain  09/02/2017    Arliss Journey, PT, DPT  03/19/2020, 10:32 PM  [facility] [address] [place], [place], [address] Phone: [phone]   Fax:  [phone]  Name: Christina Evans MRN: [id number] Date of Birth: [birth year]

## 2020-03-20 ENCOUNTER — Encounter: Payer: Self-pay | Admitting: Occupational Therapy

## 2020-03-20 ENCOUNTER — Encounter: Payer: Self-pay | Admitting: Physical Therapy

## 2020-03-20 ENCOUNTER — Other Ambulatory Visit: Payer: Self-pay

## 2020-03-20 ENCOUNTER — Ambulatory Visit: Payer: Medicare Other | Admitting: Occupational Therapy

## 2020-03-20 ENCOUNTER — Ambulatory Visit: Payer: Medicare Other | Admitting: Physical Therapy

## 2020-03-20 DIAGNOSIS — M25622 Stiffness of left elbow, not elsewhere classified: Secondary | ICD-10-CM

## 2020-03-20 DIAGNOSIS — M79602 Pain in left arm: Secondary | ICD-10-CM

## 2020-03-20 DIAGNOSIS — M6281 Muscle weakness (generalized): Secondary | ICD-10-CM

## 2020-03-20 DIAGNOSIS — R29818 Other symptoms and signs involving the nervous system: Secondary | ICD-10-CM

## 2020-03-20 DIAGNOSIS — R2681 Unsteadiness on feet: Secondary | ICD-10-CM

## 2020-03-20 DIAGNOSIS — I69318 Other symptoms and signs involving cognitive functions following cerebral infarction: Secondary | ICD-10-CM

## 2020-03-20 DIAGNOSIS — R293 Abnormal posture: Secondary | ICD-10-CM

## 2020-03-20 DIAGNOSIS — I69354 Hemiplegia and hemiparesis following cerebral infarction affecting left non-dominant side: Secondary | ICD-10-CM | POA: Diagnosis not present

## 2020-03-20 NOTE — Therapy (Signed)
[facility] [address] [place], [place], [address] Phone: [phone]   Fax:  [phone]  Occupational Therapy Treatment  Patient Details  Name: Christina Evans MRN: [id number] Date of Birth: 09/22/1961 Referring Provider (OT): Dr. Leonie Man   Encounter Date: 03/20/2020  OT End of Session - 03/20/20 1208    Visit Number  8    Number of Visits  24    Date for OT Re-Evaluation  05/21/20    Authorization Type  [employer] medicare and medicaid as secondary. Pt will need PN every 10th visit    Authorization Time Period  90 days - 05/21/2020    Authorization - Visit Number  8    Authorization - Number of Visits  10    OT Start Time  0802    OT Stop Time  0845    OT Time Calculation (min)  43 min    Activity Tolerance  Patient tolerated treatment well       Past Medical History:  Diagnosis Date  . Asthma   . CVA (cerebral vascular accident) ([place])   . Diabetes mellitus without complication [facility])     Past Surgical History:  Procedure Laterality Date  . IR ANGIO INTRA EXTRACRAN SEL COM CAROTID INNOMINATE BILAT MOD SED  09/15/2019  . IR ANGIO VERTEBRAL SEL VERTEBRAL UNI L MOD SED  09/15/2019    There were no vitals filed for this visit.  Subjective Assessment - 03/20/20 0810    Subjective   I can't pay attention very good    Patient is accompanied by:  Family member   dtr   Pertinent History  Pt s/p recent R ACA CVA 09/13/19. 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    Currently in Pain?  No/denies                   OT Treatments/Exercises (OP) - 03/20/20 0001      Neurological Re-education Exercises   Other Exercises 1  Issued hand orthosis for RW and adapted placement for oprtimal pt use.  Neuro  re ed to address postural alignment, postural orienation and postural control in sitting, dynamic sitting, sit to squat, sit to stand, standing with weight shifting without UE support. Pt with signficant perceptual deficits in terms of finding midline, body in space, body relative objects. Pt also demonstrates signifcant difficulty with attention and L neglect.  Pt requires signficant repetition and simple instruction for performance improvement however pt with improving underlying motor movement                OT Short Term Goals - 03/13/20 1136      OT SHORT TERM GOAL #1   Title  Pt and dtr will be mod  I with HEP to address ROM for RUE - 03/20/2020    Status  Achieved      OT SHORT TERM GOAL #2   Title  Pt and dtr will verbalize understanding for AE for cutting food on plate    Status  On-going      OT SHORT TERM GOAL #3   Title  Pt will be mod I with washing face and set up for bushing teeth.    Status  Achieved      OT SHORT TERM  GOAL #4   Title  Pt will be mod a for UB dressing    Status  Achieved      OT SHORT TERM GOAL #5   Title  Pt will be mod a for LB dressing    Status  Achieved      OT SHORT TERM GOAL #6   Title  Pt will require mod a to hike pants    Status  Achieved      OT SHORT TERM GOAL #7   Title  Pt and dtr will verbalize understanding of toileting schedule to address incontinence    Status  On-going      OT SHORT TERM GOAL #8   Title  Pt and dtr will be mod with splint wear and care (resting hand splint at night)    Status  On-going        OT Long Term Goals - 03/11/20 0930      OT LONG TERM GOAL #1   Title  Pt and dtr will be mod I with upgraded home activities program to include focus ADL's, transitional movements and balance. - 05/01/2020    Status  On-going      OT LONG TERM GOAL #2   Title  Pt will be supervision for toilet transfers    Status  On-going      OT LONG TERM GOAL #3   Title  Pt will be no more than min a for hiking pants  and toilet hygiene    Status  On-going      OT LONG TERM GOAL #4   Title  Pt will tolerate 110* of L shoulder flexion and 90* abduction during HEP for ease with ADL's with more than " a little " pain based on faces.    Status  On-going      OT LONG TERM GOAL #5   Title  Pt will be supervision for simple snack prep    Status  On-going      OT LONG TERM GOAL #6   Title  Pt and dtr will complete FOTO outcome scale    Status  On-going            Plan - 03/20/20 1208    Clinical Impression Statement  Pt with slow but steady progress toward OT goals - pt to see physiatry next week.    OT Occupational Profile and History  Detailed Assessment- Review of Records and additional review of physical, cognitive, psychosocial history related to current functional performance    Occupational performance deficits (Please refer to evaluation for details):  ADL's;IADL's;Work;Leisure;Social Participation    Body Structure / Function / Physical Skills  ADL;Balance;Continence;Endurance;GMC;IADL;Mobility;Sensation;ROM;Pain;Strength;Tone;UE functional use    Rehab Potential  Good    Clinical Decision Making  Multiple treatment options, significant modification of task necessary    Comorbidities Affecting Occupational Performance:  Presence of comorbidities impacting occupational performance    Comorbidities impacting occupational performance description:  see above    Modification or Assistance to Complete Evaluation   Max significant modification of tasks or assist is necessary to complete    OT Frequency  2x / week    OT Duration  12 weeks    OT Treatment/Interventions  Self-care/ADL training;Aquatic Therapy;Electrical Stimulation;Ultrasound;Moist Heat;Therapeutic exercise;Neuromuscular education;Manual Therapy;Functional Mobility Training;DME and/or AE instruction;Passive range of motion;Patient/family education;Cognitive remediation/compensation;Therapeutic activities;Splinting;Balance training     Plan  check to make sure pt is wearing splint at night,  NMR for LUE/trunk, postural alignment and control, balance and functional mobility,  consdier aquatic therapy    Consulted and Agree with Plan of Care  Patient;Family member/caregiver    Family Member Consulted  dtr       Patient will benefit from skilled therapeutic intervention in order to improve the following deficits and impairments:   Body Structure / Function / Physical Skills: ADL, Balance, Continence, Endurance, GMC, IADL, Mobility, Sensation, ROM, Pain, Strength, Tone, UE functional use       Visit Diagnosis: Unsteadiness on feet  Muscle weakness (generalized)  Other symptoms and signs involving the nervous system  Abnormal posture  Spastic hemiplegia of left nondominant side as late effect of cerebral infarction ([facility])  Stiffness of left upper arm joint  Pain in left arm  Other symptoms and signs involving cognitive functions following cerebral infarction    Problem List Patient Active Problem List   Diagnosis Date Noted  . Cerebrovascular accident (CVA) due to thrombosis of precerebral artery ([facility]) 11/30/2019  . 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 Spastic hemiparesis affecting nondominant side ([facility]) 11/30/2019  . Chronic bilateral low back pain with sciatica 11/30/2019  . Metatarsal fracture 09/22/2019  . Depression 09/22/2019  . Asthma 09/22/2019  . Palliative care by specialist   . Goals of care, counseling/discussion   . Encephalopathy   . Acute CVA (cerebrovascular accident) ([facility]) 09/14/2019  . Acute arterial ischemic stroke, multifocal, anterior circulation, right ([facility]) 09/14/2019  . Severe comorbid illness   . AMS (altered mental status) 09/13/2019  . 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  . Hyperlipidemia 09/02/2017  . Hypertension 09/02/2017  . Chest pain 09/02/2017    Norton Pastel,  OTR/L 03/20/2020, 12:10 PM  [facility] [address] [place], [place], [address] Phone: [phone]   Fax:  [phone]  Name: Christina Evans MRN: [id number] Date of Birth: Mar 12, 1961

## 2020-03-21 ENCOUNTER — Ambulatory Visit: Payer: Medicare Other

## 2020-03-21 DIAGNOSIS — R41841 Cognitive communication deficit: Secondary | ICD-10-CM

## 2020-03-21 DIAGNOSIS — I69354 Hemiplegia and hemiparesis following cerebral infarction affecting left non-dominant side: Secondary | ICD-10-CM | POA: Diagnosis not present

## 2020-03-21 NOTE — Therapy (Signed)
[facility] [address] [place], [place], [address] Phone: [phone]   Fax:  [phone]  Physical Therapy Treatment  Patient Details  Name: Christina Evans MRN: [id number] Date of Birth: September 28, 1961 Referring Provider (PT): Micki Riley, MD   Encounter Date: 03/20/2020  PT End of Session - 03/21/20 0919    Visit Number  8    Number of Visits  25    Date for PT Re-Evaluation  05/07/20   written for 60 day POC   Authorization Type  [employer]    PT Start Time  0848    PT Stop Time  0931    PT Time Calculation (min)  43 min    Equipment Utilized During Treatment  Gait belt    Activity Tolerance  Patient tolerated treatment well    Behavior During Therapy  [facility] for tasks assessed/performed   easily distractable at times      Past Medical History:  Diagnosis Date  . Asthma   . CVA (cerebral vascular accident) (HCC)   . Diabetes mellitus without complication [facility])     Past Surgical History:  Procedure Laterality Date  . IR ANGIO INTRA EXTRACRAN SEL COM CAROTID INNOMINATE BILAT MOD SED  09/15/2019  . IR ANGIO VERTEBRAL SEL VERTEBRAL UNI L MOD SED  09/15/2019    There were no vitals filed for this visit.  Subjective Assessment - 03/20/20 0850    Subjective  Everything is the same, feeling good today.    Patient is accompained by:  Family member   daughter [name]   Pertinent History  PMH significant for large right MCA CVA, HTN, T2DM, HLD, hx of Left foot fifth metatarsal fracture with osteopenia, asthma.    Limitations  Standing;Walking    How long can you stand comfortably?  1 minute (when daughter is changing her when she is wearing diapers).    How long can you walk comfortably?  a few feet.    Diagnostic tests  MRI brain with moderate sized acute to early subacute left ACA territory infarct andlarge chronic right MCA territory infarct.    Patient Stated Goals  wants to get better, wants to be  independent.    Currently in Pain?  No/denies                       [facility] Adult PT Treatment/Exercise - 03/21/20 0001      Neuro Re-ed    Neuro Re-ed Details   L posterior Ott[place]ock AFO donned for session today. Unable to perform gait training today due to increased LUE spasticity - unable to place L hand in hand orthosis without discomfort. Performed multiple reps of sit <> stands throughout session with focus on postural alignment, posture, and forward weight shift using manual facilitation. Use of black beam between BLE for wider BOS and to decr ADD and improve weight shift towards LLE. Pt at times states that she is too tired from OT session to stand - will make it halfway through transfer and then sit back onto mat. Once in standing with no UE support performed M/L weight shift with therapist providing min/mod A anteriorly and tactile/verbal cues for weight shift. Verbal and tactile cues in standing to relax RUE. With therapist to pt's L blocking L knee as appropriate with mirror anteriorly for visual feedback, cues to stand tall and weight shift through hips towards therapist for additional trunk elongation on L side, progressing to weight shifting towards  L and then taking a step forward/backward with RLE.               PT Short Term Goals - 03/19/20 2156      PT SHORT TERM GOAL #1   Title  Pt will undergo further assessment of L AFO for gait, mobility, and transfers. ALL STGS DUE 04/16/20    Baseline  assessed blue rocker AFO on 03/18/20 - unable to further assess other braces due to pt having incr spasticity in LUE and unable to put hand in handsplint for gait with RW    Time  4   due to delay in scheduling   Period  Weeks    Status  On-going    Target Date  04/16/20      PT SHORT TERM GOAL #2   Title  Pt will perform stand step transfers with supervision in order to decr caregiver burder.    Baseline  stand step with min guard    Time  4    Period  Weeks     Status  On-going      PT SHORT TERM GOAL #3   Title  Pt will tolerate static standing for at least 5 minutes with no UE/single UE support with min guar in order to improve tolerance for ADLs.    Time  4    Period  Weeks    Status  Revised      PT SHORT TERM GOAL #4   Title  Pt will ambulate at least 100' with RW, L AFO and min guard in order to improve functional mobility.    Baseline  50' with RW and L hand splint and blue rocker L AFO    Time  4    Period  Weeks    Status  Revised      PT SHORT TERM GOAL #5   Title  Pt will undergo assessment of gait speed when appropriate.    Time  4    Period  Weeks    Status  New        PT Long Term Goals - 02/07/20 1144      PT LONG TERM GOAL #1   Title  Pt will perform stand step transfers with no AD with mod I in order to decr caregiver burden. ALL LTGS DUE 05/08/20    Time  13    Period  Weeks    Status  New    Target Date  05/08/20      PT LONG TERM GOAL #2   Title  Pt will tolerate static standing for at least 5 minutes with single UE vs. no UE support in order to improve tolerance for ADLs.    Time  13    Period  Weeks    Status  New      PT LONG TERM GOAL #3   Title  Pt will ambulate at least 200' with hemiwalker vs. LRAD and supervision in order to improve functional houeshold mobility.    Time  13    Period  Weeks    Status  New      PT LONG TERM GOAL #4   Title  Pt will perform at least 5 sit <> stand transfers from mat table and standard height chair with mod I with no UE support vs. single UE support in order to demo improved functional transfers    Time  13    Period  Weeks    Status  New      PT LONG TERM GOAL #5   Title  Pt and pt's daughter will be independent with final HEP for weight bearing/lower extremity stretching/strengthening.    Time  13    Period  Weeks    Status  New            Plan - 03/21/20 2725    Clinical Impression Statement  Focus of today's skilled session was standing NMR for  LLE weight shifting and postural alignment and sit <> stand training. Pt with increased LUE spasticity - especially in hand, unable to place L hand in L hand orthosis to initiate gait training today. However, performed all standing activities today with L PLS Ott[place]ock AFO to assist with proper alignment. Pt is easily distractable and needs frequent re-direction to the task at hand. Pt with poor awareness of where her body is in space when working on weight shifting towards L, however pt did have slight improvement with use of mirror as a visual cue. Will continue to progress towards LTGs.    Personal Factors and Comorbidities  Comorbidity 3+;Past/Current Experience;Time since onset of injury/illness/exacerbation;Behavior Pattern    Comorbidities  PMH significant for large right MCA CVA, HTN, T2DM, HLD, hx of Left foot fifth metatarsal fracture with osteopenia, asthma.    Examination-Activity Limitations  Stand;Locomotion Level;Transfers;Toileting;Continence;Dressing    Examination-Participation Restrictions  Community Activity    Stability/Clinical Decision Making  Evolving/Moderate complexity    Rehab Potential  Good    PT Frequency  2x / week    PT Duration  12 weeks    PT Treatment/Interventions  ADLs/Self Care Home Management;Aquatic Therapy;Electrical Stimulation;DME Instruction;Gait training;Functional mobility training;Neuromuscular re-education;Balance training;Therapeutic exercise;Therapeutic activities;Patient/family education;Orthotic Fit/Training;Passive range of motion;Energy conservation    PT Next Visit Plan  continue to trial AFOs. gait training with RW, hear back from physical medicine referral? use of balance master for weight shifting, standing balance/weight shifting. anything in a mini squat position to break up tone/spasticity.    Consulted and Agree with Plan of Care  Patient;Family member/caregiver       Patient will benefit from skilled therapeutic intervention in order to  improve the following deficits and impairments:  Abnormal gait, Decreased activity tolerance, Decreased coordination, Decreased balance, Decreased cognition, Decreased endurance, Decreased knowledge of use of DME, Decreased range of motion, Decreased strength, Difficulty walking, Impaired tone, Impaired UE functional use, Postural dysfunction, Impaired sensation  Visit Diagnosis: Unsteadiness on feet  Muscle weakness (generalized)  Other symptoms and signs involving the nervous system  Abnormal posture     Problem List Patient Active Problem List   Diagnosis Date Noted  . Cerebrovascular accident (CVA) due to thrombosis of precerebral artery ([place]) 11/30/2019  . 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 Spastic hemiparesis affecting nondominant side ([place]) 11/30/2019  . Chronic bilateral low back pain with sciatica 11/30/2019  . Metatarsal fracture 09/22/2019  . Depression 09/22/2019  . Asthma 09/22/2019  . Palliative care by specialist   . Goals of care, counseling/discussion   . Encephalopathy   . Acute CVA (cerebrovascular accident) ([place]) 09/14/2019  . Acute arterial ischemic stroke, multifocal, anterior circulation, right ([place]) 09/14/2019  . Severe comorbid illness   . AMS (altered mental status) 09/13/2019  . Controlled diabetes mellitus type 2 with complications ([place]) 36/64/4034  . Hyperlipidemia 09/02/2017  . Hypertension 09/02/2017  . Chest pain 09/02/2017    Arliss Journey, PT, DPT  03/21/2020, 2:33 PM  [facility]  [facility] [address] [place], [place], [address] Phone: [phone]   Fax:  [phone]  Name: Christina Evans MRN: [id number] Date of Birth: 1961-07-29

## 2020-03-21 NOTE — Patient Instructions (Signed)
  Please encourage Christina Evans to perform any tasks that she would need to sustain her attention for more than 60 seconds.  She may need help being redirected back to the task.

## 2020-03-21 NOTE — Therapy (Signed)
[facility] [address] [place], [place], [address] Phone: [phone]   Fax:  [phone]  Speech Language Pathology Evaluation  Patient Details  Name: Christina Evans MRN: [id number] Date of Birth: 1961/11/18 Referring Provider (SLP): Delia Heady, MD   Encounter Date: 03/21/2020  End of Session - 03/21/20 1700    Visit Number  1    Number of Visits  17    Date for SLP Re-Evaluation  06/19/20   90 days   SLP Start Time  1105    SLP Stop Time   1146    SLP Time Calculation (min)  41 min    Activity Tolerance  Patient tolerated treatment well       Past Medical History:  Diagnosis Date  . Asthma   . CVA (cerebral vascular accident) (HCC)   . Diabetes mellitus without complication [facility])     Past Surgical History:  Procedure Laterality Date  . IR ANGIO INTRA EXTRACRAN SEL COM CAROTID INNOMINATE BILAT MOD SED  09/15/2019  . IR ANGIO VERTEBRAL SEL VERTEBRAL UNI L MOD SED  09/15/2019    There were no vitals filed for this visit.  Subjective Assessment - 03/21/20 1115    Subjective  "I was doing my cleaning and my errands, and my cooking."    Patient is accompained by:  Family member   [name] -dtr   Currently in Pain?  No/denies         SLP Evaluation [facility] - 03/21/20 1115      SLP Visit Information   SLP Received On  03/21/20    Referring Provider (SLP)  Delia Heady, MD    Onset Date  October 2020    Medical Diagnosis  R ACA CVA      Subjective   Patient/Family Stated Goal  "Do things better."      General Information   HPI  Hx of rt MCA CVA in 2008, pt had recovered back and was driving, cooking, etc. Rt ACA CVA October 2020 with very little HHST afterwards (<5 visits, reportedly).       Prior Functional Status   Cognitive/Linguistic Baseline  Within functional limits    Type of Home  House    Vocation  Unemployed      Cognition   Overall Cognitive Status  Impaired/Different from  baseline    Area of Impairment  Orientation;Attention;Memory;Following commands;Safety/judgement;Awareness;Problem solving    Orientation Level  Place    General Comments  oriented to person, and place with cues    Current Attention Level  Sustained;Selective   likely selective attention is deficient   Attention Comments  internal distraction present - SLP presented simple stimulus and pt looked out the window. After 6 seconds pt looked back at SLP and stated, "Are you waiting for me?" On letter ID in a letter string pt indicated first letter (within 4 seconds of initiating the task), but the next letter 8-10 seconds into the task was not ID'd - or any other targets for the remainder of the task (40 seconds). Pt unable to complete the serial 7 subtraction, nor the reasoning sections due to the significant deficits in attention.     Memory  Decreased short-term memory;Decreased recall of precautions    Memory Comments  1/6 words recalled immediately, 0/6 after 5 minutes     Following Commands  Follows one step commands inconsistently    Safety/Judgement  Decreased awareness of safety;Decreased awareness of deficits    Awareness  Intellectual    Awareness Comments  pt only demonstrated intellectual awareness when provided with question re: premorbid skills    Problem Solving Comments  due to decr'd attention    Behaviors  Other (comment)   usually looking out window or over SLP shoulder into hall     Auditory Comprehension   Overall Auditory Comprehension  Appears within functional limits for tasks assessed      Verbal Expression   Overall Verbal Expression  Appears within functional limits for tasks assessed      Oral Motor/Sensory Function   Overall Oral Motor/Sensory Function  Appears within functional limits for tasks assessed      Motor Speech   Overall Motor Speech  Appears within functional limits for tasks assessed      Standardized Assessments   Standardized Assessments    Montreal Cognitive Assessment [facility])    Montreal Cognitive Assessment (MOCA)   4/30                      SLP Education - 03/21/20 1659    Education Details  home tasks    Person(s) Educated  Patient;Child(ren)    Methods  Explanation;Demonstration    Comprehension  Verbalized understanding       SLP Short Term Goals - 03/21/20 1707      SLP SHORT TERM GOAL #1   Title  pt will demonstrate attention appropriate to benefit from continued speech therapy over 4 sessions    Time  4    Period  Weeks   or 9 visits, for all STGs   Status  New      SLP SHORT TERM GOAL #2   Title  pt will demo selective attention to simple linguistic task for 90 seconds with occasionl min A x 3 sessions    Time  4    Period  Weeks    Status  New       SLP Long Term Goals - 03/21/20 1709      SLP LONG TERM GOAL #1   Title  pt will demo sustained/selective attention for a simple 5 minute linguistic task with rare min A over 3 sessions    Time  8    Period  Weeks   or 17 total sessions, for all LTGS   Status  New      SLP LONG TERM GOAL #2   Title  pt will demonstrate attention appropriate for continued ST over 8 sessions    Time  8    Period  Weeks    Status  New       Plan - 03/21/20 1703    Clinical Impression Statement  Pt presents with severe cognitive linguistic deficits stemming primarily from significant attentional deficits - pt scored 4/30 on the [facility] Cognitive Assessment. She will do well to do tasks at home which are familiar and functional that she could perform for aprox 60 seconds. SLP told daughter that pt may need to be redirected to task. Pt would benefit from skilled ST targeting attention. Pt may well benefit from attention med if not already prescribed. If an attention med would be initiated, re-evaluation using another cognitive insttrument may be necessary, and goals modified as needed.    Speech Therapy Frequency  2x / week    Duration  --   8 weeks, or  17 total sessions   Treatment/Interventions  Cognitive reorganization;Internal/external aids;Patient/family education;Compensatory strategies;SLP instruction and feedback;Cueing hierarchy;Functional tasks;Environmental controls;Multimodal communcation approach  Potential to Achieve Goals  Fair    Potential Considerations  Severity of impairments;Cooperation/participation level;Ability to learn/carryover information       Patient will benefit from skilled therapeutic intervention in order to improve the following deficits and impairments:   Cognitive communication deficit - Plan: SLP plan of care cert/re-cert    Problem List Patient Active Problem List   Diagnosis Date Noted  . Cerebrovascular accident (CVA) due to thrombosis of precerebral artery ([place]) 11/30/2019  . 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 Spastic hemiparesis affecting nondominant side ([place]) 11/30/2019  . Chronic bilateral low back pain with sciatica 11/30/2019  . Metatarsal fracture 09/22/2019  . Depression 09/22/2019  . Asthma 09/22/2019  . Palliative care by specialist   . Goals of care, counseling/discussion   . Encephalopathy   . Acute CVA (cerebrovascular accident) ([place]) 09/14/2019  . Acute arterial ischemic stroke, multifocal, anterior circulation, right ([place]) 09/14/2019  . Severe comorbid illness   . AMS (altered mental status) 09/13/2019  . Controlled diabetes mellitus type 2 with complications ([place]) 62/83/1517  . Hyperlipidemia 09/02/2017  . Hypertension 09/02/2017  . Chest pain 09/02/2017    [facility] ,[place], [place]  03/21/2020, 5:14 PM  [facility] [address] [place], [place], [address] Phone: [phone]   Fax:  [phone]  Name: Christina Evans MRN: [id number] Date of Birth: 05-05-1961

## 2020-03-25 ENCOUNTER — Telehealth: Payer: Self-pay | Admitting: *Deleted

## 2020-03-25 ENCOUNTER — Ambulatory Visit: Payer: Medicare Other | Admitting: Physical Therapy

## 2020-03-25 ENCOUNTER — Ambulatory Visit: Payer: Medicare Other | Admitting: Occupational Therapy

## 2020-03-25 NOTE — Telephone Encounter (Signed)
Patient has a Xeomin appointment on 04/09/2020.  I called [employer] and spoke to [facility].  She states that C5885 and 02774 are valid and billable.  They do not require PA.  Patients SP is [facility]. [phone].  I called [facility] and spoke to [place].  She states that a PA is required through the pharmacy.  We initiated the PA and she faxed over a form to be filled out and faxed back.  Ref# for this call is [id number]

## 2020-03-25 NOTE — Telephone Encounter (Signed)
Form received, completed and faxed to [facility]  [phone].

## 2020-03-27 ENCOUNTER — Ambulatory Visit: Payer: Medicare Other | Admitting: Occupational Therapy

## 2020-03-27 ENCOUNTER — Ambulatory Visit: Payer: Medicare Other | Admitting: Physical Therapy

## 2020-03-27 MED ORDER — BOTOX 100 UNITS IJ SOLR: 600.0000 [IU] | INTRAMUSCULAR | 0 refills | Status: DC

## 2020-03-27 NOTE — Telephone Encounter (Signed)
A denial was received from [facility] for Xeomin.  Per Dr. Terrace Arabia we sent a new request for a PA for Botox 100Units for 5 vials to equal 500units every every 3 months.  Approval received via fax for Botox. The PA ref# is [id number]. Valid from 03/27/2020-06/27/2020

## 2020-03-27 NOTE — Telephone Encounter (Signed)
Will you please send Botox 100 Units X5 vials to [facility]?

## 2020-03-27 NOTE — Telephone Encounter (Addendum)
Per vo by Dr. Terrace Arabia, she would like to order Botox 600 units for this patient. This may need a new PA for the increased amount. I went ahead and sent the prescription for 600 units to [facility].

## 2020-03-27 NOTE — Telephone Encounter (Signed)
I called [facility] back to check to see if a new PA is required for the increase to 600units.   I spoke to Sammy who states it is fine under the current [id number]. She transferred me to [phone] to schedule delivery but they could not do this as patient does not currently have an account with them.  She transferred me to set up the account. I spoke to [place] and provided her with the patients address and phone number.  She then transferred me to the pharmacist, Loraine Leriche, to schedule delivery.  He took a Optometrist as he states he does not have the one that was sent in his system yet.  He asked that I fax a current medication list to [phone].  Med list faxed.  I called patient to let her know they will be getting call from [facility] to go over benefits and give consent to ship the Botox. I spoke to her daughter who verbalized understanding.

## 2020-03-27 NOTE — Addendum Note (Signed)
Addended by: Lindell Spar C on: 03/27/2020 12:06 PM   Modules accepted: Orders

## 2020-03-28 ENCOUNTER — Ambulatory Visit: Payer: Medicare Other | Admitting: Speech Pathology

## 2020-03-28 NOTE — Telephone Encounter (Signed)
Tiffany @ [facility] has called to schedule delivery of pt Botox.  Please call her back at [phone]

## 2020-03-29 ENCOUNTER — Other Ambulatory Visit: Payer: Self-pay

## 2020-03-29 ENCOUNTER — Encounter: Payer: Medicare Other | Attending: Physical Medicine & Rehabilitation | Admitting: Physical Medicine & Rehabilitation

## 2020-03-29 ENCOUNTER — Encounter: Payer: Self-pay | Admitting: Physical Medicine & Rehabilitation

## 2020-03-29 VITALS — BP 119/76 | HR 86 | Ht 61.0 in | Wt 159.0 lb

## 2020-03-29 DIAGNOSIS — G8110 Spastic hemiplegia affecting unspecified side: Secondary | ICD-10-CM | POA: Diagnosis not present

## 2020-03-29 DIAGNOSIS — I69319 Unspecified symptoms and signs involving cognitive functions following cerebral infarction: Secondary | ICD-10-CM | POA: Insufficient documentation

## 2020-03-29 MED ORDER — TIZANIDINE HCL 4 MG PO TABS: 4.0000 mg | ORAL_TABLET | Freq: Every day | ORAL | 1 refills | Status: DC

## 2020-03-29 MED ORDER — METHYLPHENIDATE HCL 5 MG PO TABS: 5.0000 mg | ORAL_TABLET | Freq: Every morning | ORAL | 0 refills | Status: DC

## 2020-03-29 MED FILL — METHYLPHENIDATE 5 MG TABLET: 5 | 30 days supply | Qty: 30 | Fill #0

## 2020-03-29 MED FILL — tiZANidine HCL 4 MG TABS: 4 | 30 days supply | Qty: 30 | Fill #0

## 2020-03-29 NOTE — Patient Instructions (Signed)
Take tizanidine for muscle spasm at night Take methylphenidate to improve alertness in the morning

## 2020-03-29 NOTE — Progress Notes (Addendum)
Subjective:    Patient ID: Christina Evans, female    DOB: [birth year], 59 y.o.   MRN: [id number]  HPI  Reason for referral: Spasticity after stroke  59 year old female who had a right MCA distribution infarct in 2008 while living in [place].  She underwent rehabilitation.  She has subsequently moved to the [place] area.  She was hospitalized in October 2020 for mental status  changes but no new stroke was identified.  She underwent neurologic evaluation first by Dr. Pearlean Brownie and then was referred to Dr. Terrace Arabia 11/30/2019 for botulinum toxin injections. The patient had MRI of the brain on 10/20/2019 which I reviewed demonstrating: Large chronic right MCA infarct.  Patient and daughter indicate that the arm and the leg are giving the patient problems. Was hospitalized for L ACA infarct in Oct 2020 which later evaluations questioned whether this was an acute finding.  L ACA infarct would not explain increased weakness LUE or LLE Last botulinum toxin injection, Xeomin, 01/04/2020 Left pectoralis major 100 units Left platysmas dorsi 100 units Left brachialis 100 units Left pronator teres 25 units Left flexor carpi ulnaris 25 units Left flexor digitorum profundus 25 units Left flexor digitorum superficialis 25 units The patient in March 2021 was referred to outpatient therapy, PT, OT, SLP.  Severe cognitive deficits noted, score of 4/30 on MO CA additional problems include poor concentration noted during therapy.  The patient does sleep well at home.    Pain Inventory Average Pain 8 Pain Right Now 6 My pain is intermittent and sharp  In the last 24 hours, has pain interfered with the following? General activity 6 Relation with others 6 Enjoyment of life 6 What TIME of day is your pain at its worst? n/a Sleep (in general) Fair  Pain is worse with: walking and standing Pain improves with: therapy/exercise Relief from Meds: 2  Mobility walk with assistance use a walker ability to climb  steps?  no do you drive?  no use a wheelchair transfers alone  Function disabled: date disabled . I need assistance with the following:  dressing, bathing, toileting, meal prep, household duties and shopping  Neuro/Psych bladder control problems bowel control problems weakness dizziness confusion anxiety  Prior Studies Any changes since last visit?  no  Physicians involved in your care Any changes since last visit?  no   Family History  Problem Relation Age of Onset  . Hypertension Mother   . Hypertension Father    Social History   Socioeconomic History  . Marital status: Single    Spouse name: Not on file  . Number of children: Not on file  . Years of education: Not on file  . Highest education level: Not on file  Occupational History  . Not on file  Tobacco Use  . Smoking status: Former Games developer  . Smokeless tobacco: Never Used  . Tobacco comment: QUIT IN 2008  Substance and Sexual Activity  . Alcohol use: Yes    Comment: RARE  . Drug use: No  . Sexual activity: Not on file  Other Topics Concern  . Not on file  Social History Narrative   Lives at home with significant other (not married).     Stays in close communication with 2 of her children who live in [place].     She has multiple other children who currently live in [place].     Former smoker, quit in 2008.    Right handed   No caffeine   Social Determinants  of Health   Financial Resource Strain:   . Difficulty of Paying Living Expenses:   Food Insecurity:   . Worried About Charity fundraiser in the Last Year:   . Arboriculturist in the Last Year:   Transportation Needs:   . Film/video editor (Medical):   Marland Kitchen Lack of Transportation (Non-Medical):   Physical Activity:   . Days of Exercise per Week:   . Minutes of Exercise per Session:   Stress:   . Feeling of Stress :   Social Connections:   . Frequency of Communication with Friends and Family:   . Frequency of Social Gatherings  with Friends and Family:   . Attends Religious Services:   . Active Member of Clubs or Organizations:   . Attends Archivist Meetings:   Marland Kitchen Marital Status:    Past Surgical History:  Procedure Laterality Date  . IR ANGIO INTRA EXTRACRAN SEL COM CAROTID INNOMINATE BILAT MOD SED  09/15/2019  . IR ANGIO VERTEBRAL SEL VERTEBRAL UNI L MOD SED  09/15/2019   Past Medical History:  Diagnosis Date  . Asthma   . CVA (cerebral vascular accident) ([place])   . Diabetes mellitus without complication (HCC)    BP 119/76   Pulse 86   Ht 5\' 1"  (1.549 m)   Wt 159 lb (72.1 kg)   SpO2 95%   BMI 30.04 kg/m   Opioid Risk Score:   Fall Risk Score:  `1  Depression screen PHQ 2/9  No flowsheet data found.  Review of Systems  Constitutional: Negative.   HENT: Negative.   Eyes: Negative.   Respiratory: Positive for shortness of breath.   Cardiovascular: Negative.   Gastrointestinal: Positive for diarrhea.  Endocrine: Negative.        High blood sugar  Genitourinary: Positive for difficulty urinating.  Musculoskeletal: Positive for gait problem.  Allergic/Immunologic: Negative.   Neurological: Positive for dizziness and weakness.  Hematological: Negative.   Psychiatric/Behavioral: Positive for confusion. The patient is nervous/anxious.   All other systems reviewed and are negative.      Objective:   Physical Exam Vitals and nursing note reviewed. Exam conducted with a chaperone present.  Constitutional:      Appearance: Normal appearance.  HENT:     Head: Normocephalic and atraumatic.  Eyes:     Extraocular Movements: Extraocular movements intact.     Conjunctiva/sclera: Conjunctivae normal.     Pupils: Pupils are equal, round, and reactive to light.  Cardiovascular:     Rate and Rhythm: Normal rate and regular rhythm.  Pulmonary:     Effort: Pulmonary effort is normal. No respiratory distress.     Breath sounds: Normal breath sounds. No stridor.  Abdominal:     General:  Abdomen is flat. Bowel sounds are normal. There is no distension.     Palpations: Abdomen is soft.  Musculoskeletal:        General: No swelling or tenderness.     Cervical back: Normal range of motion.     Right lower leg: No edema.     Left lower leg: No edema.  Skin:    General: Skin is warm and dry.  Neurological:     Mental Status: She is alert.     Coordination: Coordination abnormal. Finger-Nose-Finger Test abnormal.     Gait: Gait abnormal.     Comments: Patient is able to go from sit to stand with contact-guard assistance she can ambulate with min assist from her daughter  a couple steps she does exhibit left foot inversion as well as toe curling during swing and stance phase.  Psychiatric:        Attention and Perception: Attention normal.        Mood and Affect: Mood is depressed. Affect is blunt.        Speech: Speech is delayed.        Behavior: Behavior is cooperative.     LUE motor 0/5   Tone MAS Left pec 3-4 Left Biceps 3 Left FF 3 Left wrist flex 3   Motor 3- Left HF 3- Left KE 0 at foot/ankle  Tone MAS 2 at ankle PF No clonus at ankle With standing there is FDL and PT activation    Assessment & Plan:  1.  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  Patient has some lethargy and would not recommend using this medication during the day The patient does have multifocal spasticity in the left upper limb and left lower limb particularly affecting left pectoralis biceps wrist flexors finger flexors toe flexors and foot inverters. Would recommend  Dysport Left Pec 200 Biceps 200 FCR 200 FDS 200 FDP 200  PT 300 FDL 200  Also needs Left AFO for spastic hemiparesis causing foot drop.  To provide safety with ambulation, improve gait  2.  Concentration/cognition CVA, trial a.m. dose of methylphenidate, ACA infarct may be causing cognitive deficits.  Will reeval at follow-up  visit and determine effectiveness.  Will determine need for dosage adjustment Certainly patient has cognitive deficits related to her stroke.  She also may have mood disorder but is already on the max dose of Celexa.  If she does not respond well to Ritalin, may consider neuropsych eval

## 2020-04-01 ENCOUNTER — Telehealth: Payer: Self-pay | Admitting: Physical Therapy

## 2020-04-01 ENCOUNTER — Ambulatory Visit: Payer: Medicare Other | Admitting: Occupational Therapy

## 2020-04-01 ENCOUNTER — Other Ambulatory Visit: Payer: Self-pay

## 2020-04-01 ENCOUNTER — Ambulatory Visit: Payer: Medicare Other | Attending: Neurology | Admitting: Physical Therapy

## 2020-04-01 DIAGNOSIS — I69354 Hemiplegia and hemiparesis following cerebral infarction affecting left non-dominant side: Secondary | ICD-10-CM

## 2020-04-01 DIAGNOSIS — R41841 Cognitive communication deficit: Secondary | ICD-10-CM | POA: Diagnosis present

## 2020-04-01 DIAGNOSIS — R2689 Other abnormalities of gait and mobility: Secondary | ICD-10-CM | POA: Diagnosis present

## 2020-04-01 DIAGNOSIS — I69318 Other symptoms and signs involving cognitive functions following cerebral infarction: Secondary | ICD-10-CM | POA: Insufficient documentation

## 2020-04-01 DIAGNOSIS — M79602 Pain in left arm: Secondary | ICD-10-CM | POA: Insufficient documentation

## 2020-04-01 DIAGNOSIS — M21372 Foot drop, left foot: Secondary | ICD-10-CM

## 2020-04-01 DIAGNOSIS — M6281 Muscle weakness (generalized): Secondary | ICD-10-CM | POA: Diagnosis present

## 2020-04-01 DIAGNOSIS — M25622 Stiffness of left elbow, not elsewhere classified: Secondary | ICD-10-CM | POA: Diagnosis present

## 2020-04-01 DIAGNOSIS — R29818 Other symptoms and signs involving the nervous system: Secondary | ICD-10-CM | POA: Diagnosis present

## 2020-04-01 DIAGNOSIS — R2681 Unsteadiness on feet: Secondary | ICD-10-CM

## 2020-04-01 DIAGNOSIS — R293 Abnormal posture: Secondary | ICD-10-CM | POA: Diagnosis present

## 2020-04-01 NOTE — Telephone Encounter (Signed)
Dr. Wynn Banker, Christina Evans has been seen by PT at [facility].  The patient would benefit from an order for a L AFO to assist with foot clearance, spasticity, and to improve gait mechanics for functional ambulation.  If you agree, please place an order in [facility] workque in [facility] or fax the order to [phone]. Thank you, Sherlie Ban, PT, DPT 04/01/20 4:23 PM    [facility] [address] [place], [place]  [address] Phone:  [phone] Fax:  [phone]

## 2020-04-01 NOTE — Therapy (Signed)
[facility] [address] [place], [place], [address] Phone: [phone]   Fax:  [phone]  Physical Therapy Treatment  Patient Details  Name: Christina Evans MRN: [id number] Date of Birth: 1961-05-27 Referring Provider (PT): Micki Riley, MD   Encounter Date: 04/01/2020  PT End of Session - 04/01/20 1619    Visit Number  9    Number of Visits  25    Date for PT Re-Evaluation  05/07/20   written for 60 day POC   Authorization Type  [employer] Medicare    PT Start Time  [phone]    PT Stop Time  0930    PT Time Calculation (min)  44 min    Equipment Utilized During Treatment  Gait belt    Activity Tolerance  Patient tolerated treatment well    Behavior During Therapy  [facility] for tasks assessed/performed   easily distractable at times      Past Medical History:  Diagnosis Date  . Asthma   . CVA (cerebral vascular accident) (HCC)   . Diabetes mellitus without complication [facility])     Past Surgical History:  Procedure Laterality Date  . IR ANGIO INTRA EXTRACRAN SEL COM CAROTID INNOMINATE BILAT MOD SED  09/15/2019  . IR ANGIO VERTEBRAL SEL VERTEBRAL UNI L MOD SED  09/15/2019    There were no vitals filed for this visit.  Subjective Assessment - 04/01/20 0849    Subjective  Saw Dr. Wynn Banker last week - will be getting Botox for her arm next week and legs (in the next 3 weeks). Was also prescribed medication for attention and for spasms she has been having at night. Had an ear infection last week and had to miss both therapies.    Patient is accompained by:  Family member   daughter [name]   Pertinent History  PMH significant for large right MCA CVA, HTN, T2DM, HLD, hx of Left foot fifth metatarsal fracture with osteopenia, asthma.    Limitations  Standing;Walking    How long can you stand comfortably?  1 minute (when daughter is changing her when she is wearing diapers).    How long can you walk comfortably?  a few  feet.    Diagnostic tests  MRI brain with moderate sized acute to early subacute left ACA territory infarct andlarge chronic right MCA territory infarct.    Patient Stated Goals  wants to get better, wants to be independent.    Currently in Pain?  No/denies                       [facility] Adult PT Treatment/Exercise - 04/01/20 2103      Ambulation/Gait   Ambulation/Gait  Yes    Ambulation/Gait Assistance  4: Min guard;4: Min assist    Ambulation/Gait Assistance Details  due to pt with increased spasticity in LUE, unable to gait train with RW with L hand splint. instead trialed AFOs using small base quad cane. Trialed L posterior ottobock AFO during first bout of gait, with pt reporting incr discomfort from where strut was rubbing on her foot. however pt able to demonstrate proper cane sequencing with initial cueing and step through pattern with gait. Trialed 2nd L blue rocker AFO with pt demonstrating improved L foot clearance as well as pt reporting improved comfort, able to ambulate 115' with small base quad cane and step through pattern. Pt with increased hip weakness and demonstrates incr pelvic retraction on L  Ambulation Distance (Feet)  70 Feet   x1, 115' x1   Assistive device  Small based quad cane    Gait Pattern  Step-through pattern;Decreased stance time - left;Decreased hip/knee flexion - left;Decreased weight shift to left;Decreased dorsiflexion - left;Left circumduction;Left hip hike;Narrow base of support;Poor foot clearance - left    Ambulation Surface  Level;Indoor      Neuro Re-ed    Neuro Re-ed Details   With L Ottobock AFO donned - performed sit <> stands from w/c with no UE support to facilitate increased weight shift towards LLE in order to relax LUE for gait with RW and L hand splint. Performed multiple reps with prolonged standing with use of mirror for visual feedback to maintain weight shift towards LLE without UE support on walker. Pt needed frequent  re-direction to the task at hand and for maintaining weight shift to L. Had assist from a 2nd PT to try to assist in getting pt's LUE to relax to put in L hand splint, however pt with increased tone today in standing and unable to relax hand to put in hand splint. Instead performed gait today with quad base cane in RUE.                PT Short Term Goals - 03/19/20 2156      PT SHORT TERM GOAL #1   Title  Pt will undergo further assessment of L AFO for gait, mobility, and transfers. ALL STGS DUE 04/16/20    Baseline  assessed blue rocker AFO on 03/18/20 - unable to further assess other braces due to pt having incr spasticity in LUE and unable to put hand in handsplint for gait with RW    Time  4   due to delay in scheduling   Period  Weeks    Status  On-going    Target Date  04/16/20      PT SHORT TERM GOAL #2   Title  Pt will perform stand step transfers with supervision in order to [place] caregiver burder.    Baseline  stand step with min guard    Time  4    Period  Weeks    Status  On-going      PT SHORT TERM GOAL #3   Title  Pt will tolerate static standing for at least 5 minutes with no UE/single UE support with min guar in order to improve tolerance for ADLs.    Time  4    Period  Weeks    Status  Revised      PT SHORT TERM GOAL #4   Title  Pt will ambulate at least 100' with RW, L AFO and min guard in order to improve functional mobility.    Baseline  50' with RW and L hand splint and blue rocker L AFO    Time  4    Period  Weeks    Status  Revised      PT SHORT TERM GOAL #5   Title  Pt will undergo assessment of gait speed when appropriate.    Time  4    Period  Weeks    Status  New        PT Long Term Goals - 02/07/20 1144      PT LONG TERM GOAL #1   Title  Pt will perform stand step transfers with no AD with mod I in order to decr caregiver burden. ALL LTGS DUE 05/08/20    Time  13    Period  Weeks    Status  New    Target Date  05/08/20      PT LONG  TERM GOAL #2   Title  Pt will tolerate static standing for at least 5 minutes with single UE vs. no UE support in order to improve tolerance for ADLs.    Time  13    Period  Weeks    Status  New      PT LONG TERM GOAL #3   Title  Pt will ambulate at least 200' with hemiwalker vs. LRAD and supervision in order to improve functional houeshold mobility.    Time  13    Period  Weeks    Status  New      PT LONG TERM GOAL #4   Title  Pt will perform at least 5 sit <> stand transfers from mat table and standard height chair with mod I with no UE support vs. single UE support in order to demo improved functional transfers    Time  13    Period  Weeks    Status  New      PT LONG TERM GOAL #5   Title  Pt and pt's daughter will be independent with final HEP for weight bearing/lower extremity stretching/strengthening.    Time  13    Period  Weeks    Status  New            Plan - 04/01/20 2109    Clinical Impression Statement  Focus of today's skilled session was trialing AFOs for foot clearance and to improve gait mechanics. Unable to perform gait with RW and L hand splint due to pt with incr LUE spasticity. Instead performed gait with small base quad cane. Trialed L posterior ottobock AFO and blue rocker AFO. Pt demonstrating improved L foot clearance with blue rocker AFO as well as reporting incr comfort with the blue rocker. PT to send order to MD for AFO referral. Will continue to progress towards LTGs.    Personal Factors and Comorbidities  Comorbidity 3+;Past/Current Experience;Time since onset of injury/illness/exacerbation;Behavior Pattern    Comorbidities  PMH significant for large right MCA CVA, HTN, T2DM, HLD, hx of Left foot fifth metatarsal fracture with osteopenia, asthma.    Examination-Activity Limitations  Stand;Locomotion Level;Transfers;Toileting;Continence;Dressing    Examination-Participation Restrictions  Community Activity    Stability/Clinical Decision Making   Evolving/Moderate complexity    Rehab Potential  Good    PT Frequency  2x / week    PT Duration  12 weeks    PT Treatment/Interventions  ADLs/Self Care Home Management;Aquatic Therapy;Electrical Stimulation;DME Instruction;Gait training;Functional mobility training;Neuromuscular re-education;Balance training;Therapeutic exercise;Therapeutic activities;Patient/family education;Orthotic Fit/Training;Passive range of motion;Energy conservation    PT Next Visit Plan  continue to trial AFOs. gait training with RW,  use of balance master for weight shifting, standing balance/weight shifting. anything in a mini squat position to break up tone/spasticity.    Consulted and Agree with Plan of Care  Patient;Family member/caregiver       Patient will benefit from skilled therapeutic intervention in order to improve the following deficits and impairments:  Abnormal gait, Decreased activity tolerance, Decreased coordination, Decreased balance, Decreased cognition, Decreased endurance, Decreased knowledge of use of DME, Decreased range of motion, Decreased strength, Difficulty walking, Impaired tone, Impaired UE functional use, Postural dysfunction, Impaired sensation  Visit Diagnosis: Unsteadiness on feet  Muscle weakness (generalized)  Other symptoms and signs involving the nervous system  Abnormal posture  Problem List Patient Active Problem List   Diagnosis Date Noted  . Cerebrovascular accident (CVA) due to thrombosis of precerebral artery (HCC) 11/30/2019  . 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 Spastic hemiparesis affecting nondominant side (HCC) 11/30/2019  . Chronic bilateral low back pain with sciatica 11/30/2019  . Metatarsal fracture 09/22/2019  . Depression 09/22/2019  . Asthma 09/22/2019  . Palliative care by specialist   . Goals of care, counseling/discussion   . Encephalopathy   . Acute CVA (cerebrovascular accident)  (HCC) 09/14/2019  . Acute arterial ischemic stroke, multifocal, anterior circulation, right (HCC) 09/14/2019  . Severe comorbid illness   . AMS (altered mental status) 09/13/2019  . 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  . Hyperlipidemia 09/02/2017  . Hypertension 09/02/2017  . Chest pain 09/02/2017    Drake Leach, PT, DPT  04/01/2020, 9:11 PM  [facility] [address] [place], [place], [address] Phone: [phone]   Fax:  [phone]  Name: Christina Evans MRN: [id number] Date of Birth: 01/03/1961

## 2020-04-01 NOTE — Telephone Encounter (Signed)
Patient has a Botox appointment on 04/09/2020.  I called [facility] [phone] to schedule medication delivery.  I spoke to [place].  Delivery was scheduled for 04/03/2020.

## 2020-04-03 ENCOUNTER — Ambulatory Visit: Payer: Medicare Other | Admitting: Physical Therapy

## 2020-04-03 ENCOUNTER — Other Ambulatory Visit: Payer: Self-pay

## 2020-04-03 ENCOUNTER — Ambulatory Visit: Payer: Medicare Other | Admitting: Occupational Therapy

## 2020-04-03 ENCOUNTER — Encounter: Payer: Self-pay | Admitting: Occupational Therapy

## 2020-04-03 DIAGNOSIS — R2681 Unsteadiness on feet: Secondary | ICD-10-CM | POA: Diagnosis not present

## 2020-04-03 DIAGNOSIS — M6281 Muscle weakness (generalized): Secondary | ICD-10-CM

## 2020-04-03 DIAGNOSIS — R29818 Other symptoms and signs involving the nervous system: Secondary | ICD-10-CM

## 2020-04-03 DIAGNOSIS — M25622 Stiffness of left elbow, not elsewhere classified: Secondary | ICD-10-CM

## 2020-04-03 DIAGNOSIS — R293 Abnormal posture: Secondary | ICD-10-CM

## 2020-04-03 DIAGNOSIS — M79602 Pain in left arm: Secondary | ICD-10-CM

## 2020-04-03 DIAGNOSIS — I69354 Hemiplegia and hemiparesis following cerebral infarction affecting left non-dominant side: Secondary | ICD-10-CM

## 2020-04-03 DIAGNOSIS — I69318 Other symptoms and signs involving cognitive functions following cerebral infarction: Secondary | ICD-10-CM

## 2020-04-03 NOTE — Therapy (Signed)
[facility] [address] [place], [place], [address] Phone: [phone]   Fax:  [phone]  Physical Therapy Treatment/10th Visit Progress Note  Patient Details  Name: Christina Evans MRN: [id number] Date of Birth: June 25, 1961 Referring Provider (PT): Garvin Fila, MD   10th Visit Physical Therapy Progress Note  Dates of Reporting Period: 02/07/20 to 04/04/20   Encounter Date: 04/03/2020  PT End of Session - 04/03/20 1222    Visit Number  10    Number of Visits  25    Date for PT Re-Evaluation  05/07/20   written for 60 day POC   Authorization Type  [employer] Medicare    PT Start Time  [phone]    PT Stop Time  0931    PT Time Calculation (min)  44 min    Equipment Utilized During Treatment  Gait belt    Activity Tolerance  Patient tolerated treatment well    Behavior During Therapy  [facility] for tasks assessed/performed   easily distractable at times      Past Medical History:  Diagnosis Date  . Asthma   . CVA (cerebral vascular accident) ([place])   . Diabetes mellitus without complication [facility])     Past Surgical History:  Procedure Laterality Date  . IR ANGIO INTRA EXTRACRAN SEL COM CAROTID INNOMINATE BILAT MOD SED  09/15/2019  . IR ANGIO VERTEBRAL SEL VERTEBRAL UNI L MOD SED  09/15/2019    There were no vitals filed for this visit.  Subjective Assessment - 04/03/20 0850    Subjective  Took the medication for spasms. Has not yet taken Ritalin due to the side effects of sweating.    Patient is accompained by:  Family member   daughter [name]   Pertinent History  PMH significant for large right MCA CVA, HTN, T2DM, HLD, hx of Left foot fifth metatarsal fracture with osteopenia, asthma.    Limitations  Standing;Walking    How long can you stand comfortably?  1 minute (when daughter is changing her when she is wearing diapers).    How long can you walk comfortably?  a few feet.    Diagnostic tests  MRI brain with  moderate sized acute to early subacute left ACA territory infarct andlarge chronic right MCA territory infarct.    Patient Stated Goals  wants to get better, wants to be independent.    Currently in Pain?  No/denies                        [facility] Adult PT Treatment/Exercise - 04/04/20 0001      Transfers   Transfers  Sit to Stand;Stand to Sit    Sit to Stand  5: Supervision;4: Min guard    Sit to Stand Details  Manual facilitation for placement;Manual facilitation for weight shifting;Verbal cues for technique;Verbal cues for sequencing    Stand to Sit  4: Min guard;5: Supervision    Comments  verbal and tactile cues to perform without UE support throughout session for improved use of LLE for transfers, manual and verbal cues to shift weight towards LLE to sit back down to mat table with no UE support      Ambulation/Gait   Ambulation/Gait  Yes    Ambulation/Gait Assistance  4: Min guard;4: Min assist    Ambulation/Gait Assistance Details  ambulated wiht blue rocker AFO today on LLE with pt demonstrating improved LLE foot clearance, pt still with decr hip protraction and hip/knee  flexion. pt able to perform a more consistent step through pattern today. Pt needing max verbal cues to attend to LLE when stepping as pt has tendency to look around room during gait. cues for incr step length with RLE for incr weight shift to L    Ambulation Distance (Feet)  230 Feet    Assistive device  Small based quad cane    Gait Pattern  Step-through pattern;Decreased stance time - left;Decreased hip/knee flexion - left;Decreased weight shift to left;Decreased dorsiflexion - left;Left circumduction;Left hip hike;Narrow base of support;Poor foot clearance - left    Ambulation Surface  Level;Indoor      Therapeutic Activites    Therapeutic Activities  Other Therapeutic Activities    Other Therapeutic Activities  attempted to perform standing weight shifting activities on Balance Master to use  visual cues for weight shifting towards L, however therapist unable to get computer to work for activity, pt needing min A to step onto platform, and then to descend needed mod A x2 to step down to smaller step and then to floor due to fear of descending, took increased time       Neuro Re-ed    Neuro Re-ed Details   standing at edge of mat with L Blue Rocker AFO donned and quad cane, use of mirror as visual cues for weight shifting towards L towards therapist and then taking a step forward and backwards with RLE while maintaining weight shift towrads L. pt apraxic and even with visual cues has difficulty with weight shift, pt also easily distractable and needs frequent reminders to the task at hand               PT Short Term Goals - 03/19/20 2156      PT SHORT TERM GOAL #1   Title  Pt will undergo further assessment of L AFO for gait, mobility, and transfers. ALL STGS DUE 04/16/20    Baseline  assessed blue rocker AFO on 03/18/20 - unable to further assess other braces due to pt having incr spasticity in LUE and unable to put hand in handsplint for gait with RW    Time  4   due to delay in scheduling   Period  Weeks    Status  On-going    Target Date  04/16/20      PT SHORT TERM GOAL #2   Title  Pt will perform stand step transfers with supervision in order to decr caregiver burder.    Baseline  stand step with min guard    Time  4    Period  Weeks    Status  On-going      PT SHORT TERM GOAL #3   Title  Pt will tolerate static standing for at least 5 minutes with no UE/single UE support with min guar in order to improve tolerance for ADLs.    Time  4    Period  Weeks    Status  Revised      PT SHORT TERM GOAL #4   Title  Pt will ambulate at least 100' with RW, L AFO and min guard in order to improve functional mobility.    Baseline  50' with RW and L hand splint and blue rocker L AFO    Time  4    Period  Weeks    Status  Revised      PT SHORT TERM GOAL #5   Title  Pt  will undergo assessment of gait speed when  appropriate.    Time  4    Period  Weeks    Status  New        PT Long Term Goals - 02/07/20 1144      PT LONG TERM GOAL #1   Title  Pt will perform stand step transfers with no AD with mod I in order to decr caregiver burden. ALL LTGS DUE 05/08/20    Time  13    Period  Weeks    Status  New    Target Date  05/08/20      PT LONG TERM GOAL #2   Title  Pt will tolerate static standing for at least 5 minutes with single UE vs. no UE support in order to improve tolerance for ADLs.    Time  13    Period  Weeks    Status  New      PT LONG TERM GOAL #3   Title  Pt will ambulate at least 200' with hemiwalker vs. LRAD and supervision in order to improve functional houeshold mobility.    Time  13    Period  Weeks    Status  New      PT LONG TERM GOAL #4   Title  Pt will perform at least 5 sit <> stand transfers from mat table and standard height chair with mod I with no UE support vs. single UE support in order to demo improved functional transfers    Time  13    Period  Weeks    Status  New      PT LONG TERM GOAL #5   Title  Pt and pt's daughter will be independent with final HEP for weight bearing/lower extremity stretching/strengthening.    Time  13    Period  Weeks    Status  New            Plan - 04/04/20 1203    Clinical Impression Statement  10th visit progress note: Pt demonstrating improved LLE foot clearance with use of L Blue Rocker AFO for gait. Trialed gait again today with small based quad cane with pt needing min guard/min A - as pt with significant incr in LUE spasticity, esp in hand, which limits pt able to weight bear through LUE as use RW with L hand splint. P t will return to physiatry in 3 weeks for botox injections. Pt needing max verbal cues to attend to LLE during gait as pt easily distractable and tends to look around therapy gym. Per pt's daughter, pt has not yet started to take Ritalin. Pt with poor awareness  of her body in space, even with visual cues for weight shifting towards LLE and pt remains fearful of shifting weight to L without UE support. Will continue to progress towards LTGs.    Personal Factors and Comorbidities  Comorbidity 3+;Past/Current Experience;Time since onset of injury/illness/exacerbation;Behavior Pattern    Comorbidities  PMH significant for large right MCA CVA, HTN, T2DM, HLD, hx of Left foot fifth metatarsal fracture with osteopenia, asthma.    Examination-Activity Limitations  Stand;Locomotion Level;Transfers;Toileting;Continence;Dressing    Examination-Participation Restrictions  Community Activity    Stability/Clinical Decision Making  Evolving/Moderate complexity    Rehab Potential  Good    PT Frequency  2x / week    PT Duration  12 weeks    PT Treatment/Interventions  ADLs/Self Care Home Management;Aquatic Therapy;Electrical Stimulation;DME Instruction;Gait training;Functional mobility training;Neuromuscular re-education;Balance training;Therapeutic exercise;Therapeutic activities;Patient/family education;Orthotic Fit/Training;Passive range of motion;Energy conservation  PT Next Visit Plan  continue to trial AFOs. gait training with RW, standing balance/weight shifting. anything in a mini squat position to break up tone/spasticity.    Consulted and Agree with Plan of Care  Patient;Family member/caregiver       Patient will benefit from skilled therapeutic intervention in order to improve the following deficits and impairments:  Abnormal gait, Decreased activity tolerance, Decreased coordination, Decreased balance, Decreased cognition, Decreased endurance, Decreased knowledge of use of DME, Decreased range of motion, Decreased strength, Difficulty walking, Impaired tone, Impaired UE functional use, Postural dysfunction, Impaired sensation  Visit Diagnosis: Unsteadiness on feet  Muscle weakness (generalized)  Other symptoms and signs involving the nervous  system  Abnormal posture     Problem List Patient Active Problem List   Diagnosis Date Noted  . Cerebrovascular accident (CVA) due to thrombosis of precerebral artery (HCC) 11/30/2019  . 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 Spastic hemiparesis affecting nondominant side (HCC) 11/30/2019  . Chronic bilateral low back pain with sciatica 11/30/2019  . Metatarsal fracture 09/22/2019  . Depression 09/22/2019  . Asthma 09/22/2019  . Palliative care by specialist   . Goals of care, counseling/discussion   . Encephalopathy   . Acute CVA (cerebrovascular accident) (HCC) 09/14/2019  . Acute arterial ischemic stroke, multifocal, anterior circulation, right (HCC) 09/14/2019  . Severe comorbid illness   . AMS (altered mental status) 09/13/2019  . 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  . Hyperlipidemia 09/02/2017  . Hypertension 09/02/2017  . Chest pain 09/02/2017    Catalina Gravel, DPT 04/04/2020, 12:03 PM  [facility] [address] [place], [place], [address] Phone: [phone]   Fax:  [phone]  Name: Christina Evans MRN: [id number] Date of Birth: 05-18-1961

## 2020-04-03 NOTE — Telephone Encounter (Signed)
Medication delivered 04/03/20

## 2020-04-03 NOTE — Therapy (Signed)
[facility] [address] [place], [place], [address] Phone: [phone]   Fax:  [phone]  Occupational Therapy Treatment  Patient Details  Name: Christina Evans MRN: [id number] Date of Birth: 1961-01-26 Referring Provider (OT): Dr. Leonie Man   Encounter Date: 04/03/2020  OT End of Session - 04/03/20 1228    Visit Number  9    Number of Visits  24    Date for OT Re-Evaluation  05/21/20    Authorization Type  [employer] medicare and medicaid as secondary. Pt will need PN every 10th visit    Authorization Time Period  90 days - 05/21/2020    Authorization - Visit Number  9    Authorization - Number of Visits  10    OT Start Time  0932    OT Stop Time  9702    OT Time Calculation (min)  43 min    Activity Tolerance  Patient tolerated treatment well       Past Medical History:  Diagnosis Date  . Asthma   . CVA (cerebral vascular accident) ([place])   . Diabetes mellitus without complication [facility])     Past Surgical History:  Procedure Laterality Date  . IR ANGIO INTRA EXTRACRAN SEL COM CAROTID INNOMINATE BILAT MOD SED  09/15/2019  . IR ANGIO VERTEBRAL SEL VERTEBRAL UNI L MOD SED  09/15/2019    There were no vitals filed for this visit.  Subjective Assessment - 04/03/20 0930    Subjective   I can't get to the bathroom fast enough    Patient is accompanied by:  Family member   dtr   Pertinent History  Pt s/p recent R ACA CVA 09/13/19. 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    Currently in Pain?  No/denies                   OT Treatments/Exercises (OP) - 04/03/20 0001      ADLs   Eating  Demonstrated use of rocker knife and then had pt practice and return demonstrate. Provided dtr with info in  writing with how to obtain.  Pt stated she would like one and dtr to order    Toileting  Upon questioning, dtr reports that pt is incontinent of bladder at least 2x/day and of bowel in the morning. Provided written schedule and recommendations for toileting schedule - dtr reports pt "won't get up and go she holds it."  Discussed with pt and dtr need to set an alarm every 2 hours and for dtr to say "time to go" instead of asking pt if she needs to go.  Pt was able to verbalize that she can feel when she has to go but "I can't get to the toilet in time." Dtr reports this is because pt refuses to go for hours. Also recommended no food drink at least 2 hours before bed. Discussed need to eliminate nap as pt is not sleeping through the night and then feels "tired" all day.  Discussed importance of regular schedule of sleeping and waking as well as structured day at home to assist with pt's cognition.  Provided recommendations in writing and pt and dtr verbalized understanding.     ADL Comments  when asked dtr reports pt is still not  wearing resting hand splint at night - dtr reports "she wears for about three hours in the day."  Strongly encouraged pt to build up to 2 - 4 hour blocks and to be wearing at night all night (and not during the day) by next OT session.  Informed pt and dtr that pt's spasticity is worsening and pt's hand is getting tighter so wearing the splint all night is critical. Pt and dtr verbalized understanding.       Neurological Re-education Exercises   Other Exercises 1  Neuro re ed in supine to address active asssited elbow flexion/extension, shoulder ab/adduction, chest presses and overhead reach in closed chain using bilateral hand clasp as well as using PVC square with min facilitation to incorporated grasp. Also utilized place and hold for mid to overhead reach with elbow extension and PVC square for closed chain. Pt needs max cues to attend to R hand to remain on task.               OT Education - 04/03/20 1225    Education Details  toileting schedule, use of rocker knife, necessity to begin wearing splint at night, sleep schedule    Person(s) Educated  Patient;Child(ren)    Methods  Explanation;Demonstration;Handout    Comprehension  Verbalized understanding;Returned demonstration;Verbal cues required       OT Short Term Goals - 04/03/20 1226      OT SHORT TERM GOAL #1   Title  Pt and dtr will be mod  I with HEP to address ROM for RUE - 03/20/2020    Status  Achieved      OT SHORT TERM GOAL #2   Title  Pt and dtr will verbalize understanding for AE for cutting food on plate    Status  Achieved      OT SHORT TERM GOAL #3   Title  Pt will be mod I with washing face and set up for bushing teeth.    Status  Achieved      OT SHORT TERM GOAL #4   Title  Pt will be mod a for UB dressing    Status  Achieved      OT SHORT TERM GOAL #5   Title  Pt will be mod a for LB dressing    Status  Achieved      OT SHORT TERM GOAL #6   Title  Pt will require mod a to hike pants    Status  Achieved      OT SHORT TERM GOAL #7   Title  Pt and dtr will verbalize understanding of toileting schedule to address incontinence    Status  Achieved      OT SHORT TERM GOAL #8   Title  Pt and dtr will be mod with splint wear and care (resting hand splint at night)    Status  On-going        OT Long Term Goals - 03/11/20 0930      OT LONG TERM GOAL #1   Title  Pt and dtr will be mod I with upgraded home activities program to include focus ADL's, transitional movements and balance. - 05/01/2020    Status  On-going      OT LONG TERM GOAL #2   Title  Pt will be supervision for toilet transfers    Status  On-going      OT LONG TERM GOAL #3   Title  Pt will be no more than min a for hiking  pants and toilet hygiene    Status  On-going      OT LONG TERM GOAL #4   Title  Pt will tolerate 110* of L shoulder flexion and 90* abduction during HEP for ease with ADL's  with more than " a little " pain based on faces.    Status  On-going      OT LONG TERM GOAL #5   Title  Pt will be supervision for simple snack prep    Status  On-going      OT LONG TERM GOAL #6   Title  Pt and dtr will complete FOTO outcome scale    Status  On-going            Plan - 04/03/20 1226    Clinical Impression Statement  Pt with slow progress toward goals - pt has met all but one STG.  Pt to return to physiatry in 3 weeks for botox injection    OT Occupational Profile and History  Detailed Assessment- Review of Records and additional review of physical, cognitive, psychosocial history related to current functional performance    Occupational performance deficits (Please refer to evaluation for details):  ADL's;IADL's;Work;Leisure;Social Participation    Body Structure / Function / Physical Skills  ADL;Balance;Continence;Endurance;GMC;IADL;Mobility;Sensation;ROM;Pain;Strength;Tone;UE functional use    Rehab Potential  Good    Clinical Decision Making  Multiple treatment options, significant modification of task necessary    Comorbidities Affecting Occupational Performance:  Presence of comorbidities impacting occupational performance    Comorbidities impacting occupational performance description:  see above    Modification or Assistance to Complete Evaluation   Max significant modification of tasks or assist is necessary to complete    OT Frequency  2x / week    OT Duration  12 weeks    OT Treatment/Interventions  Self-care/ADL training;Aquatic Therapy;Electrical Stimulation;Ultrasound;Moist Heat;Therapeutic exercise;Neuromuscular education;Manual Therapy;Functional Mobility Training;DME and/or AE instruction;Passive range of motion;Patient/family education;Cognitive remediation/compensation;Therapeutic activities;Splinting;Balance training    Plan  check to make sure pt is wearing splint at night,  NMR for LUE/trunk, postural alignment and control, balance and functional  mobility,    Consulted and Agree with Plan of Care  Patient;Family member/caregiver    Family Member Consulted  dtr       Patient will benefit from skilled therapeutic intervention in order to improve the following deficits and impairments:   Body Structure / Function / Physical Skills: ADL, Balance, Continence, Endurance, GMC, IADL, Mobility, Sensation, ROM, Pain, Strength, Tone, UE functional use       Visit Diagnosis: Spastic hemiplegia of left nondominant side as late effect of cerebral infarction (HCC)  Unsteadiness on feet  Muscle weakness (generalized)  Other symptoms and signs involving the nervous system  Abnormal posture  Stiffness of left upper arm joint  Pain in left arm  Other symptoms and signs involving cognitive functions following cerebral infarction    Problem List Patient Active Problem List   Diagnosis Date Noted  . Cerebrovascular accident (CVA) due to thrombosis of precerebral artery ([place]) 11/30/2019  . 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 Spastic hemiparesis affecting nondominant side ([place]) 11/30/2019  . Chronic bilateral low back pain with sciatica 11/30/2019  . Metatarsal fracture 09/22/2019  . Depression 09/22/2019  . Asthma 09/22/2019  . Palliative care by specialist   . Goals of care, counseling/discussion   . Encephalopathy   . Acute CVA (cerebrovascular accident) ([place]) 09/14/2019  . Acute arterial ischemic stroke, multifocal, anterior circulation, right ([place])  09/14/2019  . Severe comorbid illness   . AMS (altered mental status) 09/13/2019  . Controlled diabetes mellitus type 2 with complications ([place]) 25/18/9842  . Hyperlipidemia 09/02/2017  . Hypertension 09/02/2017  . Chest pain 09/02/2017    Quay Burow, OTR/L 04/03/2020, 12:29 PM  [facility] [address] [place], [place], [address] Phone:  [phone]   Fax:  [phone]  Name: Sukhmani Fetherolf MRN: [id number] Date of Birth: 29-Oct-1961

## 2020-04-08 ENCOUNTER — Ambulatory Visit: Payer: Medicare Other | Admitting: Occupational Therapy

## 2020-04-08 ENCOUNTER — Ambulatory Visit: Payer: Medicare Other | Admitting: Physical Therapy

## 2020-04-08 ENCOUNTER — Other Ambulatory Visit: Payer: Self-pay

## 2020-04-08 ENCOUNTER — Encounter: Payer: Self-pay | Admitting: Occupational Therapy

## 2020-04-08 DIAGNOSIS — M6281 Muscle weakness (generalized): Secondary | ICD-10-CM

## 2020-04-08 DIAGNOSIS — R29818 Other symptoms and signs involving the nervous system: Secondary | ICD-10-CM

## 2020-04-08 DIAGNOSIS — R2681 Unsteadiness on feet: Secondary | ICD-10-CM

## 2020-04-08 DIAGNOSIS — R293 Abnormal posture: Secondary | ICD-10-CM

## 2020-04-08 DIAGNOSIS — I69354 Hemiplegia and hemiparesis following cerebral infarction affecting left non-dominant side: Secondary | ICD-10-CM

## 2020-04-08 DIAGNOSIS — M25622 Stiffness of left elbow, not elsewhere classified: Secondary | ICD-10-CM

## 2020-04-08 DIAGNOSIS — I69318 Other symptoms and signs involving cognitive functions following cerebral infarction: Secondary | ICD-10-CM

## 2020-04-08 DIAGNOSIS — M79602 Pain in left arm: Secondary | ICD-10-CM

## 2020-04-08 NOTE — Therapy (Signed)
[facility] [address] [place], [place], [address] Phone: [phone]   Fax:  [phone]  Occupational Therapy Treatment  Patient Details  Name: Christina Evans MRN: [id number] Date of Birth: 1961-02-02 Referring Provider (OT): Dr. Pearlean Brownie   Encounter Date: 04/08/2020  OT End of Session - 04/08/20 1040    Visit Number  10    Number of Visits  24    Date for OT Re-Evaluation  05/21/20    Authorization Type  [employer] medicare and medicaid as secondary. Pt will need PN every 10th visit    Authorization Time Period  90 days - 05/21/2020    Authorization - Visit Number  10    Authorization - Number of Visits  20    OT Start Time  0931    OT Stop Time  1017    OT Time Calculation (min)  46 min    Activity Tolerance  Patient tolerated treatment well       Past Medical History:  Diagnosis Date  . Asthma   . CVA (cerebral vascular accident) (HCC)   . Diabetes mellitus without complication [facility])     Past Surgical History:  Procedure Laterality Date  . IR ANGIO INTRA EXTRACRAN SEL COM CAROTID INNOMINATE BILAT MOD SED  09/15/2019  . IR ANGIO VERTEBRAL SEL VERTEBRAL UNI L MOD SED  09/15/2019    There were no vitals filed for this visit.  Subjective Assessment - 04/08/20 1031    Subjective   I am sleeping better at night since my dtr won't let me take that day time nap    Patient is accompanied by:  Family member   dtr   Pertinent History  Pt s/p recent R ACA CVA 09/13/19. 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    Currently in Pain?  No/denies                   OT Treatments/Exercises (OP) - 04/08/20 0001      Neurological Re-education Exercises   Other Exercises 1  Neuro re ed to address   postural alignment and control in sitting, squatting, standing and functional ambulation.  Pt with signficant deviations with retraction of L hip, rib cage and L shoulder girdle and limited abiity to truly weight shift or bear weight on L side.  Activities in sitting to promote weight bearing on LUE using body on arm movements to increase activation and control of L side of trunk and encourage active elongation in alignment with weight bearing tasks.  Also facilitated during sit to squat and scooting.  In standing focused on decreasing activity of R shoulder girdle and RUE to allow for improved activation of L side - by end of session pt demonstrates improved alignment and control however pt has difficulty with carrying this over from session to session. Pt needs considerable repetition due to cognitive, perceptual and attentional deficits.  Dtr reports pt's attention and basic initiation has improve somewhat since starting on Ritalin.  Addressed functional ambulation and functional transfers using hand held assisted in order to facilitate more activation on L side. Dtr and pt report pt is now wearing splint at night.               OT Short Term Goals -  04/08/20 1038      OT SHORT TERM GOAL #1   Title  Pt and dtr will be mod  I with HEP to address ROM for RUE - 03/20/2020    Status  Achieved      OT SHORT TERM GOAL #2   Title  Pt and dtr will verbalize understanding for AE for cutting food on plate    Status  Achieved      OT SHORT TERM GOAL #3   Title  Pt will be mod I with washing face and set up for bushing teeth.    Status  Achieved      OT SHORT TERM GOAL #4   Title  Pt will be mod a for UB dressing    Status  Achieved      OT SHORT TERM GOAL #5   Title  Pt will be mod a for LB dressing    Status  Achieved      OT SHORT TERM GOAL #6   Title  Pt will require mod a to hike pants    Status  Achieved      OT SHORT TERM GOAL #7   Title  Pt and dtr will verbalize understanding of  toileting schedule to address incontinence    Status  Achieved      OT SHORT TERM GOAL #8   Title  Pt and dtr will be mod with splint wear and care (resting hand splint at night)    Status  Achieved        OT Long Term Goals - 03/11/20 0930      OT LONG TERM GOAL #1   Title  Pt and dtr will be mod I with upgraded home activities program to include focus ADL's, transitional movements and balance. - 05/01/2020    Status  On-going      OT LONG TERM GOAL #2   Title  Pt will be supervision for toilet transfers    Status  On-going      OT LONG TERM GOAL #3   Title  Pt will be no more than min a for hiking pants and toilet hygiene    Status  On-going      OT LONG TERM GOAL #4   Title  Pt will tolerate 110* of L shoulder flexion and 90* abduction during HEP for ease with ADL's with more than " a little " pain based on faces.    Status  On-going      OT LONG TERM GOAL #5   Title  Pt will be supervision for simple snack prep    Status  On-going      OT LONG TERM GOAL #6   Title  Pt and dtr will complete FOTO outcome scale    Status  On-going            Plan - 04/08/20 1039    Clinical Impression Statement  Pt with slowly improving attention and ability to follow facilitation for basic weight shifting, activation of L side.    OT Occupational Profile and History  Detailed Assessment- Review of Records and additional review of physical, cognitive, psychosocial history related to current functional performance    Occupational performance deficits (Please refer to evaluation for details):  ADL's;IADL's;Work;Leisure;Social Participation    Body Structure / Function / Physical Skills  ADL;Balance;Continence;Endurance;GMC;IADL;Mobility;Sensation;ROM;Pain;Strength;Tone;UE functional use    Rehab Potential  Good    Clinical Decision Making  Multiple treatment options, significant modification of task necessary  Comorbidities Affecting Occupational Performance:  Presence of  comorbidities impacting occupational performance    Comorbidities impacting occupational performance description:  see above    Modification or Assistance to Complete Evaluation   Max significant modification of tasks or assist is necessary to complete    OT Frequency  2x / week    OT Duration  12 weeks    OT Treatment/Interventions  Self-care/ADL training;Aquatic Therapy;Electrical Stimulation;Ultrasound;Moist Heat;Therapeutic exercise;Neuromuscular education;Manual Therapy;Functional Mobility Training;DME and/or AE instruction;Passive range of motion;Patient/family education;Cognitive remediation/compensation;Therapeutic activities;Splinting;Balance training    Plan  NMR for LUE/trunk, postural alignment and control, balance and functional mobility,    Consulted and Agree with Plan of Care  Patient;Family member/caregiver    Family Member Consulted  dtr       Patient will benefit from skilled therapeutic intervention in order to improve the following deficits and impairments:   Body Structure / Function / Physical Skills: ADL, Balance, Continence, Endurance, GMC, IADL, Mobility, Sensation, ROM, Pain, Strength, Tone, UE functional use       Visit Diagnosis: Unsteadiness on feet  Muscle weakness (generalized)  Other symptoms and signs involving the nervous system  Abnormal posture  Spastic hemiplegia of left nondominant side as late effect of cerebral infarction (HCC)  Stiffness of left upper arm joint  Pain in left arm  Other symptoms and signs involving cognitive functions following cerebral infarction    Problem List Patient Active Problem List   Diagnosis Date Noted  . Cerebrovascular accident (CVA) due to thrombosis of precerebral artery (HCC) 11/30/2019  . 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 Spastic hemiparesis affecting nondominant side (HCC) 11/30/2019  . Chronic bilateral low back pain with sciatica  11/30/2019  . Metatarsal fracture 09/22/2019  . Depression 09/22/2019  . Asthma 09/22/2019  . Palliative care by specialist   . Goals of care, counseling/discussion   . Encephalopathy   . Acute CVA (cerebrovascular accident) (HCC) 09/14/2019  . Acute arterial ischemic stroke, multifocal, anterior circulation, right (HCC) 09/14/2019  . Severe comorbid illness   . AMS (altered mental status) 09/13/2019  . 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  . Hyperlipidemia 09/02/2017  . Hypertension 09/02/2017  . Chest pain 09/02/2017   Occupational Therapy Progress Note  Dates of Reporting Period: 02/07/2020 to 04/08/2020  Objective Reports of Subjective Statement: see above  Objective Measurements: see above  Goal Update: see above  Plan: see above Reason Skilled Services are Required: see above  Norton Pastel, OTR/L 04/08/2020, 10:42 AM  [facility] [address] [place], [place], [address] Phone: [phone]   Fax:  [phone]  Name: Claris Pech MRN: [id number] Date of Birth: 08-28-1961

## 2020-04-09 ENCOUNTER — Ambulatory Visit: Payer: Medicare Other | Admitting: Neurology

## 2020-04-09 ENCOUNTER — Encounter: Payer: Self-pay | Admitting: Neurology

## 2020-04-09 ENCOUNTER — Telehealth: Payer: Self-pay | Admitting: *Deleted

## 2020-04-09 NOTE — Telephone Encounter (Signed)
No showed Botox appointment. 

## 2020-04-09 NOTE — Therapy (Signed)
[facility] [address] [place], [place], [address] Phone: [phone]   Fax:  [phone]  Physical Therapy Treatment  Patient Details  Name: Christina Evans MRN: [id number] Date of Birth: 09/11/1961 Referring Provider (PT): Micki Riley, MD   Encounter Date: 04/08/2020  PT End of Session - 04/09/20 1411    Visit Number  11    Number of Visits  25    Date for PT Re-Evaluation  05/07/20   written for 60 day POC   Authorization Type  [employer] Medicare    PT Start Time  [phone]    PT Stop Time  [phone]   session ended early due to pt needing to use the restroom   PT Time Calculation (min)  36 min    Equipment Utilized During Treatment  Gait belt    Activity Tolerance  Patient tolerated treatment well    Behavior During Therapy  [facility] for tasks assessed/performed   easily distractable at times      Past Medical History:  Diagnosis Date  . Asthma   . CVA (cerebral vascular accident) (HCC)   . Diabetes mellitus without complication [facility])     Past Surgical History:  Procedure Laterality Date  . IR ANGIO INTRA EXTRACRAN SEL COM CAROTID INNOMINATE BILAT MOD SED  09/15/2019  . IR ANGIO VERTEBRAL SEL VERTEBRAL UNI L MOD SED  09/15/2019    There were no vitals filed for this visit.  Subjective Assessment - 04/08/20 0849    Subjective  Started taking the Ritalin this morning. Has been wearing the hand splint at night, hand is still feeling tight. No falls.    Patient is accompained by:  Family member   daughter [name]   Pertinent History  PMH significant for large right MCA CVA, HTN, T2DM, HLD, hx of Left foot fifth metatarsal fracture with osteopenia, asthma.    Limitations  Standing;Walking    How long can you stand comfortably?  1 minute (when daughter is changing her when she is wearing diapers).    How long can you walk comfortably?  a few feet.    Diagnostic tests  MRI brain with moderate sized acute to early  subacute left ACA territory infarct andlarge chronic right MCA territory infarct.    Patient Stated Goals  wants to get better, wants to be independent.    Currently in Pain?  No/denies                        [facility] Adult PT Treatment/Exercise - 04/09/20 0001      Transfers   Transfers  Sit to Stand;Stand to Sit    Sit to Stand  4: Min assist;4: Min guard    Sit to Stand Details  Manual facilitation for placement;Manual facilitation for weight shifting;Verbal cues for technique;Verbal cues for sequencing    Stand to Sit  4: Min guard;5: Supervision    Stand to Sit Details (indicate cue type and reason)  Verbal cues for sequencing;Verbal cues for technique;Tactile cues for posture;Tactile cues for weight shifting;Manual facilitation for weight shifting    Transfer Cueing  stand step transfer from w/c <> mat table with min guard and cues for weight shift    Comments  performed with black beam between BLE to prevent from ADDucting, first performed forward weight shift and lifting buttocks off of mat table x5 reps, pt needing frequent verbal and tactile cues for forward weight shift and to "look at  flowers on therapist's shirt" due to pt being easily distracted and with tendency to frequently looking around the gym, manual facilitation at trunk to help facilitate forward lean for weight shifting over BLE, performed sit <> stands with no UE support and then standing weight shifting with use of mirror for visual verbal and tactile cues for shifting over to L side, pt with decr body awareness and difficulty sequencing weight shift with pelvis and trunk, manual facilitation during stand > sit to maintain weight through LLE       Exercises   Exercises  Other Exercises    Other Exercises   With RLE extended straight leg bridging for incr weight bearing through LLE 3 x 5 reps, with verbal and tactile cues at L ASIS to first lift into bridge for incr muscle activation. With use of gait belt  around distal thighs isometric R and L hip ABD with 3 second holds, x10 reps therapist resisted L hip flexion/marching.                PT Short Term Goals - 03/19/20 2156      PT SHORT TERM GOAL #1   Title  Pt will undergo further assessment of L AFO for gait, mobility, and transfers. ALL STGS DUE 04/16/20    Baseline  assessed blue rocker AFO on 03/18/20 - unable to further assess other braces due to pt having incr spasticity in LUE and unable to put hand in handsplint for gait with RW    Time  4   due to delay in scheduling   Period  Weeks    Status  On-going    Target Date  04/16/20      PT SHORT TERM GOAL #2   Title  Pt will perform stand step transfers with supervision in order to Ovid caregiver burder.    Baseline  stand step with min guard    Time  4    Period  Weeks    Status  On-going      PT SHORT TERM GOAL #3   Title  Pt will tolerate static standing for at least 5 minutes with no UE/single UE support with min guar in order to improve tolerance for ADLs.    Time  4    Period  Weeks    Status  Revised      PT SHORT TERM GOAL #4   Title  Pt will ambulate at least 100' with RW, L AFO and min guard in order to improve functional mobility.    Baseline  50' with RW and L hand splint and blue rocker L AFO    Time  4    Period  Weeks    Status  Revised      PT SHORT TERM GOAL #5   Title  Pt will undergo assessment of gait speed when appropriate.    Time  4    Period  Weeks    Status  New        PT Long Term Goals - 02/07/20 1144      PT LONG TERM GOAL #1   Title  Pt will perform stand step transfers with no AD with mod I in order to decr caregiver burden. ALL LTGS DUE 05/08/20    Time  13    Period  Weeks    Status  New    Target Date  05/08/20      PT LONG TERM GOAL #2   Title  Pt will  tolerate static standing for at least 5 minutes with single UE vs. no UE support in order to improve tolerance for ADLs.    Time  13    Period  Weeks    Status  New       PT LONG TERM GOAL #3   Title  Pt will ambulate at least 200' with hemiwalker vs. LRAD and supervision in order to improve functional houeshold mobility.    Time  13    Period  Weeks    Status  New      PT LONG TERM GOAL #4   Title  Pt will perform at least 5 sit <> stand transfers from mat table and standard height chair with mod I with no UE support vs. single UE support in order to demo improved functional transfers    Time  13    Period  Weeks    Status  New      PT LONG TERM GOAL #5   Title  Pt and pt's daughter will be independent with final HEP for weight bearing/lower extremity stretching/strengthening.    Time  13    Period  Weeks    Status  New            Plan - 04/09/20 1413    Clinical Impression Statement  Focus of today's skilled session was LLE strengthening/stretching and standing weight shifting/sit <> stand transfes with no UE support. Pt easily distractable (just started taking Ritalin this morning) - needed frequent re-direction to the task at hand, esp during forward weight shift with sit <> stands. With improved reps, pt able to demo improved forward weight shift to stand. Session ended early due to pt verbalizing that she needed to use the restroom. Will continue to progess towards LTGs.    Personal Factors and Comorbidities  Comorbidity 3+;Past/Current Experience;Time since onset of injury/illness/exacerbation;Behavior Pattern    Comorbidities  PMH significant for large right MCA CVA, HTN, T2DM, HLD, hx of Left foot fifth metatarsal fracture with osteopenia, asthma.    Examination-Activity Limitations  Stand;Locomotion Level;Transfers;Toileting;Continence;Dressing    Examination-Participation Restrictions  Community Activity    Stability/Clinical Decision Making  Evolving/Moderate complexity    Rehab Potential  Good    PT Frequency  2x / week    PT Duration  12 weeks    PT Treatment/Interventions  ADLs/Self Care Home Management;Aquatic Therapy;Electrical  Stimulation;DME Instruction;Gait training;Functional mobility training;Neuromuscular re-education;Balance training;Therapeutic exercise;Therapeutic activities;Patient/family education;Orthotic Fit/Training;Passive range of motion;Energy conservation    PT Next Visit Plan  continue to trial AFOs. weight shifting towards L. gait training with RW and hand hold assist, standing balance/weight shifting. anything in a mini squat position to break up tone/spasticity.    Consulted and Agree with Plan of Care  Patient;Family member/caregiver       Patient will benefit from skilled therapeutic intervention in order to improve the following deficits and impairments:  Abnormal gait, Decreased activity tolerance, Decreased coordination, Decreased balance, Decreased cognition, Decreased endurance, Decreased knowledge of use of DME, Decreased range of motion, Decreased strength, Difficulty walking, Impaired tone, Impaired UE functional use, Postural dysfunction, Impaired sensation  Visit Diagnosis: Unsteadiness on feet  Muscle weakness (generalized)  Other symptoms and signs involving the nervous system  Abnormal posture     Problem List Patient Active Problem List   Diagnosis Date Noted  . Cerebrovascular accident (CVA) due to thrombosis of precerebral artery (HCC) 11/30/2019  . 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  Spastic hemiparesis affecting nondominant side (HCC) 11/30/2019  . Chronic bilateral low back pain with sciatica 11/30/2019  . Metatarsal fracture 09/22/2019  . Depression 09/22/2019  . Asthma 09/22/2019  . Palliative care by specialist   . Goals of care, counseling/discussion   . Encephalopathy   . Acute CVA (cerebrovascular accident) (HCC) 09/14/2019  . Acute arterial ischemic stroke, multifocal, anterior circulation, right (HCC) 09/14/2019  . Severe comorbid illness   . AMS (altered mental status) 09/13/2019  . Controlled  diabetes mellitus type 2 with complications (HCC) 09/02/2017  . Hyperlipidemia 09/02/2017  . Hypertension 09/02/2017  . Chest pain 09/02/2017    Drake Leach, PT, DPT  04/09/2020, 2:23 PM  [facility] [address] [place], [place], [address] Phone: [phone]   Fax:  [phone]  Name: Christina Evans MRN: [id number] Date of Birth: 1961/04/30

## 2020-04-10 ENCOUNTER — Ambulatory Visit: Payer: Medicare Other | Admitting: Physical Therapy

## 2020-04-10 ENCOUNTER — Ambulatory Visit: Payer: Medicare Other | Admitting: Occupational Therapy

## 2020-04-10 ENCOUNTER — Other Ambulatory Visit: Payer: Self-pay

## 2020-04-10 ENCOUNTER — Encounter: Payer: Self-pay | Admitting: Occupational Therapy

## 2020-04-10 DIAGNOSIS — I69354 Hemiplegia and hemiparesis following cerebral infarction affecting left non-dominant side: Secondary | ICD-10-CM

## 2020-04-10 DIAGNOSIS — M6281 Muscle weakness (generalized): Secondary | ICD-10-CM

## 2020-04-10 DIAGNOSIS — I69318 Other symptoms and signs involving cognitive functions following cerebral infarction: Secondary | ICD-10-CM

## 2020-04-10 DIAGNOSIS — R293 Abnormal posture: Secondary | ICD-10-CM

## 2020-04-10 DIAGNOSIS — R2689 Other abnormalities of gait and mobility: Secondary | ICD-10-CM

## 2020-04-10 DIAGNOSIS — R29818 Other symptoms and signs involving the nervous system: Secondary | ICD-10-CM

## 2020-04-10 DIAGNOSIS — M79602 Pain in left arm: Secondary | ICD-10-CM

## 2020-04-10 DIAGNOSIS — R2681 Unsteadiness on feet: Secondary | ICD-10-CM

## 2020-04-10 DIAGNOSIS — M25622 Stiffness of left elbow, not elsewhere classified: Secondary | ICD-10-CM

## 2020-04-10 NOTE — Therapy (Addendum)
[facility] [address] [place], [place], [address] Phone: [phone]   Fax:  [phone]  Physical Therapy Treatment  Patient Details  Name: Christina Evans MRN: [id number] Date of Birth: 1961/09/26 Referring Provider (PT): Micki Riley, MD   Encounter Date: 04/10/2020  PT End of Session - 04/10/20 1238    Visit Number  12    Number of Visits  25    Date for PT Re-Evaluation  05/07/20   written for 60 day POC   Authorization Type  U[employer] Medicare    PT Start Time  [phone]    PT Stop Time  0932    PT Time Calculation (min)  42 min    Equipment Utilized During Treatment  Gait belt    Activity Tolerance  Patient tolerated treatment well    Behavior During Therapy  [facility] for tasks assessed/performed   easily distractable at times      Past Medical History:  Diagnosis Date  . Asthma   . CVA (cerebral vascular accident) (HCC)   . Diabetes mellitus without complication [facility])     Past Surgical History:  Procedure Laterality Date  . IR ANGIO INTRA EXTRACRAN SEL COM CAROTID INNOMINATE BILAT MOD SED  09/15/2019  . IR ANGIO VERTEBRAL SEL VERTEBRAL UNI L MOD SED  09/15/2019    There were no vitals filed for this visit.  Subjective Assessment - 04/10/20 0856    Subjective  Still taking the Ritalin - going well.  Was walking at home the other day behind the wheelchair. Wearing the splint at night.    Patient is accompained by:  Family member   daughter Christina Evans   Pertinent History  PMH significant for large right MCA CVA, HTN, T2DM, HLD, hx of Left foot fifth metatarsal fracture with osteopenia, asthma.    Limitations  Standing;Walking    How long can you stand comfortably?  1 minute (when daughter is changing her when she is wearing diapers).    How long can you walk comfortably?  a few feet.    Diagnostic tests  MRI brain with moderate sized acute to early subacute left ACA territory infarct andlarge chronic right  MCA territory infarct.    Patient Stated Goals  wants to get better, wants to be independent.    Currently in Pain?  No/denies                        [facility] Adult PT Treatment/Exercise - 04/11/20 0001      Ambulation/Gait   Ambulation/Gait  Yes    Ambulation/Gait Assistance  4: Min guard;4: Min assist    Ambulation/Gait Assistance Details  Discussed the process of obtaining an AFO with pt and pt's daughter - orthotist to be coming to session next Wednesday to further assess. Also discussed looking into shoes that are wider, as pt's shoes are currently more narrow and the blue rocker AFO is unable to fit properly inside. Performed gait today with SPC and quad base cane to work on safe ambulation for the household. Cues to attend to LLE and verbal/manual cues to weight shift towards L to take a longer step with RLE for incr weight shift. However, pt still demonstrating incr L hip external rotation and incr hip retraction in order to clear LLE and incr R trunk lean. Performed approx. 90' with small base quad cane and then performed an additional 37' with SPC (pt reports she has a single wooden walking  stick at home). Pt demonstrating incr R trunk lean with SPC and noted to have incr LUE tone vs. using small based quad cane. No LOB, min A from therapist for weight shifting towards L     Assistive device  Small based quad cane;Straight cane    Gait Pattern  Step-through pattern;Decreased stance time - left;Decreased hip/knee flexion - left;Decreased weight shift to left;Decreased dorsiflexion - left;Left circumduction;Left hip hike;Narrow base of support;Poor foot clearance - left    Ambulation Surface  Level;Indoor      Neuro Re-ed    Neuro Re-ed Details   At edge of mat table - worked on forward lean towards therapist and then standing without use of UE, multiple reps and then when coming to sit, therapist providing facilitation for weight shifting towards LLE. Seated at edge of mat  table - reaching with RUE outside of BOS over towards L to grab cone, with therapist assisting for incr weight bearing through L side with RLE "floating" off the ground. With BUE support from therapist, shifting over to LLE and then taking step forward with RLE (to tap colorful disc on floor for visual cue) and then back to midline, multiple reps. With RLE on 2" block for incr weight shifting in standing over to LLE, with no UE support, verbal and tactile cues for weight shift, min A. Pt still with decr awareness of body in space and ability to shift both hips and trunk over to L, a couple of instances where pt sat back to mat table, when holding weight shift over to LLE,            PT Education - 04/10/20 1235    Education Details  purchasing a shoe that is wider in order to be able to fit L AFO (pt's current shoes are too narrow) - had pt's daughter take a picture of current trial sneakers used in therapy that have worked well to fit the brace    [employer]) Educated  Patient;Child(ren)    Methods  Explanation;Demonstration    Comprehension  Verbalized understanding       PT Short Term Goals - 03/19/20 2156      PT SHORT TERM GOAL #1   Title  Pt will undergo further assessment of L AFO for gait, mobility, and transfers. ALL STGS DUE 04/16/20    Baseline  assessed blue rocker AFO on 03/18/20 - unable to further assess other braces due to pt having incr spasticity in LUE and unable to put hand in handsplint for gait with RW    Time  4   due to delay in scheduling   Period  Weeks    Status  On-going    Target Date  04/16/20      PT SHORT TERM GOAL #2   Title  Pt will perform stand step transfers with supervision in order to [place] caregiver burder.    Baseline  stand step with min guard    Time  4    Period  Weeks    Status  On-going      PT SHORT TERM GOAL #3   Title  Pt will tolerate static standing for at least 5 minutes with no UE/single UE support with min guar in order to improve  tolerance for ADLs.    Time  4    Period  Weeks    Status  Revised      PT SHORT TERM GOAL #4   Title  Pt will ambulate at least  100' with RW, L AFO and min guard in order to improve functional mobility.    Baseline  50' with RW and L hand splint and blue rocker L AFO    Time  4    Period  Weeks    Status  Revised      PT SHORT TERM GOAL #5   Title  Pt will undergo assessment of gait speed when appropriate.    Time  4    Period  Weeks    Status  New        PT Long Term Goals - 02/07/20 1144      PT LONG TERM GOAL #1   Title  Pt will perform stand step transfers with no AD with mod I in order to decr caregiver burden. ALL LTGS DUE 05/08/20    Time  13    Period  Weeks    Status  New    Target Date  05/08/20      PT LONG TERM GOAL #2   Title  Pt will tolerate static standing for at least 5 minutes with single UE vs. no UE support in order to improve tolerance for ADLs.    Time  13    Period  Weeks    Status  New      PT LONG TERM GOAL #3   Title  Pt will ambulate at least 200' with hemiwalker vs. LRAD and supervision in order to improve functional houeshold mobility.    Time  13    Period  Weeks    Status  New      PT LONG TERM GOAL #4   Title  Pt will perform at least 5 sit <> stand transfers from mat table and standard height chair with mod I with no UE support vs. single UE support in order to demo improved functional transfers    Time  13    Period  Weeks    Status  New      PT LONG TERM GOAL #5   Title  Pt and pt's daughter will be independent with final HEP for weight bearing/lower extremity stretching/strengthening.    Time  13    Period  Weeks    Status  New            Plan - 04/11/20 0959    Clinical Impression Statement  Continued to ambulate with L blue rocker AFO to continue to trial AFOs - [employer]ayer [employer], orthotist, to be present during next Wednesday session to further assess AFO for pt to begin functional ambulation with brace at home. Continued to  trial gait with small base quad cane and SPC for safe, functional household mobility. Pt able to perform with step through pattern, however does have decr postural alignment, with LUE tone noted more with use of SPC for gait. Pt is improving in standing balance without UE support while shifting towards L. Will continue to progress towards LTGs.    Personal Factors and Comorbidities  Comorbidity 3+;Past/Current Experience;Time since onset of injury/illness/exacerbation;Behavior Pattern    Comorbidities  PMH significant for large right MCA CVA, HTN, T2DM, HLD, hx of Left foot fifth metatarsal fracture with osteopenia, asthma.    Examination-Activity Limitations  Stand;Locomotion Level;Transfers;Toileting;Continence;Dressing    Examination-Participation Restrictions  Community Activity    Stability/Clinical Decision Making  Evolving/Moderate complexity    Rehab Potential  Good    PT Frequency  2x / week    PT Duration  12 weeks    PT  Treatment/Interventions  ADLs/Self Care Home Management;Aquatic Therapy;Electrical Stimulation;DME Instruction;Gait training;Functional mobility training;Neuromuscular re-education;Balance training;Therapeutic exercise;Therapeutic activities;Patient/family education;Orthotic Fit/Training;Passive range of motion;Energy conservation    PT Next Visit Plan  orthotist to be here next wednesday for AFO.  weight shifting towards L. gait training with RW (when appropriate with LUE spasticity) and hand hold assist, standing balance/weight shifting. anything in a mini squat position to break up tone/spasticity.    Consulted and Agree with Plan of Care  Patient;Family member/caregiver       Patient will benefit from skilled therapeutic intervention in order to improve the following deficits and impairments:  Abnormal gait, Decreased activity tolerance, Decreased coordination, Decreased balance, Decreased cognition, Decreased endurance, Decreased knowledge of use of DME, Decreased range  of motion, Decreased strength, Difficulty walking, Impaired tone, Impaired UE functional use, Postural dysfunction, Impaired sensation  Visit Diagnosis: Unsteadiness on feet  Muscle weakness (generalized)  Abnormal posture  Other abnormalities of gait and mobility  Other symptoms and signs involving the nervous system     Problem List Patient Active Problem List   Diagnosis Date Noted  . Cerebrovascular accident (CVA) due to thrombosis of precerebral artery (HCC) 11/30/2019  . 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 Spastic hemiparesis affecting nondominant side (HCC) 11/30/2019  . Chronic bilateral low back pain with sciatica 11/30/2019  . Metatarsal fracture 09/22/2019  . Depression 09/22/2019  . Asthma 09/22/2019  . Palliative care by specialist   . Goals of care, counseling/discussion   . Encephalopathy   . Acute CVA (cerebrovascular accident) (HCC) 09/14/2019  . Acute arterial ischemic stroke, multifocal, anterior circulation, right (HCC) 09/14/2019  . Severe comorbid illness   . AMS (altered mental status) 09/13/2019  . 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  . Hyperlipidemia 09/02/2017  . Hypertension 09/02/2017  . Chest pain 09/02/2017    Drake Leach, PT, DPT  04/11/2020, 10:15 AM  [facility] [address] [place], [place], [address] Phone: [phone]   Fax:  [phone]  Name: Malasha Kleppe MRN: [id number] Date of Birth: 1961/08/10

## 2020-04-10 NOTE — Therapy (Signed)
[facility] [address] [place], [place], [address] Phone: [phone]   Fax:  [phone]  Occupational Therapy Treatment  Patient Details  Name: Christina Evans MRN: [id number] Date of Birth: Jun 07, 1961 Referring Provider (OT): Dr. Pearlean Brownie   Encounter Date: 04/10/2020  OT End of Session - 04/10/20 1223    Visit Number  11    Number of Visits  24    Date for OT Re-Evaluation  05/21/20    Authorization Type  UHC medicare and medicaid as secondary. Pt will need PN every 10th visit    Authorization Time Period  90 days - 05/21/2020    Authorization - Visit Number  11    Authorization - Number of Visits  20    OT Start Time  0932    OT Stop Time  1015    OT Time Calculation (min)  43 min    Activity Tolerance  Patient tolerated treatment well       Past Medical History:  Diagnosis Date  . Asthma   . CVA (cerebral vascular accident) (HCC)   . Diabetes mellitus without complication [facility])     Past Surgical History:  Procedure Laterality Date  . IR ANGIO INTRA EXTRACRAN SEL COM CAROTID INNOMINATE BILAT MOD SED  09/15/2019  . IR ANGIO VERTEBRAL SEL VERTEBRAL UNI L MOD SED  09/15/2019    There were no vitals filed for this visit.  Subjective Assessment - 04/10/20 0933    Subjective   I like to  joke    Patient is accompanied by:  Family member   dtr   Pertinent History  Pt s/p recent R ACA CVA 09/13/19. 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    Currently in Pain?  No/denies                   OT Treatments/Exercises (OP) - 04/10/20 0001      ADLs   Functional Mobility  Addressed toilet transfers using loaner AFO and straight cane - pt able to ambulate to bathroom with close supevision.  Pt  does demonstrate signficant postural deviations in trunk, L shoulder girdle and arm as well as LLE.  Pt with retraction of L hip, use of excessive L hip flexion/ER to advance LLE, retraction of L rib cage, depression and adduction of L scapula.  Also addressed clothing mgmt - pt needs max cues to attend to pants on L side and min a to completely pull pants up on left side however was able to maintain balance with supevision      Neurological Re-education Exercises   Other Exercises 1  Neuro re ed in supine to address lower body on upper body rotation and trunk activation as well as upper body on lower body activities with moderate facilitation.  Addresesd rolling L/R with emphasis on activation of trunk flexion/rotation. Also addressed hip extension in supine with bridging. Pt with very poor mobility for L hip extension to allow for more aligned posture in standing and with functional mobility as well as increasing spasticity in LUE in standing and in functiona ambulation. Addresed hip extension in supine with stretch which was then taught to dtr -emphasize importance of stretch daily - followed by pt activating into hip extension in supine with  LLE off edge of bed.  Transitioned into standing and pt demonstrating much improved postural alignment in standing, with lateral weight shifting and with bilateral hand held assist for transfer to wheelchair.              OT Education - 04/10/20 1220    Education Details  hip extension stretch in supine, bed mobility    Person(s) Educated  Patient;Child(ren)    Methods  Explanation;Demonstration;Handout    Comprehension  Verbalized understanding;Returned demonstration       OT Short Term Goals - 04/08/20 1038      OT SHORT TERM GOAL #1   Title  Pt and dtr will be mod  I with HEP to address ROM for RUE - 03/20/2020    Status  Achieved      OT SHORT TERM GOAL #2   Title  Pt and dtr will verbalize understanding for AE for cutting food on plate     Status  Achieved      OT SHORT TERM GOAL #3   Title  Pt will be mod I with washing face and set up for bushing teeth.    Status  Achieved      OT SHORT TERM GOAL #4   Title  Pt will be mod a for UB dressing    Status  Achieved      OT SHORT TERM GOAL #5   Title  Pt will be mod a for LB dressing    Status  Achieved      OT SHORT TERM GOAL #6   Title  Pt will require mod a to hike pants    Status  Achieved      OT SHORT TERM GOAL #7   Title  Pt and dtr will verbalize understanding of toileting schedule to address incontinence    Status  Achieved      OT SHORT TERM GOAL #8   Title  Pt and dtr will be mod with splint wear and care (resting hand splint at night)    Status  Achieved        OT Long Term Goals - 04/10/20 1221      OT LONG TERM GOAL #1   Title  Pt and dtr will be mod I with upgraded home activities program to include focus ADL's, transitional movements and balance. - 05/01/2020    Status  On-going      OT LONG TERM GOAL #2   Title  Pt will be supervision for toilet transfers    Status  Achieved      OT LONG TERM GOAL #3   Title  Pt will be no more than min a for hiking pants and toilet hygiene    Status  On-going      OT LONG TERM GOAL #4   Title  Pt will tolerate 110* of L shoulder flexion and 90* abduction during HEP for ease with ADL's with more than " a little " pain based on faces.    Status  On-going      OT LONG TERM GOAL #5   Title  Pt will be supervision for simple snack prep    Status  On-going      OT LONG TERM GOAL #6   Title  Pt and dtr will complete FOTO outcome scale    Status  On-going            Plan - 04/10/20 1222    Clinical Impression Statement  Pt more animated  today and demonstrating improved attention during session.  Pt more actively participating in therapy    OT Occupational Profile and History  Detailed Assessment- Review of Records and additional review of physical, cognitive, psychosocial history related to current  functional performance    Occupational performance deficits (Please refer to evaluation for details):  ADL's;IADL's;Work;Leisure;Social Participation    Body Structure / Function / Physical Skills  ADL;Balance;Continence;Endurance;GMC;IADL;Mobility;Sensation;ROM;Pain;Strength;Tone;UE functional use    Rehab Potential  Good    Clinical Decision Making  Multiple treatment options, significant modification of task necessary    Comorbidities Affecting Occupational Performance:  Presence of comorbidities impacting occupational performance    Comorbidities impacting occupational performance description:  see above    Modification or Assistance to Complete Evaluation   Max significant modification of tasks or assist is necessary to complete    OT Frequency  2x / week    OT Duration  12 weeks    OT Treatment/Interventions  Self-care/ADL training;Aquatic Therapy;Electrical Stimulation;Ultrasound;Moist Heat;Therapeutic exercise;Neuromuscular education;Manual Therapy;Functional Mobility Training;DME and/or AE instruction;Passive range of motion;Patient/family education;Cognitive remediation/compensation;Therapeutic activities;Splinting;Balance training    Plan  NMR for LUE/trunk, postural alignment and control, balance and functional mobility,    Consulted and Agree with Plan of Care  Patient;Family member/caregiver    Family Member Consulted  dtr       Patient will benefit from skilled therapeutic intervention in order to improve the following deficits and impairments:   Body Structure / Function / Physical Skills: ADL, Balance, Continence, Endurance, GMC, IADL, Mobility, Sensation, ROM, Pain, Strength, Tone, UE functional use       Visit Diagnosis: Unsteadiness on feet  Muscle weakness (generalized)  Other symptoms and signs involving the nervous system  Abnormal posture  Spastic hemiplegia of left nondominant side as late effect of cerebral infarction (HCC)  Stiffness of left upper arm  joint  Pain in left arm  Other symptoms and signs involving cognitive functions following cerebral infarction    Problem List Patient Active Problem List   Diagnosis Date Noted  . Cerebrovascular accident (CVA) due to thrombosis of precerebral artery ([place]) 11/30/2019  . 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 Spastic hemiparesis affecting nondominant side ([place]) 11/30/2019  . Chronic bilateral low back pain with sciatica 11/30/2019  . Metatarsal fracture 09/22/2019  . Depression 09/22/2019  . Asthma 09/22/2019  . Palliative care by specialist   . Goals of care, counseling/discussion   . Encephalopathy   . Acute CVA (cerebrovascular accident) ([place]) 09/14/2019  . Acute arterial ischemic stroke, multifocal, anterior circulation, right ([place]) 09/14/2019  . Severe comorbid illness   . AMS (altered mental status) 09/13/2019  . Controlled diabetes mellitus type 2 with complications ([place]) 21/19/4174  . Hyperlipidemia 09/02/2017  . Hypertension 09/02/2017  . Chest pain 09/02/2017    Quay Burow, OTR/L 04/10/2020, 12:25 PM  [facility] [address] [place], [place], [address] Phone: [phone]   Fax:  [phone]  Name: Rheya Minogue MRN: [id number] Date of Birth: [birth year]

## 2020-04-15 ENCOUNTER — Other Ambulatory Visit: Payer: Self-pay

## 2020-04-15 ENCOUNTER — Ambulatory Visit: Payer: Medicare Other | Admitting: Physical Therapy

## 2020-04-15 ENCOUNTER — Encounter: Payer: Self-pay | Admitting: Physical Therapy

## 2020-04-15 ENCOUNTER — Encounter: Payer: Self-pay | Admitting: Occupational Therapy

## 2020-04-15 ENCOUNTER — Ambulatory Visit: Payer: Medicare Other | Admitting: Occupational Therapy

## 2020-04-15 DIAGNOSIS — I69354 Hemiplegia and hemiparesis following cerebral infarction affecting left non-dominant side: Secondary | ICD-10-CM

## 2020-04-15 DIAGNOSIS — R2681 Unsteadiness on feet: Secondary | ICD-10-CM

## 2020-04-15 DIAGNOSIS — R29818 Other symptoms and signs involving the nervous system: Secondary | ICD-10-CM

## 2020-04-15 DIAGNOSIS — R293 Abnormal posture: Secondary | ICD-10-CM

## 2020-04-15 DIAGNOSIS — M6281 Muscle weakness (generalized): Secondary | ICD-10-CM

## 2020-04-15 DIAGNOSIS — M25622 Stiffness of left elbow, not elsewhere classified: Secondary | ICD-10-CM

## 2020-04-15 DIAGNOSIS — I69318 Other symptoms and signs involving cognitive functions following cerebral infarction: Secondary | ICD-10-CM

## 2020-04-15 DIAGNOSIS — R2689 Other abnormalities of gait and mobility: Secondary | ICD-10-CM

## 2020-04-15 DIAGNOSIS — M79602 Pain in left arm: Secondary | ICD-10-CM

## 2020-04-15 NOTE — Therapy (Addendum)
[facility] [address] [place], [place], [address] Phone: [phone]   Fax:  [phone]  Physical Therapy Treatment  Patient Details  Name: Christina Evans MRN: [id number] Date of Birth: [birth year] Referring Provider (PT): Micki Riley, MD   Encounter Date: 04/15/2020  PT End of Session - 04/15/20 1214    Visit Number  13    Number of Visits  25    Date for PT Re-Evaluation  05/07/20   written for 60 day POC   Authorization Type  [employer] Medicare    PT Start Time  0848    PT Stop Time  0930    PT Time Calculation (min)  42 min    Equipment Utilized During Treatment  Gait belt    Activity Tolerance  Patient tolerated treatment well    Behavior During Therapy  [facility] for tasks assessed/performed   easily distractable at times      Past Medical History:  Diagnosis Date  . Asthma   . CVA (cerebral vascular accident) (HCC)   . Diabetes mellitus without complication [facility])     Past Surgical History:  Procedure Laterality Date  . IR ANGIO INTRA EXTRACRAN SEL COM CAROTID INNOMINATE BILAT MOD SED  09/15/2019  . IR ANGIO VERTEBRAL SEL VERTEBRAL UNI L MOD SED  09/15/2019    There were no vitals filed for this visit.  Subjective Assessment - 04/15/20 0852    Subjective  No changes, nothing new.    Patient is accompained by:  Family member   daughter [name]   Pertinent History  PMH significant for large right MCA CVA, HTN, T2DM, HLD, hx of Left foot fifth metatarsal fracture with osteopenia, asthma.    Limitations  Standing;Walking    How long can you stand comfortably?  1 minute (when daughter is changing her when she is wearing diapers).    How long can you walk comfortably?  a few feet.    Diagnostic tests  MRI brain with moderate sized acute to early subacute left ACA territory infarct andlarge chronic right MCA territory infarct.    Patient Stated Goals  wants to get better, wants to be independent.     Currently in Pain?  No/denies                   04/15/20 0001  Transfers  Transfers Sit to Stand;Stand to Sit  Sit to Stand 4: Min assist;4: Min guard  Sit to Stand Details Manual facilitation for placement;Manual facilitation for weight shifting;Verbal cues for technique;Verbal cues for sequencing;Tactile cues for weight shifting;Tactile cues for sequencing  Sit to Stand Details (indicate cue type and reason) manual and demo cues for posture/forward weight shift prior to standing with therapist facilitating at pt's lower trunk, pt's RUE placed on therapist's shoulder  Stand to Sit 4: Min guard;4: Min assist  Stand to Sit Details (indicate cue type and reason) Verbal cues for sequencing;Verbal cues for technique;Tactile cues for posture;Tactile cues for weight shifting;Manual facilitation for weight shifting  Stand to Sit Details manual facilitation for weight shifting towards L and maintaing weight shift while sitting    Ambulation/Gait  Ambulation/Gait Yes  Ambulation/Gait Assistance 4: Min guard;4: Min assist  Ambulation/Gait Assistance Details Ambulated 66' with straight cane with manual facilitation for weight shift to R and cues for incr R step length. Also performed gait with HHA from therapist standing anteriorly with cues for pt to step towards therapist's foot for incr weight shift to LLE,  pt demonstrating decr step length with RLE and decr stance time on LLE   Ambulation Distance (Feet) 70 Feet (x1, 50' x 1)  Assistive device Straight cane;None  Gait Pattern Step-through pattern;Decreased stance time - left;Decreased hip/knee flexion - left;Decreased weight shift to left;Decreased dorsiflexion - left;Left circumduction;Left hip hike;Narrow base of support;Poor foot clearance - left  Ambulation Surface Level;Indoor  Neuro Re-ed   Neuro Re-ed Details  Standing at edge of mat with 2" block under RLE for incr weight shifting towards LLE, standing weight shift with tactile and  verbal cues through pelvis and trunk, with min/mod A x10 reps mini squats and back up to standing with cues for incr extensor activation upon returning to standing with weight shifting towards LLE  Exercises  Other Exercises  Supine on mat table: performed L hip flexor stretch off mat table 4 x 1 minute reps, with pt demonstrating decr spasticity with last rep and able to perform with incr L knee flexion with assist of therapist. Performed PROM of hip flexion with 15 seconds holds x10 reps. With RLE extended, performed single leg bridging with LLE x10 reps with therapist assisting in maintaining proper positioning and for incr L hip extension. With LLE off of mat table with L knee bent, cues to push L foot down into therapist's hand for incr L hip extensor activation x10 reps        PT Short Term Goals - 03/19/20 2156      PT SHORT TERM GOAL #1   Title  Pt will undergo further assessment of L AFO for gait, mobility, and transfers. ALL STGS DUE 04/16/20    Baseline  assessed blue rocker AFO on 03/18/20 - unable to further assess other braces due to pt having incr spasticity in LUE and unable to put hand in handsplint for gait with RW    Time  4   due to delay in scheduling   Period  Weeks    Status  On-going    Target Date  04/16/20      PT SHORT TERM GOAL #2   Title  Pt will perform stand step transfers with supervision in order to Kimberly caregiver burder.    Baseline  stand step with min guard    Time  4    Period  Weeks    Status  On-going      PT SHORT TERM GOAL #3   Title  Pt will tolerate static standing for at least 5 minutes with no UE/single UE support with min guar in order to improve tolerance for ADLs.    Time  4    Period  Weeks    Status  Revised      PT SHORT TERM GOAL #4   Title  Pt will ambulate at least 100' with RW, L AFO and min guard in order to improve functional mobility.    Baseline  50' with RW and L hand splint and blue rocker L AFO    Time  4    Period   Weeks    Status  Revised      PT SHORT TERM GOAL #5   Title  Pt will undergo assessment of gait speed when appropriate.    Time  4    Period  Weeks    Status  New        PT Long Term Goals - 02/07/20 1144      PT LONG TERM GOAL #1   Title  Pt will  perform stand step transfers with no AD with mod I in order to decr caregiver burden. ALL LTGS DUE 05/08/20    Time  13    Period  Weeks    Status  New    Target Date  05/08/20      PT LONG TERM GOAL #2   Title  Pt will tolerate static standing for at least 5 minutes with single UE vs. no UE support in order to improve tolerance for ADLs.    Time  13    Period  Weeks    Status  New      PT LONG TERM GOAL #3   Title  Pt will ambulate at least 200' with hemiwalker vs. LRAD and supervision in order to improve functional houeshold mobility.    Time  13    Period  Weeks    Status  New      PT LONG TERM GOAL #4   Title  Pt will perform at least 5 sit <> stand transfers from mat table and standard height chair with mod I with no UE support vs. single UE support in order to demo improved functional transfers    Time  13    Period  Weeks    Status  New      PT LONG TERM GOAL #5   Title  Pt and pt's daughter will be independent with final HEP for weight bearing/lower extremity stretching/strengthening.    Time  13    Period  Weeks    Status  New           04/16/20 5009  Plan  Clinical Impression Statement Today's skilled session continued to focus on LLE strengthening and standing weight shifting towards LLE. Pt able to ambulate with step through pattern with SPC but with signficant postural deviations and R trunk lean. Ambulated with B HHA from therapist with pt utilizing more of a step to pattern due to decr weight shift to LLE. Orthotist to be present for next session to assess AFO. Will continue to progress towards LTGs.  Personal Factors and Comorbidities Comorbidity 3+;Past/Current Experience;Time since onset of  injury/illness/exacerbation;Behavior Pattern  Comorbidities PMH significant for large right MCA CVA, HTN, T2DM, HLD, hx of Left foot fifth metatarsal fracture with osteopenia, asthma.  Examination-Activity Limitations Stand;Locomotion Level;Transfers;Toileting;Continence;Dressing  Examination-Participation Restrictions Community Activity  Pt will benefit from skilled therapeutic intervention in order to improve on the following deficits Abnormal gait;Decreased activity tolerance;Decreased coordination;Decreased balance;Decreased cognition;Decreased endurance;Decreased knowledge of use of DME;Decreased range of motion;Decreased strength;Difficulty walking;Impaired tone;Impaired UE functional use;Postural dysfunction;Impaired sensation  Stability/Clinical Decision Making Evolving/Moderate complexity  Rehab Potential Good  PT Frequency 2x / week  PT Duration 12 weeks  PT Treatment/Interventions ADLs/Self Care Home Management;Aquatic Therapy;Electrical Stimulation;DME Instruction;Gait training;Functional mobility training;Neuromuscular re-education;Balance training;Therapeutic exercise;Therapeutic activities;Patient/family education;Orthotic Fit/Training;Passive range of motion;Energy conservation  PT Next Visit Plan orthotist to be here next wednesday for AFO.  tall kneeling? review exercises/stretches with daughter and update HEP as appropriate.  weight shifting towards L. gait training with RW (when appropriate with LUE spasticity) and hand hold assist, standing balance/weight shifting. anything in a mini squat position to break up tone/spasticity.  Consulted and Agree with Plan of Care Patient;Family member/caregiver        Patient will benefit from skilled therapeutic intervention in order to improve the following deficits and impairments:     Visit Diagnosis: Unsteadiness on feet  Muscle weakness (generalized)  Abnormal posture  Other abnormalities of gait and mobility  Other  symptoms  and signs involving the nervous system     Problem List Patient Active Problem List   Diagnosis Date Noted  . Cerebrovascular accident (CVA) due to thrombosis of precerebral artery (HCC) 11/30/2019  . 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 Spastic hemiparesis affecting nondominant side (HCC) 11/30/2019  . Chronic bilateral low back pain with sciatica 11/30/2019  . Metatarsal fracture 09/22/2019  . Depression 09/22/2019  . Asthma 09/22/2019  . Palliative care by specialist   . Goals of care, counseling/discussion   . Encephalopathy   . Acute CVA (cerebrovascular accident) (HCC) 09/14/2019  . Acute arterial ischemic stroke, multifocal, anterior circulation, right (HCC) 09/14/2019  . Severe comorbid illness   . AMS (altered mental status) 09/13/2019  . 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  . Hyperlipidemia 09/02/2017  . Hypertension 09/02/2017  . Chest pain 09/02/2017    Drake Leach, PT, DPT 04/15/2020, 12:24 PM  [facility] [address] [place], [place], [address] Phone: [phone]   Fax:  [phone]  Name: Christina Evans MRN: [id number] Date of Birth: 01/25/1961

## 2020-04-15 NOTE — Therapy (Signed)
[facility] [address] [place], [place], [address] Phone: [phone]   Fax:  [phone]  Occupational Therapy Treatment  Patient Details  Name: Christina Evans MRN: [id number] Date of Birth: [birth year] Referring Provider (OT): Dr. Pearlean Brownie   Encounter Date: 04/15/2020  OT End of Session - 04/15/20 1218    Visit Number  12    Number of Visits  24    Date for OT Re-Evaluation  05/21/20    Authorization Type  [employer] medicare and medicaid as secondary. Pt will need PN every 10th visit    Authorization Time Period  90 days - 05/21/2020    Authorization - Visit Number  12    OT Start Time  0931    OT Stop Time  1015    OT Time Calculation (min)  44 min       Past Medical History:  Diagnosis Date  . Asthma   . CVA (cerebral vascular accident) (HCC)   . Diabetes mellitus without complication [facility])     Past Surgical History:  Procedure Laterality Date  . IR ANGIO INTRA EXTRACRAN SEL COM CAROTID INNOMINATE BILAT MOD SED  09/15/2019  . IR ANGIO VERTEBRAL SEL VERTEBRAL UNI L MOD SED  09/15/2019    There were no vitals filed for this visit.  Subjective Assessment - 04/15/20 0940    Subjective   I am sleepy today (pt reports she is still taking naps during the day- addressed this again suggesting pt stop naps so she can sleep well at night)    Patient is accompanied by:  Family member   dtr dropped pt off and will pick up   Pertinent History  Pt s/p recent R ACA CVA 09/13/19. 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    Currently in Pain?  No/denies                   OT Treatments/Exercises (OP) - 04/15/20 0001      Neurological Re-education Exercises   Other Exercises 1  Neuro re ed initialy in  supine as well as sidelying using moveable surface to address scapular mobility, low to mid reach patterns, elbow flexion and extension, bilateral  closed chain overhead reach. Pt needs min -mod facilitation for all movements as well as max cues for grading and intiation.  Addresed core activation in supine using half sit ups to the right as well as bridging. Transitioned into sidepropping on L elbow with body on arm to reduce spasticity and further mobilize scapula while also engaging LUE into active weight bearing. Transtioned into standing and addressed stance with LLE with active trunk while stepping forward and backward with RLE.  Progressed to functional ambulation using BUE support and facilitation diagonally from R shoulder down into L hip and LLE for active weight shifting and improved postural alignment. Pt continues to need mod to max cues for attention.                 OT Short Term Goals - 04/08/20 1038      OT SHORT TERM GOAL #1   Title  Pt and dtr will be mod  I with HEP to address ROM for RUE - 03/20/2020    Status  Achieved  OT SHORT TERM GOAL #2   Title  Pt and dtr will verbalize understanding for AE for cutting food on plate    Status  Achieved      OT SHORT TERM GOAL #3   Title  Pt will be mod I with washing face and set up for bushing teeth.    Status  Achieved      OT SHORT TERM GOAL #4   Title  Pt will be mod a for UB dressing    Status  Achieved      OT SHORT TERM GOAL #5   Title  Pt will be mod a for LB dressing    Status  Achieved      OT SHORT TERM GOAL #6   Title  Pt will require mod a to hike pants    Status  Achieved      OT SHORT TERM GOAL #7   Title  Pt and dtr will verbalize understanding of toileting schedule to address incontinence    Status  Achieved      OT SHORT TERM GOAL #8   Title  Pt and dtr will be mod with splint wear and care (resting hand splint at night)    Status  Achieved        OT Long Term Goals - 04/15/20 1216       OT LONG TERM GOAL #1   Title  Pt and dtr will be mod I with upgraded home activities program to include focus ADL's, transitional movements and balance. - 05/01/2020    Status  On-going      OT LONG TERM GOAL #2   Title  Pt will be supervision for toilet transfers    Status  Achieved      OT LONG TERM GOAL #3   Title  Pt will be no more than min a for hiking pants and toilet hygiene    Status  On-going      OT LONG TERM GOAL #4   Title  Pt will tolerate 110* of L shoulder flexion and 90* abduction during HEP for ease with ADL's with more than " a little " pain based on faces.    Status  Achieved      OT LONG TERM GOAL #5   Title  Pt will be supervision for simple snack prep    Status  On-going      OT LONG TERM GOAL #6   Title  Pt and dtr will complete FOTO outcome scale    Status  On-going            Plan - 04/15/20 1217    Clinical Impression Statement  Pt with slow progress toward goals. Pt tolerating AAROM to LUE wtih less pain    OT Occupational Profile and History  Detailed Assessment- Review of Records and additional review of physical, cognitive, psychosocial history related to current functional performance    Occupational performance deficits (Please refer to evaluation for details):  ADL's;IADL's;Work;Leisure;Social Participation    Body Structure / Function / Physical Skills  ADL;Balance;Continence;Endurance;GMC;IADL;Mobility;Sensation;ROM;Pain;Strength;Tone;UE functional use    Rehab Potential  Good    Clinical Decision Making  Multiple treatment options, significant modification of task necessary    Comorbidities Affecting Occupational Performance:  Presence of comorbidities impacting occupational performance    Comorbidities impacting occupational performance description:  see above    Modification or Assistance to Complete Evaluation   Max significant modification of tasks or assist is necessary to complete  OT Frequency  2x / week    OT Duration  12 weeks     OT Treatment/Interventions  Self-care/ADL training;Aquatic Therapy;Electrical Stimulation;Ultrasound;Moist Heat;Therapeutic exercise;Neuromuscular education;Manual Therapy;Functional Mobility Training;DME and/or AE instruction;Passive range of motion;Patient/family education;Cognitive remediation/compensation;Therapeutic activities;Splinting;Balance training    Plan  NMR for LUE/trunk, postural alignment and control, balance and functional mobility,    Consulted and Agree with Plan of Care  Patient;Family member/caregiver    Family Member Consulted  dtr       Patient will benefit from skilled therapeutic intervention in order to improve the following deficits and impairments:   Body Structure / Function / Physical Skills: ADL, Balance, Continence, Endurance, GMC, IADL, Mobility, Sensation, ROM, Pain, Strength, Tone, UE functional use       Visit Diagnosis: Unsteadiness on feet  Muscle weakness (generalized)  Abnormal posture  Other symptoms and signs involving the nervous system  Spastic hemiplegia of left nondominant side as late effect of cerebral infarction (HCC)  Stiffness of left upper arm joint  Pain in left arm  Other symptoms and signs involving cognitive functions following cerebral infarction    Problem List Patient Active Problem List   Diagnosis Date Noted  . Cerebrovascular accident (CVA) due to thrombosis of precerebral artery (HCC) 11/30/2019  . 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 Spastic hemiparesis affecting nondominant side (HCC) 11/30/2019  . Chronic bilateral low back pain with sciatica 11/30/2019  . Metatarsal fracture 09/22/2019  . Depression 09/22/2019  . Asthma 09/22/2019  . Palliative care by specialist   . Goals of care, counseling/discussion   . Encephalopathy   . Acute CVA (cerebrovascular accident) (HCC) 09/14/2019  . Acute arterial ischemic stroke, multifocal, anterior circulation,  right (HCC) 09/14/2019  . Severe comorbid illness   . AMS (altered mental status) 09/13/2019  . 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  . Hyperlipidemia 09/02/2017  . Hypertension 09/02/2017  . Chest pain 09/02/2017    Norton Pastel, OTR/L 04/15/2020, 12:20 PM  [facility] [address] [place], [place], [address] Phone: [phone]   Fax:  [phone]  Name: Christina Evans MRN: [id number] Date of Birth: Dec 24, 1960

## 2020-04-16 ENCOUNTER — Ambulatory Visit: Payer: Medicare Other | Admitting: Speech Pathology

## 2020-04-16 DIAGNOSIS — R2681 Unsteadiness on feet: Secondary | ICD-10-CM | POA: Diagnosis not present

## 2020-04-16 DIAGNOSIS — R41841 Cognitive communication deficit: Secondary | ICD-10-CM

## 2020-04-16 MED FILL — ACCU-CHEK GUIDE STRP: 30 days supply | Qty: 100 | Fill #6

## 2020-04-16 NOTE — Therapy (Signed)
[facility] [address] [place], [place], [address] Phone: [phone]   Fax:  [phone]  Speech Language Pathology Treatment  Patient Details  Name: Christina Evans MRN: [id number] Date of Birth: [birth year] Referring Provider (SLP): Delia Heady, MD   Encounter Date: 04/16/2020  End of Session - 04/16/20 1131    Visit Number  1    Number of Visits  17    Date for SLP Re-Evaluation  06/19/20   90 days   SLP Start Time  1017    SLP Stop Time   1100    SLP Time Calculation (min)  43 min    Activity Tolerance  Patient tolerated treatment well       Past Medical History:  Diagnosis Date  . Asthma   . CVA (cerebral vascular accident) (HCC)   . Diabetes mellitus without complication [facility])     Past Surgical History:  Procedure Laterality Date  . IR ANGIO INTRA EXTRACRAN SEL COM CAROTID INNOMINATE BILAT MOD SED  09/15/2019  . IR ANGIO VERTEBRAL SEL VERTEBRAL UNI L MOD SED  09/15/2019    There were no vitals filed for this visit.  Subjective Assessment - 04/16/20 1120    Subjective  Lost train of thought mid-sentence: "Is that the car out there?" looking out window.    Patient is accompained by:  Family member   Christina Evans, daughter   Currently in Pain?  No/denies            ADULT SLP TREATMENT - 04/16/20 1121      General Information   Behavior/Cognition  Distractible;Requires cueing;Decreased sustained attention      Treatment Provided   Treatment provided  Cognitive-Linquistic      Pain Assessment   Pain Assessment  No/denies pain      Cognitive-Linquistic Treatment   Treatment focused on  Cognition;Patient/family/caregiver education    Skilled Treatment  Pt often with off-topic/incorrect responses due to decreased sustained attention when SLP asked her about personal interests. As pt enjoyed cooking prior to CVA, SLP asked daughter for a familiar recipe to patient (omelet). SLP asked pt to  list ingredients she would need to make an omelet, pt was only able to come up with salt, pepper, and butter. Usual mod cues from SLP, daughter to list other ingredients, including eggs. Able to verbally sequence 2 steps to making the omelet but required max A for remainder of steps. Written cues not effective as pt did not read prior to CVA. Worked with pt, daughter to ID functional tasks to practice attention for short periods. Set up [employer] account for pt to access from home computer with visual attention/immediate memory tasks. Pt accuracy for recall of 1 picture 5 seconds later was 80%. Required usual cues to use strategy (verbalizing object) to assist with attention/recall. For 2 pictures, accuracy was 20%.      Assessment / Recommendations / Plan   Plan  Continue with current plan of care      Progression Toward Goals   Progression toward goals  Progressing toward goals       SLP Education - 04/16/20 1131    Education Details  activities for attention    Person(s) Educated  Patient;Child(ren)    Methods  Explanation;Handout    Comprehension  Verbalized understanding;Need further instruction       SLP Short Term Goals - 04/16/20 1132      SLP SHORT TERM GOAL #1   Title  pt will  demonstrate attention appropriate to benefit from continued speech therapy over 4 sessions    Time  4    Period  Weeks   or 9 visits, for all STGs   Status  On-going      SLP SHORT TERM GOAL #2   Title  pt will demo selective attention to simple linguistic task for 90 seconds with occasionl min A x 3 sessions    Time  4    Period  Weeks    Status  On-going       SLP Long Term Goals - 04/16/20 1132      SLP LONG TERM GOAL #1   Title  pt will demo sustained/selective attention for a simple 5 minute linguistic task with rare min A over 3 sessions    Time  8    Period  Weeks   or 17 total sessions, for all LTGS   Status  On-going      SLP LONG TERM GOAL #2   Title  pt will demonstrate  attention appropriate for continued ST over 8 sessions    Time  8    Period  Weeks    Status  On-going       Plan - 04/16/20 1132    Clinical Impression Statement  Pt presents with severe cognitive linguistic deficits stemming primarily from significant attentional deficits - pt scored 4/30 on the [facility] Cognitive Assessment. She will do well to do tasks at home which are familiar and functional that she could perform for aprox 60 seconds. SLP told daughter that pt may need to be redirected to task. Pt would benefit from skilled ST targeting attention. Pt may well benefit from attention med if not already prescribed. If an attention med would be initiated, re-evaluation using another cognitive insttrument may be necessary, and goals modified as needed.    Speech Therapy Frequency  2x / week    Duration  --   8 weeks, or 17 total sessions   Treatment/Interventions  Cognitive reorganization;Internal/external aids;Patient/family education;Compensatory strategies;SLP instruction and feedback;Cueing hierarchy;Functional tasks;Environmental controls;Multimodal communcation approach    Potential to Achieve Goals  Fair    Potential Considerations  Severity of impairments;Cooperation/participation level;Ability to learn/carryover information       Patient will benefit from skilled therapeutic intervention in order to improve the following deficits and impairments:   Cognitive communication deficit    Problem List Patient Active Problem List   Diagnosis Date Noted  . Cerebrovascular accident (CVA) due to thrombosis of precerebral artery ([place]) 11/30/2019  . 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 Spastic hemiparesis affecting nondominant side ([place]) 11/30/2019  . Chronic bilateral low back pain with sciatica 11/30/2019  . Metatarsal fracture 09/22/2019  . Depression 09/22/2019  . Asthma 09/22/2019  . Palliative care by specialist   . Goals  of care, counseling/discussion   . Encephalopathy   . Acute CVA (cerebrovascular accident) ([place]) 09/14/2019  . Acute arterial ischemic stroke, multifocal, anterior circulation, right ([place]) 09/14/2019  . Severe comorbid illness   . AMS (altered mental status) 09/13/2019  . Controlled diabetes mellitus type 2 with complications ([place]) 16/08/9603  . Hyperlipidemia 09/02/2017  . Hypertension 09/02/2017  . Chest pain 09/02/2017   Deneise Lever, [place], [place] 04/16/2020, 11:33 AM  [facility] [address] [place], [place], [address] Phone: [phone]   Fax:  [phone]   Name: Christina Evans MRN:  [id number] Date of Birth: 1961-09-13

## 2020-04-16 NOTE — Patient Instructions (Addendum)
[email or web address]  Log in: SandraE Password: [email or web address]  Work up to 30 minutes a day. Start with 5-10 minutes at a time with breaks in between.  Other attention activities: Talk about familiar recipes (list ingredients, steps) or places she has been (describe).

## 2020-04-17 ENCOUNTER — Ambulatory Visit: Payer: Medicare Other | Admitting: Physical Therapy

## 2020-04-17 ENCOUNTER — Encounter: Payer: Self-pay | Admitting: Occupational Therapy

## 2020-04-17 ENCOUNTER — Other Ambulatory Visit: Payer: Self-pay

## 2020-04-17 ENCOUNTER — Ambulatory Visit: Payer: Medicare Other | Admitting: Occupational Therapy

## 2020-04-17 DIAGNOSIS — R293 Abnormal posture: Secondary | ICD-10-CM

## 2020-04-17 DIAGNOSIS — R29818 Other symptoms and signs involving the nervous system: Secondary | ICD-10-CM

## 2020-04-17 DIAGNOSIS — R2689 Other abnormalities of gait and mobility: Secondary | ICD-10-CM

## 2020-04-17 DIAGNOSIS — I69318 Other symptoms and signs involving cognitive functions following cerebral infarction: Secondary | ICD-10-CM

## 2020-04-17 DIAGNOSIS — R2681 Unsteadiness on feet: Secondary | ICD-10-CM

## 2020-04-17 DIAGNOSIS — I69354 Hemiplegia and hemiparesis following cerebral infarction affecting left non-dominant side: Secondary | ICD-10-CM

## 2020-04-17 DIAGNOSIS — M6281 Muscle weakness (generalized): Secondary | ICD-10-CM

## 2020-04-17 DIAGNOSIS — M79602 Pain in left arm: Secondary | ICD-10-CM

## 2020-04-17 DIAGNOSIS — M25622 Stiffness of left elbow, not elsewhere classified: Secondary | ICD-10-CM

## 2020-04-17 NOTE — Therapy (Signed)
[facility] [address] [place], [place], [address] Phone: [phone]   Fax:  [phone]  Physical Therapy Treatment  Patient Details  Name: Christina Evans MRN: [id number] Date of Birth: Dec 19, 1960 Referring Provider (PT): Garvin Fila, MD   Encounter Date: 04/17/2020  PT End of Session - 04/17/20 1023    Visit Number  14    Number of Visits  25    Date for PT Re-Evaluation  05/07/20   written for 60 day POC   Authorization Type  [employer] Medicare    PT Start Time  [phone]    PT Stop Time  0930    PT Time Calculation (min)  44 min    Equipment Utilized During Treatment  Gait belt    Activity Tolerance  Patient tolerated treatment well    Behavior During Therapy  [facility] for tasks assessed/performed       Past Medical History:  Diagnosis Date  . Asthma   . CVA (cerebral vascular accident) ([place])   . Diabetes mellitus without complication [facility])     Past Surgical History:  Procedure Laterality Date  . IR ANGIO INTRA EXTRACRAN SEL COM CAROTID INNOMINATE BILAT MOD SED  09/15/2019  . IR ANGIO VERTEBRAL SEL VERTEBRAL UNI L MOD SED  09/15/2019    There were no vitals filed for this visit.  Subjective Assessment - 04/17/20 0848    Subjective  No changes - nothing new.    Patient is accompained by:  Family member   daughter [name]   Pertinent History  PMH significant for large right MCA CVA, HTN, T2DM, HLD, hx of Left foot fifth metatarsal fracture with osteopenia, asthma.    Limitations  Standing;Walking    How long can you stand comfortably?  1 minute (when daughter is changing her when she is wearing diapers).    How long can you walk comfortably?  a few feet.    Diagnostic tests  MRI brain with moderate sized acute to early subacute left ACA territory infarct andlarge chronic right MCA territory infarct.    Patient Stated Goals  wants to get better, wants to be independent.    Currently in Pain?  No/denies                        Orthotic consult:  Gerald Stabs, orthotist, from [facility] present throughout session to determine best AFO for pt. Pt initially ambulating without AD and quad base cane with pt demonstrating decr L foot clearance, incr PF/extensor tone, incr pelvic retraction, and incr L foot pronation. Ambulated with the L blue rocker with pt having incr L foot clearance, however an anterior brace would cause incr hyperextension over time due to pt's extensor tone. Trialed [place] L PLS more rigid AFO with pt demonstrating incr L external rotation with pt reporting incr discomfort with more rigid brace. Lastly, trialed more flexible L custom AFO, which pt demonstrating improved gait mechanics. Orthotist concluded that a custom L AFO brace with arch support to help decr pronation would be best for pt to improve gait mechanics, stability, and to decr tone. Orthotist also instructing pt to purchase a new stabilizing shoe with a roll bar to decr pronation while ambulating. Therapist printed out examples of [employer] shoes that would fit criteria. Pt and pt's daughter verbalized understanding. [facility] to call pt to make appt to be fitted for new custom AFO.  PT Education - 04/17/20 1022    Education Details  see orthotic consult, making appt with [facility] for custom brace    Person(s) Educated  Patient;Child(ren)    Methods  Explanation;Demonstration;Handout    Comprehension  Verbalized understanding       PT Short Term Goals - 04/17/20 1024      PT SHORT TERM GOAL #1   Title  Pt will undergo further assessment of L AFO for gait, mobility, and transfers. ALL STGS DUE 04/16/20    Baseline  pt with orthotic consult on 04/17/20    Time  4   due to delay in scheduling   Period  Weeks    Status  Achieved    Target Date  04/16/20      PT SHORT TERM GOAL #2   Title  Pt will perform stand step transfers with supervision in order to decr caregiver burder.    Baseline   stand step with min guard and cues for incr weight shift to LLE    Time  4    Period  Weeks    Status  Partially Met      PT SHORT TERM GOAL #3   Title  Pt will tolerate static standing for at least 5 minutes with no UE/single UE support with min guar in order to improve tolerance for ADLs.    Baseline  not yet assessed    Time  4    Period  Weeks    Status  Deferred      PT SHORT TERM GOAL #4   Title  Pt will ambulate at least 100' with RW, L AFO and min guard in order to improve functional mobility.    Baseline  ambulates 115' with quad base cane, L AFO and min guard/min A for weight shifting    Time  4    Period  Weeks    Status  Partially Met      PT SHORT TERM GOAL #5   Title  Pt will undergo assessment of gait speed when appropriate.    Baseline  not yet assessed    Time  4    Period  Weeks    Status  Deferred        PT Long Term Goals - 02/07/20 1144      PT LONG TERM GOAL #1   Title  Pt will perform stand step transfers with no AD with mod I in order to decr caregiver burden. ALL LTGS DUE 05/08/20    Time  13    Period  Weeks    Status  New    Target Date  05/08/20      PT LONG TERM GOAL #2   Title  Pt will tolerate static standing for at least 5 minutes with single UE vs. no UE support in order to improve tolerance for ADLs.    Time  13    Period  Weeks    Status  New      PT LONG TERM GOAL #3   Title  Pt will ambulate at least 200' with hemiwalker vs. LRAD and supervision in order to improve functional houeshold mobility.    Time  13    Period  Weeks    Status  New      PT LONG TERM GOAL #4   Title  Pt will perform at least 5 sit <> stand transfers from mat table and standard height chair with mod I with no UE support vs.  single UE support in order to demo improved functional transfers    Time  13    Period  Weeks    Status  New      PT LONG TERM GOAL #5   Title  Pt and pt's daughter will be independent with final HEP for weight bearing/lower  extremity stretching/strengthening.    Time  13    Period  Weeks    Status  New            Plan - 04/17/20 1038    Clinical Impression Statement  Gerald Stabs, orthotist, from [place] present throughout session for AFO consult. Determined that L custom flexible PLS AFO would be best for pt to help decr pronation, improve stability during gait, and improve mechanics. Also instructed pt to purchase a new stabilizing shoe with a roll bar in order to help decr prontation with gait. Pt and pt's daughter verbalized understanding and will be called soon to make appt to be casted for new brace. Will continue to progress towards LTGs.    Personal Factors and Comorbidities  Comorbidity 3+;Past/Current Experience;Time since onset of injury/illness/exacerbation;Behavior Pattern    Comorbidities  PMH significant for large right MCA CVA, HTN, T2DM, HLD, hx of Left foot fifth metatarsal fracture with osteopenia, asthma.    Examination-Activity Limitations  Stand;Locomotion Level;Transfers;Toileting;Continence;Dressing    Examination-Participation Restrictions  Community Activity    Stability/Clinical Decision Making  Evolving/Moderate complexity    Rehab Potential  Good    PT Frequency  2x / week    PT Duration  12 weeks    PT Treatment/Interventions  ADLs/Self Care Home Management;Aquatic Therapy;Electrical Stimulation;DME Instruction;Gait training;Functional mobility training;Neuromuscular re-education;Balance training;Therapeutic exercise;Therapeutic activities;Patient/family education;Orthotic Fit/Training;Passive range of motion;Energy conservation    PT Next Visit Plan  tall kneeling? review exercises/stretches with daughter and update HEP as appropriate.  weight shifting towards L. gait training with RW (when appropriate with LUE spasticity) and hand hold assist, standing balance/weight shifting. anything in a mini squat position to break up tone/spasticity.    Consulted and Agree with Plan of Care   Patient;Family member/caregiver       Patient will benefit from skilled therapeutic intervention in order to improve the following deficits and impairments:  Abnormal gait, Decreased activity tolerance, Decreased coordination, Decreased balance, Decreased cognition, Decreased endurance, Decreased knowledge of use of DME, Decreased range of motion, Decreased strength, Difficulty walking, Impaired tone, Impaired UE functional use, Postural dysfunction, Impaired sensation  Visit Diagnosis: Unsteadiness on feet  Muscle weakness (generalized)  Other abnormalities of gait and mobility     Problem List Patient Active Problem List   Diagnosis Date Noted  . Cerebrovascular accident (CVA) due to thrombosis of precerebral artery ([place]) 11/30/2019  . 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 Spastic hemiparesis affecting nondominant side ([place]) 11/30/2019  . Chronic bilateral low back pain with sciatica 11/30/2019  . Metatarsal fracture 09/22/2019  . Depression 09/22/2019  . Asthma 09/22/2019  . Palliative care by specialist   . Goals of care, counseling/discussion   . Encephalopathy   . Acute CVA (cerebrovascular accident) ([place]) 09/14/2019  . Acute arterial ischemic stroke, multifocal, anterior circulation, right ([place]) 09/14/2019  . Severe comorbid illness   . AMS (altered mental status) 09/13/2019  . Controlled diabetes mellitus type 2 with complications ([place]) 50/38/8828  . Hyperlipidemia 09/02/2017  . Hypertension 09/02/2017  . Chest pain 09/02/2017    Arliss Journey, PT, DPT  04/17/2020, 10:40 AM  [facility]  [place] [address] [place], [place], [address] Phone: [phone]   Fax:  [phone]  Name: Chenille Toor MRN: [id number] Date of Birth: 1961-07-29

## 2020-04-17 NOTE — Therapy (Signed)
[facility] [address] [place], [place], [address] Phone: [phone]   Fax:  [phone]  Occupational Therapy Treatment  Patient Details  Name: Christina Evans MRN: [id number] Date of Birth: March 07, 1961 Referring Provider (OT): Dr. Pearlean Brownie   Encounter Date: 04/17/2020  OT End of Session - 04/17/20 1222    Visit Number  13    Number of Visits  24    Date for OT Re-Evaluation  05/21/20    Authorization Type  [employer] medicare and medicaid as secondary. Pt will need PN every 10th visit    Authorization Time Period  90 days - 05/21/2020    Authorization - Visit Number  13    Authorization - Number of Visits  20    OT Start Time  0931    OT Stop Time  1015    OT Time Calculation (min)  44 min    Activity Tolerance  Patient tolerated treatment well       Past Medical History:  Diagnosis Date  . Asthma   . CVA (cerebral vascular accident) (HCC)   . Diabetes mellitus without complication [facility])     Past Surgical History:  Procedure Laterality Date  . IR ANGIO INTRA EXTRACRAN SEL COM CAROTID INNOMINATE BILAT MOD SED  09/15/2019  . IR ANGIO VERTEBRAL SEL VERTEBRAL UNI L MOD SED  09/15/2019    There were no vitals filed for this visit.  Subjective Assessment - 04/17/20 0934    Subjective   This is hard to remeber (working on using rolling cart in kitchen)    Patient is accompanied by:  Family member   dtr   Pertinent History  Pt s/p recent R ACA CVA 09/13/19. 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    Currently in Pain?  No/denies                   OT Treatments/Exercises (OP) - 04/17/20 0001      ADLs   Cooking  Addressed simple beverage prep at ambulatory level using loaner AFO and quad cane.   Pt needs max cues for safety and sequencing as well as min guard for balance.  Pt also required significantly increased time. Attempted use of RW however spasticity too increased for use today despite tone reduction techniques. Pt scheduled for botox injection on 04/26/20 and will attempt after that. Pt would benefit from using RW for improved postural alignment and control, to incorporate LUE, for increased BOS and RW would also allow pt to carry items safely using walker bag or basket.        Neurological Re-education Exercises   Other Exercises 1  Neuro re ed in supine to address elbow flexion/extension, reduction of spasticity in LUE, postural alignment and control. Pt issued soft elbow splint for night time wear to encourage elbow extension. Dtr to assist with donning and doffing and pt stated :"its very comfortable."             OT Education - 04/17/20 1220    Education Details  use of soft elbow splint    Person(s) Educated  Patient;Child(ren)    Methods  Explanation;Demonstration;Handout    Comprehension  Verbalized understanding;Returned demonstration       OT Short Term Goals - 04/08/20 1038  OT SHORT TERM GOAL #1   Title  Pt and dtr will be mod  I with HEP to address ROM for RUE - 03/20/2020    Status  Achieved      OT SHORT TERM GOAL #2   Title  Pt and dtr will verbalize understanding for AE for cutting food on plate    Status  Achieved      OT SHORT TERM GOAL #3   Title  Pt will be mod I with washing face and set up for bushing teeth.    Status  Achieved      OT SHORT TERM GOAL #4   Title  Pt will be mod a for UB dressing    Status  Achieved      OT SHORT TERM GOAL #5   Title  Pt will be mod a for LB dressing    Status  Achieved      OT SHORT TERM GOAL #6   Title  Pt will require mod a to hike pants    Status  Achieved      OT SHORT TERM GOAL #7   Title  Pt and dtr will verbalize understanding of toileting schedule to address incontinence    Status   Achieved      OT SHORT TERM GOAL #8   Title  Pt and dtr will be mod with splint wear and care (resting hand splint at night)    Status  Achieved        OT Long Term Goals - 04/15/20 1216      OT LONG TERM GOAL #1   Title  Pt and dtr will be mod I with upgraded home activities program to include focus ADL's, transitional movements and balance. - 05/01/2020    Status  On-going      OT LONG TERM GOAL #2   Title  Pt will be supervision for toilet transfers    Status  Achieved      OT LONG TERM GOAL #3   Title  Pt will be no more than min a for hiking pants and toilet hygiene    Status  On-going      OT LONG TERM GOAL #4   Title  Pt will tolerate 110* of L shoulder flexion and 90* abduction during HEP for ease with ADL's with more than " a little " pain based on faces.    Status  Achieved      OT LONG TERM GOAL #5   Title  Pt will be supervision for simple snack prep    Status  On-going      OT LONG TERM GOAL #6   Title  Pt and dtr will complete FOTO outcome scale    Status  On-going            Plan - 04/17/20 1221    Clinical Impression Statement  Pt with slow progress toward goal. Pt with increasng spasticity in LUE however as botox injection scheduled for 04/26/2020    OT Occupational Profile and History  Detailed Assessment- Review of Records and additional review of physical, cognitive, psychosocial history related to current functional performance    Occupational performance deficits (Please refer to evaluation for details):  ADL's;IADL's;Work;Leisure;Social Participation    Body Structure / Function / Physical Skills  ADL;Balance;Continence;Endurance;GMC;IADL;Mobility;Sensation;ROM;Pain;Strength;Tone;UE functional use    Rehab Potential  Good    Clinical Decision Making  Multiple treatment options, significant modification of task necessary    Comorbidities Affecting Occupational Performance:  Presence of comorbidities impacting occupational performance     Comorbidities impacting occupational performance description:  see above    Modification or Assistance to Complete Evaluation   Max significant modification of tasks or assist is necessary to complete    OT Frequency  2x / week    OT Duration  12 weeks    OT Treatment/Interventions  Self-care/ADL training;Aquatic Therapy;Electrical Stimulation;Ultrasound;Moist Heat;Therapeutic exercise;Neuromuscular education;Manual Therapy;Functional Mobility Training;DME and/or AE instruction;Passive range of motion;Patient/family education;Cognitive remediation/compensation;Therapeutic activities;Splinting;Balance training    Plan  NMR for LUE/trunk, postural alignment and control, balance and functional mobility,    Consulted and Agree with Plan of Care  Patient;Family member/caregiver    Family Member Consulted  dtr       Patient will benefit from skilled therapeutic intervention in order to improve the following deficits and impairments:   Body Structure / Function / Physical Skills: ADL, Balance, Continence, Endurance, GMC, IADL, Mobility, Sensation, ROM, Pain, Strength, Tone, UE functional use       Visit Diagnosis: Unsteadiness on feet  Muscle weakness (generalized)  Abnormal posture  Other symptoms and signs involving the nervous system  Spastic hemiplegia of left nondominant side as late effect of cerebral infarction (HCC)  Stiffness of left upper arm joint  Pain in left arm  Other symptoms and signs involving cognitive functions following cerebral infarction    Problem List Patient Active Problem List   Diagnosis Date Noted  . Cerebrovascular accident (CVA) due to thrombosis of precerebral artery ([place]) 11/30/2019  . 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 Spastic hemiparesis affecting nondominant side ([place]) 11/30/2019  . Chronic bilateral low back pain with sciatica 11/30/2019  . Metatarsal fracture 09/22/2019  . Depression  09/22/2019  . Asthma 09/22/2019  . Palliative care by specialist   . Goals of care, counseling/discussion   . Encephalopathy   . Acute CVA (cerebrovascular accident) ([place]) 09/14/2019  . Acute arterial ischemic stroke, multifocal, anterior circulation, right ([place]) 09/14/2019  . Severe comorbid illness   . AMS (altered mental status) 09/13/2019  . Controlled diabetes mellitus type 2 with complications ([place]) 14/48/1856  . Hyperlipidemia 09/02/2017  . Hypertension 09/02/2017  . Chest pain 09/02/2017    Quay Burow, OTR/L 04/17/2020, 12:23 PM  [facility] [address] [place], [place], [address] Phone: [phone]   Fax:  [phone]  Name: Christina Evans MRN: [id number] Date of Birth: 1961-04-10

## 2020-04-19 ENCOUNTER — Ambulatory Visit: Payer: Medicare Other

## 2020-04-19 ENCOUNTER — Other Ambulatory Visit: Payer: Self-pay

## 2020-04-19 DIAGNOSIS — R41841 Cognitive communication deficit: Secondary | ICD-10-CM

## 2020-04-19 DIAGNOSIS — R2681 Unsteadiness on feet: Secondary | ICD-10-CM | POA: Diagnosis not present

## 2020-04-19 NOTE — Therapy (Signed)
[facility] [address] [place], [place], [address] Phone: [phone]   Fax:  [phone]  Speech Language Pathology Treatment  Patient Details  Name: Christina Evans MRN: [id number] Date of Birth: 03/06/1961 Referring Provider (SLP): Antony Contras, MD   Encounter Date: 04/19/2020  End of Session - 04/19/20 1301    Visit Number  3    Number of Visits  17    Date for SLP Re-Evaluation  06/19/20    SLP Start Time  0935    SLP Stop Time   1015    SLP Time Calculation (min)  40 min    Activity Tolerance  Patient tolerated treatment well       Past Medical History:  Diagnosis Date  . Asthma   . CVA (cerebral vascular accident) ([place])   . Diabetes mellitus without complication [facility])     Past Surgical History:  Procedure Laterality Date  . IR ANGIO INTRA EXTRACRAN SEL COM CAROTID INNOMINATE BILAT MOD SED  09/15/2019  . IR ANGIO VERTEBRAL SEL VERTEBRAL UNI L MOD SED  09/15/2019    There were no vitals filed for this visit.  Subjective Assessment - 04/19/20 0943    Subjective  Pt was faced immediately in front of SLP. Told SLP 3 things she did in past therapy.    Patient is accompained by:  Family member   Christina Evans, dtr   Currently in Pain?  No/denies            ADULT SLP TREATMENT - 04/19/20 0944      General Information   Behavior/Cognition  Distractible;Requires cueing;Decreased sustained attention      Treatment Provided   Treatment provided  Cognitive-Linquistic      Cognitive-Linquistic Treatment   Treatment focused on  Cognition;Patient/family/caregiver education    Skilled Treatment  SLP offered 2-3 more ideas for dughter for pt's atteniton practice at home. Pt recalled omelet ingredients with rare min A. Told SLP the sequence to make omelet with usual min A. SLP told pt she and SLP were working on "focus" during session and pt recalled this 5 minutes later. 11 minutes later pt told SLP "We  are working on my focus." SLP provided education regarding how to use simple "ST, PT, OT" or symbols for pt calendar since pt does not read, for her to look at her appointmetns instead of dtr always telling pt.       Assessment / Recommendations / Plan   Plan  Continue with current plan of care      Progression Toward Goals   Progression toward goals  Progressing toward goals       SLP Education - 04/19/20 1300    Education Details  write symbols or simple two-letter combinations for therapy appointments on pt calendar, home tasks for attention    Person(s) Educated  Patient    Methods  Explanation    Comprehension  Verbalized understanding       SLP Short Term Goals - 04/16/20 1132      SLP SHORT TERM GOAL #1   Title  pt will demonstrate attention appropriate to benefit from continued speech therapy over 4 sessions    Time  4    Period  Weeks   or 9 visits, for all STGs   Status  On-going      SLP SHORT TERM GOAL #2   Title  pt will demo selective attention to simple linguistic task for 90 seconds with occasionl min A  x 3 sessions    Time  4    Period  Weeks    Status  On-going       SLP Long Term Goals - 04/16/20 1132      SLP LONG TERM GOAL #1   Title  pt will demo sustained/selective attention for a simple 5 minute linguistic task with rare min A over 3 sessions    Time  8    Period  Weeks   or 17 total sessions, for all LTGS   Status  On-going      SLP LONG TERM GOAL #2   Title  pt will demonstrate attention appropriate for continued ST over 8 sessions    Time  8    Period  Weeks    Status  On-going       Plan - 04/19/20 1302    Clinical Impression Statement  Pt presents with severe cognitive linguistic deficits stemming primarily from significant attentional deficits - pt scored 4/30 on the [facility] Cognitive Assessment. She will do well to do tasks at home which are familiar and functional that she could perform for aprox 60 seconds. SLP told daughter that  pt may need to be redirected to task. Pt would benefit from skilled ST targeting attention. Pt may well benefit from attention med if not already prescribed. If an attention med would be initiated, re-evaluation using another cognitive insttrument may be necessary, and goals modified as needed.    Speech Therapy Frequency  2x / week    Duration  --   8 weeks, or 17 total sessions   Treatment/Interventions  Cognitive reorganization;Internal/external aids;Patient/family education;Compensatory strategies;SLP instruction and feedback;Cueing hierarchy;Functional tasks;Environmental controls;Multimodal communcation approach    Potential to Achieve Goals  Fair    Potential Considerations  Severity of impairments;Cooperation/participation level;Ability to learn/carryover information       Patient will benefit from skilled therapeutic intervention in order to improve the following deficits and impairments:   Cognitive communication deficit    Problem List Patient Active Problem List   Diagnosis Date Noted  . Cerebrovascular accident (CVA) due to thrombosis of precerebral artery (HCC) 11/30/2019  . 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 Spastic hemiparesis affecting nondominant side (HCC) 11/30/2019  . Chronic bilateral low back pain with sciatica 11/30/2019  . Metatarsal fracture 09/22/2019  . Depression 09/22/2019  . Asthma 09/22/2019  . Palliative care by specialist   . Goals of care, counseling/discussion   . Encephalopathy   . Acute CVA (cerebrovascular accident) (HCC) 09/14/2019  . Acute arterial ischemic stroke, multifocal, anterior circulation, right (HCC) 09/14/2019  . Severe comorbid illness   . AMS (altered mental status) 09/13/2019  . 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  . Hyperlipidemia 09/02/2017  . Hypertension 09/02/2017  . Chest pain 09/02/2017    [facility] ,MS,  CCC-SLP  04/19/2020, 1:03 PM  [facility] [address] [place], [place], [address] Phone: [phone]   Fax:  [phone]   Name: Christina Evans MRN: [id number] Date of Birth: 21-Nov-1961

## 2020-04-23 ENCOUNTER — Ambulatory Visit: Payer: Medicare Other | Attending: Neurology | Admitting: Speech Pathology

## 2020-04-23 ENCOUNTER — Other Ambulatory Visit: Payer: Self-pay

## 2020-04-23 ENCOUNTER — Ambulatory Visit: Payer: Medicare Other | Admitting: Physical Therapy

## 2020-04-23 DIAGNOSIS — M6281 Muscle weakness (generalized): Secondary | ICD-10-CM | POA: Diagnosis present

## 2020-04-23 DIAGNOSIS — I69354 Hemiplegia and hemiparesis following cerebral infarction affecting left non-dominant side: Secondary | ICD-10-CM | POA: Diagnosis present

## 2020-04-23 DIAGNOSIS — R2681 Unsteadiness on feet: Secondary | ICD-10-CM | POA: Insufficient documentation

## 2020-04-23 DIAGNOSIS — R41841 Cognitive communication deficit: Secondary | ICD-10-CM

## 2020-04-23 DIAGNOSIS — R293 Abnormal posture: Secondary | ICD-10-CM | POA: Insufficient documentation

## 2020-04-23 DIAGNOSIS — M79602 Pain in left arm: Secondary | ICD-10-CM | POA: Insufficient documentation

## 2020-04-23 DIAGNOSIS — R29818 Other symptoms and signs involving the nervous system: Secondary | ICD-10-CM | POA: Insufficient documentation

## 2020-04-23 DIAGNOSIS — R2689 Other abnormalities of gait and mobility: Secondary | ICD-10-CM

## 2020-04-23 DIAGNOSIS — M25622 Stiffness of left elbow, not elsewhere classified: Secondary | ICD-10-CM | POA: Diagnosis present

## 2020-04-23 DIAGNOSIS — I69318 Other symptoms and signs involving cognitive functions following cerebral infarction: Secondary | ICD-10-CM | POA: Insufficient documentation

## 2020-04-23 NOTE — Therapy (Signed)
[facility] [address] [place], [place], [address] Phone: [phone]   Fax:  [phone]  Physical Therapy Treatment  Patient Details  Name: Christina Evans MRN: [id number] Date of Birth: 02-24-1961 Referring Provider (PT): Garvin Fila, MD   Encounter Date: 04/23/2020  PT End of Session - 04/23/20 1311    Visit Number  15    Number of Visits  25    Date for PT Re-Evaluation  05/07/20   written for 60 day POC   Authorization Type  [employer] Medicare    PT Start Time  1147    PT Stop Time  1229    PT Time Calculation (min)  42 min    Equipment Utilized During Treatment  Gait belt    Activity Tolerance  Patient tolerated treatment well    Behavior During Therapy  WFL for tasks assessed/performed       Past Medical History:  Diagnosis Date   Asthma    CVA (cerebral vascular accident) ([place])    Diabetes mellitus without complication ([place])     Past Surgical History:  Procedure Laterality Date   IR ANGIO INTRA EXTRACRAN SEL COM CAROTID INNOMINATE BILAT MOD SED  09/15/2019   IR ANGIO VERTEBRAL SEL VERTEBRAL UNI L MOD SED  09/15/2019    There were no vitals filed for this visit.  Subjective Assessment - 04/23/20 1149    Subjective  Sees Dr. Joan Mayans for Botox on Friday.    Patient is accompained by:  Family member   daughter [name]   Pertinent History  PMH significant for large right MCA CVA, HTN, T2DM, HLD, hx of Left foot fifth metatarsal fracture with osteopenia, asthma.    Limitations  Standing;Walking    How long can you stand comfortably?  1 minute (when daughter is changing her when she is wearing diapers).    How long can you walk comfortably?  a few feet.    Diagnostic tests  MRI brain with moderate sized acute to early subacute left ACA territory infarct andlarge chronic right MCA territory infarct.    Patient Stated Goals  wants to get better, wants to be independent.    Currently in Pain?   No/denies                        [facility] Adult PT Treatment/Exercise - 04/23/20 0001      Bed Mobility   Rolling Left  Minimal Assistance - Patient > 75%      Transfers   Transfers  Sit to Stand;Stand to Sit    Sit to Stand  4: Min assist;4: Min guard    Sit to Stand Details  Manual facilitation for placement;Manual facilitation for weight shifting;Verbal cues for technique;Verbal cues for sequencing;Tactile cues for weight shifting;Tactile cues for sequencing    Stand to Sit  4: Min guard;4: Min assist    Stand to Sit Details (indicate cue type and reason)  Verbal cues for sequencing;Verbal cues for technique;Tactile cues for posture;Tactile cues for weight shifting;Manual facilitation for weight shifting    Stand to Sit Details  manual facilitation for pt to shift towards therapist's hip to maintain weight shift towards LLE prior to sitting and for transfer     Transfer Cueing  bed mobility: needed min A  to roll towards pt's L with assisting in pt get set up in hooklying position prior to rolling - pt with incr fear with rolling to L, from L  sidelying to sit, pt requiring min guard     Comments  with L PLS AFO donned: stand step transfer to and from mat table x2 reps with pt holding therapist with RUE, cues for weight shift on LLE and step length      Neuro Re-ed    Neuro Re-ed Details   Pt needing min A and mod verbal cues to get into tall kneeling position on mat table, with RUE on kaye bench: performed x5 reps tall kneeling mini squats with therapist to pt's L to facilitate weight shifting towards LLE, cues for hip extension, after a couple minutes pt requesting to get out of position due to feeling an incr stretch in anterior hips and had mild discomfort, cued pt to come down to R side to get out of position. At edge of mat table with mirror anteriorly and therapist providing assist in front of pt's LLE, had pt practice weight shift to LLE and take a step with RLE forward  and backwards with use of mirror as visual cue, pt with incr fear during initial reps and needed frequent encouragement from therapist to perform without UE support                PT Short Term Goals - 04/17/20 1024      PT SHORT TERM GOAL #1   Title  Pt will undergo further assessment of L AFO for gait, mobility, and transfers. ALL STGS DUE 04/16/20    Baseline  pt with orthotic consult on 04/17/20    Time  4   due to delay in scheduling   Period  Weeks    Status  Achieved    Target Date  04/16/20      PT SHORT TERM GOAL #2   Title  Pt will perform stand step transfers with supervision in order to decr caregiver burder.    Baseline  stand step with min guard and cues for incr weight shift to LLE    Time  4    Period  Weeks    Status  Partially Met      PT SHORT TERM GOAL #3   Title  Pt will tolerate static standing for at least 5 minutes with no UE/single UE support with min guar in order to improve tolerance for ADLs.    Baseline  pt able to perform dynamic weight shifting and static standing at edge of mat with min guard/min A for balance and weight shift for 4-5 minutes    Time  4    Period  Weeks    Status  Partially Met      PT SHORT TERM GOAL #4   Title  Pt will ambulate at least 100' with RW, L AFO and min guard in order to improve functional mobility.    Baseline  ambulates 115' with quad base cane, L AFO and min guard/min A for weight shifting    Time  4    Period  Weeks    Status  Partially Met      PT SHORT TERM GOAL #5   Title  Pt will undergo assessment of gait speed when appropriate.    Baseline  not yet assessed    Time  4    Period  Weeks    Status  Deferred        PT Long Term Goals - 02/07/20 1144      PT LONG TERM GOAL #1   Title  Pt will perform stand  step transfers with no AD with mod I in order to decr caregiver burden. ALL LTGS DUE 05/08/20    Time  13    Period  Weeks    Status  New    Target Date  05/08/20      PT LONG TERM GOAL #2    Title  Pt will tolerate static standing for at least 5 minutes with single UE vs. no UE support in order to improve tolerance for ADLs.    Time  13    Period  Weeks    Status  New      PT LONG TERM GOAL #3   Title  Pt will ambulate at least 200' with hemiwalker vs. LRAD and supervision in order to improve functional houeshold mobility.    Time  13    Period  Weeks    Status  New      PT LONG TERM GOAL #4   Title  Pt will perform at least 5 sit <> stand transfers from mat table and standard height chair with mod I with no UE support vs. single UE support in order to demo improved functional transfers    Time  13    Period  Weeks    Status  New      PT LONG TERM GOAL #5   Title  Pt and pt's daughter will be independent with final HEP for weight bearing/lower extremity stretching/strengthening.    Time  13    Period  Weeks    Status  New            Plan - 04/23/20 1509    Clinical Impression Statement  Performed tall kneeling for the first time today for weight shifting, LLE strengthening, trunk/core activation, and hip flexor stretching. Pt only able to tolerate position for a couple minutes due to feeling an incr stretch in L hip flexors. Pt initially fearful with weight shifting over to LLE and taking steps forward and back with RLE with no UE support, improved with incr reps and pt able to take larger steps for incr weight shift to LLE. Will continue to progress towards LTGs.    Personal Factors and Comorbidities  Comorbidity 3+;Past/Current Experience;Time since onset of injury/illness/exacerbation;Behavior Pattern    Comorbidities  PMH significant for large right MCA CVA, HTN, T2DM, HLD, hx of Left foot fifth metatarsal fracture with osteopenia, asthma.    Examination-Activity Limitations  Stand;Locomotion Level;Transfers;Toileting;Continence;Dressing    Examination-Participation Restrictions  Community Activity    Stability/Clinical Decision Making  Evolving/Moderate  complexity    Rehab Potential  Good    PT Frequency  2x / week    PT Duration  12 weeks    PT Treatment/Interventions  ADLs/Self Care Home Management;Aquatic Therapy;Electrical Stimulation;DME Instruction;Gait training;Functional mobility training;Neuromuscular re-education;Balance training;Therapeutic exercise;Therapeutic activities;Patient/family education;Orthotic Fit/Training;Passive range of motion;Energy conservation    PT Next Visit Plan  pt has appt with [facility] next week for brace. tall kneeling? review exercises/stretches with daughter and update HEP as appropriate.  weight shifting towards L. gait training with RW (when appropriate with LUE spasticity) and hand hold assist, standing balance/weight shifting. anything in a mini squat position to break up tone/spasticity.    Consulted and Agree with Plan of Care  Patient;Family member/caregiver       Patient will benefit from skilled therapeutic intervention in order to improve the following deficits and impairments:  Abnormal gait, Decreased activity tolerance, Decreased coordination, Decreased balance, Decreased cognition, Decreased endurance, Decreased knowledge of use of  DME, Decreased range of motion, Decreased strength, Difficulty walking, Impaired tone, Impaired UE functional use, Postural dysfunction, Impaired sensation  Visit Diagnosis: Unsteadiness on feet  Muscle weakness (generalized)  Other abnormalities of gait and mobility  Abnormal posture  Other symptoms and signs involving the nervous system     Problem List Patient Active Problem List   Diagnosis Date Noted   Cerebrovascular accident (CVA) due to thrombosis of precerebral artery ([place]) 11/30/2019   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 11/30/2019   Spastic hemiparesis affecting nondominant side ([place]) 11/30/2019   Chronic bilateral low back pain with sciatica 11/30/2019   Metatarsal fracture 09/22/2019    Depression 09/22/2019   Asthma 09/22/2019   Palliative care by specialist    Goals of care, counseling/discussion    Encephalopathy    Acute CVA (cerebrovascular accident) ([place]) 09/14/2019   Acute arterial ischemic stroke, multifocal, anterior circulation, right ([place]) 09/14/2019   Severe comorbid illness    AMS (altered mental status) 09/13/2019   Controlled diabetes mellitus type 2 with complications ([place]) 71/58/0638   Hyperlipidemia 09/02/2017   Hypertension 09/02/2017   Chest pain 09/02/2017    Arliss Journey, PT, DPT  04/23/2020, 3:11 PM  [facility] [address] [place], [place], [address] Phone: [phone]   Fax:  [phone]  Name: Christina Evans MRN: [id number] Date of Birth: [birth year]

## 2020-04-23 NOTE — Therapy (Signed)
[facility] [address] [place], [place], [address] Phone: [phone]   Fax:  [phone]  Speech Language Pathology Treatment  Patient Details  Name: Christina Evans MRN: [id number] Date of Birth: 04/05/1961 Referring Provider (SLP): Antony Contras, MD   Encounter Date: 04/23/2020  End of Session - 04/23/20 1223    Visit Number  4    Number of Visits  17    Date for SLP Re-Evaluation  06/19/20    SLP Start Time  1104    SLP Stop Time   1145    SLP Time Calculation (min)  41 min    Activity Tolerance  Patient tolerated treatment well       Past Medical History:  Diagnosis Date   Asthma    CVA (cerebral vascular accident) ([place])    Diabetes mellitus without complication ([place])     Past Surgical History:  Procedure Laterality Date   IR ANGIO INTRA EXTRACRAN SEL COM CAROTID INNOMINATE BILAT MOD SED  09/15/2019   IR ANGIO VERTEBRAL SEL VERTEBRAL UNI L MOD SED  09/15/2019    There were no vitals filed for this visit.  Subjective Assessment - 04/23/20 1107    Subjective  "We went fishing."    Patient is accompained by:  --   daughter dropped patient off   Currently in Pain?  No/denies            ADULT SLP TREATMENT - 04/23/20 1107      General Information   Behavior/Cognition  Distractible;Requires cueing;Decreased sustained attention      Treatment Provided   Treatment provided  Cognitive-Linquistic      Pain Assessment   Pain Assessment  No/denies pain      Cognitive-Linquistic Treatment   Treatment focused on  Cognition;Patient/family/caregiver education    Skilled Treatment  Daughter dropped patient off for ST today. Reports they have not worked much at home. Pt recalled some of last ST session (? accurately): "I told him how to make empanadas." SLP engaged pt in tasks to target sustained attention, including simple conversation, sequencing pictures (3-5 step sequences), and immediate  recall. Pt recall of images was 100% accuracy for 1 image from F:3, 90% accuracy for 2 images from F:6. SLP trained pt in use of strategies including verbalization/repetition and association; she required occasional min-mod A for this. Pt looked away x1 in simple conversation exchange (5 min), however responded and maintained topic appropriately. After 5 minutes, pt began telling a story of traveling from [place] but was tangential and required redirection.      Assessment / Recommendations / Plan   Plan  Continue with current plan of care      Progression Toward Goals   Progression toward goals  Progressing toward goals         SLP Short Term Goals - 04/23/20 1229      SLP SHORT TERM GOAL #1   Title  pt will demonstrate attention appropriate to benefit from continued speech therapy over 4 sessions    Time  3    Period  Weeks   or 9 visits, for all STGs   Status  On-going      SLP SHORT TERM GOAL #2   Title  pt will demo selective attention to simple linguistic task for 90 seconds with occasionl min A x 3 sessions    Baseline  04/23/20    Time  3    Period  Weeks    Status  On-going       SLP Long Term Goals - 04/23/20 1230      SLP LONG TERM GOAL #1   Title  pt will demo sustained/selective attention for a simple 5 minute linguistic task with rare min A over 3 sessions    Time  7    Period  Weeks   or 17 total sessions, for all LTGS   Status  On-going      SLP LONG TERM GOAL #2   Title  pt will demonstrate attention appropriate for continued ST over 8 sessions    Time  7    Period  Weeks    Status  On-going       Plan - 04/23/20 1223    Clinical Impression Statement  Pt presents with severe cognitive linguistic deficits stemming primarily from significant attentional deficits - pt scored 4/30 on the [facility] Cognitive Assessment. She will do well to do tasks at home which are familiar and functional that she could perform for aprox 60 seconds. Daughter not present for  session today; given severity of pt's deficits daughter's presence likely necessary for any carryover at home. Pt would benefit from skilled ST targeting attention. Pt may well benefit from attention med if not already prescribed. If an attention med would be initiated, re-evaluation using another cognitive insttrument may be necessary, and goals modified as needed.    Speech Therapy Frequency  2x / week    Duration  --   8 weeks, or 17 total sessions   Treatment/Interventions  Cognitive reorganization;Internal/external aids;Patient/family education;Compensatory strategies;SLP instruction and feedback;Cueing hierarchy;Functional tasks;Environmental controls;Multimodal communcation approach    Potential to Achieve Goals  Fair    Potential Considerations  Severity of impairments;Cooperation/participation level;Ability to learn/carryover information       Patient will benefit from skilled therapeutic intervention in order to improve the following deficits and impairments:   Cognitive communication deficit    Problem List Patient Active Problem List   Diagnosis Date Noted   Cerebrovascular accident (CVA) due to thrombosis of precerebral artery ([facility]) 11/30/2019   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 11/30/2019   Spastic hemiparesis affecting nondominant side ([facility]) 11/30/2019   Chronic bilateral low back pain with sciatica 11/30/2019   Metatarsal fracture 09/22/2019   Depression 09/22/2019   Asthma 09/22/2019   Palliative care by specialist    Goals of care, counseling/discussion    Encephalopathy    Acute CVA (cerebrovascular accident) ([facility]) 09/14/2019   Acute arterial ischemic stroke, multifocal, anterior circulation, right ([facility]) 09/14/2019   Severe comorbid illness    AMS (altered mental status) 09/13/2019   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   Hyperlipidemia 09/02/2017   Hypertension  09/02/2017   Chest pain 09/02/2017   Rondel Baton, MS, CCC-SLP Speech-Language Pathologist   Arlana Lindau 04/23/2020, 12:31 PM  [facility] [address] [place], [place], [address] Phone: [phone]   Fax:  [phone]   Name: Aprel Egelhoff MRN: [id number] Date of Birth: Aug 01, 1961

## 2020-04-25 ENCOUNTER — Ambulatory Visit: Payer: Medicare Other | Admitting: Occupational Therapy

## 2020-04-25 ENCOUNTER — Encounter: Payer: Self-pay | Admitting: Occupational Therapy

## 2020-04-25 ENCOUNTER — Encounter: Payer: Self-pay | Admitting: Physical Therapy

## 2020-04-25 ENCOUNTER — Ambulatory Visit: Payer: Medicare Other | Admitting: Physical Therapy

## 2020-04-25 ENCOUNTER — Other Ambulatory Visit: Payer: Self-pay

## 2020-04-25 DIAGNOSIS — I69318 Other symptoms and signs involving cognitive functions following cerebral infarction: Secondary | ICD-10-CM

## 2020-04-25 DIAGNOSIS — R29818 Other symptoms and signs involving the nervous system: Secondary | ICD-10-CM

## 2020-04-25 DIAGNOSIS — M6281 Muscle weakness (generalized): Secondary | ICD-10-CM

## 2020-04-25 DIAGNOSIS — R2681 Unsteadiness on feet: Secondary | ICD-10-CM

## 2020-04-25 DIAGNOSIS — R293 Abnormal posture: Secondary | ICD-10-CM

## 2020-04-25 DIAGNOSIS — M25622 Stiffness of left elbow, not elsewhere classified: Secondary | ICD-10-CM

## 2020-04-25 DIAGNOSIS — I69354 Hemiplegia and hemiparesis following cerebral infarction affecting left non-dominant side: Secondary | ICD-10-CM

## 2020-04-25 DIAGNOSIS — R41841 Cognitive communication deficit: Secondary | ICD-10-CM | POA: Diagnosis not present

## 2020-04-25 DIAGNOSIS — R2689 Other abnormalities of gait and mobility: Secondary | ICD-10-CM

## 2020-04-25 NOTE — Therapy (Signed)
[facility] [address] [place], [place], [address] Phone: [phone]   Fax:  [phone]  Occupational Therapy Treatment  Patient Details  Name: Christina Evans MRN: [id number] Date of Birth: [birth year] Referring Provider (OT): Dr. Pearlean Brownie   Encounter Date: 04/25/2020  OT End of Session - 04/25/20 1023    Visit Number  14    Number of Visits  24    Date for OT Re-Evaluation  05/21/20    Authorization Type  [employer] medicare and medicaid as secondary. Pt will need PN every 10th visit    Authorization Time Period  90 days - 05/21/2020    Authorization - Visit Number  14    Authorization - Number of Visits  20    OT Start Time  1018    OT Stop Time  1058    OT Time Calculation (min)  40 min    Activity Tolerance  Patient tolerated treatment well    Behavior During Therapy  WFL for tasks assessed/performed       Past Medical History:  Diagnosis Date  . Asthma   . CVA (cerebral vascular accident) (HCC)   . Diabetes mellitus without complication [facility])     Past Surgical History:  Procedure Laterality Date  . IR ANGIO INTRA EXTRACRAN SEL COM CAROTID INNOMINATE BILAT MOD SED  09/15/2019  . IR ANGIO VERTEBRAL SEL VERTEBRAL UNI L MOD SED  09/15/2019    There were no vitals filed for this visit.  Subjective Assessment - 04/25/20 1023    Subjective   Denies pain    Patient is accompanied by:  Family member   dtr   Pertinent History  Pt s/p recent R ACA CVA 09/13/19. 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    Currently in Pain?  No/denies          Treatment:Supine gentle passive stretch to elbow, forearm wrist and digits, pt performed supine trunk rotation to both sides and weightbearing through LUE in  sidelying, with mod facilitation. Pt transitioned to seated propped on elbow for body on arm movements, with  lateral trunk flexion and trunk rotation, mod facilitation followed by propping left elbow on small ball to perform lateral weight shifts and trunk rotation with shoulder abduction AA/ROM, mod facilitation. Self stretch to reach for the floor in sitting. Pt was more interactive today and motivated. Pt's botox was delayed until 6/15                   OT Short Term Goals - 04/08/20 1038      OT SHORT TERM GOAL #1   Title  Pt and dtr will be mod  I with HEP to address ROM for RUE - 03/20/2020    Status  Achieved      OT SHORT TERM GOAL #2   Title  Pt and dtr will verbalize understanding for AE for cutting food on plate    Status  Achieved      OT SHORT TERM GOAL #3   Title  Pt will be mod I with washing face and set up for bushing teeth.    Status  Achieved      OT SHORT TERM GOAL #4   Title  Pt will be mod a for  UB dressing    Status  Achieved      OT SHORT TERM GOAL #5   Title  Pt will be mod a for LB dressing    Status  Achieved      OT SHORT TERM GOAL #6   Title  Pt will require mod a to hike pants    Status  Achieved      OT SHORT TERM GOAL #7   Title  Pt and dtr will verbalize understanding of toileting schedule to address incontinence    Status  Achieved      OT SHORT TERM GOAL #8   Title  Pt and dtr will be mod with splint wear and care (resting hand splint at night)    Status  Achieved        OT Long Term Goals - 04/15/20 1216      OT LONG TERM GOAL #1   Title  Pt and dtr will be mod I with upgraded home activities program to include focus ADL's, transitional movements and balance. - 05/01/2020    Status  On-going      OT LONG TERM GOAL #2   Title  Pt will be supervision for toilet transfers    Status  Achieved      OT LONG TERM GOAL #3   Title  Pt will be no more than min a for hiking pants and toilet hygiene    Status  On-going       OT LONG TERM GOAL #4   Title  Pt will tolerate 110* of L shoulder flexion and 90* abduction during HEP for ease with ADL's with more than " a little " pain based on faces.    Status  Achieved      OT LONG TERM GOAL #5   Title  Pt will be supervision for simple snack prep    Status  On-going      OT LONG TERM GOAL #6   Title  Pt and dtr will complete FOTO outcome scale    Status  On-going            Plan - 04/25/20 1023    Clinical Impression Statement  Pt with slow progress toward goal. Pt with increasng spasticity in LUE however as botox injection re-scheduled for 6/15    OT Occupational Profile and History  Detailed Assessment- Review of Records and additional review of physical, cognitive, psychosocial history related to current functional performance    Occupational performance deficits (Please refer to evaluation for details):  ADL's;IADL's;Work;Leisure;Social Participation    Body Structure / Function / Physical Skills  ADL;Balance;Continence;Endurance;GMC;IADL;Mobility;Sensation;ROM;Pain;Strength;Tone;UE functional use    Rehab Potential  Good    Clinical Decision Making  Multiple treatment options, significant modification of task necessary    Comorbidities Affecting Occupational Performance:  Presence of comorbidities impacting occupational performance    Comorbidities impacting occupational performance description:  see above    Modification or Assistance to Complete Evaluation   Max significant modification of tasks or assist is necessary to complete    OT Frequency  2x / week    OT Duration  12 weeks    OT Treatment/Interventions  Self-care/ADL training;Aquatic Therapy;Electrical Stimulation;Ultrasound;Moist Heat;Therapeutic exercise;Neuromuscular education;Manual Therapy;Functional Mobility Training;DME and/or AE instruction;Passive range of motion;Patient/family education;Cognitive remediation/compensation;Therapeutic activities;Splinting;Balance training    Plan  NMR  for LUE/trunk, postural alignment and control, balance and functional mobility,    Consulted and Agree with Plan of Care  Patient;Family member/caregiver    Family Member Consulted  dtr       Patient will benefit from skilled therapeutic intervention in order to improve the following deficits and impairments:   Body Structure / Function / Physical Skills: ADL, Balance, Continence, Endurance, GMC, IADL, Mobility, Sensation, ROM, Pain, Strength, Tone, UE functional use       Visit Diagnosis: Muscle weakness (generalized)  Abnormal posture  Other symptoms and signs involving the nervous system  Other symptoms and signs involving cognitive functions following cerebral infarction  Stiffness of left upper arm joint  Spastic hemiplegia of left nondominant side as late effect of cerebral infarction [facility])    Problem List Patient Active Problem List   Diagnosis Date Noted  . Cerebrovascular accident (CVA) due to thrombosis of precerebral artery (HCC) 11/30/2019  . 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 Spastic hemiparesis affecting nondominant side (HCC) 11/30/2019  . Chronic bilateral low back pain with sciatica 11/30/2019  . Metatarsal fracture 09/22/2019  . Depression 09/22/2019  . Asthma 09/22/2019  . Palliative care by specialist   . Goals of care, counseling/discussion   . Encephalopathy   . Acute CVA (cerebrovascular accident) (HCC) 09/14/2019  . Acute arterial ischemic stroke, multifocal, anterior circulation, right (HCC) 09/14/2019  . Severe comorbid illness   . AMS (altered mental status) 09/13/2019  . 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  . Hyperlipidemia 09/02/2017  . Hypertension 09/02/2017  . Chest pain 09/02/2017    Janus Vlcek 04/25/2020, 10:44 AM  [facility] [address] [place], [place], [address] Phone:  [phone]   Fax:  [phone]  Name: Timberlynn Kizziah MRN: [id number] Date of Birth: 1960-11-27

## 2020-04-25 NOTE — Patient Instructions (Signed)
Access Code: [email or web address] URL: [email or web address] Date: 02/15/2020 Prepared by: Sherlie Ban  Exercises Supine Bridge - 1 x daily - 7 x weekly - 2 sets - 10 reps Supine Hip and Knee Flexion PROM with Caregiver - 2 x daily - 7 x weekly - 1 sets - 10 reps Supine Ankle Dorsiflexion Stretch with Caregiver - 2 x daily - 7 x weekly - 3 sets - 10-15 hold Supine March - 1 x daily - 7 x weekly - 2 sets - 10 reps Lower Trunk Rotations - 1-2 x daily - 7 x weekly - 2 sets - 10 reps Modified Thomas Stretch - 1 x daily - 7 x weekly - 3 sets - 60 hold

## 2020-04-25 NOTE — Therapy (Signed)
[facility] [address] [place], [place], [address] Phone: [phone]   Fax:  [phone]  Physical Therapy Treatment  Patient Details  Name: Christina Evans MRN: [id number] Date of Birth: [birth year] Referring Provider (PT): Garvin Fila, MD   Encounter Date: 04/25/2020  PT End of Session - 04/25/20 1023    Visit Number  16    Number of Visits  25    Date for PT Re-Evaluation  05/07/20   written for 60 day POC   Authorization Type  UHC Medicare    PT Start Time  [phone]   pt arrived late   PT Stop Time  1015    PT Time Calculation (min)  37 min    Equipment Utilized During Treatment  Gait belt    Activity Tolerance  Patient tolerated treatment well    Behavior During Therapy  [facility] for tasks assessed/performed       Past Medical History:  Diagnosis Date  . Asthma   . CVA (cerebral vascular accident) ([place])   . Diabetes mellitus without complication [facility])     Past Surgical History:  Procedure Laterality Date  . IR ANGIO INTRA EXTRACRAN SEL COM CAROTID INNOMINATE BILAT MOD SED  09/15/2019  . IR ANGIO VERTEBRAL SEL VERTEBRAL UNI L MOD SED  09/15/2019    There were no vitals filed for this visit.  Subjective Assessment - 04/25/20 0941    Subjective  Appointment for botox tomorrow got cancelled (pt's daughter unsure why, just received a call), and that it has been rescheduled for 05/07/20. Going to [facility] next Tuesday to be casted for AFO.    Patient is accompained by:  Family member   daughter [name]   Pertinent History  PMH significant for large right MCA CVA, HTN, T2DM, HLD, hx of Left foot fifth metatarsal fracture with osteopenia, asthma.    Limitations  Standing;Walking    How long can you stand comfortably?  1 minute (when daughter is changing her when she is wearing diapers).    How long can you walk comfortably?  a few feet.    Diagnostic tests  MRI brain with moderate sized acute to early  subacute left ACA territory infarct andlarge chronic right MCA territory infarct.    Patient Stated Goals  wants to get better, wants to be independent.    Currently in Pain?  No/denies                      Access Code: [email or web address] URL: [email or web address] Date: 02/15/2020 Prepared by: Janann August  Reviewed HEP for ROM/spasticity management, and strengthening for home as pt and pt's daughter reports they have not been performing. Pt's daughter verbalized understanding, also provided handout.   Exercises Supine Bridge - 1 x daily - 7 x weekly - 2 sets - 10 reps Supine Hip and Knee Flexion PROM with Caregiver - 2 x daily - 7 x weekly - 1 sets - 10 reps Supine Ankle Dorsiflexion Stretch with Caregiver - 2 x daily - 7 x weekly - 3 sets - 10-15 hold Supine March - 1 x daily - 7 x weekly - 2 sets - 10 reps Lower Trunk Rotations - 1-2 x daily - 7 x weekly - 2 sets - 10 reps Modified Thomas Stretch - 1 x daily - 7 x weekly - 3 sets - 60 hold    [facility] Adult PT Treatment/Exercise - 04/25/20 0001      Transfers  Transfers  Sit to Stand;Stand to Sit    Sit to Stand  4: Min assist    Sit to Stand Details  Manual facilitation for placement;Manual facilitation for weight shifting;Verbal cues for technique;Verbal cues for sequencing;Tactile cues for weight shifting;Tactile cues for sequencing    Sit to Stand Details (indicate cue type and reason)  with therapist sitting next to pt on L cues to shift forward and stand without UE support and then sit down with pt's L hip/shoulder touching therapist for incr weight shift to LLE and to sit with no UE support, pt less fearful to perform today x5  reps     Stand to Sit  4: Min guard;4: Min assist    Stand to Sit Details (indicate cue type and reason)  Verbal cues for sequencing;Verbal cues for technique;Tactile cues for posture;Tactile cues for weight shifting;Manual facilitation for weight shifting    Stand to Sit Details   manual facilitation to weight shift towards LLE before sitting     Transfer Cueing  bed mobility: min A to assist with scooting, verbal cues for L sidelying and to push through RUE to come to sit, min guard when performing, pt less fearful today when rolling to L      Neuro Re-ed    Neuro Re-ed Details   At edge of mat table with mirror anteriorly and therapist providing assist in front of pt's LLE, had pt practice weight shift to LLE and take a step with RLE forward and backwards with use of mirror as visual cue, pt with incr fear during initial reps and needed frequent encouragement from therapist to perform without UE support x10 reps, took incr time due to pt being easily distractable when performing              PT Education - 04/25/20 1023    Education Details  reviewed HEP with pt and pt's daughter as reports they have not been performing at home.    Person(s) Educated  Patient;Child(ren)    Methods  Explanation;Demonstration;Handout    Comprehension  Verbalized understanding;Returned demonstration       PT Short Term Goals - 04/17/20 1024      PT SHORT TERM GOAL #1   Title  Pt will undergo further assessment of L AFO for gait, mobility, and transfers. ALL STGS DUE 04/16/20    Baseline  pt with orthotic consult on 04/17/20    Time  4   due to delay in scheduling   Period  Weeks    Status  Achieved    Target Date  04/16/20      PT SHORT TERM GOAL #2   Title  Pt will perform stand step transfers with supervision in order to decr caregiver burder.    Baseline  stand step with min guard and cues for incr weight shift to LLE    Time  4    Period  Weeks    Status  Partially Met      PT SHORT TERM GOAL #3   Title  Pt will tolerate static standing for at least 5 minutes with no UE/single UE support with min guar in order to improve tolerance for ADLs.    Baseline  pt able to perform dynamic weight shifting and static standing at edge of mat with min guard/min A for balance and  weight shift for 4-5 minutes    Time  4    Period  Weeks    Status  Partially Met  PT SHORT TERM GOAL #4   Title  Pt will ambulate at least 100' with RW, L AFO and min guard in order to improve functional mobility.    Baseline  ambulates 115' with quad base cane, L AFO and min guard/min A for weight shifting    Time  4    Period  Weeks    Status  Partially Met      PT SHORT TERM GOAL #5   Title  Pt will undergo assessment of gait speed when appropriate.    Baseline  not yet assessed    Time  4    Period  Weeks    Status  Deferred        PT Long Term Goals - 02/07/20 1144      PT LONG TERM GOAL #1   Title  Pt will perform stand step transfers with no AD with mod I in order to decr caregiver burden. ALL LTGS DUE 05/08/20    Time  13    Period  Weeks    Status  New    Target Date  05/08/20      PT LONG TERM GOAL #2   Title  Pt will tolerate static standing for at least 5 minutes with single UE vs. no UE support in order to improve tolerance for ADLs.    Time  13    Period  Weeks    Status  New      PT LONG TERM GOAL #3   Title  Pt will ambulate at least 200' with hemiwalker vs. LRAD and supervision in order to improve functional houeshold mobility.    Time  13    Period  Weeks    Status  New      PT LONG TERM GOAL #4   Title  Pt will perform at least 5 sit <> stand transfers from mat table and standard height chair with mod I with no UE support vs. single UE support in order to demo improved functional transfers    Time  13    Period  Weeks    Status  New      PT LONG TERM GOAL #5   Title  Pt and pt's daughter will be independent with final HEP for weight bearing/lower extremity stretching/strengthening.    Time  13    Period  Weeks    Status  New            Plan - 04/25/20 1228    Clinical Impression Statement  Pt's appointment to get LUE Botox has been rescheduled from 04/26/20 to 05/07/20. Reviewed HEP with pt and pt's daughter due to reports of not  being performed at home, educated on importance for spasticity management and ROM. Pt needing mod verbal cues for attending to activity today as pt easily distractable and for encouragement to perform. Will continue to progress towards LTGs.    Personal Factors and Comorbidities  Comorbidity 3+;Past/Current Experience;Time since onset of injury/illness/exacerbation;Behavior Pattern    Comorbidities  PMH significant for large right MCA CVA, HTN, T2DM, HLD, hx of Left foot fifth metatarsal fracture with osteopenia, asthma.    Examination-Activity Limitations  Stand;Locomotion Level;Transfers;Toileting;Continence;Dressing    Examination-Participation Restrictions  Community Activity    Stability/Clinical Decision Making  Evolving/Moderate complexity    Rehab Potential  Good    PT Frequency  2x / week    PT Duration  12 weeks    PT Treatment/Interventions  ADLs/Self Care Home Management;Aquatic Therapy;Electrical Stimulation;DME Instruction;Gait  training;Functional mobility training;Neuromuscular re-education;Balance training;Therapeutic exercise;Therapeutic activities;Patient/family education;Orthotic Fit/Training;Passive range of motion;Energy conservation    PT Next Visit Plan  pt has appt with [facility] next week for brace. tall kneeling? review exercises/stretches with daughter and update HEP as appropriate.  weight shifting towards L. gait training with RW (when appropriate with LUE spasticity) and hand hold assist, standing balance/weight shifting. anything in a mini squat position to break up tone/spasticity.    Consulted and Agree with Plan of Care  Patient;Family member/caregiver       Patient will benefit from skilled therapeutic intervention in order to improve the following deficits and impairments:  Abnormal gait, Decreased activity tolerance, Decreased coordination, Decreased balance, Decreased cognition, Decreased endurance, Decreased knowledge of use of DME, Decreased range of motion,  Decreased strength, Difficulty walking, Impaired tone, Impaired UE functional use, Postural dysfunction, Impaired sensation  Visit Diagnosis: Unsteadiness on feet  Muscle weakness (generalized)  Other abnormalities of gait and mobility  Abnormal posture  Other symptoms and signs involving the nervous system     Problem List Patient Active Problem List   Diagnosis Date Noted  . Cerebrovascular accident (CVA) due to thrombosis of precerebral artery ([place]) 11/30/2019  . 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 Spastic hemiparesis affecting nondominant side ([place]) 11/30/2019  . Chronic bilateral low back pain with sciatica 11/30/2019  . Metatarsal fracture 09/22/2019  . Depression 09/22/2019  . Asthma 09/22/2019  . Palliative care by specialist   . Goals of care, counseling/discussion   . Encephalopathy   . Acute CVA (cerebrovascular accident) ([place]) 09/14/2019  . Acute arterial ischemic stroke, multifocal, anterior circulation, right ([place]) 09/14/2019  . Severe comorbid illness   . AMS (altered mental status) 09/13/2019  . Controlled diabetes mellitus type 2 with complications ([place]) 77/93/9030  . Hyperlipidemia 09/02/2017  . Hypertension 09/02/2017  . Chest pain 09/02/2017    Arliss Journey, PT, DPT  04/25/2020, 12:44 PM  [facility] [address] [place], [place], [address] Phone: [phone]   Fax:  [phone]  Name: Christina Evans MRN: [id number] Date of Birth: 03/18/1961

## 2020-04-26 ENCOUNTER — Ambulatory Visit: Payer: Medicare Other

## 2020-04-26 ENCOUNTER — Encounter: Payer: Medicare Other | Admitting: Physical Medicine & Rehabilitation

## 2020-04-26 MED FILL — metFORMIN HCL 1000 MG TABS: 1000 | 90 days supply | Qty: 180 | Fill #2

## 2020-04-29 ENCOUNTER — Encounter: Payer: Self-pay | Admitting: Physical Therapy

## 2020-04-29 ENCOUNTER — Ambulatory Visit: Payer: Medicare Other

## 2020-04-29 ENCOUNTER — Other Ambulatory Visit: Payer: Self-pay

## 2020-04-29 ENCOUNTER — Encounter: Payer: Self-pay | Admitting: Occupational Therapy

## 2020-04-29 ENCOUNTER — Ambulatory Visit: Payer: Medicare Other | Admitting: Occupational Therapy

## 2020-04-29 ENCOUNTER — Ambulatory Visit: Payer: Medicare Other | Admitting: Physical Therapy

## 2020-04-29 DIAGNOSIS — R29818 Other symptoms and signs involving the nervous system: Secondary | ICD-10-CM

## 2020-04-29 DIAGNOSIS — R41841 Cognitive communication deficit: Secondary | ICD-10-CM | POA: Diagnosis not present

## 2020-04-29 DIAGNOSIS — I69354 Hemiplegia and hemiparesis following cerebral infarction affecting left non-dominant side: Secondary | ICD-10-CM

## 2020-04-29 DIAGNOSIS — R2681 Unsteadiness on feet: Secondary | ICD-10-CM

## 2020-04-29 DIAGNOSIS — M6281 Muscle weakness (generalized): Secondary | ICD-10-CM

## 2020-04-29 DIAGNOSIS — R293 Abnormal posture: Secondary | ICD-10-CM

## 2020-04-29 DIAGNOSIS — I69318 Other symptoms and signs involving cognitive functions following cerebral infarction: Secondary | ICD-10-CM

## 2020-04-29 DIAGNOSIS — M25622 Stiffness of left elbow, not elsewhere classified: Secondary | ICD-10-CM

## 2020-04-29 NOTE — Therapy (Signed)
[facility] [address] [place], [place], [address] Phone: [phone]   Fax:  [phone]  Speech Language Pathology Treatment  Patient Details  Name: Christina Evans MRN: [id number] Date of Birth: 12-Jul-1961 Referring Provider (SLP): Antony Contras, MD   Encounter Date: 04/29/2020  End of Session - 04/29/20 1146    Visit Number  5    Number of Visits  17    Date for SLP Re-Evaluation  06/19/20    SLP Start Time  1016    SLP Stop Time   1100    SLP Time Calculation (min)  44 min    Activity Tolerance  Patient tolerated treatment well       Past Medical History:  Diagnosis Date   Asthma    CVA (cerebral vascular accident) ([place])    Diabetes mellitus without complication ([place])     Past Surgical History:  Procedure Laterality Date   IR ANGIO INTRA EXTRACRAN SEL COM CAROTID INNOMINATE BILAT MOD SED  09/15/2019   IR ANGIO VERTEBRAL SEL VERTEBRAL UNI L MOD SED  09/15/2019    There were no vitals filed for this visit.  Subjective Assessment - 04/29/20 1031    Subjective  "[name]'s not here."    Currently in Pain?  No/denies            ADULT SLP TREATMENT - 04/29/20 1033      General Information   Behavior/Cognition  Distractible;Requires cueing;Decreased sustained attention      Treatment Provided   Treatment provided  Cognitive-Linquistic      Cognitive-Linquistic Treatment   Treatment focused on  Cognition;Patient/family/caregiver education    Skilled Treatment  Pt and SLP engaged in conversation about her weekend and family difficulties for 9 minutes - pt with tangential and circuitous explanation and req'd questioning cues from SLP for clarifiction. Pt without knowledge of tnagential nature of her explanation. Pt recalled some details of last ST session - looking at pictures. SLP addressed pt's note from her PT and OT about what pt needs to do each night. SLP reviewed this with pt and  ensured her understanding about why these things are necessary. SLP added to this handout that pt could count her arm and leg exercise reps to assist with attention. SLP met pt's dtr in hallway outside SLP room and showed her [name]) the note from OT/PT and the ST notes added as well. Patient gave note to dtr.       Assessment / Recommendations / Plan   Plan  Continue with current plan of care      Progression Toward Goals   Progression toward goals  Progressing toward goals       SLP Education - 04/29/20 1145    Education Details  daily "to-do" items on PT/OT handout and rationale, she should count reps of her arm and let exercises    Person(s) Educated  Patient    Methods  Explanation;Handout    Comprehension  Verbalized understanding;Need further instruction       SLP Short Term Goals - 04/29/20 1149      SLP SHORT TERM GOAL #1   Title  pt will demonstrate attention appropriate to benefit from continued speech therapy over 4 sessions    Time  2    Period  Weeks   or 9 visits, for all STGs   Status  On-going      SLP SHORT TERM GOAL #2   Title  pt will demo selective attention  to simple linguistic task for 90 seconds with occasionl min A x 3 sessions    Baseline  04/23/20, 04-29-20    Time  2    Period  Weeks    Status  On-going       SLP Long Term Goals - 04/29/20 1149      SLP LONG TERM GOAL #1   Title  pt will demo sustained/selective attention for a simple 5 minute linguistic task with rare min A over 3 sessions    Time  6    Period  Weeks   or 17 total sessions, for all LTGS   Status  On-going      SLP LONG TERM GOAL #2   Title  pt will demonstrate attention appropriate for continued ST over 8 sessions    Time  6    Period  Weeks    Status  On-going       Plan - 04/29/20 1147    Clinical Impression Statement  Pt presents with significant cognitive linguistic deficits stemming primarily from attentional deficits - pt appeared better able to hold a  conversation today, with some assistance back to the topic due to circuitous and meandering explanation. She will cont to do well to do tasks at home which are familiar and functional such as counting the reps of her arm and leg exercises. Daughter not present for session today; given severity of pt's deficits daughter's presence likely necessary for any carryover at home. OT/PT provided handout for daily needs (exercises and splint wear). Pt would cont to benefit from skilled ST targeting primarily attention. Pt may well benefit from attention med if not already prescribed. If an attention med would be initiated, re-evaluation using another cognitive insttrument may be necessary, and goals modified as needed.    Speech Therapy Frequency  2x / week    Duration  --   8 weeks, or 17 total sessions   Treatment/Interventions  Cognitive reorganization;Internal/external aids;Patient/family education;Compensatory strategies;SLP instruction and feedback;Cueing hierarchy;Functional tasks;Environmental controls;Multimodal communcation approach    Potential to Achieve Goals  Fair    Potential Considerations  Severity of impairments;Cooperation/participation level;Ability to learn/carryover information       Patient will benefit from skilled therapeutic intervention in order to improve the following deficits and impairments:   Cognitive communication deficit    Problem List Patient Active Problem List   Diagnosis Date Noted   Cerebrovascular accident (CVA) due to thrombosis of precerebral artery ([place]) 11/30/2019   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 11/30/2019   Spastic hemiparesis affecting nondominant side ([place]) 11/30/2019   Chronic bilateral low back pain with sciatica 11/30/2019   Metatarsal fracture 09/22/2019   Depression 09/22/2019   Asthma 09/22/2019   Palliative care by specialist    Goals of care, counseling/discussion    Encephalopathy     Acute CVA (cerebrovascular accident) ([facility]) 09/14/2019   Acute arterial ischemic stroke, multifocal, anterior circulation, right ([facility]) 09/14/2019   Severe comorbid illness    AMS (altered mental status) 09/13/2019   Controlled diabetes mellitus type 2 with complications ([facility] [place]) 62/13/0865   Hyperlipidemia 09/02/2017   Hypertension 09/02/2017   Chest pain 09/02/2017    [place] ,[place], CCC-SLP  04/29/2020, 11:50 AM  [facility] [address] [place], [place], [address] Phone: [phone]   Fax:  [phone]   Name: Sharika Mosquera MRN: [id number] Date of Birth: Dec 02, 1960

## 2020-04-29 NOTE — Patient Instructions (Signed)
  Christina Evans could count the repetitions on her arm and her leg exercises to help with her attention.

## 2020-04-29 NOTE — Therapy (Signed)
[facility] [address] [place], [place], [address] Phone: [phone]   Fax:  [phone]  Occupational Therapy Treatment  Patient Details  Name: Christina Evans MRN: [id number] Date of Birth: [birth year] Referring Provider (OT): Dr. Pearlean Brownie   Encounter Date: 04/29/2020  OT End of Session - 04/29/20 1050    Visit Number  15    Number of Visits  24    Date for OT Re-Evaluation  06/24/20   renewed on 04/29/2020   Authorization Type  [employer] medicare and medicaid as secondary. Pt will need PN every 10th visit    Authorization Time Period  90 days - 07/22/2020 (renwewed on 04/29/2020)    Authorization - Visit Number  15    Authorization - Number of Visits  20    OT Start Time  [phone]   pt arrived late   OT Stop Time  0930    OT Time Calculation (min)  38 min    Activity Tolerance  Patient tolerated treatment well       Past Medical History:  Diagnosis Date  . Asthma   . CVA (cerebral vascular accident) ([facility])   . Diabetes mellitus without complication [facility])     Past Surgical History:  Procedure Laterality Date  . IR ANGIO INTRA EXTRACRAN SEL COM CAROTID INNOMINATE BILAT MOD SED  09/15/2019  . IR ANGIO VERTEBRAL SEL VERTEBRAL UNI L MOD SED  09/15/2019    There were no vitals filed for this visit.  Subjective Assessment - 04/29/20 0855    Subjective   I went my grand daughter's birthday party this weekend.    Pertinent History  Pt s/p recent R ACA CVA 09/13/19. 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    Currently in Pain?  No/denies                   OT Treatments/Exercises (OP) - 04/29/20 0001      ADLs   Functional Mobility  Addressed bed mobility for scooting, sit to sidelying to supine, rolling  L and sidelying to sit. Pt needs max cues and min a.  Pt reported at end of session that she felt a bit lightheaded and " my stomach doesnt feel too good - I didn't eat anything this morning" Provided crackers and pt reported she felt better.  BP was 147/85.      ADL Comments  Pt reports she is not consistently wearing splint on L hand at night, not doing HEP for L arm. Reinforced importance of consistency especially in light of increasing spasticity as pt is at high risk for contracture and initially has increased pain with any arm movement. Daughter not present so put concens in writing to be shared and reinforced with dtr.  Botox injection to LUE was cancelled by MD but has been rescheduled for 05/07/20.        Neurological Re-education Exercises   Other Exercises 1  Neuro re ed to address attention and activation of LUE and trunk in supine for elbow flexion/extension, ab/adduction, shoulder flexion/extension with moderate facilitation. Pt needs max cues for attention and to follow throught with commands.               OT Education - 04/29/20 1044    Education  Details  reinforced importance of HEP and splint for LUE.    Person(s) Educated  Patient    Methods  Explanation    Comprehension  Verbalized understanding       OT Short Term Goals - 04/29/20 1045      OT SHORT TERM GOAL #1   Title  Pt and dtr will be mod  I with HEP to address ROM for RUE - 03/20/2020    Status  Achieved      OT SHORT TERM GOAL #2   Title  Pt and dtr will verbalize understanding for AE for cutting food on plate    Status  Achieved      OT SHORT TERM GOAL #3   Title  Pt will be mod I with washing face and set up for bushing teeth.    Status  Achieved      OT SHORT TERM GOAL #4   Title  Pt will be mod a for UB dressing    Status  Achieved      OT SHORT TERM GOAL #5   Title  Pt will be mod a for LB dressing    Status  Achieved      Additional Short Term Goals   Additional Short Term Goals  Yes       OT SHORT TERM GOAL #6   Title  Pt will require mod a to hike pants    Status  Achieved      OT SHORT TERM GOAL #7   Title  Pt and dtr will verbalize understanding of toileting schedule to address incontinence    Status  Achieved      OT SHORT TERM GOAL #8   Title  Pt and dtr will be mod with splint wear and care (resting hand splint at night)    Status  Achieved      OT SHORT TERM GOAL  #9   TITLE  Pt will demonstrate ability to set table and wipe table (with dishes/silverware set on table) with supervision - 05/27/2020    Status  New        OT Long Term Goals - 04/29/20 1049      OT LONG TERM GOAL #1   Title  Pt and dtr will be mod I with upgraded home activities program to include focus ADL's, transitional movements and balance. - 06/24/2020    Status  On-going      OT LONG TERM GOAL #2   Title  Pt will be supervision for toilet transfers    Status  Achieved      OT LONG TERM GOAL #3   Title  Pt will be no more than min a for hiking pants and toilet hygiene    Status  On-going      OT LONG TERM GOAL #4   Title  Pt will tolerate 110* of L shoulder flexion and 90* abduction during HEP for ease with ADL's with more than " a little " pain based on faces.    Status  Achieved      OT LONG TERM GOAL #5   Title  Pt will be supervision for simple snack prep    Status  On-going      OT LONG TERM GOAL #6   Title  Pt and dtr will complete FOTO outcome scale    Status  On-going            Plan - 04/29/20 1049    Clinical Impression Statement  Pt with inconsistent follow through with HEP and splint wear. Reinforced with pt and in writing for dtr.    OT Occupational Profile and History  Detailed Assessment- Review of Records and additional review of physical, cognitive, psychosocial history related to current functional performance    Occupational performance deficits (Please refer to evaluation for details):  ADL's;IADL's;Work;Leisure;Social Participation    Body Structure  / Function / Physical Skills  ADL;Balance;Continence;Endurance;GMC;IADL;Mobility;Sensation;ROM;Pain;Strength;Tone;UE functional use    Rehab Potential  Good    Clinical Decision Making  Multiple treatment options, significant modification of task necessary    Comorbidities Affecting Occupational Performance:  Presence of comorbidities impacting occupational performance    Comorbidities impacting occupational performance description:  see above    Modification or Assistance to Complete Evaluation   Max significant modification of tasks or assist is necessary to complete    OT Frequency  2x / week    OT Duration  8 weeks   renewed on 04/29/2020   OT Treatment/Interventions  Self-care/ADL training;Aquatic Therapy;Electrical Stimulation;Ultrasound;Moist Heat;Therapeutic exercise;Neuromuscular education;Manual Therapy;Functional Mobility Training;DME and/or AE instruction;Passive range of motion;Patient/family education;Cognitive remediation/compensation;Therapeutic activities;Splinting;Balance training    Plan  NMR for LUE/trunk, postural alignment and control, balance and functional mobility,    Consulted and Agree with Plan of Care  Patient       Patient will benefit from skilled therapeutic intervention in order to improve the following deficits and impairments:   Body Structure / Function / Physical Skills: ADL, Balance, Continence, Endurance, GMC, IADL, Mobility, Sensation, ROM, Pain, Strength, Tone, UE functional use       Visit Diagnosis: Unsteadiness on feet - Plan: Ot plan of care cert/re-cert  Muscle weakness (generalized) - Plan: Ot plan of care cert/re-cert  Abnormal posture - Plan: Ot plan of care cert/re-cert  Other symptoms and signs involving the nervous system - Plan: Ot plan of care cert/re-cert  Other symptoms and signs involving cognitive functions following cerebral infarction - Plan: Ot plan of care cert/re-cert  Stiffness of left upper arm joint - Plan: Ot plan of  care cert/re-cert  Spastic hemiplegia of left nondominant side as late effect of cerebral infarction [facility]) - Plan: Ot plan of care cert/re-cert    Problem List Patient Active Problem List   Diagnosis Date Noted  . Cerebrovascular accident (CVA) due to thrombosis of precerebral artery ([facility]) 11/30/2019  . 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 Spastic hemiparesis affecting nondominant side ([facility]) 11/30/2019  . Chronic bilateral low back pain with sciatica 11/30/2019  . Metatarsal fracture 09/22/2019  . Depression 09/22/2019  . Asthma 09/22/2019  . Palliative care by specialist   . Goals of care, counseling/discussion   . Encephalopathy   . Acute CVA (cerebrovascular accident) ([facility]) 09/14/2019  . Acute arterial ischemic stroke, multifocal, anterior circulation, right ([facility]) 09/14/2019  . Severe comorbid illness   . AMS (altered mental status) 09/13/2019  . 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  . Hyperlipidemia 09/02/2017  . Hypertension 09/02/2017  . Chest pain 09/02/2017    Norton Pastel, OTR/L 04/29/2020, 10:59 AM  [facility] [address] [place], [place], [address] Phone: [phone]   Fax:  [phone]  Name: Christina Evans MRN: [id number] Date of Birth: October 02, 1961

## 2020-04-29 NOTE — Therapy (Signed)
[facility] [address] [place], [place], [address] Phone: [phone]   Fax:  [phone]  Physical Therapy Treatment  Patient Details  Name: Christina Evans MRN: [id number] Date of Birth: December 18, 1960 Referring Provider (PT): Garvin Fila, MD   Encounter Date: 04/29/2020  PT End of Session - 04/29/20 1222    Visit Number  17    Number of Visits  25    Date for PT Re-Evaluation  05/07/20   written for 60 day POC   Authorization Type  [employer] Medicare    PT Start Time  0932    PT Stop Time  1015    PT Time Calculation (min)  43 min    Equipment Utilized During Treatment  Gait belt    Activity Tolerance  Patient tolerated treatment well    Behavior During Therapy  WFL for tasks assessed/performed       Past Medical History:  Diagnosis Date   Asthma    CVA (cerebral vascular accident) ([place])    Diabetes mellitus without complication ([place])     Past Surgical History:  Procedure Laterality Date   IR ANGIO INTRA EXTRACRAN SEL COM CAROTID INNOMINATE BILAT MOD SED  09/15/2019   IR ANGIO VERTEBRAL SEL VERTEBRAL UNI L MOD SED  09/15/2019    There were no vitals filed for this visit.  Subjective Assessment - 04/29/20 0936    Subjective  Wasn't feeling too well this morning during OT session - reports her stomach was hurting because she didn't eat breakfast. Now it is feeling much better. Headache is better, BP was [facility] during OT session.    Patient is accompained by:  Family member   daughter [name]   Pertinent History  PMH significant for large right MCA CVA, HTN, T2DM, HLD, hx of Left foot fifth metatarsal fracture with osteopenia, asthma.    Limitations  Standing;Walking    How long can you stand comfortably?  1 minute (when daughter is changing her when she is wearing diapers).    How long can you walk comfortably?  a few feet.    Diagnostic tests  MRI brain with moderate sized acute to early subacute left  ACA territory infarct andlarge chronic right MCA territory infarct.    Patient Stated Goals  wants to get better, wants to be independent.    Currently in Pain?  No/denies                        [facility] Adult PT Treatment/Exercise - 04/29/20 0001      Transfers   Transfers  Sit to Stand;Stand to Sit    Sit to Stand  4: Min assist    Sit to Stand Details  Manual facilitation for placement;Manual facilitation for weight shifting;Verbal cues for technique;Verbal cues for sequencing;Tactile cues for weight shifting;Tactile cues for sequencing    Transfer Cueing  with use of yoga block between BLE to decr BLE ADD when standing, attempted sit to stands with therapist providing assist for weight shifting with no UE support for incr weight bearing through LLE, with pt stating "I can't do that", instead practiced with visual cue anteriorly with shifting weight forward over BLEs and gently lifting buttocks off mat table x10 reps     Therapeutic Activites    Therapeutic Activities  Other Therapeutic Activities    Other Therapeutic Activities  bed mobility: in supine, verbal and manual cues for hooklying position and cues to reach over  with RUE when rolling, in L sidelying cues for RUE placement to help push to sit at edge of mat, needing min A       Neuro Re-ed    Neuro Re-ed Details   In tall kneeling position on mat, with therapist providing min A for standing and turning and to help bring LLE onto mat table, with RUE providing support on kaye bunch, cues for tall kneeling crawling to get closer to bend and for assist with LLE, in tall kneeling position with verbal and tactile cues for pt to shift weight towards L and cues for hip extension, pt reaching with RUE towards tech's hand on L in multiple directions for incr weight shift over to L 2 x 10 reps with brief rest break in between, attempted tall kneeling mini squats for core and extensor activation with therapist on pt's L for incr  weight shift, cues for smaller range for incr BLE activation vs. using single RUE to help pull up from bench, however pt only able to perform 3 reps before fatiguing,  needing min A and cues on weight shifting towards R in order to get out of position        Exercises   Exercises  Other Exercises    Other Exercises   In R sidelying: therapist assisting with L hip flexor stretch 7 reps of 20-30 second holds. Then having pt assist with L hip flexion and extension with assist from therapist for proper alignment for L hip extension.                PT Short Term Goals - 04/17/20 1024      PT SHORT TERM GOAL #1   Title  Pt will undergo further assessment of L AFO for gait, mobility, and transfers. ALL STGS DUE 04/16/20    Baseline  pt with orthotic consult on 04/17/20    Time  4   due to delay in scheduling   Period  Weeks    Status  Achieved    Target Date  04/16/20      PT SHORT TERM GOAL #2   Title  Pt will perform stand step transfers with supervision in order to decr caregiver burder.    Baseline  stand step with min guard and cues for incr weight shift to LLE    Time  4    Period  Weeks    Status  Partially Met      PT SHORT TERM GOAL #3   Title  Pt will tolerate static standing for at least 5 minutes with no UE/single UE support with min guar in order to improve tolerance for ADLs.    Baseline  pt able to perform dynamic weight shifting and static standing at edge of mat with min guard/min A for balance and weight shift for 4-5 minutes    Time  4    Period  Weeks    Status  Partially Met      PT SHORT TERM GOAL #4   Title  Pt will ambulate at least 100' with RW, L AFO and min guard in order to improve functional mobility.    Baseline  ambulates 115' with quad base cane, L AFO and min guard/min A for weight shifting    Time  4    Period  Weeks    Status  Partially Met      PT SHORT TERM GOAL #5   Title  Pt will undergo assessment of gait speed  when appropriate.     Baseline  not yet assessed    Time  4    Period  Weeks    Status  Deferred        PT Long Term Goals - 02/07/20 1144      PT LONG TERM GOAL #1   Title  Pt will perform stand step transfers with no AD with mod I in order to decr caregiver burden. ALL LTGS DUE 05/08/20    Time  13    Period  Weeks    Status  New    Target Date  05/08/20      PT LONG TERM GOAL #2   Title  Pt will tolerate static standing for at least 5 minutes with single UE vs. no UE support in order to improve tolerance for ADLs.    Time  13    Period  Weeks    Status  New      PT LONG TERM GOAL #3   Title  Pt will ambulate at least 200' with hemiwalker vs. LRAD and supervision in order to improve functional houeshold mobility.    Time  13    Period  Weeks    Status  New      PT LONG TERM GOAL #4   Title  Pt will perform at least 5 sit <> stand transfers from mat table and standard height chair with mod I with no UE support vs. single UE support in order to demo improved functional transfers    Time  13    Period  Weeks    Status  New      PT LONG TERM GOAL #5   Title  Pt and pt's daughter will be independent with final HEP for weight bearing/lower extremity stretching/strengthening.    Time  13    Period  Weeks    Status  New            Plan - 04/29/20 1243    Clinical Impression Statement  Pt able to tolerate increased time in tall kneeling position today for incr weight shift to LLE, to decr tone, and for hip flexor stretch. However pt did fatigue though when attempting tall kneeling mini squats. Focused on multiple reps of anterior weight shifting prior to sit <> stand transfer with visual cue for weight shift, however pt unable to attempt sit <> stand without UE assist today even with max encouragement. Will continue to progress towards LTGs.    Personal Factors and Comorbidities  Comorbidity 3+;Past/Current Experience;Time since onset of injury/illness/exacerbation;Behavior Pattern     Comorbidities  PMH significant for large right MCA CVA, HTN, T2DM, HLD, hx of Left foot fifth metatarsal fracture with osteopenia, asthma.    Examination-Activity Limitations  Stand;Locomotion Level;Transfers;Toileting;Continence;Dressing    Examination-Participation Restrictions  Community Activity    Stability/Clinical Decision Making  Evolving/Moderate complexity    Rehab Potential  Good    PT Frequency  2x / week    PT Duration  12 weeks    PT Treatment/Interventions  ADLs/Self Care Home Management;Aquatic Therapy;Electrical Stimulation;DME Instruction;Gait training;Functional mobility training;Neuromuscular re-education;Balance training;Therapeutic exercise;Therapeutic activities;Patient/family education;Orthotic Fit/Training;Passive range of motion;Energy conservation    PT Next Visit Plan  pt has appt with [facility] next week for brace. tall kneeling? review exercises/stretches with daughter and update HEP as appropriate.  weight shifting towards L. gait training with RW (when appropriate with LUE spasticity) and hand hold assist, standing balance/weight shifting. anything in a mini squat position to break up  tone/spasticity.    Consulted and Agree with Plan of Care  Patient;Family member/caregiver       Patient will benefit from skilled therapeutic intervention in order to improve the following deficits and impairments:  Abnormal gait, Decreased activity tolerance, Decreased coordination, Decreased balance, Decreased cognition, Decreased endurance, Decreased knowledge of use of DME, Decreased range of motion, Decreased strength, Difficulty walking, Impaired tone, Impaired UE functional use, Postural dysfunction, Impaired sensation  Visit Diagnosis: Unsteadiness on feet  Muscle weakness (generalized)  Abnormal posture  Other symptoms and signs involving the nervous system     Problem List Patient Active Problem List   Diagnosis Date Noted   Cerebrovascular accident (CVA) due to  thrombosis of precerebral artery ([place]) 11/30/2019   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 11/30/2019   Spastic hemiparesis affecting nondominant side ([place]) 11/30/2019   Chronic bilateral low back pain with sciatica 11/30/2019   Metatarsal fracture 09/22/2019   Depression 09/22/2019   Asthma 09/22/2019   Palliative care by specialist    Goals of care, counseling/discussion    Encephalopathy    Acute CVA (cerebrovascular accident) ([place]) 09/14/2019   Acute arterial ischemic stroke, multifocal, anterior circulation, right ([place]) 09/14/2019   Severe comorbid illness    AMS (altered mental status) 09/13/2019   Controlled diabetes mellitus type 2 with complications ([place]) 15/50/2714   Hyperlipidemia 09/02/2017   Hypertension 09/02/2017   Chest pain 09/02/2017    Arliss Journey, PT, DPT  04/29/2020, 12:48 PM  [facility] [address] [place], [place], [address] Phone: [phone]   Fax:  [phone]  Name: Christina Evans MRN: [id number] Date of Birth: December 28, 1960

## 2020-04-30 ENCOUNTER — Encounter: Payer: Medicare Other | Admitting: Speech Pathology

## 2020-05-01 ENCOUNTER — Ambulatory Visit: Payer: Medicare Other | Admitting: Occupational Therapy

## 2020-05-01 ENCOUNTER — Other Ambulatory Visit: Payer: Self-pay

## 2020-05-01 ENCOUNTER — Ambulatory Visit: Payer: Medicare Other | Admitting: Physical Therapy

## 2020-05-01 ENCOUNTER — Encounter: Payer: Self-pay | Admitting: Occupational Therapy

## 2020-05-01 ENCOUNTER — Ambulatory Visit: Payer: Medicare Other

## 2020-05-01 DIAGNOSIS — R2681 Unsteadiness on feet: Secondary | ICD-10-CM

## 2020-05-01 DIAGNOSIS — R41841 Cognitive communication deficit: Secondary | ICD-10-CM | POA: Diagnosis not present

## 2020-05-01 DIAGNOSIS — I69318 Other symptoms and signs involving cognitive functions following cerebral infarction: Secondary | ICD-10-CM

## 2020-05-01 DIAGNOSIS — R29818 Other symptoms and signs involving the nervous system: Secondary | ICD-10-CM

## 2020-05-01 DIAGNOSIS — M6281 Muscle weakness (generalized): Secondary | ICD-10-CM

## 2020-05-01 DIAGNOSIS — R293 Abnormal posture: Secondary | ICD-10-CM

## 2020-05-01 DIAGNOSIS — I69354 Hemiplegia and hemiparesis following cerebral infarction affecting left non-dominant side: Secondary | ICD-10-CM

## 2020-05-01 DIAGNOSIS — M79602 Pain in left arm: Secondary | ICD-10-CM

## 2020-05-01 NOTE — Therapy (Signed)
[facility] [address] [place], [place], [address] Phone: [phone]   Fax:  [phone]  Speech Language Pathology Treatment  Patient Details  Name: Christina Evans MRN: [id number] Date of Birth: 11-07-1961 Referring Provider (SLP): Antony Contras, MD   Encounter Date: 05/01/2020  End of Session - 05/01/20 0926    Visit Number  6    Number of Visits  17    Date for SLP Re-Evaluation  06/19/20    SLP Start Time  0804    SLP Stop Time   0845    SLP Time Calculation (min)  41 min    Activity Tolerance  Patient tolerated treatment well       Past Medical History:  Diagnosis Date   Asthma    CVA (cerebral vascular accident) ([place])    Diabetes mellitus without complication ([place])     Past Surgical History:  Procedure Laterality Date   IR ANGIO INTRA EXTRACRAN SEL COM CAROTID INNOMINATE BILAT MOD SED  09/15/2019   IR ANGIO VERTEBRAL SEL VERTEBRAL UNI L MOD SED  09/15/2019    There were no vitals filed for this visit.         ADULT SLP TREATMENT - 05/01/20 0913      General Information   Behavior/Cognition  Distractible;Requires cueing;Decreased sustained attention      Cognitive-Linquistic Treatment   Treatment focused on  Cognition;Patient/family/caregiver education    Skilled Treatment  Sustained attention/selective attention targeted today using pt's therapy schedule including work on simple sequencing, memory about therapist names using associations, and about thearpist focus areas (e.g, walking vs. arm/hand) with SLP encourging pt use of associations. SLP used spaced retrieval for PT's name and OT's name and after 5th repetition pt told SLP both names with min phonemic cues. Part of difficlty here may be pt's ipremorbid diffculty with reading/writing English.SLP suggested pt dtr write down her schedule with pt each morning prior to coming to therapies and referring to it on the drive here.        Assessment / Recommendations / Plan   Plan  Continue with current plan of care      Progression Toward Goals   Progression toward goals  Progressing toward goals       SLP Education - 05/01/20 0926    Education Details  writing down therapy schedule each morning she has therapy    Person(s) Educated  Patient;Child(ren)    Methods  Explanation;Demonstration    Comprehension  Verbalized understanding       SLP Short Term Goals - 05/01/20 0927      SLP SHORT TERM GOAL #1   Title  pt will demonstrate attention appropriate to benefit from continued speech therapy over 4 sessions    Baseline  04-29-20, 05-01-20    Time  2    Period  Weeks   or 9 visits, for all STGs   Status  On-going      SLP SHORT TERM GOAL #2   Title  pt will demo selective attention to simple linguistic task for 90 seconds with occasionl min A x 3 sessions    Baseline  04/23/20, 04-29-20    Status  Achieved       SLP Long Term Goals - 05/01/20 0927      SLP LONG TERM GOAL #1   Title  pt will demo sustained/selective attention for a simple 5 minute linguistic task with rare min A over 3 sessions    Time  6    Period  Weeks   or 17 total sessions, for all LTGS   Status  On-going      SLP LONG TERM GOAL #2   Title  pt will demonstrate attention appropriate for continued ST over 8 sessions    Time  6    Period  Weeks    Status  On-going       Plan - 05/01/20 6160    Clinical Impression Statement  Pt presents with significant cognitive linguistic deficits stemming primarily from attentional deficits - pt appeared better able to hold a conversation today, with some assistance back to the topic due to circuitous and meandering explanation. She will cont to do well to do tasks at home which are familiar and functional such as counting the reps of her arm and leg exercises. Daughter not present for session today; given severity of pt's deficits daughter's presence likely necessary for any carryover at home. OT/PT  provided handout for daily needs (exercises and splint wear). Pt would cont to benefit from skilled ST targeting primarily attention. Pt may well benefit from attention med if not already prescribed. If an attention med would be initiated, re-evaluation using another cognitive insttrument may be necessary, and goals modified as needed.    Speech Therapy Frequency  2x / week    Duration  --   8 weeks, or 17 total sessions   Treatment/Interventions  Cognitive reorganization;Internal/external aids;Patient/family education;Compensatory strategies;SLP instruction and feedback;Cueing hierarchy;Functional tasks;Environmental controls;Multimodal communcation approach    Potential to Achieve Goals  Fair    Potential Considerations  Severity of impairments;Cooperation/participation level;Ability to learn/carryover information       Patient will benefit from skilled therapeutic intervention in order to improve the following deficits and impairments:   Cognitive communication deficit    Problem List Patient Active Problem List   Diagnosis Date Noted   Cerebrovascular accident (CVA) due to thrombosis of precerebral artery ([facility]) 11/30/2019   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 11/30/2019   Spastic hemiparesis affecting nondominant side ([facility]) 11/30/2019   Chronic bilateral low back pain with sciatica 11/30/2019   Metatarsal fracture 09/22/2019   Depression 09/22/2019   Asthma 09/22/2019   Palliative care by specialist    Goals of care, counseling/discussion    Encephalopathy    Acute CVA (cerebrovascular accident) ([facility]) 09/14/2019   Acute arterial ischemic stroke, multifocal, anterior circulation, right ([facility]) 09/14/2019   Severe comorbid illness    AMS (altered mental status) 09/13/2019   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   Hyperlipidemia 09/02/2017   Hypertension 09/02/2017   Chest pain 09/02/2017     Hisayo Delossantos ,MS, CCC-SLP  05/01/2020, 9:28 AM   [facility] [address] [place], [place], [address] Phone: [phone]   Fax:  [phone]   Name: Christina Evans MRN: [id number] Date of Birth: [birth year]

## 2020-05-01 NOTE — Therapy (Signed)
[facility] [address] [place], [place], [address] Phone: [phone]   Fax:  [phone]  Occupational Therapy Treatment  Patient Details  Name: Christina Evans MRN: [id number] Date of Birth: [birth year] Referring Provider (OT): Dr. Leonie Man   Encounter Date: 05/01/2020  OT End of Session - 05/01/20 0955    Visit Number  16    Number of Visits  31    Date for OT Re-Evaluation  06/24/20    Authorization Type  [employer] medicare and medicaid as secondary. Pt will need PN every 10th visit    Authorization Time Period  90 days - 07/22/2020 (renwewed on 04/29/2020)    Authorization - Visit Number  16    Authorization - Number of Visits  20    OT Start Time  0846    OT Stop Time  0930    OT Time Calculation (min)  44 min    Activity Tolerance  Patient tolerated treatment well       Past Medical History:  Diagnosis Date  . Asthma   . CVA (cerebral vascular accident) ([place])   . Diabetes mellitus without complication [facility])     Past Surgical History:  Procedure Laterality Date  . IR ANGIO INTRA EXTRACRAN SEL COM CAROTID INNOMINATE BILAT MOD SED  09/15/2019  . IR ANGIO VERTEBRAL SEL VERTEBRAL UNI L MOD SED  09/15/2019    There were no vitals filed for this visit.  Subjective Assessment - 05/01/20 0845    Subjective   This is hard - I get tired    Patient is accompanied by:  Family member   dtr   Pertinent History  Pt s/p recent R ACA CVA 09/13/19. 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    Currently in Pain?  No/denies                   OT Treatments/Exercises (OP) - 05/01/20 0001      Neurological Re-education Exercises   Other Exercises 1  Neuro re ed to address postural alignment wiht focus on hip  extension, L side active trunk elongation, improved scapular and L shoulder girdle aligment in supine, sidesitting on L elbow, sitting and standing.  Utilized manual hip stretch in supine followed by active facilitated bridging with holding, partial sit up toward the R side.  Transitioned into sidelying on L and then to propped on L elbow with reaching with RUE to faciliate scapular mobility, trunk rotation and elongation on L side, decreased spasiticity and scapular stabilization.  Progresesd to standing to address active hip extension, trunk activation, finding midline and lateral weight shifting initiated by pt. Progressd to high kneeling with intemittent RUE support to further activate hip extension, trunk alignment and balance. Fucntional ambulation with min a and moderate facilitation for weight shifting, postural alignment and orientation and step length.  Pt requires moderate facilitation for initiation of movement, sequencing of movement, direction and grading.                OT Short Term Goals - 05/01/20 0953      OT SHORT TERM GOAL #1   Title  Pt and dtr will be mod  I with HEP to address ROM for RUE - 03/20/2020    Status  Achieved  OT SHORT TERM GOAL #2   Title  Pt and dtr will verbalize understanding for AE for cutting food on plate    Status  Achieved      OT SHORT TERM GOAL #3   Title  Pt will be mod I with washing face and set up for bushing teeth.    Status  Achieved      OT SHORT TERM GOAL #4   Title  Pt will be mod a for UB dressing    Status  Achieved      OT SHORT TERM GOAL #5   Title  Pt will be mod a for LB dressing    Status  Achieved      OT SHORT TERM GOAL #6   Title  Pt will require mod a to hike pants    Status  Achieved      OT SHORT TERM GOAL #7   Title  Pt and dtr will verbalize understanding of toileting schedule to address incontinence    Status  Achieved      OT SHORT TERM GOAL #8   Title  Pt and dtr will be mod with splint wear and  care (resting hand splint at night)    Status  Achieved      OT SHORT TERM GOAL  #9   TITLE  Pt will demonstrate ability to set table and wipe table (with dishes/silverware set on table) with supervision - 05/27/2020    Status  On-going        OT Long Term Goals - 04/29/20 1049      OT LONG TERM GOAL #1   Title  Pt and dtr will be mod I with upgraded home activities program to include focus ADL's, transitional movements and balance. - 06/24/2020    Status  On-going      OT LONG TERM GOAL #2   Title  Pt will be supervision for toilet transfers    Status  Achieved      OT LONG TERM GOAL #3   Title  Pt will be no more than min a for hiking pants and toilet hygiene    Status  On-going      OT LONG TERM GOAL #4   Title  Pt will tolerate 110* of L shoulder flexion and 90* abduction during HEP for ease with ADL's with more than " a little " pain based on faces.    Status  Achieved      OT LONG TERM GOAL #5   Title  Pt will be supervision for simple snack prep    Status  On-going      OT LONG TERM GOAL #6   Title  Pt and dtr will complete FOTO outcome scale    Status  On-going            Plan - 05/01/20 0953    Clinical Impression Statement  Pt with slow but steady progress toward goals. Pt with improved sustained attention today    OT Occupational Profile and History  Detailed Assessment- Review of Records and additional review of physical, cognitive, psychosocial history related to current functional performance    Occupational performance deficits (Please refer to evaluation for details):  ADL's;IADL's;Work;Leisure;Social Participation    Body Structure / Function / Physical Skills  ADL;Balance;Continence;Endurance;GMC;IADL;Mobility;Sensation;ROM;Pain;Strength;Tone;UE functional use    Rehab Potential  Good    Clinical Decision Making  Multiple treatment options, significant modification of task necessary    Comorbidities Affecting Occupational Performance:  Presence of  comorbidities impacting occupational performance    Comorbidities impacting occupational performance description:  see above    Modification or Assistance to Complete Evaluation   Max significant modification of tasks or assist is necessary to complete    OT Frequency  2x / week    OT Duration  8 weeks    OT Treatment/Interventions  Self-care/ADL training;Aquatic Therapy;Electrical Stimulation;Ultrasound;Moist Heat;Therapeutic exercise;Neuromuscular education;Manual Therapy;Functional Mobility Training;DME and/or AE instruction;Passive range of motion;Patient/family education;Cognitive remediation/compensation;Therapeutic activities;Splinting;Balance training    Plan  NMR for LUE/trunk, postural alignment and control, balance and functional mobility,    Consulted and Agree with Plan of Care  Patient    Family Member Consulted  dtr       Patient will benefit from skilled therapeutic intervention in order to improve the following deficits and impairments:   Body Structure / Function / Physical Skills: ADL, Balance, Continence, Endurance, GMC, IADL, Mobility, Sensation, ROM, Pain, Strength, Tone, UE functional use       Visit Diagnosis: Unsteadiness on feet  Muscle weakness (generalized)  Abnormal posture  Other symptoms and signs involving the nervous system  Other symptoms and signs involving cognitive functions following cerebral infarction  Spastic hemiplegia of left nondominant side as late effect of cerebral infarction (HCC)  Pain in left arm    Problem List Patient Active Problem List   Diagnosis Date Noted  . Cerebrovascular accident (CVA) due to thrombosis of precerebral artery (HCC) 11/30/2019  . 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 Spastic hemiparesis affecting nondominant side (HCC) 11/30/2019  . Chronic bilateral low back pain with sciatica 11/30/2019  . Metatarsal fracture 09/22/2019  . Depression 09/22/2019   . Asthma 09/22/2019  . Palliative care by specialist   . Goals of care, counseling/discussion   . Encephalopathy   . Acute CVA (cerebrovascular accident) (HCC) 09/14/2019  . Acute arterial ischemic stroke, multifocal, anterior circulation, right (HCC) 09/14/2019  . Severe comorbid illness   . AMS (altered mental status) 09/13/2019  . 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  . Hyperlipidemia 09/02/2017  . Hypertension 09/02/2017  . Chest pain 09/02/2017    Norton Pastel, OTR/L 05/01/2020, 9:57 AM  [facility] [address] [place], [place], [address] Phone: [phone]   Fax:  [phone]  Name: Christina Evans MRN: [id number] Date of Birth: [birth year]

## 2020-05-02 ENCOUNTER — Encounter: Payer: Medicare Other | Admitting: Speech Pathology

## 2020-05-02 NOTE — Therapy (Addendum)
[facility] [address] [place], [place], [address] Phone: [phone]   Fax:  [phone]  Physical Therapy Treatment  Patient Details  Name: Christina Evans MRN: [id number] Date of Birth: 16-Jul-1961 Referring Provider (PT): Garvin Fila, MD   Encounter Date: 05/01/2020   PT End of Session - 05/02/20 1557    Visit Number 18    Number of Visits 25    Date for PT Re-Evaluation 05/07/20   written for 60 day POC   Authorization Type [employer] Medicare    PT Start Time 0935    PT Stop Time 1017    PT Time Calculation (min) 42 min    Equipment Utilized During Treatment Gait belt    Activity Tolerance Patient tolerated treatment well    Behavior During Therapy [facility] for tasks assessed/performed           Past Medical History:  Diagnosis Date  . Asthma   . CVA (cerebral vascular accident) ([place])   . Diabetes mellitus without complication [facility])     Past Surgical History:  Procedure Laterality Date  . IR ANGIO INTRA EXTRACRAN SEL COM CAROTID INNOMINATE BILAT MOD SED  09/15/2019  . IR ANGIO VERTEBRAL SEL VERTEBRAL UNI L MOD SED  09/15/2019    There were no vitals filed for this visit.                      [facility] Adult PT Treatment/Exercise - 05/02/20 0001      Transfers   Transfers Sit to Stand;Stand to Sit    Sit to Stand 4: Min assist    Sit to Stand Details Manual facilitation for placement;Manual facilitation for weight shifting;Verbal cues for technique;Verbal cues for sequencing;Tactile cues for weight shifting;Tactile cues for sequencing    Sit to Stand Details (indicate cue type and reason) with pt's RUE on therapist's shoulder, therapist providing facilitation at head to promote more flexion movement patterns and to assist with forward weight shift    Transfer Cueing Stand step transfers and turning to mat after bouts of gait, continued cues for incr step length with RLE for incr weight shift  towards LLE, min A for balance and weight shift      Ambulation/Gait   Gait Comments Gait training with no AD, with pt placing RUE onto therapist's shoulder, performed 6 bouts of approx. 53' each, with therapist facilitation for weight shifting over to LUE and verbal/visual cues for incr step length with RLE. Min A for balance and postural alignment. Tactile and verbal cues for hip ext activation on LLE for incr upright posture.        Neuro Re-ed    Neuro Re-ed Details  Standing lateral weight shifting with therapist's foot between pt's for wider BOS, verbal and tactile cues for L hip extensor activation                   PT Education - 05/02/20 2000    Education Details reviewed 2 most important exercises to be performing at home - supine hip flexor stretch and bridging    Person(s) Educated Patient;Child(ren)    Methods Explanation;Demonstration    Comprehension Verbalized understanding            PT Short Term Goals - 04/17/20 1024      PT SHORT TERM GOAL #1   Title Pt will undergo further assessment of L AFO for gait, mobility, and transfers. ALL STGS DUE 04/16/20  Baseline pt with orthotic consult on 04/17/20    Time 4   due to delay in scheduling   Period Weeks    Status Achieved    Target Date 04/16/20      PT SHORT TERM GOAL #2   Title Pt will perform stand step transfers with supervision in order to decr caregiver burder.    Baseline stand step with min guard and cues for incr weight shift to LLE    Time 4    Period Weeks    Status Partially Met      PT SHORT TERM GOAL #3   Title Pt will tolerate static standing for at least 5 minutes with no UE/single UE support with min guar in order to improve tolerance for ADLs.    Baseline pt able to perform dynamic weight shifting and static standing at edge of mat with min guard/min A for balance and weight shift for 4-5 minutes    Time 4    Period Weeks    Status Partially Met      PT SHORT TERM GOAL #4   Title  Pt will ambulate at least 100' with RW, L AFO and min guard in order to improve functional mobility.    Baseline ambulates 115' with quad base cane, L AFO and min guard/min A for weight shifting    Time 4    Period Weeks    Status Partially Met      PT SHORT TERM GOAL #5   Title Pt will undergo assessment of gait speed when appropriate.    Baseline not yet assessed    Time 4    Period Weeks    Status Deferred             PT Long Term Goals - 02/07/20 1144      PT LONG TERM GOAL #1   Title Pt will perform stand step transfers with no AD with mod I in order to decr caregiver burden. ALL LTGS DUE 05/08/20    Time 13    Period Weeks    Status New    Target Date 05/08/20      PT LONG TERM GOAL #2   Title Pt will tolerate static standing for at least 5 minutes with single UE vs. no UE support in order to improve tolerance for ADLs.    Time 13    Period Weeks    Status New      PT LONG TERM GOAL #3   Title Pt will ambulate at least 200' with hemiwalker vs. LRAD and supervision in order to improve functional houeshold mobility.    Time 13    Period Weeks    Status New      PT LONG TERM GOAL #4   Title Pt will perform at least 5 sit <> stand transfers from mat table and standard height chair with mod I with no UE support vs. single UE support in order to demo improved functional transfers    Time 13    Period Weeks    Status New      PT LONG TERM GOAL #5   Title Pt and pt's daughter will be independent with final HEP for weight bearing/lower extremity stretching/strengthening.    Time 13    Period Weeks    Status New                 Plan - 05/02/20 2003    Clinical Impression Statement Pt was casted for  her L AFO yesterday and will be receiving in 2 weeks. Today's session focused on gait training with no AD and facilitation from therapist for incr weight shift towards LLE and hip extensor activation for incr step length with RLE. Will continue to progress towards  LTGs.    Personal Factors and Comorbidities Comorbidity 3+;Past/Current Experience;Time since onset of injury/illness/exacerbation;Behavior Pattern    Comorbidities PMH significant for large right MCA CVA, HTN, T2DM, HLD, hx of Left foot fifth metatarsal fracture with osteopenia, asthma.    Examination-Activity Limitations Stand;Locomotion Level;Transfers;Toileting;Continence;Dressing    Examination-Participation Restrictions Community Activity    Stability/Clinical Decision Making Evolving/Moderate complexity    Rehab Potential Good    PT Frequency 2x / week    PT Duration 12 weeks    PT Treatment/Interventions ADLs/Self Care Home Management;Aquatic Therapy;Electrical Stimulation;DME Instruction;Gait training;Functional mobility training;Neuromuscular re-education;Balance training;Therapeutic exercise;Therapeutic activities;Patient/family education;Orthotic Fit/Training;Passive range of motion;Energy conservation    PT Next Visit Plan review exercises/stretches with daughter and update HEP as appropriate.  weight shifting towards L. gait training with RW (when appropriate with LUE spasticity) and hand hold assist, standing balance/weight shifting. anything in a mini squat position to break up tone/spasticity.    Consulted and Agree with Plan of Care Patient;Family member/caregiver           Patient will benefit from skilled therapeutic intervention in order to improve the following deficits and impairments:  Abnormal gait, Decreased activity tolerance, Decreased coordination, Decreased balance, Decreased cognition, Decreased endurance, Decreased knowledge of use of DME, Decreased range of motion, Decreased strength, Difficulty walking, Impaired tone, Impaired UE functional use, Postural dysfunction, Impaired sensation  Visit Diagnosis: Muscle weakness (generalized)  Unsteadiness on feet  Abnormal posture  Other symptoms and signs involving the nervous system     Problem List Patient  Active Problem List   Diagnosis Date Noted  . Cerebrovascular accident (CVA) due to thrombosis of precerebral artery ([place]) 11/30/2019  . 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 Spastic hemiparesis affecting nondominant side ([place]) 11/30/2019  . Chronic bilateral low back pain with sciatica 11/30/2019  . Metatarsal fracture 09/22/2019  . Depression 09/22/2019  . Asthma 09/22/2019  . Palliative care by specialist   . Goals of care, counseling/discussion   . Encephalopathy   . Acute CVA (cerebrovascular accident) ([place]) 09/14/2019  . Acute arterial ischemic stroke, multifocal, anterior circulation, right ([place]) 09/14/2019  . Severe comorbid illness   . AMS (altered mental status) 09/13/2019  . Controlled diabetes mellitus type 2 with complications ([place]) 26/83/4196  . Hyperlipidemia 09/02/2017  . Hypertension 09/02/2017  . Chest pain 09/02/2017    Arliss Journey, PT, DPT  05/02/2020, 8:04 PM  [facility] [address] [place], [place], [address] Phone: [phone]   Fax:  [phone]  Name: Jovan Schickling MRN: [id number] Date of Birth: 13-Sep-1961

## 2020-05-06 ENCOUNTER — Ambulatory Visit: Payer: Medicare Other

## 2020-05-06 ENCOUNTER — Encounter: Payer: Self-pay | Admitting: Physical Therapy

## 2020-05-06 ENCOUNTER — Ambulatory Visit: Payer: Medicare Other | Admitting: Occupational Therapy

## 2020-05-06 ENCOUNTER — Encounter: Payer: Self-pay | Admitting: Occupational Therapy

## 2020-05-06 ENCOUNTER — Other Ambulatory Visit: Payer: Self-pay

## 2020-05-06 ENCOUNTER — Ambulatory Visit: Payer: Medicare Other | Admitting: Physical Therapy

## 2020-05-06 VITALS — BP 125/71 | HR 88

## 2020-05-06 DIAGNOSIS — M25622 Stiffness of left elbow, not elsewhere classified: Secondary | ICD-10-CM

## 2020-05-06 DIAGNOSIS — R29818 Other symptoms and signs involving the nervous system: Secondary | ICD-10-CM

## 2020-05-06 DIAGNOSIS — R2681 Unsteadiness on feet: Secondary | ICD-10-CM

## 2020-05-06 DIAGNOSIS — M79602 Pain in left arm: Secondary | ICD-10-CM

## 2020-05-06 DIAGNOSIS — R293 Abnormal posture: Secondary | ICD-10-CM

## 2020-05-06 DIAGNOSIS — M6281 Muscle weakness (generalized): Secondary | ICD-10-CM

## 2020-05-06 DIAGNOSIS — R41841 Cognitive communication deficit: Secondary | ICD-10-CM

## 2020-05-06 DIAGNOSIS — I69354 Hemiplegia and hemiparesis following cerebral infarction affecting left non-dominant side: Secondary | ICD-10-CM

## 2020-05-06 DIAGNOSIS — I69318 Other symptoms and signs involving cognitive functions following cerebral infarction: Secondary | ICD-10-CM

## 2020-05-06 NOTE — Therapy (Signed)
[facility] [address] [place], [place], [address] Phone: [phone]   Fax:  [phone]  Occupational Therapy Treatment  Patient Details  Name: Christina Evans MRN: [id number] Date of Birth: 1961-08-29 Referring Provider (OT): Dr. Pearlean Brownie   Encounter Date: 05/06/2020   OT End of Session - 05/06/20 1216    Visit Number 17    Number of Visits 31    Date for OT Re-Evaluation 06/24/20    Authorization Type [employer] medicare and medicaid as secondary. Pt will need PN every 10th visit    Authorization Time Period 90 days - 07/22/2020 (renwewed on 04/29/2020)    Authorization - Visit Number 17    Authorization - Number of Visits 20    OT Start Time 0848    OT Stop Time 0930    OT Time Calculation (min) 42 min    Activity Tolerance Patient tolerated treatment well           Past Medical History:  Diagnosis Date  . Asthma   . CVA (cerebral vascular accident) (HCC)   . Diabetes mellitus without complication [facility])     Past Surgical History:  Procedure Laterality Date  . IR ANGIO INTRA EXTRACRAN SEL COM CAROTID INNOMINATE BILAT MOD SED  09/15/2019  . IR ANGIO VERTEBRAL SEL VERTEBRAL UNI L MOD SED  09/15/2019    Vitals:   05/06/20 0852  BP: 125/71  Pulse: 88     Subjective Assessment - 05/06/20 0852    Subjective  I didn't eat this morning I never eat in the morning.    Pertinent History Pt s/p recent R ACA CVA 09/13/19. 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    Currently in Pain? Yes    Pain Score 9     Pain Location Head    Pain Orientation Other (Comment)   frontal headache   Pain Descriptors / Indicators Sharp    Pain Type Acute pain    Pain Onset Today    Pain Frequency Constant    Aggravating  Factors  I just woke up with it.    Pain Relieving Factors i didn't take anyting for it                        OT Treatments/Exercises (OP) - 05/06/20 0001      Neurological Re-education Exercises   Other Exercises 1 Neuro re ed in sitting, standing and functional ambulation to address postural alignment and control, lateral and lateral anterior weight shifting with increased activity of LLE and L side of trunk.  Also addresed lateral weight shifting to L with forced paradaigm via stepping with RLE. Pt needs mod- max facilitaiton and max cues.  Also addressed functional ambulation with emphasis on postural alignment and control, increased activity and stance time on LLE with alignment, and step length. Pt reported headache today and was much more distracted, even in quiet separate room.                      OT Short Term Goals - 05/01/20 0953      OT SHORT TERM GOAL #1   Title Pt and dtr will be mod  I with HEP to address ROM for RUE - 03/20/2020  Status Achieved      OT SHORT TERM GOAL #2   Title Pt and dtr will verbalize understanding for AE for cutting food on plate    Status Achieved      OT SHORT TERM GOAL #3   Title Pt will be mod I with washing face and set up for bushing teeth.    Status Achieved      OT SHORT TERM GOAL #4   Title Pt will be mod a for UB dressing    Status Achieved      OT SHORT TERM GOAL #5   Title Pt will be mod a for LB dressing    Status Achieved      OT SHORT TERM GOAL #6   Title Pt will require mod a to hike pants    Status Achieved      OT SHORT TERM GOAL #7   Title Pt and dtr will verbalize understanding of toileting schedule to address incontinence    Status Achieved      OT SHORT TERM GOAL #8   Title Pt and dtr will be mod with splint wear and care (resting hand splint at night)    Status Achieved      OT SHORT TERM GOAL  #9   TITLE Pt will demonstrate ability to set table and wipe table (with dishes/silverware  set on table) with supervision - 05/27/2020    Status On-going             OT Long Term Goals - 04/29/20 1049      OT LONG TERM GOAL #1   Title Pt and dtr will be mod I with upgraded home activities program to include focus ADL's, transitional movements and balance. - 06/24/2020    Status On-going      OT LONG TERM GOAL #2   Title Pt will be supervision for toilet transfers    Status Achieved      OT LONG TERM GOAL #3   Title Pt will be no more than min a for hiking pants and toilet hygiene    Status On-going      OT LONG TERM GOAL #4   Title Pt will tolerate 110* of L shoulder flexion and 90* abduction during HEP for ease with ADL's with more than " a little " pain based on faces.    Status Achieved      OT LONG TERM GOAL #5   Title Pt will be supervision for simple snack prep    Status On-going      OT LONG TERM GOAL #6   Title Pt and dtr will complete FOTO outcome scale    Status On-going                 Plan - 05/06/20 1215    Clinical Impression Statement Pt with very slow progress toward goals however pt did demonstrate improved postural alignment at beginnng  of session today. Pt was very distractble today.    OT Occupational Profile and History Detailed Assessment- Review of Records and additional review of physical, cognitive, psychosocial history related to current functional performance    Occupational performance deficits (Please refer to evaluation for details): ADL's;IADL's;Work;Leisure;Social Participation    Body Structure / Function / Physical Skills ADL;Balance;Continence;Endurance;GMC;IADL;Mobility;Sensation;ROM;Pain;Strength;Tone;UE functional use    Clinical Decision Making Multiple treatment options, significant modification of task necessary    Comorbidities Affecting Occupational Performance: Presence of comorbidities impacting occupational performance    Comorbidities impacting occupational performance description: see  above    Modification or  Assistance to Complete Evaluation  Max significant modification of tasks or assist is necessary to complete    OT Frequency 2x / week    OT Duration 8 weeks    OT Treatment/Interventions Self-care/ADL training;Aquatic Therapy;Electrical Stimulation;Ultrasound;Moist Heat;Therapeutic exercise;Neuromuscular education;Manual Therapy;Functional Mobility Training;DME and/or AE instruction;Passive range of motion;Patient/family education;Cognitive remediation/compensation;Therapeutic activities;Splinting;Balance training    Plan NMR for LUE/trunk, postural alignment and control, balance and functional mobility,    Consulted and Agree with Plan of Care Patient           Patient will benefit from skilled therapeutic intervention in order to improve the following deficits and impairments:   Body Structure / Function / Physical Skills: ADL, Balance, Continence, Endurance, GMC, IADL, Mobility, Sensation, ROM, Pain, Strength, Tone, UE functional use       Visit Diagnosis: Muscle weakness (generalized)  Unsteadiness on feet  Abnormal posture  Other symptoms and signs involving the nervous system  Other symptoms and signs involving cognitive functions following cerebral infarction  Spastic hemiplegia of left nondominant side as late effect of cerebral infarction (HCC)  Pain in left arm  Stiffness of left upper arm joint    Problem List Patient Active Problem List   Diagnosis Date Noted  . Cerebrovascular accident (CVA) due to thrombosis of precerebral artery ([place]) 11/30/2019  . 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 Spastic hemiparesis affecting nondominant side ([place]) 11/30/2019  . Chronic bilateral low back pain with sciatica 11/30/2019  . Metatarsal fracture 09/22/2019  . Depression 09/22/2019  . Asthma 09/22/2019  . Palliative care by specialist   . Goals of care, counseling/discussion   . Encephalopathy   . Acute CVA  (cerebrovascular accident) ([place]) 09/14/2019  . Acute arterial ischemic stroke, multifocal, anterior circulation, right ([place]) 09/14/2019  . Severe comorbid illness   . AMS (altered mental status) 09/13/2019  . Controlled diabetes mellitus type 2 with complications ([place]) 84/16/6063  . Hyperlipidemia 09/02/2017  . Hypertension 09/02/2017  . Chest pain 09/02/2017    Quay Burow , OTR/L 05/06/2020, 12:18 PM  [facility] [address] [place], [place], [address] Phone: [phone]   Fax:  [phone]  Name: Christina Evans MRN: [id number] Date of Birth: [birth year]

## 2020-05-06 NOTE — Patient Instructions (Signed)
  Please complete the assigned speech therapy homework prior to your next session and return it to the speech therapist at your next visit.  

## 2020-05-06 NOTE — Therapy (Signed)
[facility] [address] [place], [place], [address] Phone: [phone]   Fax:  [phone]  Speech Language Pathology Treatment  Patient Details  Name: Christina Evans MRN: [id number] Date of Birth: 05-07-1961 Referring Provider (SLP): Delia Heady, MD   Encounter Date: 05/06/2020   End of Session - 05/06/20 1205    Visit Number 7    Number of Visits 17    Date for SLP Re-Evaluation 06/19/20    SLP Start Time 0806    SLP Stop Time  0846    SLP Time Calculation (min) 40 min    Activity Tolerance Patient tolerated treatment well           Past Medical History:  Diagnosis Date  . Asthma   . CVA (cerebral vascular accident) (HCC)   . Diabetes mellitus without complication [facility])     Past Surgical History:  Procedure Laterality Date  . IR ANGIO INTRA EXTRACRAN SEL COM CAROTID INNOMINATE BILAT MOD SED  09/15/2019  . IR ANGIO VERTEBRAL SEL VERTEBRAL UNI L MOD SED  09/15/2019    There were no vitals filed for this visit.   Subjective Assessment - 05/06/20 0810    Subjective "We did it yesterday, and Saturday." (leg and arm exercises) "I said alright, let's go for it."    Currently in Pain? No/denies                 ADULT SLP TREATMENT - 05/06/20 0811      General Information   Behavior/Cognition Cooperative;Distractible;Decreased sustained attention;Pleasant mood      Cognitive-Linquistic Treatment   Treatment focused on Cognition;Patient/family/caregiver education    Skilled Treatment "She's going to go pay some bills." (pt, re: daughter). SLP worked on pt's sustained and selective attention today- SLP turned pt all the way around with back to window yet she turned around x5 during the session to look outside. SLP provided pt with 4 items and pt had to tell Odd Man Out. Pt req'd mod-max cues for attention to recall 4 words, consistently. "I was going to say 'apple' in my mind but I forgot."  (re:  stimuli of carrot, apple, corn, brocolli). Due to pt's illiteracy with both Spanish and [place], cueing means are somewhat limited - SLP used "LISTEN" for a cue for pt's focus. SLP reviewed pt's schedule for tx today with her x7 and pt cont to require cues for memory - the last two times pt looked at her sheet that SLP assisted her to write.  SLP provided pt homework to work with [employer].       Assessment / Recommendations / Plan   Plan Continue with current plan of care      Progression Toward Goals   Progression toward goals Not progressing toward goals (comment)   severity of deficit             SLP Short Term Goals - 05/06/20 1207      SLP SHORT TERM GOAL #1   Title pt will demonstrate attention appropriate to benefit from continued speech therapy over 4 sessions    Baseline 04-29-20, 05-01-20    Time 1    Period Weeks   or 9 visits, for all STGs   Status On-going      SLP SHORT TERM GOAL #2   Title pt will demo selective attention to simple linguistic task for 90 seconds with occasionl min A x 3 sessions    Baseline 04/23/20, 04-29-20  Status Achieved            SLP Long Term Goals - 05/06/20 1207      SLP LONG TERM GOAL #1   Title pt will demo sustained/selective attention for a simple 5 minute linguistic task with rare min A over 3 sessions    Time 5    Period Weeks   or 17 total sessions, for all LTGS   Status On-going      SLP LONG TERM GOAL #2   Title pt will demonstrate attention appropriate for continued ST over 8 sessions    Baseline 05-01-20, 05-06-20    Time 5    Period Weeks    Status On-going            Plan - 05/06/20 1206    Clinical Impression Statement Pt presents with significant cognitive linguistic deficits stemming primarily from attentional deficits - ability to stay on topic was not as good as previous session --pt with circuitous and meandering topics warranting cues back to topic. She will cont to do well to do tasks at home which are familiar  and functional such as counting the reps of her arm and leg exercises. Daughter not present for session today; given severity of pt's deficits daughter's presence likely necessary for any carryover at home. OT/PT provided handout for daily needs (exercises and splint wear). Pt would cont to benefit from skilled ST targeting primarily attention. Pt may well benefit from attention med if not already prescribed. If an attention med would be initiated, re-evaluation using another cognitive insttrument may be necessary, and goals modified as needed.    Speech Therapy Frequency 2x / week    Duration --   8 weeks, or 17 total sessions   Treatment/Interventions Cognitive reorganization;Internal/external aids;Patient/family education;Compensatory strategies;SLP instruction and feedback;Cueing hierarchy;Functional tasks;Environmental controls;Multimodal communcation approach    Potential to Achieve Goals Fair    Potential Considerations Severity of impairments;Cooperation/participation level;Ability to learn/carryover information           Patient will benefit from skilled therapeutic intervention in order to improve the following deficits and impairments:   Cognitive communication deficit    Problem List Patient Active Problem List   Diagnosis Date Noted  . Cerebrovascular accident (CVA) due to thrombosis of precerebral artery ([place]) 11/30/2019  . 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 Spastic hemiparesis affecting nondominant side ([place]) 11/30/2019  . Chronic bilateral low back pain with sciatica 11/30/2019  . Metatarsal fracture 09/22/2019  . Depression 09/22/2019  . Asthma 09/22/2019  . Palliative care by specialist   . Goals of care, counseling/discussion   . Encephalopathy   . Acute CVA (cerebrovascular accident) ([place]) 09/14/2019  . Acute arterial ischemic stroke, multifocal, anterior circulation, right ([place]) 09/14/2019  . Severe comorbid  illness   . AMS (altered mental status) 09/13/2019  . Controlled diabetes mellitus type 2 with complications ([place]) 34/19/3790  . Hyperlipidemia 09/02/2017  . Hypertension 09/02/2017  . Chest pain 09/02/2017    [facility] ,[place], [place]  05/06/2020, 12:08 PM  [facility] [address] [place], [place], [address] Phone: [phone]   Fax:  [phone]   Name: Christina Evans MRN: [id number] Date of Birth: Apr 12, 1961

## 2020-05-07 ENCOUNTER — Encounter: Payer: Medicare Other | Attending: Physical Medicine & Rehabilitation | Admitting: Physical Medicine & Rehabilitation

## 2020-05-07 ENCOUNTER — Encounter: Payer: Self-pay | Admitting: Physical Medicine & Rehabilitation

## 2020-05-07 VITALS — BP 119/72 | HR 96 | Temp 97.3°F | Ht 61.0 in | Wt 164.8 lb

## 2020-05-07 DIAGNOSIS — G8110 Spastic hemiplegia affecting unspecified side: Secondary | ICD-10-CM | POA: Diagnosis present

## 2020-05-07 DIAGNOSIS — Z794 Long term (current) use of insulin: Secondary | ICD-10-CM

## 2020-05-07 DIAGNOSIS — I69319 Unspecified symptoms and signs involving cognitive functions following cerebral infarction: Secondary | ICD-10-CM | POA: Diagnosis present

## 2020-05-07 DIAGNOSIS — E118 Type 2 diabetes mellitus with unspecified complications: Secondary | ICD-10-CM

## 2020-05-07 DIAGNOSIS — Z5181 Encounter for therapeutic drug level monitoring: Secondary | ICD-10-CM | POA: Insufficient documentation

## 2020-05-07 DIAGNOSIS — Z79899 Other long term (current) drug therapy: Secondary | ICD-10-CM | POA: Diagnosis present

## 2020-05-07 MED ORDER — METHYLPHENIDATE HCL 5 MG PO TABS: 5.0000 mg | ORAL_TABLET | Freq: Every morning | ORAL | 0 refills | Status: DC

## 2020-05-07 MED FILL — tiZANidine HCL 4 MG TABS: 4 | 30 days supply | Qty: 30 | Fill #1

## 2020-05-07 MED FILL — METHYLPHENIDATE 5 MG TABLET: 5 | 30 days supply | Qty: 30 | Fill #0

## 2020-05-07 MED FILL — CLOPIDOGREL 75 MG TABLET: 75 | 90 days supply | Qty: 90 | Fill #2

## 2020-05-07 NOTE — Patient Instructions (Signed)

## 2020-05-07 NOTE — Therapy (Signed)
[facility] [address] [place], [place], [address] Phone: [phone]   Fax:  [phone]  Physical Therapy Treatment/Re-Cert/10th Visit Progress Note  Patient Details  Name: Christina Evans MRN: [id number] Date of Birth: June 28, 1961 Referring Provider (PT): Garvin Fila, MD  10th Visit Physical Therapy Progress Note  Dates of Reporting Period: 04/08/20 to 05/06/20    Encounter Date: 05/06/2020   PT End of Session - 05/06/20 1214    Visit Number 19    Number of Visits 35    Date for PT Re-Evaluation 08/05/20   written for 60 day POC   Authorization Type [employer] Medicare    PT Start Time 0935    PT Stop Time 1015    PT Time Calculation (min) 40 min    Activity Tolerance Patient tolerated treatment well    Behavior During Therapy [facility] for tasks assessed/performed           Past Medical History:  Diagnosis Date   Asthma    CVA (cerebral vascular accident) ([place])    Diabetes mellitus without complication ([place])     Past Surgical History:  Procedure Laterality Date   IR ANGIO INTRA EXTRACRAN SEL COM CAROTID INNOMINATE BILAT MOD SED  09/15/2019   IR ANGIO VERTEBRAL SEL VERTEBRAL UNI L MOD SED  09/15/2019    There were no vitals filed for this visit.   Subjective Assessment - 05/06/20 0937    Subjective Headache is gone - had one earlier today. Was working on the bridging and exercises with her daughter this weekend.    Patient is accompained by: Family member   daughter Christina Evans   Pertinent History PMH significant for large right MCA CVA, HTN, T2DM, HLD, hx of Left foot fifth metatarsal fracture with osteopenia, asthma.    Limitations Standing;Walking    How long can you stand comfortably? 1 minute (when daughter is changing her when she is wearing diapers).    How long can you walk comfortably? a few feet.    Diagnostic tests MRI brain with moderate sized acute to early subacute left ACA territory  infarct andlarge chronic right MCA territory infarct.    Patient Stated Goals wants to get better, wants to be independent.    Currently in Pain? No/denies                             [facility] Adult PT Treatment/Exercise - 05/07/20 0001      Transfers   Transfers Sit to Stand;Stand to Sit    Sit to Stand 5: Supervision    Sit to Stand Details (indicate cue type and reason) pt standing with use of RUE from chair    Stand to Sit 4: Min guard;4: Min assist    Stand to Sit Details (indicate cue type and reason) Verbal cues for sequencing;Verbal cues for technique;Tactile cues for posture;Tactile cues for weight shifting;Manual facilitation for weight shifting    Stand to Sit Details without UE support, therapist assisting pt maintaining weight shifting to L before sitting down    Transfer Cueing with pt's RUE on therapist's arm performed x3 reps throughout session with facilitation and cues for weight shifting to L and taking a bigger step with RLE      Ambulation/Gait   Ambulation/Gait Yes    Ambulation/Gait Assistance Details pt more easily distractable today - performed in a quiet hallway, performing approx. 4x 60' bouts of gait with therapist providing  manual facilitation for weight shifting over to LLE and L hip extension with max cues for pt to take longer steps with RLE, pt with improved RLE step length with facilitation for R hip ext     Assistive device None    Gait Pattern Step-through pattern;Decreased stance time - left;Decreased hip/knee flexion - left;Decreased weight shift to left;Decreased dorsiflexion - left;Left circumduction;Left hip hike;Narrow base of support;Poor foot clearance - left    Ambulation Surface Indoor;Level      Neuro Re-ed    Neuro Re-ed Details  At edge of mat table with therapist to pt's left performed lateral weight shifting to L, with mod/max facilitation from therapist and verbal cues to shift to L, progressing to adding big stepping forward  and backwards with RLE while maintaining weight shift to L - 2 x 10 reps. Performed an additional 10 reps of forward stepping over yard stick with RLE for improved foot clearance/step length, pt needing initial demo cues to perform.                   PT Education - 05/06/20 1214    Education Details educated pt's daughter at end of session to make sure pt eats before coming to therapy in the morning (pt with headache earlier due to not eating this morning, gave pt crackers at start of OT session).    Person(s) Educated Patient;Child(ren)    Methods Explanation    Comprehension Verbalized understanding            PT Short Term Goals - 04/17/20 1024      PT SHORT TERM GOAL #1   Title Pt will undergo further assessment of L AFO for gait, mobility, and transfers. ALL STGS DUE 04/16/20    Baseline pt with orthotic consult on 04/17/20    Time 4   due to delay in scheduling   Period Weeks    Status Achieved    Target Date 04/16/20      PT SHORT TERM GOAL #2   Title Pt will perform stand step transfers with supervision in order to decr caregiver burder.    Baseline stand step with min guard and cues for incr weight shift to LLE    Time 4    Period Weeks    Status Partially Met      PT SHORT TERM GOAL #3   Title Pt will tolerate static standing for at least 5 minutes with no UE/single UE support with min guar in order to improve tolerance for ADLs.    Baseline pt able to perform dynamic weight shifting and static standing at edge of mat with min guard/min A for balance and weight shift for 4-5 minutes    Time 4    Period Weeks    Status Partially Met      PT SHORT TERM GOAL #4   Title Pt will ambulate at least 100' with RW, L AFO and min guard in order to improve functional mobility.    Baseline ambulates 115' with quad base cane, L AFO and min guard/min A for weight shifting    Time 4    Period Weeks    Status Partially Met      PT SHORT TERM GOAL #5   Title Pt will  undergo assessment of gait speed when appropriate.    Baseline not yet assessed    Time 4    Period Weeks    Status Deferred  Revised STGs for re-cert:    PT Short Term Goals - 05/07/20 1428      PT SHORT TERM GOAL #1   Title Pt will receive L AFO for functional gait, mobility, and transfers. ALL STGS DUE 06/04/20    Time 4    Period Weeks    Status New    Target Date 06/04/20      PT SHORT TERM GOAL #2   Title Pt will perform stand step transfers with min guard consistently in order to decr caregiver burder.    Time 4    Period Weeks    Status New      PT SHORT TERM GOAL #3   Title Pt will ambulate with L AFO and LRAD with supervision for 115' in order to improve household mobility.    Time 4    Period Weeks    Status New      PT SHORT TERM GOAL #4   Title Pt will perform all bed mobility with supervision in order to decr caregiver burden.    Baseline needs min A for rolling to L and pushing up to sit    Time 4    Period Weeks    Status New      PT SHORT TERM GOAL #5   Title Pt will undergo assessment of gait speed when appropriate - with LTG to be written    Baseline not yet assessed    Time 4    Period Weeks    Status New             PT Long Term Goals - 05/07/20 1402      PT LONG TERM GOAL #1   Title Pt will perform stand step transfers with no AD with mod I in order to decr caregiver burden. ALL LTGS DUE 05/08/20    Baseline performs with min A with therapist for proper weight shifting.    Time 13    Period Weeks    Status Not Met      PT LONG TERM GOAL #2   Title Pt will tolerate static standing for at least 5 minutes with single UE vs. no UE support in order to improve tolerance for ADLs.    Baseline able to tolerate dynamic standing activities for 5 minutes for stepping with RLE with min/mod A from therapist    Time 13    Period Weeks    Status Achieved      PT LONG TERM GOAL #3   Title Pt will ambulate at least 200' with hemiwalker  vs. LRAD and supervision in order to improve functional houeshold mobility.    Time 13    Period Weeks    Status On-going      PT LONG TERM GOAL #4   Title Pt will perform at least 5 sit <> stand transfers from mat table and standard height chair with mod I with no UE support vs. single UE support in order to demo improved functional transfers    Baseline performs sit <> stands with UE support with supervision    Time 13    Period Weeks    Status Partially Met      PT LONG TERM GOAL #5   Title Pt and pt's daughter will be independent with final HEP for weight bearing/lower extremity stretching/strengthening.    Time 13    Period Weeks    Status On-going          Revised/on-going LTGs for re-cert:  PT Long Term Goals - 05/07/20 1402      PT LONG TERM GOAL #1   Title Pt will perform stand step transfers with no AD with mod I in order to decr caregiver burden. ALL LTGS DUE 07/02/20    Baseline --    Time 8    Period Weeks    Status New    Target Date 07/02/20      PT LONG TERM GOAL #2   Title Pt will tolerate static standing for at least 5 minutes with no UE support with supervision in order to improve tolerance for ADLs.    Baseline able to tolerate dynamic standing activities for 5 minutes for stepping with RLE with mod A from therapist    Time 8    Period Weeks    Status New      PT LONG TERM GOAL #3   Title Pt will ambulate at least 230' over level indoor/unlevel outdoor surfaces with LRAD with L AFO with min guard in order to improve functional mobility.    Time 8    Period Weeks    Status New      PT LONG TERM GOAL #4   Title Pt will perform at least 5 sit <> stand transfers from mat table and standard height chair with mod I with no UE support in order to demo improved functional transfers    Baseline --    Time 8    Period Weeks    Status On-going      PT LONG TERM GOAL #5   Title Pt and pt's daughter will be independent with final HEP for weight  bearing/lower extremity stretching/strengthening.    Time 8    Period Weeks    Status On-going              05/07/20 1420  Plan  Clinical Impression Statement Performed 10th visit progress note today due to pt needing re-cert for extended POC and for assessing LTGs. Pt reporting previous headache with OT and ST sessions, however pt reporting no headache with PT today, but was more easily distractible today. Focus of today's skilled session was NMR for weight shifting towards LLE and postural alignment for incr step length with RLE. Progressed to gait training with no AD with manual facilitation from therapist for L weight shifting and L hip extension for incr step length with RLE. Pt continues to need min A for stand step transfers for balance and to help facilitate weight shifting to LLE. Clinic L posterior AFO donned for all standing activity - pt should be receiving her own AFO in the next couple of weeks and will be receiving Botox to LUE tomorrow. Pt is making slow progress towards LTGs. Will re-cert for an additional 2x week for 8 weeks in order to address functional mobility and independence with transfers and gait. STGs/LTGs revised or are on-going as appropriate.  Personal Factors and Comorbidities Comorbidity 3+;Past/Current Experience;Time since onset of injury/illness/exacerbation;Behavior Pattern  Comorbidities PMH significant for large right MCA CVA, HTN, T2DM, HLD, hx of Left foot fifth metatarsal fracture with osteopenia, asthma.  Examination-Activity Limitations Stand;Locomotion Level;Transfers;Toileting;Continence;Dressing  Examination-Participation Restrictions Community Activity  Pt will benefit from skilled therapeutic intervention in order to improve on the following deficits Abnormal gait;Decreased activity tolerance;Decreased coordination;Decreased balance;Decreased cognition;Decreased endurance;Decreased knowledge of use of DME;Decreased range of motion;Decreased  strength;Difficulty walking;Impaired tone;Impaired UE functional use;Postural dysfunction;Impaired sensation  Stability/Clinical Decision Making Evolving/Moderate complexity  Rehab Potential Good  PT Frequency 2x / week  PT Duration 8 weeks  PT Treatment/Interventions ADLs/Self Care Home Management;Aquatic Therapy;Electrical Stimulation;DME Instruction;Gait training;Functional mobility training;Neuromuscular re-education;Balance training;Therapeutic exercise;Therapeutic activities;Patient/family education;Orthotic Fit/Training;Passive range of motion;Energy conservation  PT Next Visit Plan need to get pt scheduled for more visits. continue with stepping activities with RLE, gait training with no AD. how did botox go? weight shifting towards L. gait training with RW (when appropriate with LUE spasticity) and hand hold assist, standing balance/weight shifting. anything in a mini squat position to break up tone/spasticity.  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 11/30/2019   Spastic hemiparesis affecting nondominant side ([place]) 11/30/2019   Chronic bilateral low back pain with sciatica 11/30/2019   Metatarsal fracture 09/22/2019   Depression 09/22/2019   Asthma 09/22/2019   Palliative care by specialist    Goals of care, counseling/discussion    Encephalopathy    Acute CVA (cerebrovascular accident) ([place]) 09/14/2019   Acute arterial  ischemic stroke, multifocal, anterior circulation, right ([place]) 09/14/2019   Severe comorbid illness    AMS (altered mental status) 09/13/2019   Controlled diabetes mellitus type 2 with complications ([place]) 23/76/2831   Hyperlipidemia 09/02/2017   Hypertension 09/02/2017   Chest pain 09/02/2017    Arliss Journey, PT, DPT  05/07/2020, 2:12 PM  [facility] [address] [place], [place], [address] Phone: [phone]   Fax:  [phone]  Name: Harper Vandervoort MRN: [id number] Date of Birth: [birth year]

## 2020-05-07 NOTE — Progress Notes (Signed)
Dysport Injection for spasticity using needle EMG guidance  Dilution: 200 Units/ml Indication: Severe spasticity which interferes with ADL,mobility and/or  hygiene and is unresponsive to medication management and other conservative care Informed consent was obtained after describing risks and benefits of the procedure with the patient. This includes bleeding, bruising, infection, excessive weakness, or medication side effects. A REMS form is on file and signed. Needle:  needle electrode Number of units per muscle Dysport Left Trunk Pec 200 Upper ext Biceps 200 FCR 200 FDS 200 FDP 200 Lower ext PT 300 FDL 200 All injections were done after obtaining appropriate EMG activity and after negative drawback for blood. The patient tolerated the procedure well. Post procedure instructions were given. A followup appointment was made.   

## 2020-05-08 ENCOUNTER — Other Ambulatory Visit: Payer: Self-pay

## 2020-05-08 ENCOUNTER — Ambulatory Visit: Payer: Medicare Other | Admitting: Occupational Therapy

## 2020-05-08 ENCOUNTER — Encounter: Payer: Self-pay | Admitting: Physical Therapy

## 2020-05-08 ENCOUNTER — Ambulatory Visit: Payer: Medicare Other

## 2020-05-08 ENCOUNTER — Ambulatory Visit: Payer: Medicare Other | Admitting: Physical Therapy

## 2020-05-08 ENCOUNTER — Encounter: Payer: Self-pay | Admitting: Occupational Therapy

## 2020-05-08 DIAGNOSIS — I69318 Other symptoms and signs involving cognitive functions following cerebral infarction: Secondary | ICD-10-CM

## 2020-05-08 DIAGNOSIS — R293 Abnormal posture: Secondary | ICD-10-CM

## 2020-05-08 DIAGNOSIS — M6281 Muscle weakness (generalized): Secondary | ICD-10-CM

## 2020-05-08 DIAGNOSIS — R2681 Unsteadiness on feet: Secondary | ICD-10-CM

## 2020-05-08 DIAGNOSIS — M25622 Stiffness of left elbow, not elsewhere classified: Secondary | ICD-10-CM

## 2020-05-08 DIAGNOSIS — M79602 Pain in left arm: Secondary | ICD-10-CM

## 2020-05-08 DIAGNOSIS — I69354 Hemiplegia and hemiparesis following cerebral infarction affecting left non-dominant side: Secondary | ICD-10-CM

## 2020-05-08 DIAGNOSIS — R29818 Other symptoms and signs involving the nervous system: Secondary | ICD-10-CM

## 2020-05-08 DIAGNOSIS — R41841 Cognitive communication deficit: Secondary | ICD-10-CM | POA: Diagnosis not present

## 2020-05-08 NOTE — Therapy (Signed)
[facility] [address] [place], [place], [address] Phone: [phone]   Fax:  [phone]  Speech Language Pathology Treatment  Patient Details  Name: Christina Evans MRN: [id number] Date of Birth: 03/14/1961 Referring Provider (SLP): Antony Contras, MD   Encounter Date: 05/08/2020   End of Session - 05/08/20 1707    Visit Number 8    Number of Visits 17    Date for SLP Re-Evaluation 06/19/20    SLP Start Time 0850    SLP Stop Time  0930    SLP Time Calculation (min) 40 min    Activity Tolerance Patient tolerated treatment well           Past Medical History:  Diagnosis Date  . Asthma   . CVA (cerebral vascular accident) ([place])   . Diabetes mellitus without complication [facility])     Past Surgical History:  Procedure Laterality Date  . IR ANGIO INTRA EXTRACRAN SEL COM CAROTID INNOMINATE BILAT MOD SED  09/15/2019  . IR ANGIO VERTEBRAL SEL VERTEBRAL UNI L MOD SED  09/15/2019    There were no vitals filed for this visit.   Subjective Assessment - 05/08/20 0842    Subjective Pt arrives alone today.    Currently in Pain? No/denies                 ADULT SLP TREATMENT - 05/08/20 0907      General Information   Behavior/Cognition Cooperative;Distractible;Decreased sustained attention;Pleasant mood      Cognitive-Linquistic Treatment   Treatment focused on Cognition;Patient/family/caregiver education    Skilled Treatment SLP spent session working on pt's attention in functional ways - pt was cued back to conversation each time she looked out the window at the parking lot, SLP req'd to bring pt back to hold eye contact with SLP x5 in 4 minutes. SLP provided pt wiht task (simple matching word task) and had to bring pt back to pen/pencil task x5 in 3 minutes. SLP intruduced pt to observer and asked her to repeat observer's name - pt did not recall name after 3 mintues. Pt was assisted to write down  observer's name and after 5th time of SLP asking and pt repeating observer's name multiple times every 5 minutes, pt recalled the observer's name with phonemic cues. Pt has decr'd literacy with [place] and [place].       Assessment / Recommendations / Plan   Plan Continue with current plan of care      Progression Toward Goals   Progression toward goals Not progressing toward goals (comment)   no family present 2nd straight session             SLP Short Term Goals - 05/08/20 [place] #1   Title pt will demonstrate attention appropriate to benefit from continued speech therapy over 4 sessions    Baseline 04-29-20, 05-01-20    Period --   or 9 visits, for all STGs   Status Partially Met      SLP SHORT TERM GOAL #2   Title pt will demo selective attention to simple linguistic task for 90 seconds with occasionl min A x 3 sessions    Baseline 04/23/20, 04-29-20    Status Achieved            SLP Long Term Goals - 05/08/20 1708      SLP LONG TERM GOAL #1   Title pt will demo sustained/selective attention for  a simple 5 minute linguistic task with rare min A over 3 sessions    Time 5    Period Weeks   or 17 total sessions, for all LTGS   Status On-going      SLP LONG TERM GOAL #2   Title pt will demonstrate attention appropriate for continued ST over 8 sessions    Baseline 05-01-20, 05-06-20    Time 5    Period Weeks    Status On-going            Plan - 05/08/20 1708    Clinical Impression Statement Pt presents with significant cognitive linguistic deficits stemming primarily from attentional deficits - ability to stay on topic was not as good as previous session --pt with circuitous and meandering topics warranting cues back to topic. She will cont to do well to do tasks at home which are familiar and functional such as counting the reps of her arm and leg exercises. Daughter not present for session today; given severity of pt's deficits daughter's presence  likely necessary for any carryover at home. OT/PT provided handout for daily needs (exercises and splint wear). Pt would cont to benefit from skilled ST targeting primarily attention. Pt may well benefit from attention med if not already prescribed. If an attention med would be initiated, re-evaluation using another cognitive insttrument may be necessary, and goals modified as needed.    Speech Therapy Frequency 2x / week    Duration --   8 weeks, or 17 total sessions   Treatment/Interventions Cognitive reorganization;Internal/external aids;Patient/family education;Compensatory strategies;SLP instruction and feedback;Cueing hierarchy;Functional tasks;Environmental controls;Multimodal communcation approach    Potential to Achieve Goals Fair    Potential Considerations Severity of impairments;Cooperation/participation level;Ability to learn/carryover information           Patient will benefit from skilled therapeutic intervention in order to improve the following deficits and impairments:   Cognitive communication deficit    Problem List Patient Active Problem List   Diagnosis Date Noted  . Cerebrovascular accident (CVA) due to thrombosis of precerebral artery ([place]) 11/30/2019  . 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 Spastic hemiparesis affecting nondominant side ([place]) 11/30/2019  . Chronic bilateral low back pain with sciatica 11/30/2019  . Metatarsal fracture 09/22/2019  . Depression 09/22/2019  . Asthma 09/22/2019  . Palliative care by specialist   . Goals of care, counseling/discussion   . Encephalopathy   . Acute CVA (cerebrovascular accident) ([place]) 09/14/2019  . Acute arterial ischemic stroke, multifocal, anterior circulation, right ([place]) 09/14/2019  . Severe comorbid illness   . AMS (altered mental status) 09/13/2019  . Controlled diabetes mellitus type 2 with complications ([place]) 02/54/2706  . Hyperlipidemia 09/02/2017  .  Hypertension 09/02/2017  . Chest pain 09/02/2017    [facility] ,[place], [place]  05/08/2020, 5:09 PM  [facility] [address] [place], [place], [address] Phone: [phone]   Fax:  [phone]   Name: Korinne Greenstein MRN: [id number] Date of Birth: [birth year]

## 2020-05-08 NOTE — Therapy (Signed)
[facility] [address] [place], [place], [address] Phone: [phone]   Fax:  [phone]  Physical Therapy Treatment  Patient Details  Name: Christina Evans MRN: [id number] Date of Birth: 09/17/1961 Referring Provider (PT): Micki Riley, MD   Encounter Date: 05/08/2020   PT End of Session - 05/08/20 0851    Visit Number 20    Number of Visits 35    Date for PT Re-Evaluation 08/05/20   written for 60 day POC   Authorization Type [employer] Medicare    Progress Note Due on Visit 29   did progress note on visit 19   PT Start Time 0804    PT Stop Time 0845    PT Time Calculation (min) 41 min    Activity Tolerance Patient tolerated treatment well    Behavior During Therapy [facility] for tasks assessed/performed           Past Medical History:  Diagnosis Date   Asthma    CVA (cerebral vascular accident) (HCC)    Diabetes mellitus without complication (HCC)     Past Surgical History:  Procedure Laterality Date   IR ANGIO INTRA EXTRACRAN SEL COM CAROTID INNOMINATE BILAT MOD SED  09/15/2019   IR ANGIO VERTEBRAL SEL VERTEBRAL UNI L MOD SED  09/15/2019    There were no vitals filed for this visit.   Subjective Assessment - 05/08/20 0809    Subjective No headache - ate crackers this morning. Saw Dr. Wynn Banker yesterday - received botox in LUE and LLE.    Patient is accompained by: Family member   daughter [name]   Pertinent History PMH significant for large right MCA CVA, HTN, T2DM, HLD, hx of Left foot fifth metatarsal fracture with osteopenia, asthma.    Limitations Standing;Walking    How long can you stand comfortably? 1 minute (when daughter is changing her when she is wearing diapers).    How long can you walk comfortably? a few feet.    Diagnostic tests MRI brain with moderate sized acute to early subacute left ACA territory infarct andlarge chronic right MCA territory infarct.    Patient Stated Goals  wants to get better, wants to be independent.    Currently in Pain? No/denies                             [facility] Adult PT Treatment/Exercise - 05/08/20 0001      Transfers   Transfers Sit to Stand;Stand to Sit    Sit to Stand 5: Supervision;4: Min guard    Sit to Stand Details Manual facilitation for placement;Manual facilitation for weight shifting;Verbal cues for technique;Verbal cues for sequencing;Tactile cues for weight shifting;Tactile cues for sequencing    Sit to Stand Details (indicate cue type and reason) with no UE support today, multiple reps throughout today's session, therapist putting foot between pt's B feet for incr BOS, with manual facilitation for forward weight shift when standing      Stand to Sit 4: Min assist    Stand to Sit Details (indicate cue type and reason) Manual facilitation for placement;Manual facilitation for weight bearing    Stand to Sit Details without UE support, faciliation for hinging at hips and maintaing weight bearing through LLE      Ambulation/Gait   Ambulation/Gait Yes    Ambulation/Gait Assistance 3: Mod assist;4: Min assist    Ambulation/Gait Assistance Details with pt's RUE on therapist's shoulder, pt  performing mod facilitation for L hip extension and weight shifting through LLE for incr step length with RLE, pt improved with incr reps as well as needed visual cues to step to therapist's foot with RLE for improved R step length and weight shift to LLE    Ambulation Distance (Feet) 75 Feet    Assistive device None    Gait Pattern Step-through pattern;Decreased stance time - left;Decreased hip/knee flexion - left;Decreased weight shift to left;Decreased dorsiflexion - left;Left circumduction;Left hip hike;Narrow base of support;Poor foot clearance - left    Ambulation Surface Level;Indoor      Neuro Re-ed    Neuro Re-ed Details  Prior to standing NMR for weight shifting therapist performed seated calf stretch with LLE extended  4 x 30 seconds each. Standing at edge of mat performed standing lateral weight shifting with tactile cues for pt to shift towards therapist's hand towards L with 5 second holds x6 reps, needing min/mod facilitative cues for full weight shift. At edge of mat table with therapist to pt's left performed lateral weight shifting to L and R, with mod/max facilitation from therapist and verbal cues to shift to L, progressing to adding big stepping forward and backwards with RLE while maintaining weight shift to L - 2 x 10 reps with no UE support Performed an additional 10 reps of stepping R foot onto 2" step and tapping R toes, beginning with single UE support and progressing to none, and an additional 5 reps of no UE support with bringing R foot and placing it on 2" step with 5 second holds. Needing demo cues for technique.                     PT Short Term Goals - 05/07/20 1428      PT SHORT TERM GOAL #1   Title Pt will receive L AFO for functional gait, mobility, and transfers. ALL STGS DUE 06/04/20    Time 4    Period Weeks    Status New    Target Date 06/04/20      PT SHORT TERM GOAL #2   Title Pt will perform stand step transfers with min guard consistently in order to decr caregiver burder.    Time 4    Period Weeks    Status New      PT SHORT TERM GOAL #3   Title Pt will ambulate with L AFO and LRAD with supervision for 115' in order to improve household mobility.    Time 4    Period Weeks    Status New      PT SHORT TERM GOAL #4   Title Pt will perform all bed mobility with supervision in order to decr caregiver burden.    Baseline needs min A for rolling to L and pushing up to sit    Time 4    Period Weeks    Status New      PT SHORT TERM GOAL #5   Title Pt will undergo assessment of gait speed when appropriate - with LTG to be written    Baseline not yet assessed    Time 4    Period Weeks    Status New             PT Long Term Goals - 05/07/20 1402      PT  LONG TERM GOAL #1   Title Pt will perform stand step transfers with no AD with mod I in order to decr  caregiver burden. ALL LTGS DUE 07/02/20    Baseline --    Time 8    Period Weeks    Status New    Target Date 07/02/20      PT LONG TERM GOAL #2   Title Pt will tolerate static standing for at least 5 minutes with no UE support with supervision in order to improve tolerance for ADLs.    Baseline able to tolerate dynamic standing activities for 5 minutes for stepping with RLE with mod A from therapist    Time 8    Period Weeks    Status New      PT LONG TERM GOAL #3   Title Pt will ambulate at least 230' over level indoor/unlevel outdoor surfaces with LRAD with L AFO with min guard in order to improve functional mobility.    Time 8    Period Weeks    Status New      PT LONG TERM GOAL #4   Title Pt will perform at least 5 sit <> stand transfers from mat table and standard height chair with mod I with no UE support in order to demo improved functional transfers    Baseline --    Time 8    Period Weeks    Status On-going      PT LONG TERM GOAL #5   Title Pt and pt's daughter will be independent with final HEP for weight bearing/lower extremity stretching/strengthening.    Time 8    Period Weeks    Status On-going                 Plan - 05/08/20 0853    Clinical Impression Statement Focus of today's skilled session was NMR for weight shifting towards LLE and stepping with RLE, gait training, and sit <> stands with no UE support. Pt able to perform sit <> stands throughout today's session pretty consistently with no UE support, and min/mod facilitation for shifting towards LLE and keeping forward flexed posture. Pt more focused during today's session compared to Monday with improved carryover of improved step length with RLE. Will continue to progress towards LTGs.    Personal Factors and Comorbidities Comorbidity 3+;Past/Current Experience;Time since onset of  injury/illness/exacerbation;Behavior Pattern    Comorbidities PMH significant for large right MCA CVA, HTN, T2DM, HLD, hx of Left foot fifth metatarsal fracture with osteopenia, asthma.    Examination-Activity Limitations Stand;Locomotion Level;Transfers;Toileting;Continence;Dressing    Examination-Participation Restrictions Community Activity    Stability/Clinical Decision Making Evolving/Moderate complexity    Rehab Potential Good    PT Frequency 2x / week    PT Duration 8 weeks    PT Treatment/Interventions ADLs/Self Care Home Management;Aquatic Therapy;Electrical Stimulation;DME Instruction;Gait training;Functional mobility training;Neuromuscular re-education;Balance training;Therapeutic exercise;Therapeutic activities;Patient/family education;Orthotic Fit/Training;Passive range of motion;Energy conservation    PT Next Visit Plan need to get pt scheduled for more visits. continue with stepping activities with RLE, gait training with no AD. how did botox go? weight shifting towards L. gait training with RW (when appropriate with LUE spasticity) and hand hold assist, standing balance/weight shifting. anything in a mini squat position to break up tone/spasticity.    Consulted and Agree with Plan of Care Patient;Family member/caregiver           Patient will benefit from skilled therapeutic intervention in order to improve the following deficits and impairments:  Abnormal gait, Decreased activity tolerance, Decreased coordination, Decreased balance, Decreased cognition, Decreased endurance, Decreased knowledge of use of DME, Decreased range of motion, Decreased  strength, Difficulty walking, Impaired tone, Impaired UE functional use, Postural dysfunction, Impaired sensation  Visit Diagnosis: Muscle weakness (generalized)  Unsteadiness on feet  Other symptoms and signs involving the nervous system  Abnormal posture     Problem List Patient Active Problem List   Diagnosis Date Noted    Cerebrovascular accident (CVA) due to thrombosis of precerebral artery (HCC) 11/30/2019   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 11/30/2019   Spastic hemiparesis affecting nondominant side (HCC) 11/30/2019   Chronic bilateral low back pain with sciatica 11/30/2019   Metatarsal fracture 09/22/2019   Depression 09/22/2019   Asthma 09/22/2019   Palliative care by specialist    Goals of care, counseling/discussion    Encephalopathy    Acute CVA (cerebrovascular accident) (HCC) 09/14/2019   Acute arterial ischemic stroke, multifocal, anterior circulation, right (HCC) 09/14/2019   Severe comorbid illness    AMS (altered mental status) 09/13/2019   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   Hyperlipidemia 09/02/2017   Hypertension 09/02/2017   Chest pain 09/02/2017    Drake Leach, PT, DPT  05/08/2020, 9:08 AM  [facility] [address] [place], [place], [address] Phone: [phone]   Fax:  [phone]  Name: Christina Evans MRN: [id number] Date of Birth: Apr 06, 1961

## 2020-05-08 NOTE — Therapy (Signed)
[facility] [address] [place], [place], [address] Phone: [phone]   Fax:  [phone]  Occupational Therapy Treatment  Patient Details  Name: Christina Evans MRN: [id number] Date of Birth: [birth year] Referring Provider (OT): Dr. Pearlean Brownie   Encounter Date: 05/08/2020   OT End of Session - 05/08/20 1223    Visit Number 18    Number of Visits 31    Date for OT Re-Evaluation 06/24/20    Authorization Type UHC medicare and medicaid as secondary. Pt will need PN every 10th visit    Authorization Time Period 90 days - 07/22/2020 (renwewed on 04/29/2020)    Authorization - Visit Number 18    Authorization - Number of Visits 20    OT Start Time 0932    OT Stop Time 1015    OT Time Calculation (min) 43 min    Activity Tolerance Patient tolerated treatment well           Past Medical History:  Diagnosis Date  . Asthma   . CVA (cerebral vascular accident) (HCC)   . Diabetes mellitus without complication [facility])     Past Surgical History:  Procedure Laterality Date  . IR ANGIO INTRA EXTRACRAN SEL COM CAROTID INNOMINATE BILAT MOD SED  09/15/2019  . IR ANGIO VERTEBRAL SEL VERTEBRAL UNI L MOD SED  09/15/2019    There were no vitals filed for this visit.   Subjective Assessment - 05/08/20 1211    Subjective  I had that shot in my arm yesterday    Pertinent History Pt s/p recent R ACA CVA 09/13/19. 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    Currently in Pain? No/denies                        OT Treatments/Exercises (OP) - 05/08/20 1212      Neurological Re-education Exercises   Other Exercises 1 Pt s/p botox injection yesterday and today presents with some deceased spasticity and improved isolated  movement in LUE.  In supine addressed AAROM for elbow flexion/extension, supination/pronation, wrist flexion extension, IR and ER and some ability to initiate finger extension today ( 10 reps each with max cues for attention).  In supine also addresed closed chain using PVC square for place in hold at mid reach level as well as bilateral overhead reach in closed chain.  Transitioned from supine to sidelying to side propping on L elbow and pt able to maintain control of LUE as weight bearing extremity with reaching activity using RUE.  Pt able to then transition into sitting with cues only.                     OT Short Term Goals - 05/01/20 0953      OT SHORT TERM GOAL #1   Title Pt and dtr will be mod  I with HEP to address ROM for RUE - 03/20/2020    Status Achieved      OT SHORT TERM GOAL #2   Title Pt and dtr will verbalize understanding for AE for cutting food on plate    Status Achieved      OT SHORT TERM GOAL #3   Title Pt will be mod I with washing face and  set up for bushing teeth.    Status Achieved      OT SHORT TERM GOAL #4   Title Pt will be mod a for UB dressing    Status Achieved      OT SHORT TERM GOAL #5   Title Pt will be mod a for LB dressing    Status Achieved      OT SHORT TERM GOAL #6   Title Pt will require mod a to hike pants    Status Achieved      OT SHORT TERM GOAL #7   Title Pt and dtr will verbalize understanding of toileting schedule to address incontinence    Status Achieved      OT SHORT TERM GOAL #8   Title Pt and dtr will be mod with splint wear and care (resting hand splint at night)    Status Achieved      OT SHORT TERM GOAL  #9   TITLE Pt will demonstrate ability to set table and wipe table (with dishes/silverware set on table) with supervision - 05/27/2020    Status On-going             OT Long Term Goals - 04/29/20 1049      OT LONG TERM GOAL #1   Title Pt and dtr will be mod I with upgraded home activities program to  include focus ADL's, transitional movements and balance. - 06/24/2020    Status On-going      OT LONG TERM GOAL #2   Title Pt will be supervision for toilet transfers    Status Achieved      OT LONG TERM GOAL #3   Title Pt will be no more than min a for hiking pants and toilet hygiene    Status On-going      OT LONG TERM GOAL #4   Title Pt will tolerate 110* of L shoulder flexion and 90* abduction during HEP for ease with ADL's with more than " a little " pain based on faces.    Status Achieved      OT LONG TERM GOAL #5   Title Pt will be supervision for simple snack prep    Status On-going      OT LONG TERM GOAL #6   Title Pt and dtr will complete FOTO outcome scale    Status On-going                 Plan - 05/08/20 1222    Clinical Impression Statement Pt s/p botox injection to LUE yesterday with signficant improvement in isolated movement today.    OT Occupational Profile and History Detailed Assessment- Review of Records and additional review of physical, cognitive, psychosocial history related to current functional performance    Occupational performance deficits (Please refer to evaluation for details): ADL's;IADL's;Work;Leisure;Social Participation    Body Structure / Function / Physical Skills ADL;Balance;Continence;Endurance;GMC;IADL;Mobility;Sensation;ROM;Pain;Strength;Tone;UE functional use    Rehab Potential Good    Clinical Decision Making Multiple treatment options, significant modification of task necessary    Comorbidities Affecting Occupational Performance: Presence of comorbidities impacting occupational performance    Comorbidities impacting occupational performance description: see above    Modification or Assistance to Complete Evaluation  Max significant modification of tasks or assist is necessary to complete    OT Frequency 2x / week    OT Duration 8 weeks    OT Treatment/Interventions Self-care/ADL training;Aquatic Therapy;Electrical  Stimulation;Ultrasound;Moist Heat;Therapeutic exercise;Neuromuscular education;Manual Therapy;Functional Mobility Training;DME and/or AE instruction;Passive range of motion;Patient/family education;Cognitive  remediation/compensation;Therapeutic activities;Splinting;Balance training    Plan NMR for LUE/trunk, postural alignment and control, balance and functional mobility,    Consulted and Agree with Plan of Care Patient           Patient will benefit from skilled therapeutic intervention in order to improve the following deficits and impairments:   Body Structure / Function / Physical Skills: ADL, Balance, Continence, Endurance, GMC, IADL, Mobility, Sensation, ROM, Pain, Strength, Tone, UE functional use       Visit Diagnosis: Muscle weakness (generalized)  Unsteadiness on feet  Other symptoms and signs involving the nervous system  Abnormal posture  Other symptoms and signs involving cognitive functions following cerebral infarction  Spastic hemiplegia of left nondominant side as late effect of cerebral infarction (HCC)  Pain in left arm  Stiffness of left upper arm joint    Problem List Patient Active Problem List   Diagnosis Date Noted  . Cerebrovascular accident (CVA) due to thrombosis of precerebral artery (HCC) 11/30/2019  . 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 Spastic hemiparesis affecting nondominant side (HCC) 11/30/2019  . Chronic bilateral low back pain with sciatica 11/30/2019  . Metatarsal fracture 09/22/2019  . Depression 09/22/2019  . Asthma 09/22/2019  . Palliative care by specialist   . Goals of care, counseling/discussion   . Encephalopathy   . Acute CVA (cerebrovascular accident) (HCC) 09/14/2019  . Acute arterial ischemic stroke, multifocal, anterior circulation, right (HCC) 09/14/2019  . Severe comorbid illness   . AMS (altered mental status) 09/13/2019  . Controlled diabetes mellitus type 2  with complications (HCC) 09/02/2017  . Hyperlipidemia 09/02/2017  . Hypertension 09/02/2017  . Chest pain 09/02/2017    Norton Pastel, OTR/L 05/08/2020, 12:25 PM  [facility] [address] [place], [place], [address] Phone: [phone]   Fax:  [phone]  Name: Christina Evans MRN: [id number] Date of Birth: [birth year]

## 2020-05-09 ENCOUNTER — Encounter: Payer: Medicare Other | Admitting: Speech Pathology

## 2020-05-11 LAB — DRUG TOX MONITOR 1 W/CONF, ORAL FLD

## 2020-05-11 LAB — DRUG TOX ALC METAB W/CON, ORAL FLD: Alcohol Metabolite: NEGATIVE ng/mL (ref <25)

## 2020-05-11 LAB — DRUG TOX METHYLPHEN W/CONF,ORAL FLD: Methylphenidate: NEGATIVE ng/mL (ref <1.0)

## 2020-05-13 ENCOUNTER — Ambulatory Visit: Payer: Medicare Other | Admitting: Occupational Therapy

## 2020-05-13 ENCOUNTER — Other Ambulatory Visit: Payer: Self-pay

## 2020-05-13 ENCOUNTER — Telehealth: Payer: Self-pay | Admitting: *Deleted

## 2020-05-13 ENCOUNTER — Encounter: Payer: Self-pay | Admitting: Occupational Therapy

## 2020-05-13 ENCOUNTER — Ambulatory Visit: Payer: Medicare Other | Admitting: Physical Therapy

## 2020-05-13 ENCOUNTER — Encounter: Payer: Self-pay | Admitting: Physical Therapy

## 2020-05-13 ENCOUNTER — Telehealth: Payer: Self-pay

## 2020-05-13 DIAGNOSIS — R293 Abnormal posture: Secondary | ICD-10-CM

## 2020-05-13 DIAGNOSIS — R41841 Cognitive communication deficit: Secondary | ICD-10-CM | POA: Diagnosis not present

## 2020-05-13 DIAGNOSIS — R2681 Unsteadiness on feet: Secondary | ICD-10-CM

## 2020-05-13 DIAGNOSIS — R2689 Other abnormalities of gait and mobility: Secondary | ICD-10-CM

## 2020-05-13 DIAGNOSIS — M6281 Muscle weakness (generalized): Secondary | ICD-10-CM

## 2020-05-13 DIAGNOSIS — R29818 Other symptoms and signs involving the nervous system: Secondary | ICD-10-CM

## 2020-05-13 DIAGNOSIS — I69354 Hemiplegia and hemiparesis following cerebral infarction affecting left non-dominant side: Secondary | ICD-10-CM

## 2020-05-13 DIAGNOSIS — M79602 Pain in left arm: Secondary | ICD-10-CM

## 2020-05-13 DIAGNOSIS — I69318 Other symptoms and signs involving cognitive functions following cerebral infarction: Secondary | ICD-10-CM

## 2020-05-13 DIAGNOSIS — M25622 Stiffness of left elbow, not elsewhere classified: Secondary | ICD-10-CM

## 2020-05-13 NOTE — Therapy (Signed)
[facility] [address] [place], [place], [address] Phone: [phone]   Fax:  [phone]  Occupational Therapy Treatment  Patient Details  Name: Christina Evans MRN: [id number] Date of Birth: [birth year] Referring Provider (OT): Dr. Pearlean Brownie   Encounter Date: 05/13/2020   OT End of Session - 05/13/20 1207    Visit Number 19    Number of Visits 31    Date for OT Re-Evaluation 06/24/20    Authorization Type UHC medicare and medicaid as secondary. Pt will need PN every 10th visit    Authorization Time Period 90 days - 07/22/2020 (renwewed on 04/29/2020)    Authorization - Visit Number 19    Authorization - Number of Visits 20    OT Start Time 1016    OT Stop Time 1100    OT Time Calculation (min) 44 min    Activity Tolerance Patient tolerated treatment well           Past Medical History:  Diagnosis Date  . Asthma   . CVA (cerebral vascular accident) (HCC)   . Diabetes mellitus without complication [facility])     Past Surgical History:  Procedure Laterality Date  . IR ANGIO INTRA EXTRACRAN SEL COM CAROTID INNOMINATE BILAT MOD SED  09/15/2019  . IR ANGIO VERTEBRAL SEL VERTEBRAL UNI L MOD SED  09/15/2019    There were no vitals filed for this visit.   Subjective Assessment - 05/13/20 1019    Subjective  This is hard    Pertinent History Pt s/p recent R ACA CVA 09/13/19. 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    Currently in Pain? No/denies                        OT Treatments/Exercises (OP) - 05/13/20 1201      Neurological Re-education Exercises   Other Exercises 1 Neuro re ed to address postural alignment and control first in high kneeling working on hip extension, thoracic extension,  rotation and elongation of trunk. Transitioned into side sitting and then propped on L elbow to decrease spasticity, increase proximal stability and activation of L shoulder girdle - incorporation of reaching activity using RUE to mobilize L shoulder and increase activation of LUE in weight bearing. Transitioned into standing without UE support and addressd functional ambulation using RW - pt with much better abiltty to allow assisted postioning of LUE on walker using hand orthosis since botox injection. Pt requires moderate cueing and facilitation for postural alignment/control, hip stability and decrease L hip retraction and flexion during stance phase. Pt also needs max cues for more normalized step length for LLE.  PT reports pt should be getting her own brace this week.                      OT Short Term Goals - 05/01/20 0953      OT SHORT TERM GOAL #1   Title Pt and dtr will be mod  I with HEP to address ROM for RUE - 03/20/2020    Status Achieved      OT SHORT TERM GOAL #2   Title Pt and dtr will verbalize understanding for AE for cutting food on plate    Status Achieved  OT SHORT TERM GOAL #3   Title Pt will be mod I with washing face and set up for bushing teeth.    Status Achieved      OT SHORT TERM GOAL #4   Title Pt will be mod a for UB dressing    Status Achieved      OT SHORT TERM GOAL #5   Title Pt will be mod a for LB dressing    Status Achieved      OT SHORT TERM GOAL #6   Title Pt will require mod a to hike pants    Status Achieved      OT SHORT TERM GOAL #7   Title Pt and dtr will verbalize understanding of toileting schedule to address incontinence    Status Achieved      OT SHORT TERM GOAL #8   Title Pt and dtr will be mod with splint wear and care (resting hand splint at night)    Status Achieved      OT SHORT TERM GOAL  #9   TITLE Pt will demonstrate ability to set table and wipe table (with dishes/silverware set on table) with supervision -  05/27/2020    Status On-going             OT Long Term Goals - 04/29/20 1049      OT LONG TERM GOAL #1   Title Pt and dtr will be mod I with upgraded home activities program to include focus ADL's, transitional movements and balance. - 06/24/2020    Status On-going      OT LONG TERM GOAL #2   Title Pt will be supervision for toilet transfers    Status Achieved      OT LONG TERM GOAL #3   Title Pt will be no more than min a for hiking pants and toilet hygiene    Status On-going      OT LONG TERM GOAL #4   Title Pt will tolerate 110* of L shoulder flexion and 90* abduction during HEP for ease with ADL's with more than " a little " pain based on faces.    Status Achieved      OT LONG TERM GOAL #5   Title Pt will be supervision for simple snack prep    Status On-going      OT LONG TERM GOAL #6   Title Pt and dtr will complete FOTO outcome scale    Status On-going                 Plan - 05/13/20 1206    Clinical Impression Statement Pt continues to make slow but steady progress with postural aligment and control as well as functional ambulation/balance.    OT Occupational Profile and History Detailed Assessment- Review of Records and additional review of physical, cognitive, psychosocial history related to current functional performance    Occupational performance deficits (Please refer to evaluation for details): ADL's;IADL's;Work;Leisure;Social Participation    Body Structure / Function / Physical Skills ADL;Balance;Continence;Endurance;GMC;IADL;Mobility;Sensation;ROM;Pain;Strength;Tone;UE functional use    Rehab Potential Good    Clinical Decision Making Multiple treatment options, significant modification of task necessary    Comorbidities Affecting Occupational Performance: Presence of comorbidities impacting occupational performance    Comorbidities impacting occupational performance description: see above    Modification or Assistance to Complete Evaluation  Max  significant modification of tasks or assist is necessary to complete    OT Frequency 2x / week    OT Duration 8 weeks  OT Treatment/Interventions Self-care/ADL training;Aquatic Therapy;Electrical Stimulation;Ultrasound;Moist Heat;Therapeutic exercise;Neuromuscular education;Manual Therapy;Functional Mobility Training;DME and/or AE instruction;Passive range of motion;Patient/family education;Cognitive remediation/compensation;Therapeutic activities;Splinting;Balance training    Plan NMR for LUE/trunk, postural alignment and control, balance and functional mobility,    Consulted and Agree with Plan of Care Patient           Patient will benefit from skilled therapeutic intervention in order to improve the following deficits and impairments:   Body Structure / Function / Physical Skills: ADL, Balance, Continence, Endurance, GMC, IADL, Mobility, Sensation, ROM, Pain, Strength, Tone, UE functional use       Visit Diagnosis: Muscle weakness (generalized)  Unsteadiness on feet  Other symptoms and signs involving the nervous system  Abnormal posture  Other symptoms and signs involving cognitive functions following cerebral infarction  Spastic hemiplegia of left nondominant side as late effect of cerebral infarction (HCC)  Pain in left arm  Stiffness of left upper arm joint    Problem List Patient Active Problem List   Diagnosis Date Noted  . Cerebrovascular accident (CVA) due to thrombosis of precerebral artery (HCC) 11/30/2019  . 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 Spastic hemiparesis affecting nondominant side (HCC) 11/30/2019  . Chronic bilateral low back pain with sciatica 11/30/2019  . Metatarsal fracture 09/22/2019  . Depression 09/22/2019  . Asthma 09/22/2019  . Palliative care by specialist   . Goals of care, counseling/discussion   . Encephalopathy   . Acute CVA (cerebrovascular accident) (HCC) 09/14/2019  .  Acute arterial ischemic stroke, multifocal, anterior circulation, right (HCC) 09/14/2019  . Severe comorbid illness   . AMS (altered mental status) 09/13/2019  . 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  . Hyperlipidemia 09/02/2017  . Hypertension 09/02/2017  . Chest pain 09/02/2017    Norton Pastel, OTR/L 05/13/2020, 12:09 PM  [facility] [address] [place], [place], [address] Phone: [phone]   Fax:  [phone]  Name: Christina Evans MRN: [id number] Date of Birth: [birth year]

## 2020-05-13 NOTE — Telephone Encounter (Signed)
[name] with the [facility] call : They have received the fax on 05/10/2020. However, it was missing a Letter of Medical Necessity. Will you please create one?   Please advise. Thank you.   Ph [phone] Fax [phone].

## 2020-05-13 NOTE — Telephone Encounter (Signed)
Need to know what info [facility] needs

## 2020-05-13 NOTE — Therapy (Signed)
[facility] [address] [place], [place], [address] Phone: [phone]   Fax:  [phone]  Physical Therapy Treatment  Patient Details  Name: Christina Evans MRN: [id number] Date of Birth: 1961-06-29 Referring Provider (PT): Garvin Fila, MD   Encounter Date: 05/13/2020   PT End of Session - 05/13/20 1211    Visit Number 21    Number of Visits 35    Date for PT Re-Evaluation 08/05/20   written for 60 day POC   Authorization Type UHC Medicare    Progress Note Due on Visit 29   did progress note on visit 33   PT Start Time 0930    PT Stop Time 1015    PT Time Calculation (min) 45 min    Activity Tolerance Patient tolerated treatment well    Behavior During Therapy [facility] for tasks assessed/performed           Past Medical History:  Diagnosis Date  . Asthma   . CVA (cerebral vascular accident) ([place])   . Diabetes mellitus without complication [facility])     Past Surgical History:  Procedure Laterality Date  . IR ANGIO INTRA EXTRACRAN SEL COM CAROTID INNOMINATE BILAT MOD SED  09/15/2019  . IR ANGIO VERTEBRAL SEL VERTEBRAL UNI L MOD SED  09/15/2019    There were no vitals filed for this visit.   Subjective Assessment - 05/13/20 0933    Subjective No headache this morning. Gets her AFO this week.    Patient is accompained by: Family member   daughter [name]   Pertinent History PMH significant for large right MCA CVA, HTN, T2DM, HLD, hx of Left foot fifth metatarsal fracture with osteopenia, asthma.    Limitations Standing;Walking    How long can you stand comfortably? 1 minute (when daughter is changing her when she is wearing diapers).    How long can you walk comfortably? a few feet.    Diagnostic tests MRI brain with moderate sized acute to early subacute left ACA territory infarct andlarge chronic right MCA territory infarct.    Patient Stated Goals wants to get better, wants to be independent.     Currently in Pain? No/denies                             [facility] Adult PT Treatment/Exercise - 05/13/20 0001      Transfers   Transfers Sit to Stand;Stand to Sit    Sit to Stand 4: Min guard;4: Min assist    Sit to Stand Details Manual facilitation for placement;Manual facilitation for weight shifting;Verbal cues for technique;Verbal cues for sequencing;Tactile cues for weight shifting;Tactile cues for sequencing    Sit to Stand Details (indicate cue type and reason) with pt's RUE on therapist's R shoulder (with minimal pressure), therapist facilitating forward weight shift, multiple reps performed throughout session    Stand to Sit 4: Min assist    Stand to Sit Details (indicate cue type and reason) Manual facilitation for placement;Manual facilitation for weight bearing    Stand to Sit Details without UE support, faciliation for hinging at hips and maintaing weight bearing through LLE    Transfer Cueing stand step transfers: with pt's RUE on therapist's shoulder, manual facilitation for weight shift and verbal cues for incr step length    Comments performed 2 x 5 reps with 2" block under RLE for incr weight shift and weight bearing through LLE for incr muscle  activation, when in standing therapist providing manual facilitation for incr hip extension activation      Neuro Re-ed    Neuro Re-ed Details  Pt with sneakers and L clinic PLS AFO donned with all activity. With therapist anteriorly and pt with wider BOS standing lateral weight shifting with focus on shifting weight through hips and maintaining midline. Pt with 2 instances of falling back to sit on mat with incr weight shifting towards LLE for incr time. At edge of mat table with therapist to pt's left performed lateral weight shifting to L and R, with mod/max facilitation from therapist and verbal cues to shift to L, progressing to adding big stepping forward and backwards with RLE while maintaining weight shift to L - 2 x  10 reps with no UE support. Performed an additional 10 reps of side stepping out towards R with RLE and then back to midline.  Attempting performing today with RLE tapping to 2" step, however pt only able to complete 2 step taps, pt needing demo cues to perform and mod verbal cues to attend to task and keep weight shifted towards therapist.       Exercises   Exercises Other Exercises    Other Exercises  Hip flexor stretch off edge of mat: 4 x 60 second reps, then after performing hip flexor stretch performing 2 x 10 reps bridging with manual facilitation through distal L thigh and for incr L hip extensor activation.                     PT Short Term Goals - 05/07/20 1428      PT SHORT TERM GOAL #1   Title Pt will receive L AFO for functional gait, mobility, and transfers. ALL STGS DUE 06/04/20    Time 4    Period Weeks    Status New    Target Date 06/04/20      PT SHORT TERM GOAL #2   Title Pt will perform stand step transfers with min guard consistently in order to decr caregiver burder.    Time 4    Period Weeks    Status New      PT SHORT TERM GOAL #3   Title Pt will ambulate with L AFO and LRAD with supervision for 115' in order to improve household mobility.    Time 4    Period Weeks    Status New      PT SHORT TERM GOAL #4   Title Pt will perform all bed mobility with supervision in order to decr caregiver burden.    Baseline needs min A for rolling to L and pushing up to sit    Time 4    Period Weeks    Status New      PT SHORT TERM GOAL #5   Title Pt will undergo assessment of gait speed when appropriate - with LTG to be written    Baseline not yet assessed    Time 4    Period Weeks    Status New             PT Long Term Goals - 05/07/20 1402      PT LONG TERM GOAL #1   Title Pt will perform stand step transfers with no AD with mod I in order to decr caregiver burden. ALL LTGS DUE 07/02/20    Baseline --    Time 8    Period Weeks    Status New  Target Date 07/02/20      PT LONG TERM GOAL #2   Title Pt will tolerate static standing for at least 5 minutes with no UE support with supervision in order to improve tolerance for ADLs.    Baseline able to tolerate dynamic standing activities for 5 minutes for stepping with RLE with mod A from therapist    Time 8    Period Weeks    Status New      PT LONG TERM GOAL #3   Title Pt will ambulate at least 230' over level indoor/unlevel outdoor surfaces with LRAD with L AFO with min guard in order to improve functional mobility.    Time 8    Period Weeks    Status New      PT LONG TERM GOAL #4   Title Pt will perform at least 5 sit <> stand transfers from mat table and standard height chair with mod I with no UE support in order to demo improved functional transfers    Baseline --    Time 8    Period Weeks    Status On-going      PT LONG TERM GOAL #5   Title Pt and pt's daughter will be independent with final HEP for weight bearing/lower extremity stretching/strengthening.    Time 8    Period Weeks    Status On-going                 Plan - 05/13/20 1213    Clinical Impression Statement Performed hip flexor stretching and bridging for hip extension activation at beginning of session due to pt reporting that these exercises have not been performed at home. Remainder of session focused on functional sit <> stand transfers with pt lightly placing RUE on therapist's shoulder for incr weight shifting and muscle activation through LLE. Pt needing moderate facilitation for standing weight shifting for incr step length with RLE. Pt to receive AFO brace on Tuesday. Will continue to progress towards LTGs.    Personal Factors and Comorbidities Comorbidity 3+;Past/Current Experience;Time since onset of injury/illness/exacerbation;Behavior Pattern    Comorbidities PMH significant for large right MCA CVA, HTN, T2DM, HLD, hx of Left foot fifth metatarsal fracture with osteopenia, asthma.     Examination-Activity Limitations Stand;Locomotion Level;Transfers;Toileting;Continence;Dressing    Examination-Participation Restrictions Community Activity    Stability/Clinical Decision Making Evolving/Moderate complexity    Rehab Potential Good    PT Frequency 2x / week    PT Duration 8 weeks    PT Treatment/Interventions ADLs/Self Care Home Management;Aquatic Therapy;Electrical Stimulation;DME Instruction;Gait training;Functional mobility training;Neuromuscular re-education;Balance training;Therapeutic exercise;Therapeutic activities;Patient/family education;Orthotic Fit/Training;Passive range of motion;Energy conservation    PT Next Visit Plan how is pt's AFO? (pt to receive it on tuesday). continue with stepping activities with RLE(with weight shifting towards LLE), gait training with no AD and with RW - with focus on facilitation for L weight shift and hip extension during terminal stance.    Consulted and Agree with Plan of Care Patient;Family member/caregiver           Patient will benefit from skilled therapeutic intervention in order to improve the following deficits and impairments:  Abnormal gait, Decreased activity tolerance, Decreased coordination, Decreased balance, Decreased cognition, Decreased endurance, Decreased knowledge of use of DME, Decreased range of motion, Decreased strength, Difficulty walking, Impaired tone, Impaired UE functional use, Postural dysfunction, Impaired sensation  Visit Diagnosis: Unsteadiness on feet  Muscle weakness (generalized)  Abnormal posture  Other abnormalities of gait and mobility  Problem List Patient Active Problem List   Diagnosis Date Noted  . Cerebrovascular accident (CVA) due to thrombosis of precerebral artery (HCC) 11/30/2019  . 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 Spastic hemiparesis affecting nondominant side (HCC) 11/30/2019  . Chronic bilateral low back pain  with sciatica 11/30/2019  . Metatarsal fracture 09/22/2019  . Depression 09/22/2019  . Asthma 09/22/2019  . Palliative care by specialist   . Goals of care, counseling/discussion   . Encephalopathy   . Acute CVA (cerebrovascular accident) (HCC) 09/14/2019  . Acute arterial ischemic stroke, multifocal, anterior circulation, right (HCC) 09/14/2019  . Severe comorbid illness   . AMS (altered mental status) 09/13/2019  . 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  . Hyperlipidemia 09/02/2017  . Hypertension 09/02/2017  . Chest pain 09/02/2017    Drake Leach, PT, DPT  05/13/2020, 12:29 PM  [facility] [address] [place], [place], [address] Phone: [phone]   Fax:  [phone]  Name: Raeshawn Tafolla MRN: [id number] Date of Birth: 12/24/1960

## 2020-05-13 NOTE — Telephone Encounter (Signed)
Oral swab drug screen was negative for medications. She reported that she took her methylphenidate the day before the test, but language barrier may have affected understanding of medication questions as well.

## 2020-05-14 ENCOUNTER — Ambulatory Visit: Payer: Medicare Other | Admitting: Speech Pathology

## 2020-05-14 NOTE — Telephone Encounter (Signed)
Medication can not be returned to [facility] and patient is no longer being seen at our office. Botox placed in office sample stock.

## 2020-05-14 NOTE — Telephone Encounter (Signed)
I called patient to follow-up on this and I spoke with her daughter. I asked the daughter if patient wanted to reschedule her appointment. She states that patient is seeing a different neurologist and does not wish to reschedule. Patient has (6) 100U vials of Botox in our fridge from [employer]. What should we do with these?

## 2020-05-15 ENCOUNTER — Encounter: Payer: Medicare Other | Admitting: Occupational Therapy

## 2020-05-15 ENCOUNTER — Ambulatory Visit: Payer: Medicare Other | Admitting: Physical Therapy

## 2020-05-16 ENCOUNTER — Other Ambulatory Visit: Payer: Self-pay

## 2020-05-16 ENCOUNTER — Ambulatory Visit: Payer: Medicare Other | Admitting: Speech Pathology

## 2020-05-16 ENCOUNTER — Ambulatory Visit: Payer: Medicare Other | Admitting: Occupational Therapy

## 2020-05-16 ENCOUNTER — Ambulatory Visit: Payer: Medicare Other

## 2020-05-16 DIAGNOSIS — R2689 Other abnormalities of gait and mobility: Secondary | ICD-10-CM

## 2020-05-16 DIAGNOSIS — M6281 Muscle weakness (generalized): Secondary | ICD-10-CM

## 2020-05-16 DIAGNOSIS — I69354 Hemiplegia and hemiparesis following cerebral infarction affecting left non-dominant side: Secondary | ICD-10-CM

## 2020-05-16 DIAGNOSIS — R29818 Other symptoms and signs involving the nervous system: Secondary | ICD-10-CM

## 2020-05-16 DIAGNOSIS — M25622 Stiffness of left elbow, not elsewhere classified: Secondary | ICD-10-CM

## 2020-05-16 DIAGNOSIS — R41841 Cognitive communication deficit: Secondary | ICD-10-CM

## 2020-05-16 DIAGNOSIS — I69318 Other symptoms and signs involving cognitive functions following cerebral infarction: Secondary | ICD-10-CM

## 2020-05-16 NOTE — Patient Instructions (Signed)
Hi [name],  I feel it would be really helpful for you to join your mom for speech therapy appointments, perhaps at least every other time if you are able. Her ability to make progress depends a lot on whether she is able to carry over what we do in therapy at home, and I feel she will need some assistance to be able to do this. I know you are probably very busy, but anytime you are available to stay, we would love to have you!  Shon Hale (speech therapist)

## 2020-05-16 NOTE — Therapy (Signed)
[facility] [address] [place], [place], [address] Phone: [phone]   Fax:  [phone]  Physical Therapy Treatment  Patient Details  Name: Christina Evans MRN: [id number] Date of Birth: December 29, 1960 Referring Provider (PT): Micki Riley, MD   Encounter Date: 05/16/2020   PT End of Session - 05/16/20 0936    Visit Number 22    Number of Visits 35    Date for PT Re-Evaluation 08/05/20   written for 60 day POC   Authorization Type [employer]    Progress Note Due on Visit 29   did progress note on visit 19   PT Start Time 0933    PT Stop Time 1014    PT Time Calculation (min) 41 min    Equipment Utilized During Treatment Gait belt    Activity Tolerance Patient tolerated treatment well    Behavior During Therapy WFL for tasks assessed/performed           Past Medical History:  Diagnosis Date  . Asthma   . CVA (cerebral vascular accident) (HCC)   . Diabetes mellitus without complication [facility])     Past Surgical History:  Procedure Laterality Date  . IR ANGIO INTRA EXTRACRAN SEL COM CAROTID INNOMINATE BILAT MOD SED  09/15/2019  . IR ANGIO VERTEBRAL SEL VERTEBRAL UNI L MOD SED  09/15/2019    There were no vitals filed for this visit.   Subjective Assessment - 05/16/20 0936    Subjective Pt reports doing well. Got her new solid left AFO yesterday around 2 and wore until bedtime. Reports she did check skin and denies any issues. Got new shoes yesterday to fit.    Patient is accompained by: Family member   daughter [name]   Pertinent History PMH significant for large right MCA CVA, HTN, T2DM, HLD, hx of Left foot fifth metatarsal fracture with osteopenia, asthma.    Limitations Standing;Walking    How long can you stand comfortably? 1 minute (when daughter is changing her when she is wearing diapers).    How long can you walk comfortably? a few feet.    Diagnostic tests MRI brain with moderate sized  acute to early subacute left ACA territory infarct andlarge chronic right MCA territory infarct.    Patient Stated Goals wants to get better, wants to be independent.    Currently in Pain? No/denies                             [facility] Adult PT Treatment/Exercise - 05/16/20 0937      Transfers   Transfers Sit to Stand;Stand to Sit;Stand Pivot Transfers    Sit to Stand 4: Min guard;4: Min assist    Sit to Stand Details Verbal cues for technique;Manual facilitation for weight shifting    Sit to Stand Details (indicate cue type and reason) with patient's right UE on therapists shoulder    Stand to Sit 4: Min assist    Stand to Sit Details (indicate cue type and reason) Manual facilitation for weight shifting    Stand Pivot Transfers 4: Min assist    Stand Pivot Transfer Details (indicate cue type and reason) w/c to mat      Ambulation/Gait   Ambulation/Gait Yes    Ambulation/Gait Assistance 3: Mod assist    Ambulation/Gait Assistance Details Pt reported a little discomfort in left foot half way so took standing rest break and then continued. PT provided  NMR during gait with pt's RUE on therapist's shoulder and PT facilitating weight shift left during gait.  Verbal cues to increase right step length    Ambulation Distance (Feet) 115 Feet    Assistive device --   Left AFO   Gait Pattern Step-through pattern;Decreased step length - right;Decreased step length - left;Decreased stance time - left;Decreased hip/knee flexion - left    Ambulation Surface Level;Indoor      Neuro Re-ed    Neuro Re-ed Details  Standing weight shifting with RUE on therapist's shoulder. Pt initially just pushing hips out side to side. Was given verbal and tactile cues to try to shift whole body over instead. Still challenged to bring shoulders with her. PT demonstrated and then she was able to better perform. Stepping forward with RLE and back x 10 with PT facilitating weight shift to left and then tapping  2" step with RLE x 10 with PT facilitating weight shift as well.  Sit to stand from edge of mat with 2" step under right foot to faciliate left weight shift with pt reaching forward over therapist's shoulder with right arm and PT facilitating weight shift to left min assist. Performed x 5. Pt reports right leg does fatigue some.                  PT Education - 05/16/20 1028    Education Details Education provided on AFO wear time. Gave written instructions as pt was not able to repeat back to therapist from beginning to end of session. She was stating to wear all day when asked to recall. Stressed importance of gradually increasing wear time and inspecting skin.    Person(s) Educated Patient    Methods Explanation;Handout    Comprehension Verbalized understanding            PT Short Term Goals - 05/07/20 1428      PT SHORT TERM GOAL #1   Title Pt will receive L AFO for functional gait, mobility, and transfers. ALL STGS DUE 06/04/20    Time 4    Period Weeks    Status New    Target Date 06/04/20      PT SHORT TERM GOAL #2   Title Pt will perform stand step transfers with min guard consistently in order to decr caregiver burder.    Time 4    Period Weeks    Status New      PT SHORT TERM GOAL #3   Title Pt will ambulate with L AFO and LRAD with supervision for 115' in order to improve household mobility.    Time 4    Period Weeks    Status New      PT SHORT TERM GOAL #4   Title Pt will perform all bed mobility with supervision in order to decr caregiver burden.    Baseline needs min A for rolling to L and pushing up to sit    Time 4    Period Weeks    Status New      PT SHORT TERM GOAL #5   Title Pt will undergo assessment of gait speed when appropriate - with LTG to be written    Baseline not yet assessed    Time 4    Period Weeks    Status New             PT Long Term Goals - 05/07/20 1402      PT LONG TERM GOAL #1   Title  Pt will perform stand step  transfers with no AD with mod I in order to decr caregiver burden. ALL LTGS DUE 07/02/20    Baseline --    Time 8    Period Weeks    Status New    Target Date 07/02/20      PT LONG TERM GOAL #2   Title Pt will tolerate static standing for at least 5 minutes with no UE support with supervision in order to improve tolerance for ADLs.    Baseline able to tolerate dynamic standing activities for 5 minutes for stepping with RLE with mod A from therapist    Time 8    Period Weeks    Status New      PT LONG TERM GOAL #3   Title Pt will ambulate at least 230' over level indoor/unlevel outdoor surfaces with LRAD with L AFO with min guard in order to improve functional mobility.    Time 8    Period Weeks    Status New      PT LONG TERM GOAL #4   Title Pt will perform at least 5 sit <> stand transfers from mat table and standard height chair with mod I with no UE support in order to demo improved functional transfers    Baseline --    Time 8    Period Weeks    Status On-going      PT LONG TERM GOAL #5   Title Pt and pt's daughter will be independent with final HEP for weight bearing/lower extremity stretching/strengthening.    Time 8    Period Weeks    Status On-going                 Plan - 05/16/20 1030    Clinical Impression Statement PT just received new left AFO yesterday. PT focused on wear time education and working on weight shifting and gait with new AFO. Pt was able to keep more upright posture with keeping right UE on PT shoulder with activities. Decreased stance time on left noted.    Personal Factors and Comorbidities Comorbidity 3+;Past/Current Experience;Time since onset of injury/illness/exacerbation;Behavior Pattern    Comorbidities PMH significant for large right MCA CVA, HTN, T2DM, HLD, hx of Left foot fifth metatarsal fracture with osteopenia, asthma.    Examination-Activity Limitations Stand;Locomotion Level;Transfers;Toileting;Continence;Dressing     Examination-Participation Restrictions Community Activity    Stability/Clinical Decision Making Evolving/Moderate complexity    Rehab Potential Good    PT Frequency 2x / week    PT Duration 8 weeks    PT Treatment/Interventions ADLs/Self Care Home Management;Aquatic Therapy;Electrical Stimulation;DME Instruction;Gait training;Functional mobility training;Neuromuscular re-education;Balance training;Therapeutic exercise;Therapeutic activities;Patient/family education;Orthotic Fit/Training;Passive range of motion;Energy conservation    PT Next Visit Plan How is wear time going with new AFO? I gave written instructions as pt not able to retain from beginning to end of session. continue with stepping activities with RLE(with weight shifting towards LLE), gait training with no AD and with RW - with focus on facilitation for L weight shift and hip extension during terminal stance.    Consulted and Agree with Plan of Care Patient;Family member/caregiver           Patient will benefit from skilled therapeutic intervention in order to improve the following deficits and impairments:  Abnormal gait, Decreased activity tolerance, Decreased coordination, Decreased balance, Decreased cognition, Decreased endurance, Decreased knowledge of use of DME, Decreased range of motion, Decreased strength, Difficulty walking, Impaired tone, Impaired UE functional use, Postural dysfunction,  Impaired sensation  Visit Diagnosis: Other abnormalities of gait and mobility  Muscle weakness (generalized)     Problem List Patient Active Problem List   Diagnosis Date Noted  . Cerebrovascular accident (CVA) due to thrombosis of precerebral artery (HCC) 11/30/2019  . 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 Spastic hemiparesis affecting nondominant side (HCC) 11/30/2019  . Chronic bilateral low back pain with sciatica 11/30/2019  . Metatarsal fracture 09/22/2019  .  Depression 09/22/2019  . Asthma 09/22/2019  . Palliative care by specialist   . Goals of care, counseling/discussion   . Encephalopathy   . Acute CVA (cerebrovascular accident) (HCC) 09/14/2019  . Acute arterial ischemic stroke, multifocal, anterior circulation, right (HCC) 09/14/2019  . Severe comorbid illness   . AMS (altered mental status) 09/13/2019  . 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  . Hyperlipidemia 09/02/2017  . Hypertension 09/02/2017  . Chest pain 09/02/2017    Ronn Melena, PT, DPT, NCS 05/16/2020, 10:35 AM  [facility] [address] [place], [place], [address] Phone: [phone]   Fax:  [phone]  Name: Christina Evans MRN: [id number] Date of Birth: 06-21-1961

## 2020-05-16 NOTE — Patient Instructions (Signed)
Gradually increase wear time with new AFO. Start with 2 hours and then remove and inspect skin. You are checking for redness especially around edges of AFO and over bony areas. Leave off for 1-2 hours and then put back on for 2 hours. Brace should be worn when going to be up transferring. If going to be sitting for awhile can remove. As long as skin is doing well increase wear time by an hour every 2-3 days.

## 2020-05-16 NOTE — Therapy (Signed)
[facility] [address] [place], [place], [address] Phone: [phone]   Fax:  [phone]  Speech Language Pathology Treatment  Patient Details  Name: Christina Evans MRN: [id number] Date of Birth: November 16, 1961 Referring Provider (SLP): Antony Contras, MD   Encounter Date: 05/16/2020   End of Session - 05/16/20 0938    Visit Number 9    Number of Visits 17    Date for SLP Re-Evaluation 06/19/20    SLP Start Time 0851   checked in 6 min late   SLP Stop Time  0931    SLP Time Calculation (min) 40 min    Activity Tolerance Patient tolerated treatment well           Past Medical History:  Diagnosis Date  . Asthma   . CVA (cerebral vascular accident) ([place])   . Diabetes mellitus without complication [facility])     Past Surgical History:  Procedure Laterality Date  . IR ANGIO INTRA EXTRACRAN SEL COM CAROTID INNOMINATE BILAT MOD SED  09/15/2019  . IR ANGIO VERTEBRAL SEL VERTEBRAL UNI L MOD SED  09/15/2019    There were no vitals filed for this visit.   Subjective Assessment - 05/16/20 0853    Subjective Daughter dropped patient off today    Currently in Pain? No/denies                 ADULT SLP TREATMENT - 05/16/20 0854      General Information   Behavior/Cognition Cooperative;Distractible;Decreased sustained attention;Pleasant mood      Treatment Provided   Treatment provided Cognitive-Linquistic      Pain Assessment   Pain Assessment No/denies pain      Cognitive-Linquistic Treatment   Treatment focused on Cognition;Patient/family/caregiver education    Skilled Treatment Daughter did not stay for session today. Patient required mod-max cues for orientation/attention in calendar-based activity. Sustained attention does appear to be improving; pt: "The little pill is helping some." Sustained attention in simple tasks average 7 min today with occasional min cues. Pt immediate recall of images 1:3  100%, 1:6 90%, 2:10 90%. Required usual mod cues for sequencing simple pictures (attention to detail). SLP sent note home with patient requesting daughter attend with patient at least every other visit if possible, as pt ability to progress limited without assistance/carryover outside of ST.       Assessment / Recommendations / Plan   Plan Continue with current plan of care      Progression Toward Goals   Progression toward goals Not progressing toward goals (comment)   no family present 3rd session           SLP Education - 05/16/20 [phone]    Education Details daughter should attend some sessions with patient    Person(s) Educated Patient   note sent home for daughter   Methods Explanation;Handout    Comprehension Verbalized understanding            SLP Short Term Goals - 05/16/20 0854      SLP SHORT TERM GOAL #1   Title pt will demonstrate attention appropriate to benefit from continued speech therapy over 4 sessions    Baseline 04-29-20, 05-01-20    Period --   or 9 visits, for all STGs   Status Partially Met      SLP SHORT TERM GOAL #2   Title pt will demo selective attention to simple linguistic task for 90 seconds with occasionl min A x 3 sessions  Baseline 04/23/20, 04-29-20    Status Achieved            SLP Long Term Goals - 05/16/20 0854      SLP LONG TERM GOAL #1   Title pt will demo sustained/selective attention for a simple 5 minute linguistic task with rare min A over 3 sessions    Time 4    Period Weeks   or 17 total sessions, for all LTGS   Status On-going      SLP LONG TERM GOAL #2   Title pt will demonstrate attention appropriate for continued ST over 8 sessions    Baseline 05-01-20, 05-06-20    Time 4    Period Weeks    Status On-going            Plan - 05/16/20 0940    Clinical Impression Statement Pt presents with significant cognitive linguistic deficits stemming primarily from attentional deficits. She has started Ritalin and feels this is helping  her focus. She will cont to do well to do tasks at home which are familiar and functional such as counting the reps of her arm and leg exercises. Daughter not present for session today; given severity of pt's deficits daughter's presence likely necessary for any carryover at home. Note sent home with pt re: this as pt alone today for 3rd session in a row. Pt would cont to benefit from skilled ST targeting primarily attention. Re-evaluation using another cognitive instrument may be appropriate, and goals modified as needed as pt has started attention med.    Speech Therapy Frequency 2x / week    Duration --   8 weeks or 17 sessions   Treatment/Interventions Cognitive reorganization;Internal/external aids;Patient/family education;Compensatory strategies;SLP instruction and feedback;Cueing hierarchy;Functional tasks;Environmental controls;Multimodal communcation approach    Potential to Achieve Goals Fair    Potential Considerations Severity of impairments;Cooperation/participation level;Ability to learn/carryover information    Consulted and Agree with Plan of Care Patient           Patient will benefit from skilled therapeutic intervention in order to improve the following deficits and impairments:   Cognitive communication deficit    Problem List Patient Active Problem List   Diagnosis Date Noted  . Cerebrovascular accident (CVA) due to thrombosis of precerebral artery ([place]) 11/30/2019  . 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 Spastic hemiparesis affecting nondominant side ([place]) 11/30/2019  . Chronic bilateral low back pain with sciatica 11/30/2019  . Metatarsal fracture 09/22/2019  . Depression 09/22/2019  . Asthma 09/22/2019  . Palliative care by specialist   . Goals of care, counseling/discussion   . Encephalopathy   . Acute CVA (cerebrovascular accident) ([place]) 09/14/2019  . Acute arterial ischemic stroke, multifocal, anterior  circulation, right ([place]) 09/14/2019  . Severe comorbid illness   . AMS (altered mental status) 09/13/2019  . Controlled diabetes mellitus type 2 with complications ([place]) 77/41/2878  . Hyperlipidemia 09/02/2017  . Hypertension 09/02/2017  . Chest pain 09/02/2017   Deneise Lever, [place], [place] 05/16/2020, 9:41 AM  [facility] [address] [place], [place], [address] Phone: [phone]   Fax:  [phone]   Name: Christina Evans MRN: [id number] Date of Birth: 1961/09/13

## 2020-05-17 ENCOUNTER — Encounter: Payer: Self-pay | Admitting: Occupational Therapy

## 2020-05-17 NOTE — Progress Notes (Signed)
I agree with the above plan 

## 2020-05-17 NOTE — Therapy (Signed)
[facility] [address] [place], [place], [address] Phone: [phone]   Fax:  [phone]  Occupational Therapy Treatment  Patient Details  Name: Christina Evans MRN: [id number] Date of Birth: 25-May-1961 Referring Provider (OT): Dr. Pearlean Brownie   Encounter Date: 05/16/2020   OT End of Session - 05/17/20 0943    Visit Number 20    Number of Visits 31    Date for OT Re-Evaluation 06/24/20    Authorization Type UHC medicare and medicaid as secondary. Pt will need PN every 10th visit    Authorization Time Period 90 days - 07/22/2020 (renwewed on 04/29/2020)    Authorization - Visit Number 20    Authorization - Number of Visits 20    OT Start Time 1018    OT Stop Time 1058    OT Time Calculation (min) 40 min    Activity Tolerance Patient tolerated treatment well           Past Medical History:  Diagnosis Date   Asthma    CVA (cerebral vascular accident) (HCC)    Diabetes mellitus without complication (HCC)     Past Surgical History:  Procedure Laterality Date   IR ANGIO INTRA EXTRACRAN SEL COM CAROTID INNOMINATE BILAT MOD SED  09/15/2019   IR ANGIO VERTEBRAL SEL VERTEBRAL UNI L MOD SED  09/15/2019    There were no vitals filed for this visit.   Subjective Assessment - 05/17/20 0942    Subjective  Denies pain    Pertinent History Pt s/p recent R ACA CVA 09/13/19. 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    Currently in Pain? No/denies                 Treatment: Supine gentle passive stretch to LUE shoulder in flexion, abduction and elbow extension.  Supine Closed chain shoulder flexion with PVC pipe frame max facilitation then unilateral AA/ROM shoulder flexion AA/ROM supination/ pronation and  elbow flexion/ extension with min facilitation. Seated weightbearing through LUE edge of mat with body on arm movements, mod facilitation. UE ranger for low range AA/ROM shoulder flexion and horizontal abduction/ adduct., mod facilitation.                 OT Short Term Goals - 05/01/20 0953      OT SHORT TERM GOAL #1   Title Pt and dtr will be mod  I with HEP to address ROM for RUE - 03/20/2020    Status Achieved      OT SHORT TERM GOAL #2   Title Pt and dtr will verbalize understanding for AE for cutting food on plate    Status Achieved      OT SHORT TERM GOAL #3   Title Pt will be mod I with washing face and set up for bushing teeth.    Status Achieved      OT SHORT TERM GOAL #4   Title Pt will be mod a for UB dressing    Status Achieved      OT SHORT TERM GOAL #5   Title Pt will be mod a for LB dressing    Status Achieved      OT SHORT TERM GOAL #6   Title Pt will require mod a to hike pants  Status Achieved      OT SHORT TERM GOAL #7   Title Pt and dtr will verbalize understanding of toileting schedule to address incontinence    Status Achieved      OT SHORT TERM GOAL #8   Title Pt and dtr will be mod with splint wear and care (resting hand splint at night)    Status Achieved      OT SHORT TERM GOAL  #9   TITLE Pt will demonstrate ability to set table and wipe table (with dishes/silverware set on table) with supervision - 05/27/2020    Status On-going             OT Long Term Goals - 04/29/20 1049      OT LONG TERM GOAL #1   Title Pt and dtr will be mod I with upgraded home activities program to include focus ADL's, transitional movements and balance. - 06/24/2020    Status On-going      OT LONG TERM GOAL #2   Title Pt will be supervision for toilet transfers    Status Achieved      OT LONG TERM GOAL #3   Title Pt will be no more than min a for hiking pants and toilet hygiene    Status On-going      OT LONG TERM GOAL #4   Title Pt will  tolerate 110* of L shoulder flexion and 90* abduction during HEP for ease with ADL's with more than " a little " pain based on faces.    Status Achieved      OT LONG TERM GOAL #5   Title Pt will be supervision for simple snack prep    Status On-going      OT LONG TERM GOAL #6   Title Pt and dtr will complete FOTO outcome scale    Status On-going                 Plan - 05/17/20 0945    Clinical Impression Statement For the reporting period of 04/08/20- 05/16/20, Pt continues to make slow but steady progress with postural aligment and control as well as functional ambulation/balance. Pt demonstrates improving isolated movement in LUE s/p botox injection. Pt is progressing towards all goals. Pt requires continued skilled OT to address LUE weakness, decreased trunk control, cognitive deficits and decreeased functional mobility in order to amximize pt's safety and I with ADLs/ IADLs.    OT Occupational Profile and History Detailed Assessment- Review of Records and additional review of physical, cognitive, psychosocial history related to current functional performance    Occupational performance deficits (Please refer to evaluation for details): ADL's;IADL's;Work;Leisure;Social Participation    Body Structure / Function / Physical Skills ADL;Balance;Continence;Endurance;GMC;IADL;Mobility;Sensation;ROM;Pain;Strength;Tone;UE functional use    Rehab Potential Good    Clinical Decision Making Multiple treatment options, significant modification of task necessary    Comorbidities Affecting Occupational Performance: Presence of comorbidities impacting occupational performance    Comorbidities impacting occupational performance description: see above    Modification or Assistance to Complete Evaluation  Max significant modification of tasks or assist is necessary to complete    OT Frequency 2x / week    OT Duration 8 weeks    OT Treatment/Interventions Self-care/ADL training;Aquatic  Therapy;Electrical Stimulation;Ultrasound;Moist Heat;Therapeutic exercise;Neuromuscular education;Manual Therapy;Functional Mobility Training;DME and/or AE instruction;Passive range of motion;Patient/family education;Cognitive remediation/compensation;Therapeutic activities;Splinting;Balance training    Plan NMR for LUE/trunk, postural alignment and control, balance and functional mobility,    Consulted and Agree with Plan of Care Patient  Patient will benefit from skilled therapeutic intervention in order to improve the following deficits and impairments:   Body Structure / Function / Physical Skills: ADL, Balance, Continence, Endurance, GMC, IADL, Mobility, Sensation, ROM, Pain, Strength, Tone, UE functional use       Visit Diagnosis: Muscle weakness (generalized)  Spastic hemiplegia of left nondominant side as late effect of cerebral infarction (HCC)  Other symptoms and signs involving cognitive functions following cerebral infarction  Other symptoms and signs involving the nervous system  Stiffness of left upper arm joint    Problem List Patient Active Problem List   Diagnosis Date Noted   Cerebrovascular accident (CVA) due to thrombosis of precerebral artery ([place]) 11/30/2019   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 11/30/2019   Spastic hemiparesis affecting nondominant side ([place]) 11/30/2019   Chronic bilateral low back pain with sciatica 11/30/2019   Metatarsal fracture 09/22/2019   Depression 09/22/2019   Asthma 09/22/2019   Palliative care by specialist    Goals of care, counseling/discussion    Encephalopathy    Acute CVA (cerebrovascular accident) ([place]) 09/14/2019   Acute arterial ischemic stroke, multifocal, anterior circulation, right ([place]) 09/14/2019   Severe comorbid illness    AMS (altered mental status) 09/13/2019   Controlled diabetes mellitus type 2 with complications ([place]) 17/61/6073    Hyperlipidemia 09/02/2017   Hypertension 09/02/2017   Chest pain 09/02/2017    Devron Cohick 05/17/2020, 9:47 AM Theone Murdoch, OTR/L [phone] Phone: [phone] 9:52 AM 05/17/20 [facility] [address] [place], [place], [address] Phone: [phone]   Fax:  [phone]  Name: Christina Evans MRN: [id number] Date of Birth: 1961/03/05

## 2020-05-20 ENCOUNTER — Ambulatory Visit: Payer: Medicare Other | Admitting: Occupational Therapy

## 2020-05-20 ENCOUNTER — Encounter: Payer: Self-pay | Admitting: Occupational Therapy

## 2020-05-20 ENCOUNTER — Ambulatory Visit: Payer: Medicare Other

## 2020-05-20 ENCOUNTER — Other Ambulatory Visit: Payer: Self-pay

## 2020-05-20 DIAGNOSIS — M79602 Pain in left arm: Secondary | ICD-10-CM

## 2020-05-20 DIAGNOSIS — R2689 Other abnormalities of gait and mobility: Secondary | ICD-10-CM

## 2020-05-20 DIAGNOSIS — R2681 Unsteadiness on feet: Secondary | ICD-10-CM

## 2020-05-20 DIAGNOSIS — M6281 Muscle weakness (generalized): Secondary | ICD-10-CM

## 2020-05-20 DIAGNOSIS — R293 Abnormal posture: Secondary | ICD-10-CM

## 2020-05-20 DIAGNOSIS — M25622 Stiffness of left elbow, not elsewhere classified: Secondary | ICD-10-CM

## 2020-05-20 DIAGNOSIS — R41841 Cognitive communication deficit: Secondary | ICD-10-CM | POA: Diagnosis not present

## 2020-05-20 DIAGNOSIS — R29818 Other symptoms and signs involving the nervous system: Secondary | ICD-10-CM

## 2020-05-20 DIAGNOSIS — I69318 Other symptoms and signs involving cognitive functions following cerebral infarction: Secondary | ICD-10-CM

## 2020-05-20 DIAGNOSIS — I69354 Hemiplegia and hemiparesis following cerebral infarction affecting left non-dominant side: Secondary | ICD-10-CM

## 2020-05-20 NOTE — Therapy (Signed)
[facility] [address] [place], [place], [address] Phone: [phone]   Fax:  [phone]  Physical Therapy Treatment  Patient Details  Name: Christina Evans MRN: [id number] Date of Birth: Apr 06, 1961 Referring Provider (PT): Garvin Fila, MD   Encounter Date: 05/20/2020   PT End of Session - 05/20/20 0934    Visit Number 23    Number of Visits 35    Date for PT Re-Evaluation 08/05/20   written for 60 day POC   Authorization Type UHC Medicare    Progress Note Due on Visit 29   did progress note on visit 28   PT Start Time 0932    PT Stop Time 1012    PT Time Calculation (min) 40 min    Equipment Utilized During Treatment Gait belt    Activity Tolerance Patient tolerated treatment well    Behavior During Therapy WFL for tasks assessed/performed           Past Medical History:  Diagnosis Date  . Asthma   . CVA (cerebral vascular accident) ([place])   . Diabetes mellitus without complication [facility])     Past Surgical History:  Procedure Laterality Date  . IR ANGIO INTRA EXTRACRAN SEL COM CAROTID INNOMINATE BILAT MOD SED  09/15/2019  . IR ANGIO VERTEBRAL SEL VERTEBRAL UNI L MOD SED  09/15/2019    There were no vitals filed for this visit.   Subjective Assessment - 05/20/20 0934    Subjective Pt present with daughter, Bary Leriche, today. Has been been able to wear AFO as her daughter could not get it on. Pt's daughter reports she had a fall on Friday when tried to get on toilet by herself. Fell to the left side. Small bruise to left shin. Denies any other injuries or pain.    Patient is accompained by: Family member   daughter [name]   Pertinent History PMH significant for large right MCA CVA, HTN, T2DM, HLD, hx of Left foot fifth metatarsal fracture with osteopenia, asthma.    Limitations Standing;Walking    How long can you stand comfortably? 1 minute (when daughter is changing her when she is wearing  diapers).    How long can you walk comfortably? a few feet.    Diagnostic tests MRI brain with moderate sized acute to early subacute left ACA territory infarct andlarge chronic right MCA territory infarct.    Patient Stated Goals wants to get better, wants to be independent.    Currently in Pain? No/denies                             [facility] Adult PT Treatment/Exercise - 05/20/20 0935      Transfers   Transfers Sit to Stand;Stand to Sit;Stand Pivot Transfers    Sit to Stand 4: Min guard    Sit to Stand Details Tactile cues for weight beaing;Verbal cues for technique    Stand to Sit 4: Min assist    Stand to Sit Details (indicate cue type and reason) Verbal cues for technique    Stand Pivot Transfers 4: Min assist    Stand Pivot Transfer Details (indicate cue type and reason) w/c to mat      Ambulation/Gait   Ambulation/Gait Yes    Ambulation/Gait Assistance 3: Mod assist    Ambulation/Gait Assistance Details Pt ambulated with right UE on PT shoulder with PT in front and facilitating weight shift and pelvic rotation with  gait. Verbal cues to increase right step length.  BP=148/82 after gait. Pt ambulated another bout with RW with left hand grip attachment. PT at pelvis trying to help facilitate left weight shift and pelvic rotation with steps. Pt reports legs tired after gait.     Ambulation Distance (Feet) 115 Feet   115' x 1   Assistive device --   left AFO   Gait Pattern Step-through pattern;Decreased hip/knee flexion - left;Decreased step length - right;Decreased step length - left    Ambulation Surface Level;Indoor      Therapeutic Activites    Therapeutic Activities Other Therapeutic Activities    Other Therapeutic Activities PT worked with daughter to be able to donn left AFO. Demonstrated first and then had daughter perform. Advised to be sure shoe is fully open and then get forefoot in from side a bit before bringing heel in to brace and then pushing down  through thigh if needed. Daughter able to perform after.       Neuro Re-ed    Neuro Re-ed Details  Standing in front of mat: weight shifting side to side x 10 with RUE on therapist's shoulder x 10 then with stepping forward and back x 10 each leg facillitating weight shift each time                  PT Education - 05/20/20 1721    Education Details PT educated patient and daughter again on wear time with AFO. Education on how to donn AFO.    Person(s) Educated Patient;Child(ren)    Methods Explanation;Demonstration    Comprehension Verbalized understanding;Returned demonstration            PT Short Term Goals - 05/07/20 1428      PT SHORT TERM GOAL #1   Title Pt will receive L AFO for functional gait, mobility, and transfers. ALL STGS DUE 06/04/20    Time 4    Period Weeks    Status New    Target Date 06/04/20      PT SHORT TERM GOAL #2   Title Pt will perform stand step transfers with min guard consistently in order to decr caregiver burder.    Time 4    Period Weeks    Status New      PT SHORT TERM GOAL #3   Title Pt will ambulate with L AFO and LRAD with supervision for 115' in order to improve household mobility.    Time 4    Period Weeks    Status New      PT SHORT TERM GOAL #4   Title Pt will perform all bed mobility with supervision in order to decr caregiver burden.    Baseline needs min A for rolling to L and pushing up to sit    Time 4    Period Weeks    Status New      PT SHORT TERM GOAL #5   Title Pt will undergo assessment of gait speed when appropriate - with LTG to be written    Baseline not yet assessed    Time 4    Period Weeks    Status New             PT Long Term Goals - 05/07/20 1402      PT LONG TERM GOAL #1   Title Pt will perform stand step transfers with no AD with mod I in order to decr caregiver burden. ALL LTGS DUE 07/02/20  Baseline --    Time 8    Period Weeks    Status New    Target Date 07/02/20      PT LONG  TERM GOAL #2   Title Pt will tolerate static standing for at least 5 minutes with no UE support with supervision in order to improve tolerance for ADLs.    Baseline able to tolerate dynamic standing activities for 5 minutes for stepping with RLE with mod A from therapist    Time 8    Period Weeks    Status New      PT LONG TERM GOAL #3   Title Pt will ambulate at least 230' over level indoor/unlevel outdoor surfaces with LRAD with L AFO with min guard in order to improve functional mobility.    Time 8    Period Weeks    Status New      PT LONG TERM GOAL #4   Title Pt will perform at least 5 sit <> stand transfers from mat table and standard height chair with mod I with no UE support in order to demo improved functional transfers    Baseline --    Time 8    Period Weeks    Status On-going      PT LONG TERM GOAL #5   Title Pt and pt's daughter will be independent with final HEP for weight bearing/lower extremity stretching/strengthening.    Time 8    Period Weeks    Status On-going                 Plan - 05/20/20 1722    Clinical Impression Statement PT continued to work on improving left weight shift with new AFO with daughter present to observe today. Pt continues to show improvements with left weight shift.    Personal Factors and Comorbidities Comorbidity 3+;Past/Current Experience;Time since onset of injury/illness/exacerbation;Behavior Pattern    Comorbidities PMH significant for large right MCA CVA, HTN, T2DM, HLD, hx of Left foot fifth metatarsal fracture with osteopenia, asthma.    Examination-Activity Limitations Stand;Locomotion Level;Transfers;Toileting;Continence;Dressing    Examination-Participation Restrictions Community Activity    Stability/Clinical Decision Making Evolving/Moderate complexity    Rehab Potential Good    PT Frequency 2x / week    PT Duration 8 weeks    PT Treatment/Interventions ADLs/Self Care Home Management;Aquatic Therapy;Electrical  Stimulation;DME Instruction;Gait training;Functional mobility training;Neuromuscular re-education;Balance training;Therapeutic exercise;Therapeutic activities;Patient/family education;Orthotic Fit/Training;Passive range of motion;Energy conservation    PT Next Visit Plan How is wear time going with new AFO? I gave written instructions as pt not able to retain from beginning to end of session. continue with stepping activities with RLE(with weight shifting towards LLE), gait training with no AD and with RW - with focus on facilitation for L weight shift and hip extension during terminal stance.    Consulted and Agree with Plan of Care Patient;Family member/caregiver           Patient will benefit from skilled therapeutic intervention in order to improve the following deficits and impairments:  Abnormal gait, Decreased activity tolerance, Decreased coordination, Decreased balance, Decreased cognition, Decreased endurance, Decreased knowledge of use of DME, Decreased range of motion, Decreased strength, Difficulty walking, Impaired tone, Impaired UE functional use, Postural dysfunction, Impaired sensation  Visit Diagnosis: Other abnormalities of gait and mobility  Muscle weakness (generalized)     Problem List Patient Active Problem List   Diagnosis Date Noted  . Cerebrovascular accident (CVA) due to thrombosis of precerebral artery (HCC) 11/30/2019  .  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 Spastic hemiparesis affecting nondominant side (HCC) 11/30/2019  . Chronic bilateral low back pain with sciatica 11/30/2019  . Metatarsal fracture 09/22/2019  . Depression 09/22/2019  . Asthma 09/22/2019  . Palliative care by specialist   . Goals of care, counseling/discussion   . Encephalopathy   . Acute CVA (cerebrovascular accident) (HCC) 09/14/2019  . Acute arterial ischemic stroke, multifocal, anterior circulation, right (HCC) 09/14/2019  .  Severe comorbid illness   . AMS (altered mental status) 09/13/2019  . 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  . Hyperlipidemia 09/02/2017  . Hypertension 09/02/2017  . Chest pain 09/02/2017    Ronn Melena, PT, DPT, NCS 05/20/2020, 5:24 PM  [facility] [address] [place], [place], [address] Phone: [phone]   Fax:  [phone]  Name: Christina Evans MRN: [id number] Date of Birth: 28-Oct-1961

## 2020-05-20 NOTE — Therapy (Signed)
[facility] [address] [place], [place], [address] Phone: [phone]   Fax:  [phone]  Occupational Therapy Treatment  Patient Details  Name: Christina Evans MRN: [id number] Date of Birth: 1961-02-22 Referring Provider (OT): Dr. Leonie Man   Encounter Date: 05/20/2020   OT End of Session - 05/20/20 1227    Visit Number 21    Number of Visits 31    Date for OT Re-Evaluation 06/24/20    Authorization Type UHC medicare and medicaid as secondary. Pt will need PN every 10th visit    Authorization Time Period 90 days - 07/22/2020 (renwewed on 04/29/2020)    Authorization - Visit Number 21    Authorization - Number of Visits 30    OT Start Time 1017    OT Stop Time 1100    OT Time Calculation (min) 43 min    Activity Tolerance Patient tolerated treatment well           Past Medical History:  Diagnosis Date  . Asthma   . CVA (cerebral vascular accident) ([place])   . Diabetes mellitus without complication [facility])     Past Surgical History:  Procedure Laterality Date  . IR ANGIO INTRA EXTRACRAN SEL COM CAROTID INNOMINATE BILAT MOD SED  09/15/2019  . IR ANGIO VERTEBRAL SEL VERTEBRAL UNI L MOD SED  09/15/2019    There were no vitals filed for this visit.   Subjective Assessment - 05/20/20 1020    Subjective  I fell in the bathroom but I didn't really get hurt    Patient is accompanied by: Family member   dtr   Pertinent History Pt s/p recent R ACA CVA 09/13/19. 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    Currently in Pain? No/denies                        OT Treatments/Exercises (OP) - 05/20/20 1221      Neurological Re-education Exercises   Other Exercises 2 Neuro re ed in supine to address  postural alignment and control, core activation, and to address L hip extension.  Pt is lateral flexed on L side with poor resolution of asymetry - pt also with small leg length discrepancy. Given this as well as functional malalignment of trunk, pt presents with functional leg length discrepancy with sit to stand, standing and functional ambulation. Pt and dtr report that pt has low back pain since stroke. Utilized 9MM lift on LLE for functional ambulation to practice toilet transfers using RW with orthosis - pt much more stable dynamically and reports that her "back feels better with that."  Pt needs max cues to attend to LLE and for more normalized step length for LLE however was able to complete toilet transfers with RW and hand orthosis with close supervision. Dtr to begin using RW with AFO at home for all toilet transfers.  PT informed.                     OT Short Term Goals - 05/01/20 0953      OT SHORT TERM GOAL #1   Title Pt and dtr will be mod  I with HEP to address ROM for RUE - 03/20/2020    Status Achieved  OT SHORT TERM GOAL #2   Title Pt and dtr will verbalize understanding for AE for cutting food on plate    Status Achieved      OT SHORT TERM GOAL #3   Title Pt will be mod I with washing face and set up for bushing teeth.    Status Achieved      OT SHORT TERM GOAL #4   Title Pt will be mod a for UB dressing    Status Achieved      OT SHORT TERM GOAL #5   Title Pt will be mod a for LB dressing    Status Achieved      OT SHORT TERM GOAL #6   Title Pt will require mod a to hike pants    Status Achieved      OT SHORT TERM GOAL #7   Title Pt and dtr will verbalize understanding of toileting schedule to address incontinence    Status Achieved      OT SHORT TERM GOAL #8   Title Pt and dtr will be mod with splint wear and care (resting hand splint at night)    Status Achieved      OT SHORT TERM GOAL  #9   TITLE Pt will demonstrate ability to set table and  wipe table (with dishes/silverware set on table) with supervision - 05/27/2020    Status On-going             OT Long Term Goals - 04/29/20 1049      OT LONG TERM GOAL #1   Title Pt and dtr will be mod I with upgraded home activities program to include focus ADL's, transitional movements and balance. - 06/24/2020    Status On-going      OT LONG TERM GOAL #2   Title Pt will be supervision for toilet transfers    Status Achieved      OT LONG TERM GOAL #3   Title Pt will be no more than min a for hiking pants and toilet hygiene    Status On-going      OT LONG TERM GOAL #4   Title Pt will tolerate 110* of L shoulder flexion and 90* abduction during HEP for ease with ADL's with more than " a little " pain based on faces.    Status Achieved      OT LONG TERM GOAL #5   Title Pt will be supervision for simple snack prep    Status On-going      OT LONG TERM GOAL #6   Title Pt and dtr will complete FOTO outcome scale    Status On-going                 Plan - 05/20/20 1226    Clinical Impression Statement Pt with continued slow progress toward goals. Pt now has AFO    OT Occupational Profile and History Detailed Assessment- Review of Records and additional review of physical, cognitive, psychosocial history related to current functional performance    Occupational performance deficits (Please refer to evaluation for details): ADL's;IADL's;Work;Leisure;Social Participation    Body Structure / Function / Physical Skills ADL;Balance;Continence;Endurance;GMC;IADL;Mobility;Sensation;ROM;Pain;Strength;Tone;UE functional use    Rehab Potential Good    Clinical Decision Making Multiple treatment options, significant modification of task necessary    Comorbidities Affecting Occupational Performance: Presence of comorbidities impacting occupational performance    Comorbidities impacting occupational performance description: see above    Modification or Assistance to Complete Evaluation   Max significant modification  of tasks or assist is necessary to complete    OT Frequency 2x / week    OT Duration 8 weeks    OT Treatment/Interventions Self-care/ADL training;Aquatic Therapy;Electrical Stimulation;Ultrasound;Moist Heat;Therapeutic exercise;Neuromuscular education;Manual Therapy;Functional Mobility Training;DME and/or AE instruction;Passive range of motion;Patient/family education;Cognitive remediation/compensation;Therapeutic activities;Splinting;Balance training    Plan NMR for LUE/trunk, postural alignment and control, balance and functional mobility,    Consulted and Agree with Plan of Care Patient           Patient will benefit from skilled therapeutic intervention in order to improve the following deficits and impairments:   Body Structure / Function / Physical Skills: ADL, Balance, Continence, Endurance, GMC, IADL, Mobility, Sensation, ROM, Pain, Strength, Tone, UE functional use       Visit Diagnosis: Muscle weakness (generalized)  Spastic hemiplegia of left nondominant side as late effect of cerebral infarction (HCC)  Other symptoms and signs involving cognitive functions following cerebral infarction  Other symptoms and signs involving the nervous system  Stiffness of left upper arm joint  Unsteadiness on feet  Abnormal posture  Pain in left arm    Problem List Patient Active Problem List   Diagnosis Date Noted  . Cerebrovascular accident (CVA) due to thrombosis of precerebral artery (HCC) 11/30/2019  . 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 Spastic hemiparesis affecting nondominant side (HCC) 11/30/2019  . Chronic bilateral low back pain with sciatica 11/30/2019  . Metatarsal fracture 09/22/2019  . Depression 09/22/2019  . Asthma 09/22/2019  . Palliative care by specialist   . Goals of care, counseling/discussion   . Encephalopathy   . Acute CVA (cerebrovascular accident) (HCC) 09/14/2019   . Acute arterial ischemic stroke, multifocal, anterior circulation, right (HCC) 09/14/2019  . Severe comorbid illness   . AMS (altered mental status) 09/13/2019  . 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  . Hyperlipidemia 09/02/2017  . Hypertension 09/02/2017  . Chest pain 09/02/2017    Norton Pastel, OTR/L 05/20/2020, 12:28 PM  [facility] [address] [place], [place], [address] Phone: [phone]   Fax:  [phone]  Name: Christina Evans MRN: [id number] Date of Birth: 1961/06/27

## 2020-05-22 ENCOUNTER — Ambulatory Visit: Payer: Medicare Other

## 2020-05-22 ENCOUNTER — Ambulatory Visit: Payer: Medicare Other | Admitting: Physical Therapy

## 2020-05-22 ENCOUNTER — Encounter: Payer: Self-pay | Admitting: Occupational Therapy

## 2020-05-22 ENCOUNTER — Ambulatory Visit: Payer: Medicare Other | Admitting: Occupational Therapy

## 2020-05-22 ENCOUNTER — Other Ambulatory Visit: Payer: Self-pay

## 2020-05-22 DIAGNOSIS — M79602 Pain in left arm: Secondary | ICD-10-CM

## 2020-05-22 DIAGNOSIS — R41841 Cognitive communication deficit: Secondary | ICD-10-CM | POA: Diagnosis not present

## 2020-05-22 DIAGNOSIS — M6281 Muscle weakness (generalized): Secondary | ICD-10-CM

## 2020-05-22 DIAGNOSIS — R293 Abnormal posture: Secondary | ICD-10-CM

## 2020-05-22 DIAGNOSIS — I69318 Other symptoms and signs involving cognitive functions following cerebral infarction: Secondary | ICD-10-CM

## 2020-05-22 DIAGNOSIS — R2681 Unsteadiness on feet: Secondary | ICD-10-CM

## 2020-05-22 DIAGNOSIS — I69354 Hemiplegia and hemiparesis following cerebral infarction affecting left non-dominant side: Secondary | ICD-10-CM

## 2020-05-22 DIAGNOSIS — R29818 Other symptoms and signs involving the nervous system: Secondary | ICD-10-CM

## 2020-05-22 DIAGNOSIS — R2689 Other abnormalities of gait and mobility: Secondary | ICD-10-CM

## 2020-05-22 DIAGNOSIS — M25622 Stiffness of left elbow, not elsewhere classified: Secondary | ICD-10-CM

## 2020-05-22 NOTE — Therapy (Signed)
[facility] [address] [place], [place], [address] Phone: [phone]   Fax:  [phone]  Physical Therapy Treatment  Patient Details  Name: Christina Evans MRN: [id number] Date of Birth: [birth year] Referring Provider (PT): Micki Riley, MD   Encounter Date: 05/22/2020   PT End of Session - 05/22/20 1114    Visit Number 24    Number of Visits 35    Date for PT Re-Evaluation 08/05/20   written for 60 day POC   Authorization Type UHC Medicare    Progress Note Due on Visit 29   did progress note on visit 19   PT Start Time 1016    PT Stop Time 1101    PT Time Calculation (min) 45 min    Activity Tolerance Patient tolerated treatment well    Behavior During Therapy [facility] for tasks assessed/performed           Past Medical History:  Diagnosis Date  . Asthma   . CVA (cerebral vascular accident) (HCC)   . Diabetes mellitus without complication [facility])     Past Surgical History:  Procedure Laterality Date  . IR ANGIO INTRA EXTRACRAN SEL COM CAROTID INNOMINATE BILAT MOD SED  09/15/2019  . IR ANGIO VERTEBRAL SEL VERTEBRAL UNI L MOD SED  09/15/2019    There were no vitals filed for this visit.   Subjective Assessment - 05/22/20 1023    Subjective No new falls. Wearing old sneakers today, new ones feel too tight.    Pertinent History PMH significant for large right MCA CVA, HTN, T2DM, HLD, hx of Left foot fifth metatarsal fracture with osteopenia, asthma.    How long can you stand comfortably? 1 minute (when daughter is changing her when she is wearing diapers).    How long can you walk comfortably? a few feet.    Diagnostic tests MRI brain with moderate sized acute to early subacute left ACA territory infarct andlarge chronic right MCA territory infarct.    Patient Stated Goals wants to get better, wants to be independent.    Currently in Pain? No/denies                             [facility]  Adult PT Treatment/Exercise - 05/22/20 0001      Transfers   Transfers Sit to Stand;Stand to Sit;Stand Pivot Transfers    Sit to Stand 4: Min guard    Sit to Stand Details Tactile cues for weight beaing;Verbal cues for technique    Sit to Stand Details (indicate cue type and reason) pt gently placing RUE on therapist's shoulder    Stand to Sit 4: Min assist    Stand to Sit Details (indicate cue type and reason) Verbal cues for technique    Stand to Sit Details cues for wider BOS and to not use hands when sitting    Stand Pivot Transfers 4: Min assist    Stand Pivot Transfer Details (indicate cue type and reason) w/c <> mat       Ambulation/Gait   Ambulation/Gait Yes    Ambulation/Gait Assistance 3: Mod assist    Ambulation/Gait Assistance Details donned 42mm with ace wrap bandaging to pt's LLE prior to standing activity and gait to address functional leg length discrepancy (see previous OT session). prior to gait training - performing standing lateral weight shifting x10 reps with verbal and tactile cues for weight shift. ambulated with RW with L  hand orthosis, mod A at pelvis to assist with L hip extension and to decr incr pelvic retraction, intermittent tactile and verbal cues throughout to relax RUE. verbal cues for incr step length with RLE.      Ambulation Distance (Feet) 115 Feet   x1, 60' x2   Assistive device Rolling walker   with L AFO, L hand orthosis   Gait Pattern Step-through pattern;Decreased hip/knee flexion - left;Decreased step length - right;Decreased step length - left    Ambulation Surface Level;Indoor      Therapeutic Activites    Therapeutic Activities Other Therapeutic Activities    Other Therapeutic Activities Pt coming into clinic today wearing her older sneakers (per pt her new sneakers are too tight), discussed importance of returning old shoes and purchasing a new pair based on orthotist recommended (stabilizing shoe with a roll bar to help decr pronation). Gave  info for PT employee at [employer] to help assist with new shoe. Daughter states they went to [employer] and they did not have a good experience. Discussed importance of purchasing a new shoe as soon as they can so potentially a 60mm lift can be added in able to assist with balance, postural alignment, and decr pt's back pain.  Daughter stating that it is still difficult to put on L AFO at home. Had daughter perform with minimal verbal and demo cues. Reviewed to loosen shoe fully first and then get forefoot in from side and bringing heel into brace and cueing pt to help push through leg as well as daughter pushing through pt's thigh. Reviewed wear schedule of AFO with pt and pt's daughter and importance and rationale of wearing it during any OOB activity and transfers, as pt currently only wearing to therapy. Continued to discuss importance of performing hip flexor stretch daily at home.                   PT Education - 05/22/20 1114    Education Details see TA    Person(s) Educated Patient;Child(ren)    Methods Explanation;Demonstration    Comprehension Verbalized understanding;Returned demonstration            PT Short Term Goals - 05/07/20 1428      PT SHORT TERM GOAL #1   Title Pt will receive L AFO for functional gait, mobility, and transfers. ALL STGS DUE 06/04/20    Time 4    Period Weeks    Status New    Target Date 06/04/20      PT SHORT TERM GOAL #2   Title Pt will perform stand step transfers with min guard consistently in order to decr caregiver burder.    Time 4    Period Weeks    Status New      PT SHORT TERM GOAL #3   Title Pt will ambulate with L AFO and LRAD with supervision for 115' in order to improve household mobility.    Time 4    Period Weeks    Status New      PT SHORT TERM GOAL #4   Title Pt will perform all bed mobility with supervision in order to decr caregiver burden.    Baseline needs min A for rolling to L and pushing up to sit    Time 4     Period Weeks    Status New      PT SHORT TERM GOAL #5   Title Pt will undergo assessment of gait speed when appropriate -  with LTG to be written    Baseline not yet assessed    Time 4    Period Weeks    Status New             PT Long Term Goals - 05/07/20 1402      PT LONG TERM GOAL #1   Title Pt will perform stand step transfers with no AD with mod I in order to decr caregiver burden. ALL LTGS DUE 07/02/20    Baseline --    Time 8    Period Weeks    Status New    Target Date 07/02/20      PT LONG TERM GOAL #2   Title Pt will tolerate static standing for at least 5 minutes with no UE support with supervision in order to improve tolerance for ADLs.    Baseline able to tolerate dynamic standing activities for 5 minutes for stepping with RLE with mod A from therapist    Time 8    Period Weeks    Status New      PT LONG TERM GOAL #3   Title Pt will ambulate at least 230' over level indoor/unlevel outdoor surfaces with LRAD with L AFO with min guard in order to improve functional mobility.    Time 8    Period Weeks    Status New      PT LONG TERM GOAL #4   Title Pt will perform at least 5 sit <> stand transfers from mat table and standard height chair with mod I with no UE support in order to demo improved functional transfers    Baseline --    Time 8    Period Weeks    Status On-going      PT LONG TERM GOAL #5   Title Pt and pt's daughter will be independent with final HEP for weight bearing/lower extremity stretching/strengthening.    Time 8    Period Weeks    Status On-going                Plan - 05/22/20 1148    Clinical Impression Statement Pt with improvements of L weight shift and RLE step length with gait with RW with use of 23mm lift under L shoe to address functional leg length discrepancy, pt also reporting her back felt better (previously would have back pain during gait). Continued to review wear schedule of L AFO and how to don with daughter and  continued to stress importance to have L AFO on for all transfers/gait and OOB activity. Will continue to progress towards LTGs.    Personal Factors and Comorbidities Comorbidity 3+;Past/Current Experience;Time since onset of injury/illness/exacerbation;Behavior Pattern    Comorbidities PMH significant for large right MCA CVA, HTN, T2DM, HLD, hx of Left foot fifth metatarsal fracture with osteopenia, asthma.    Examination-Activity Limitations Stand;Locomotion Level;Transfers;Toileting;Continence;Dressing    Examination-Participation Restrictions Community Activity    Stability/Clinical Decision Making Evolving/Moderate complexity    Rehab Potential Good    PT Frequency 2x / week    PT Duration 8 weeks    PT Treatment/Interventions ADLs/Self Care Home Management;Aquatic Therapy;Electrical Stimulation;DME Instruction;Gait training;Functional mobility training;Neuromuscular re-education;Balance training;Therapeutic exercise;Therapeutic activities;Patient/family education;Orthotic Fit/Training;Passive range of motion;Energy conservation    PT Next Visit Plan How is wear time going with new AFO? gait training with daughter with AFO for home. continue with stepping activities with RLE(with weight shifting towards LLE), gait training with no AD and with RW - with focus on facilitation for L  weight shift and hip extension during terminal stance.    Consulted and Agree with Plan of Care Patient;Family member/caregiver           Patient will benefit from skilled therapeutic intervention in order to improve the following deficits and impairments:  Abnormal gait, Decreased activity tolerance, Decreased coordination, Decreased balance, Decreased cognition, Decreased endurance, Decreased knowledge of use of DME, Decreased range of motion, Decreased strength, Difficulty walking, Impaired tone, Impaired UE functional use, Postural dysfunction, Impaired sensation  Visit Diagnosis: Muscle weakness  (generalized)  Other symptoms and signs involving the nervous system  Unsteadiness on feet  Abnormal posture  Other abnormalities of gait and mobility     Problem List Patient Active Problem List   Diagnosis Date Noted  . Cerebrovascular accident (CVA) due to thrombosis of precerebral artery (HCC) 11/30/2019  . 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 Spastic hemiparesis affecting nondominant side (HCC) 11/30/2019  . Chronic bilateral low back pain with sciatica 11/30/2019  . Metatarsal fracture 09/22/2019  . Depression 09/22/2019  . Asthma 09/22/2019  . Palliative care by specialist   . Goals of care, counseling/discussion   . Encephalopathy   . Acute CVA (cerebrovascular accident) (HCC) 09/14/2019  . Acute arterial ischemic stroke, multifocal, anterior circulation, right (HCC) 09/14/2019  . Severe comorbid illness   . AMS (altered mental status) 09/13/2019  . 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  . Hyperlipidemia 09/02/2017  . Hypertension 09/02/2017  . Chest pain 09/02/2017      Drake Leachhloe N Elfreida Heggs, PT, DPT  05/22/2020, 11:50 AM  [facility] [facility] [address] [place], [place], [address] Phone: [phone]   Fax:  [phone]  Name: Christina Evans MRN: [id number] Date of Birth: [birth year]

## 2020-05-22 NOTE — Therapy (Signed)
[facility] [address] [place], [place], [address] Phone: [phone]   Fax:  [phone]  Speech Language Pathology Treatment/Progress Note  Patient Details  Name: Christina Evans MRN: [id number] Date of Birth: 1961-03-28 Referring Provider (SLP): Antony Contras, MD   Encounter Date: 05/22/2020   End of Session - 05/22/20 2122    Visit Number 10    Number of Visits 17    Date for SLP Re-Evaluation 06/19/20    SLP Start Time 0848    SLP Stop Time  0930    SLP Time Calculation (min) 42 min    Activity Tolerance Other (comment);Patient tolerated treatment well   attention made session challenging          Past Medical History:  Diagnosis Date  . Asthma   . CVA (cerebral vascular accident) ([place])   . Diabetes mellitus without complication [facility])     Past Surgical History:  Procedure Laterality Date  . IR ANGIO INTRA EXTRACRAN SEL COM CAROTID INNOMINATE BILAT MOD SED  09/15/2019  . IR ANGIO VERTEBRAL SEL VERTEBRAL UNI L MOD SED  09/15/2019    There were no vitals filed for this visit.   Subjective Assessment - 05/22/20 2103    Subjective Pt and daughter arrived together.    Currently in Pain? No/denies                 ADULT SLP TREATMENT - 05/22/20 2103      General Information   Behavior/Cognition Cooperative;Distractible;Requires cueing;Decreased sustained attention      Treatment Provided   Treatment provided Cognitive-Linquistic      Cognitive-Linquistic Treatment   Treatment focused on Cognition    Skilled Treatment Simple basic attention task today of pt telling SLP her appointments in July 2021 with usual min-mod cues for focus to task. Pt continuously glancing out the window even though she was placed with her back to the window, and looking out the door of [place] room. Sustained, selective and alternating attention levels were all impaired, pt able to hold attention to task for average 23  seconds with wide range from 8 to 33 seconds in time until cue back to task was necessary.       Assessment / Recommendations / Plan   Plan Continue with current plan of care      Progression Toward Goals   Progression toward goals Not progressing toward goals (comment)   severity             SLP Short Term Goals - 05/16/20 0854      SLP SHORT TERM GOAL #1   Title pt will demonstrate attention appropriate to benefit from continued speech therapy over 4 sessions    Baseline 04-29-20, 05-01-20    Period --   or 9 visits, for all STGs   Status Partially Met      SLP SHORT TERM GOAL #2   Title pt will demo selective attention to simple linguistic task for 90 seconds with occasionl min A x 3 sessions    Baseline 04/23/20, 04-29-20    Status Achieved            SLP Long Term Goals - 05/22/20 2150      SLP LONG TERM GOAL #1   Title pt will demo sustained/selective attention for a simple 5 minute linguistic task with rare min A over 3 sessions    Time 3    Period Weeks   or 17 total sessions, for  all LTGS   Status On-going      SLP LONG TERM GOAL #2   Title pt will demonstrate attention appropriate for continued ST over 8 sessions    Baseline 05-01-20, 05-06-20    Time 3    Period Weeks    Status On-going            Plan - 05/22/20 2145    Clinical Impression Statement Pt presents with significant cognitive linguistic deficits stemming primarily from attentional deficits - ability to stay on topic was only slightly improved from previous session with this SLP --pt with circuitous and meandering topics during ST, See pt note today for more details. Daughter was present for first session in the last 4 sessions today; given severity of pt's deficits daughter's presence is necessary for any carryover at home. Pt would cont to benefit from skilled ST targeting primarily attention. Pt may well benefit from attention med if not already prescribed. If an attention med would be initiated,  re-evaluation using another cognitive insttrument may be necessary, and goals modified as needed. SLP will cont to monitor pt's appropriateness for ST, given her attention deficits. If pt cont to show decr'd atteniton necessary for efficacious ST she may be put on hold or d/c'd until appropriate.    Speech Therapy Frequency 2x / week    Duration --   8 weeks, or 17 total sessions   Treatment/Interventions Cognitive reorganization;Internal/external aids;Patient/family education;Compensatory strategies;SLP instruction and feedback;Cueing hierarchy;Functional tasks;Environmental controls;Multimodal communcation approach    Potential to Achieve Goals Fair    Potential Considerations Severity of impairments;Cooperation/participation level;Ability to learn/carryover information           Patient will benefit from skilled therapeutic intervention in order to improve the following deficits and impairments:   Cognitive communication deficit   Speech Therapy Progress Note  Dates of Reporting Period: 03-21-20 to present  Subjective Statement: Pt has been seen for 10 sessions focusing on her attention. She recently began an attention med, with so far, limited affect.  Objective Measurements:  Pt requires cues back to task ranging from between 8 seconds to ~35 seconds.   Goal Update:  See above.  Plan:  If pt attention continues as ineffective for ST treatment she will be put on hold or d/c'd   Reason Skilled Services are Required:  SLP will continue to monitor if pt attention abilities are adequate for ST.   Problem List Patient Active Problem List   Diagnosis Date Noted  . Cerebrovascular accident (CVA) due to thrombosis of precerebral artery ([place]) 11/30/2019  . 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 Spastic hemiparesis affecting nondominant side ([place]) 11/30/2019  . Chronic bilateral low back pain with sciatica 11/30/2019  . Metatarsal  fracture 09/22/2019  . Depression 09/22/2019  . Asthma 09/22/2019  . Palliative care by specialist   . Goals of care, counseling/discussion   . Encephalopathy   . Acute CVA (cerebrovascular accident) ([place]) 09/14/2019  . Acute arterial ischemic stroke, multifocal, anterior circulation, right ([place]) 09/14/2019  . Severe comorbid illness   . AMS (altered mental status) 09/13/2019  . Controlled diabetes mellitus type 2 with complications ([place]) 31/51/7616  . Hyperlipidemia 09/02/2017  . Hypertension 09/02/2017  . Chest pain 09/02/2017    [facility] ,[place], [place]  05/22/2020, 9:52 PM  [facility] [address] [place], [place], [address] Phone: [phone]   Fax:  [phone]   Name: Christina Evans MRN: [id number]  Date of Birth: 1961/04/10

## 2020-05-22 NOTE — Therapy (Signed)
[facility] [address] [place], [place], [address] Phone: [phone]   Fax:  [phone]  Occupational Therapy Treatment  Patient Details  Name: Christina Evans MRN: [id number] Date of Birth: 1961/07/21 Referring Provider (OT): Dr. Pearlean Brownie   Encounter Date: 05/22/2020   OT End of Session - 05/22/20 1220    Visit Number 22    Number of Visits 31    Date for OT Re-Evaluation 06/24/20    Authorization Type [employer] medicare and medicaid as secondary. Pt will need PN every 10th visit    Authorization Time Period 90 days - 07/22/2020 (renwewed on 04/29/2020)    Authorization - Visit Number 22    Authorization - Number of Visits 30    OT Start Time 0933    OT Stop Time 1015    OT Time Calculation (min) 42 min    Activity Tolerance Patient tolerated treatment well           Past Medical History:  Diagnosis Date  . Asthma   . CVA (cerebral vascular accident) ([facility])   . Diabetes mellitus without complication [facility])     Past Surgical History:  Procedure Laterality Date  . IR ANGIO INTRA EXTRACRAN SEL COM CAROTID INNOMINATE BILAT MOD SED  09/15/2019  . IR ANGIO VERTEBRAL SEL VERTEBRAL UNI L MOD SED  09/15/2019    There were no vitals filed for this visit.   Subjective Assessment - 05/22/20 0934    Subjective  I guess I can move my arm some    Pertinent History Pt s/p recent R ACA CVA 09/13/19. 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    Currently in Pain? No/denies                        OT Treatments/Exercises (OP) - 05/22/20 1208      Neurological Re-education Exercises   Other Exercises 1 Neuro re ed in supine to address isolated facilitated movement for shoulder flexion/extension, ab/adduction,  IR/ER , elbow extension, wrist flexion and extension. Progressed to bilateral overhead reach in closed chain using PVC square - all activities with moderate facilitation due to tone, poor sensation, L neglect and apraxia as well as max cues due to attentional deficits. Pt does have isolated movement in all planes.  Transitioned into sidelying L side for weight bearing, trunk mobilization and activity into lateral flexion and elongation. Moved to prop on L elbow with reaching activity with RUE to activate trunk as well as proximal stability and stable mobility/decrease spasticity.  Transitoned into sitting and addressed closed chain at 90* for active forward reaching with place and hold with manual resistance.        Manual Therapy   Manual Therapy Joint mobilization;Soft tissue mobilization;Scapular mobilization    Manual therapy comments joint, soft tissue and scap mob to address L shoulder girdle alignment in preparation for neuro re ed and to reduce spasticity.                      OT Short Term Goals - 05/22/20 1218      OT SHORT TERM GOAL #1   Title Pt and dtr will be mod  I with HEP to address ROM for RUE - 03/20/2020  Status Achieved      OT SHORT TERM GOAL #2   Title Pt and dtr will verbalize understanding for AE for cutting food on plate    Status Achieved      OT SHORT TERM GOAL #3   Title Pt will be mod I with washing face and set up for bushing teeth.    Status Achieved      OT SHORT TERM GOAL #4   Title Pt will be mod a for UB dressing    Status Achieved      OT SHORT TERM GOAL #5   Title Pt will be mod a for LB dressing    Status Achieved      OT SHORT TERM GOAL #6   Title Pt will require mod a to hike pants    Status Achieved      OT SHORT TERM GOAL #7   Title Pt and dtr will verbalize understanding of toileting schedule to address incontinence    Status Achieved      OT SHORT TERM GOAL #8   Title Pt and dtr will be mod with splint wear and care  (resting hand splint at night)    Status Achieved      OT SHORT TERM GOAL  #9   TITLE Pt will demonstrate ability to set table and wipe table (with dishes/silverware set on table) with supervision - 05/27/2020    Status On-going             OT Long Term Goals - 05/22/20 1219      OT LONG TERM GOAL #1   Title Pt and dtr will be mod I with upgraded home activities program to include focus ADL's, transitional movements and balance. - 06/24/2020    Status On-going      OT LONG TERM GOAL #2   Title Pt will be supervision for toilet transfers    Status Achieved      OT LONG TERM GOAL #3   Title Pt will be no more than min a for hiking pants and toilet hygiene    Status Achieved      OT LONG TERM GOAL #4   Title Pt will tolerate 110* of L shoulder flexion and 90* abduction during HEP for ease with ADL's with more than " a little " pain based on faces.    Status Achieved      OT LONG TERM GOAL #5   Title Pt will be supervision for simple snack prep    Status On-going      OT LONG TERM GOAL #6   Title Pt and dtr will complete FOTO outcome scale    Status On-going                 Plan - 05/22/20 1219    Clinical Impression Statement Pt with slowly improving isolated movement in LUE. Pt encouraged to wear AFO consistently as dtr reports pt has been taking if off.    OT Occupational Profile and History Detailed Assessment- Review of Records and additional review of physical, cognitive, psychosocial history related to current functional performance    Occupational performance deficits (Please refer to evaluation for details): ADL's;IADL's;Work;Leisure;Social Participation    Body Structure / Function / Physical Skills ADL;Balance;Continence;Endurance;GMC;IADL;Mobility;Sensation;ROM;Pain;Strength;Tone;UE functional use    Rehab Potential Good    Clinical Decision Making Multiple treatment options, significant modification of task necessary    Comorbidities Affecting Occupational  Performance: Presence of comorbidities impacting occupational performance    Comorbidities  impacting occupational performance description: see above    Modification or Assistance to Complete Evaluation  Max significant modification of tasks or assist is necessary to complete    OT Frequency 2x / week    OT Duration 8 weeks    OT Treatment/Interventions Self-care/ADL training;Aquatic Therapy;Electrical Stimulation;Ultrasound;Moist Heat;Therapeutic exercise;Neuromuscular education;Manual Therapy;Functional Mobility Training;DME and/or AE instruction;Passive range of motion;Patient/family education;Cognitive remediation/compensation;Therapeutic activities;Splinting;Balance training    Plan NMR for LUE/trunk, postural alignment and control, balance and functional mobility,    Consulted and Agree with Plan of Care Patient;Family member/caregiver    Family Member Consulted dtr           Patient will benefit from skilled therapeutic intervention in order to improve the following deficits and impairments:   Body Structure / Function / Physical Skills: ADL, Balance, Continence, Endurance, GMC, IADL, Mobility, Sensation, ROM, Pain, Strength, Tone, UE functional use       Visit Diagnosis: Muscle weakness (generalized)  Spastic hemiplegia of left nondominant side as late effect of cerebral infarction (HCC)  Other symptoms and signs involving cognitive functions following cerebral infarction  Other symptoms and signs involving the nervous system  Stiffness of left upper arm joint  Unsteadiness on feet  Abnormal posture  Pain in left arm    Problem List Patient Active Problem List   Diagnosis Date Noted  . Cerebrovascular accident (CVA) due to thrombosis of precerebral artery ([facility]) 11/30/2019  . 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 Spastic hemiparesis affecting nondominant side (HCC) 11/30/2019  . Chronic bilateral low back pain  with sciatica 11/30/2019  . Metatarsal fracture 09/22/2019  . Depression 09/22/2019  . Asthma 09/22/2019  . Palliative care by specialist   . Goals of care, counseling/discussion   . Encephalopathy   . Acute CVA (cerebrovascular accident) ([facility]) 09/14/2019  . Acute arterial ischemic stroke, multifocal, anterior circulation, right ([facility]) 09/14/2019  . Severe comorbid illness   . AMS (altered mental status) 09/13/2019  . 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  . Hyperlipidemia 09/02/2017  . Hypertension 09/02/2017  . Chest pain 09/02/2017    Norton Pastel, OTR/L 05/22/2020, 12:22 PM  [facility] [address] [place], [place], [address] Phone: [phone]   Fax:  [phone]  Name: Christina Evans MRN: [id number] Date of Birth: 05/11/1961

## 2020-05-23 ENCOUNTER — Encounter: Payer: Medicare Other | Admitting: Speech Pathology

## 2020-05-24 ENCOUNTER — Ambulatory Visit: Payer: Medicare Other

## 2020-05-28 ENCOUNTER — Ambulatory Visit: Payer: Medicare Other | Admitting: Speech Pathology

## 2020-05-28 ENCOUNTER — Ambulatory Visit: Payer: Medicare Other | Attending: Neurology | Admitting: Physical Therapy

## 2020-05-28 ENCOUNTER — Telehealth: Payer: Self-pay | Admitting: Physical Therapy

## 2020-05-28 ENCOUNTER — Other Ambulatory Visit (HOSPITAL_COMMUNITY): Payer: Self-pay | Admitting: Internal Medicine

## 2020-05-28 DIAGNOSIS — I69354 Hemiplegia and hemiparesis following cerebral infarction affecting left non-dominant side: Secondary | ICD-10-CM | POA: Insufficient documentation

## 2020-05-28 DIAGNOSIS — M25622 Stiffness of left elbow, not elsewhere classified: Secondary | ICD-10-CM | POA: Insufficient documentation

## 2020-05-28 DIAGNOSIS — R29818 Other symptoms and signs involving the nervous system: Secondary | ICD-10-CM | POA: Insufficient documentation

## 2020-05-28 DIAGNOSIS — R41841 Cognitive communication deficit: Secondary | ICD-10-CM | POA: Insufficient documentation

## 2020-05-28 DIAGNOSIS — R2681 Unsteadiness on feet: Secondary | ICD-10-CM | POA: Insufficient documentation

## 2020-05-28 DIAGNOSIS — M6281 Muscle weakness (generalized): Secondary | ICD-10-CM | POA: Insufficient documentation

## 2020-05-28 DIAGNOSIS — R2689 Other abnormalities of gait and mobility: Secondary | ICD-10-CM | POA: Insufficient documentation

## 2020-05-28 DIAGNOSIS — R293 Abnormal posture: Secondary | ICD-10-CM | POA: Insufficient documentation

## 2020-05-28 MED FILL — CITALOPRAM HBR 40 MG TABLET: 40 | 90 days supply | Qty: 90 | Fill #0

## 2020-05-28 MED FILL — ATORVASTATIN 80 MG TABLET: 80 | 90 days supply | Qty: 90 | Fill #0

## 2020-05-28 NOTE — Telephone Encounter (Signed)
Called pt's daughter regarding no show appointment for PT and ST this morning on 05/28/20. Unable to reach. Left voicemail.  Sherlie Ban, PT, DPT 05/28/20 11:05 AM

## 2020-05-29 ENCOUNTER — Other Ambulatory Visit: Payer: Self-pay | Admitting: Neurology

## 2020-05-30 ENCOUNTER — Ambulatory Visit: Payer: Medicare Other | Admitting: Physical Therapy

## 2020-05-30 ENCOUNTER — Ambulatory Visit: Payer: Medicare Other | Admitting: Speech Pathology

## 2020-05-30 ENCOUNTER — Other Ambulatory Visit: Payer: Self-pay

## 2020-05-30 ENCOUNTER — Encounter: Payer: Self-pay | Admitting: Physical Therapy

## 2020-05-30 DIAGNOSIS — R41841 Cognitive communication deficit: Secondary | ICD-10-CM

## 2020-05-30 DIAGNOSIS — M6281 Muscle weakness (generalized): Secondary | ICD-10-CM | POA: Diagnosis not present

## 2020-05-30 DIAGNOSIS — R29818 Other symptoms and signs involving the nervous system: Secondary | ICD-10-CM | POA: Diagnosis present

## 2020-05-30 DIAGNOSIS — M25622 Stiffness of left elbow, not elsewhere classified: Secondary | ICD-10-CM | POA: Diagnosis present

## 2020-05-30 DIAGNOSIS — R2689 Other abnormalities of gait and mobility: Secondary | ICD-10-CM

## 2020-05-30 DIAGNOSIS — I69354 Hemiplegia and hemiparesis following cerebral infarction affecting left non-dominant side: Secondary | ICD-10-CM | POA: Diagnosis present

## 2020-05-30 DIAGNOSIS — R2681 Unsteadiness on feet: Secondary | ICD-10-CM

## 2020-05-30 DIAGNOSIS — R293 Abnormal posture: Secondary | ICD-10-CM

## 2020-05-30 NOTE — Therapy (Signed)
[facility] [address] [place], [place], [address] Phone: [phone]   Fax:  [phone]  Speech Language Pathology Treatment  Patient Details  Name: Christina Evans MRN: [id number] Date of Birth: 01/01/1961 Referring Provider (SLP): Antony Contras, MD   Encounter Date: 05/30/2020   End of Session - 05/30/20 1313    Visit Number 11    Number of Visits 17    Date for SLP Re-Evaluation 06/19/20    SLP Start Time 0930    SLP Stop Time  9417    SLP Time Calculation (min) 45 min    Activity Tolerance Patient tolerated treatment well           Past Medical History:  Diagnosis Date  . Asthma   . CVA (cerebral vascular accident) ([place])   . Diabetes mellitus without complication [facility])     Past Surgical History:  Procedure Laterality Date  . IR ANGIO INTRA EXTRACRAN SEL COM CAROTID INNOMINATE BILAT MOD SED  09/15/2019  . IR ANGIO VERTEBRAL SEL VERTEBRAL UNI L MOD SED  09/15/2019    There were no vitals filed for this visit.   Subjective Assessment - 05/30/20 0932    Subjective Daughter present for session with patient.    Patient is accompained by: Family member   daughter present   Currently in Pain? No/denies                 ADULT SLP TREATMENT - 05/30/20 0933      General Information   Behavior/Cognition Cooperative;Distractible;Requires cueing;Decreased sustained attention      Treatment Provided   Treatment provided Cognitive-Linquistic      Pain Assessment   Pain Assessment No/denies pain      Cognitive-Linquistic Treatment   Treatment focused on Cognition    Skilled Treatment Patient missed on Tuesday because daughter was sick. SLP informed of no-show policy for future sessions. Daughter reports doing cognitive activities (worksheets) and reviewing calendar with pt daily. "She still loses her thoughts sometimes." Pt looking around the room, out the window throughout session. Daughter  reports pt resistance to following toileting schedule and daily routine. SLP worked with daughter to generate first a written schedule; when pt asked to review this she was unable to read this, functionally. SLP modified to visual schedule with icons representing toileting times, mealtimes, medications, and cleaning her room. Pt able to write current date on schedule and check off completed with mod A. SLP reinforced need for consistency at home if pt to make functional improvements.       Assessment / Recommendations / Plan   Plan Continue with current plan of care      Progression Toward Goals   Progression toward goals Not progressing toward goals (comment)   severity           SLP Education - 05/30/20 1313    Education Details use of visual schedule at home    Person(s) Educated Patient;Child(ren)    Methods Explanation;Demonstration    Comprehension Verbalized understanding;Need further instruction            SLP Short Term Goals - 05/30/20 1317      SLP SHORT TERM GOAL #1   Title pt will demonstrate attention appropriate to benefit from continued speech therapy over 4 sessions    Baseline 04-29-20, 05-01-20    Period --   or 9 visits, for all STGs   Status Partially Met      SLP SHORT TERM GOAL #  2   Title pt will demo selective attention to simple linguistic task for 90 seconds with occasionl min A x 3 sessions    Baseline 04/23/20, 04-29-20    Status Achieved            SLP Long Term Goals - 05/30/20 1317      SLP LONG TERM GOAL #1   Title pt will demo sustained/selective attention for a simple 5 minute linguistic task with rare min A over 3 sessions    Time 2    Period Weeks   or 17 total sessions, for all LTGS   Status On-going      SLP LONG TERM GOAL #2   Title pt will demonstrate attention appropriate for continued ST over 8 sessions    Baseline 05-01-20, 05-06-20    Time 2    Period Weeks    Status On-going            Plan - 05/30/20 1315    Clinical  Impression Statement Pt presents with significant cognitive linguistic deficits stemming primarily from attentional deficits - ability to stay on topic was only slightly improved from previous session with this SLP --pt with circuitous and meandering topics during ST, See pt note today for more details. Daughter was present for 2nd session in a row today after SLP educated re: need for her attendance for any carryover at home. Pt would cont to benefit from skilled ST targeting primarily attention. Patient has started attention med but thus far appears to have had minimal impact on pt's attention, functionally. SLP will cont to monitor pt's appropriateness for ST, given her attention deficits. If pt cont to show decr'd atteniton necessary for efficacious ST she may be put on hold or d/c'd until appropriate.    Speech Therapy Frequency 2x / week    Duration --   8 weeks, or 17 total sessions   Treatment/Interventions Cognitive reorganization;Internal/external aids;Patient/family education;Compensatory strategies;SLP instruction and feedback;Cueing hierarchy;Functional tasks;Environmental controls;Multimodal communcation approach    Potential to Achieve Goals Fair    Potential Considerations Severity of impairments;Cooperation/participation level;Ability to learn/carryover information           Patient will benefit from skilled therapeutic intervention in order to improve the following deficits and impairments:   Cognitive communication deficit    Problem List Patient Active Problem List   Diagnosis Date Noted  . Cerebrovascular accident (CVA) due to thrombosis of precerebral artery ([place]) 11/30/2019  . 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 Spastic hemiparesis affecting nondominant side ([place]) 11/30/2019  . Chronic bilateral low back pain with sciatica 11/30/2019  . Metatarsal fracture 09/22/2019  . Depression 09/22/2019  . Asthma 09/22/2019    . Palliative care by specialist   . Goals of care, counseling/discussion   . Encephalopathy   . Acute CVA (cerebrovascular accident) ([place]) 09/14/2019  . Acute arterial ischemic stroke, multifocal, anterior circulation, right ([place]) 09/14/2019  . Severe comorbid illness   . AMS (altered mental status) 09/13/2019  . Controlled diabetes mellitus type 2 with complications ([place]) 76/54/6503  . Hyperlipidemia 09/02/2017  . Hypertension 09/02/2017  . Chest pain 09/02/2017   Deneise Lever, [place], CCC-SLP Speech-Language Pathologist  Aliene Altes 05/30/2020, 1:18 PM  [facility] [address] [place], [place], [address] Phone: [phone]   Fax:  [phone]   Name: Ryann Pauli MRN: [id number] Date of Birth: Aug 21, 1961

## 2020-05-31 NOTE — Therapy (Signed)
[facility] [address] [place], [place], [address] Phone: [phone]   Fax:  [phone]  Physical Therapy Treatment  Patient Details  Name: Christina Evans MRN: [id number] Date of Birth: Jan 16, 1961 Referring Provider (PT): Micki Riley, MD   Encounter Date: 05/30/2020   PT End of Session - 05/30/20 0854    Visit Number 25    Number of Visits 35    Date for PT Re-Evaluation 08/05/20   written for 60 day POC   Authorization Type [employer] Medicare    Progress Note Due on Visit 29   did progress note on visit 19   PT Start Time 0850    PT Stop Time 0930    PT Time Calculation (min) 40 min    Equipment Utilized During Treatment Gait belt;Other (comment)   shoe build up on left side   Activity Tolerance Patient tolerated treatment well    Behavior During Therapy WFL for tasks assessed/performed           Past Medical History:  Diagnosis Date  . Asthma   . CVA (cerebral vascular accident) (HCC)   . Diabetes mellitus without complication [facility])     Past Surgical History:  Procedure Laterality Date  . IR ANGIO INTRA EXTRACRAN SEL COM CAROTID INNOMINATE BILAT MOD SED  09/15/2019  . IR ANGIO VERTEBRAL SEL VERTEBRAL UNI L MOD SED  09/15/2019    There were no vitals filed for this visit.   Subjective Assessment - 05/30/20 0854    Subjective No new complaitns. No falls or pain. Has not been able to get new shoes due to finacial reasons.    Patient is accompained by: Family member    Pertinent History PMH significant for large right MCA CVA, HTN, T2DM, HLD, hx of Left foot fifth metatarsal fracture with osteopenia, asthma.    Limitations Standing;Walking    How long can you stand comfortably? 1 minute (when daughter is changing her when she is wearing diapers).    How long can you walk comfortably? a few feet.    Diagnostic tests MRI brain with moderate sized acute to early subacute left ACA territory infarct  andlarge chronic right MCA territory infarct.    Patient Stated Goals wants to get better, wants to be independent.    Currently in Pain? No/denies    Pain Score 0-No pain                  [facility] Adult PT Treatment/Exercise - 05/30/20 0855      Transfers   Transfers Sit to Stand;Stand to Sit;Stand Pivot Transfers    Sit to Stand 4: Min guard;With upper extremity assist;From chair/3-in-1    Stand to Sit 4: Min guard;With upper extremity assist;To chair/3-in-1      Ambulation/Gait   Ambulation/Gait Yes    Ambulation/Gait Assistance 4: Min assist;3: Mod assist    Ambulation/Gait Assistance Details with 9 mm lift secured to shoe via coban with shoe cover over it. cues for increased left step length, increased left stance time and cues/faciliation to decreased left hip protraction with gait, especially with swing phase of gait. use of walker with hand orthotic with 1st lap with up to mod assist needed for balance and walker negotiation. second lap had pt hold onto PTA shoulder with right UE with PTA in front of pt working on posture, weight shifting and pelvic alignment.      Ambulation Distance (Feet) 115 Feet   x2 reps  Assistive device Rolling walker;Other (Comment)   left UE orthotic, left brace   Gait Pattern Step-through pattern;Decreased hip/knee flexion - left;Decreased step length - right;Decreased step length - left    Ambulation Surface Level;Indoor      High Level Balance   High Level Balance Activities Side stepping    High Level Balance Comments in parallel bars with single UE support for 3 laps toward each side, min assist with cues/facilitation for correct posture, ex form and technique.       Neuro Re-ed    Neuro Re-ed Details  for balance/muscle re-ed/left LE weight bearing: with 6 inch box- right foot taps up/down for 10 reps with emphasis on posture and up to min assist needed for balance; with rocker board in ant/post direction had pt holding board steady for  alternating UE raises for 10 each side, limited range noted on left side due to weakness. then holding the board steady with use of mirror for feedback worked on finding midline with equal LE weight bearing with single UE support, progressing to no UE support. holding this for max of 15 seconds before pt leans toward right again, performed in bloc practice. min assist for balance.                 PT Short Term Goals - 05/07/20 1428      PT SHORT TERM GOAL #1   Title Pt will receive L AFO for functional gait, mobility, and transfers. ALL STGS DUE 06/04/20    Time 4    Period Weeks    Status New    Target Date 06/04/20      PT SHORT TERM GOAL #2   Title Pt will perform stand step transfers with min guard consistently in order to decr caregiver burder.    Time 4    Period Weeks    Status New      PT SHORT TERM GOAL #3   Title Pt will ambulate with L AFO and LRAD with supervision for 115' in order to improve household mobility.    Time 4    Period Weeks    Status New      PT SHORT TERM GOAL #4   Title Pt will perform all bed mobility with supervision in order to decr caregiver burden.    Baseline needs min A for rolling to L and pushing up to sit    Time 4    Period Weeks    Status New      PT SHORT TERM GOAL #5   Title Pt will undergo assessment of gait speed when appropriate - with LTG to be written    Baseline not yet assessed    Time 4    Period Weeks    Status New             PT Long Term Goals - 05/07/20 1402      PT LONG TERM GOAL #1   Title Pt will perform stand step transfers with no AD with mod I in order to decr caregiver burden. ALL LTGS DUE 07/02/20    Baseline --    Time 8    Period Weeks    Status New    Target Date 07/02/20      PT LONG TERM GOAL #2   Title Pt will tolerate static standing for at least 5 minutes with no UE support with supervision in order to improve tolerance for ADLs.    Baseline able to tolerate dynamic  standing activities  for 5 minutes for stepping with RLE with mod A from therapist    Time 8    Period Weeks    Status New      PT LONG TERM GOAL #3   Title Pt will ambulate at least 230' over level indoor/unlevel outdoor surfaces with LRAD with L AFO with min guard in order to improve functional mobility.    Time 8    Period Weeks    Status New      PT LONG TERM GOAL #4   Title Pt will perform at least 5 sit <> stand transfers from mat table and standard height chair with mod I with no UE support in order to demo improved functional transfers    Baseline --    Time 8    Period Weeks    Status On-going      PT LONG TERM GOAL #5   Title Pt and pt's daughter will be independent with final HEP for weight bearing/lower extremity stretching/strengthening.    Time 8    Period Weeks    Status On-going                 Plan - 05/30/20 0854    Clinical Impression Statement Today's skilled session continued to focus on gait with shoe build up on left/AFO on left with emphasis on correction of posture, equal step length and equal stance time. Pt needs further practice with this. Remainder of session focused on balance reations with emphasis on increased left LE weight bearing. No issues other than fatigue reported, with rest breaks taken as needed. The pt is progressing toward goals and should beneft from continued PT to progress toward unmet goals.    Personal Factors and Comorbidities Comorbidity 3+;Past/Current Experience;Time since onset of injury/illness/exacerbation;Behavior Pattern    Comorbidities PMH significant for large right MCA CVA, HTN, T2DM, HLD, hx of Left foot fifth metatarsal fracture with osteopenia, asthma.    Examination-Activity Limitations Stand;Locomotion Level;Transfers;Toileting;Continence;Dressing    Examination-Participation Restrictions Community Activity    Stability/Clinical Decision Making Evolving/Moderate complexity    Rehab Potential Good    PT Frequency 2x / week    PT  Duration 8 weeks    PT Treatment/Interventions ADLs/Self Care Home Management;Aquatic Therapy;Electrical Stimulation;DME Instruction;Gait training;Functional mobility training;Neuromuscular re-education;Balance training;Therapeutic exercise;Therapeutic activities;Patient/family education;Orthotic Fit/Training;Passive range of motion;Energy conservation    PT Next Visit Plan How is wear time going with new AFO? gait training with daughter with AFO for home. continue with stepping activities with RLE(with weight shifting towards LLE), gait training with no AD and with RW - with focus on facilitation for L weight shift and hip extension during terminal stance.    Consulted and Agree with Plan of Care Patient;Family member/caregiver           Patient will benefit from skilled therapeutic intervention in order to improve the following deficits and impairments:  Abnormal gait, Decreased activity tolerance, Decreased coordination, Decreased balance, Decreased cognition, Decreased endurance, Decreased knowledge of use of DME, Decreased range of motion, Decreased strength, Difficulty walking, Impaired tone, Impaired UE functional use, Postural dysfunction, Impaired sensation  Visit Diagnosis: Muscle weakness (generalized)  Other symptoms and signs involving the nervous system  Unsteadiness on feet  Abnormal posture  Other abnormalities of gait and mobility     Problem List Patient Active Problem List   Diagnosis Date Noted  . Cerebrovascular accident (CVA) due to thrombosis of precerebral artery (HCC) 11/30/2019  . 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 Spastic hemiparesis affecting nondominant side (HCC) 11/30/2019  . Chronic bilateral low back pain with sciatica 11/30/2019  . Metatarsal fracture 09/22/2019  . Depression 09/22/2019  . Asthma 09/22/2019  . Palliative care by specialist   . Goals of care, counseling/discussion   .  Encephalopathy   . Acute CVA (cerebrovascular accident) (HCC) 09/14/2019  . Acute arterial ischemic stroke, multifocal, anterior circulation, right (HCC) 09/14/2019  . Severe comorbid illness   . AMS (altered mental status) 09/13/2019  . 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  . Hyperlipidemia 09/02/2017  . Hypertension 09/02/2017  . Chest pain 09/02/2017   Sallyanne Kuster, PTA, [facility] [address] [place], [place] [address] [phone] 05/31/20, 8:28 AM   Name: Christina Evans MRN: [id number] Date of Birth: 1961/06/01

## 2020-06-04 ENCOUNTER — Ambulatory Visit: Payer: Medicare Other

## 2020-06-04 ENCOUNTER — Other Ambulatory Visit: Payer: Self-pay

## 2020-06-04 ENCOUNTER — Encounter: Payer: Self-pay | Admitting: Occupational Therapy

## 2020-06-04 ENCOUNTER — Ambulatory Visit: Payer: Medicare Other | Admitting: Speech Pathology

## 2020-06-04 ENCOUNTER — Ambulatory Visit: Payer: Medicare Other | Admitting: Occupational Therapy

## 2020-06-04 DIAGNOSIS — M25622 Stiffness of left elbow, not elsewhere classified: Secondary | ICD-10-CM

## 2020-06-04 DIAGNOSIS — I69354 Hemiplegia and hemiparesis following cerebral infarction affecting left non-dominant side: Secondary | ICD-10-CM

## 2020-06-04 DIAGNOSIS — R41841 Cognitive communication deficit: Secondary | ICD-10-CM

## 2020-06-04 DIAGNOSIS — R2689 Other abnormalities of gait and mobility: Secondary | ICD-10-CM

## 2020-06-04 DIAGNOSIS — M6281 Muscle weakness (generalized): Secondary | ICD-10-CM

## 2020-06-04 DIAGNOSIS — R293 Abnormal posture: Secondary | ICD-10-CM

## 2020-06-04 DIAGNOSIS — R29818 Other symptoms and signs involving the nervous system: Secondary | ICD-10-CM

## 2020-06-04 NOTE — Therapy (Signed)
[facility] [address] [place], [place], [address] Phone: [phone]   Fax:  [phone]  Occupational Therapy Treatment  Patient Details  Name: Christina Evans MRN: [id number] Date of Birth: [birth year] Referring Provider (OT): Dr. Pearlean Brownie   Encounter Date: 06/04/2020   OT End of Session - 06/04/20 0820    Visit Number 23    Number of Visits 31    Date for OT Re-Evaluation 06/24/20    Authorization Type [employer]HC medicare and medicaid as secondary. Pt will need PN every 10th visit    Authorization Time Period 90 days - 07/22/2020 (renwewed on 04/29/2020)    Authorization - Visit Number 23    Authorization - Number of Visits 30    OT Start Time 0804    OT Stop Time 0842    OT Time Calculation (min) 38 min    Activity Tolerance Patient tolerated treatment well           Past Medical History:  Diagnosis Date  . Asthma   . CVA (cerebral vascular accident) (HCC)   . Diabetes mellitus without complication [facility])     Past Surgical History:  Procedure Laterality Date  . IR ANGIO INTRA EXTRACRAN SEL COM CAROTID INNOMINATE BILAT MOD SED  09/15/2019  . IR ANGIO VERTEBRAL SEL VERTEBRAL UNI L MOD SED  09/15/2019    There were no vitals filed for this visit.   Subjective Assessment - 06/04/20 0820    Subjective  denies pain    Pertinent History Pt s/p recent R ACA CVA 09/13/19. 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    Currently in Pain? No/denies               Treatment:supine passive stretch to LUE with scapular mobs in sidelying. Supine lower trunk rotation, followed by Upper trunk rotation, chopping, min-mod facilitation. Self ROM shoulder flexion, min facilitation/ v.c Seated weightbearing through LUE  with body on arm movements, then low range shoulder flexion with UE ranger , mod facilitation/ v.c                   OT Short Term Goals - 05/22/20 1218      OT SHORT TERM GOAL #1   Title Pt and dtr will be mod  I with HEP to address ROM for RUE - 03/20/2020    Status Achieved      OT SHORT TERM GOAL #2   Title Pt and dtr will verbalize understanding for AE for cutting food on plate    Status Achieved      OT SHORT TERM GOAL #3   Title Pt will be mod I with washing face and set up for bushing teeth.    Status Achieved      OT SHORT TERM GOAL #4   Title Pt will be mod a for UB dressing    Status Achieved      OT SHORT TERM GOAL #5   Title Pt will be mod a for LB dressing    Status Achieved      OT SHORT TERM GOAL #6   Title Pt will require mod a to hike pants    Status Achieved      OT SHORT TERM GOAL #7   Title Pt and  dtr will verbalize understanding of toileting schedule to address incontinence    Status Achieved      OT SHORT TERM GOAL #8   Title Pt and dtr will be mod with splint wear and care (resting hand splint at night)    Status Achieved      OT SHORT TERM GOAL  #9   TITLE Pt will demonstrate ability to set table and wipe table (with dishes/silverware set on table) with supervision - 05/27/2020    Status On-going             OT Long Term Goals - 05/22/20 1219      OT LONG TERM GOAL #1   Title Pt and dtr will be mod I with upgraded home activities program to include focus ADL's, transitional movements and balance. - 06/24/2020    Status On-going      OT LONG TERM GOAL #2   Title Pt will be supervision for toilet transfers    Status Achieved      OT LONG TERM GOAL #3   Title Pt will be no more than min a for hiking pants and toilet hygiene    Status Achieved      OT LONG TERM GOAL #4   Title Pt will tolerate 110* of L shoulder flexion and 90* abduction during HEP for ease with ADL's with more than " a little " pain based on faces.     Status Achieved      OT LONG TERM GOAL #5   Title Pt will be supervision for simple snack prep    Status On-going      OT LONG TERM GOAL #6   Title Pt and dtr will complete FOTO outcome scale    Status On-going                 Plan - 06/04/20 1617    Clinical Impression Statement Pt is progressing slowly towards goals. Pt's LUE was really tight and stiff today. Pt reports she has not been exercising consistently.    OT Occupational Profile and History Detailed Assessment- Review of Records and additional review of physical, cognitive, psychosocial history related to current functional performance    Occupational performance deficits (Please refer to evaluation for details): ADL's;IADL's;Work;Leisure;Social Participation    Body Structure / Function / Physical Skills ADL;Balance;Continence;Endurance;GMC;IADL;Mobility;Sensation;ROM;Pain;Strength;Tone;UE functional use    Rehab Potential Good    Clinical Decision Making Multiple treatment options, significant modification of task necessary    Comorbidities Affecting Occupational Performance: Presence of comorbidities impacting occupational performance    Comorbidities impacting occupational performance description: see above    Modification or Assistance to Complete Evaluation  Max significant modification of tasks or assist is necessary to complete    OT Frequency 2x / week    OT Duration 8 weeks    OT Treatment/Interventions Self-care/ADL training;Aquatic Therapy;Electrical Stimulation;Ultrasound;Moist Heat;Therapeutic exercise;Neuromuscular education;Manual Therapy;Functional Mobility Training;DME and/or AE instruction;Passive range of motion;Patient/family education;Cognitive remediation/compensation;Therapeutic activities;Splinting;Balance training    Plan NMR for LUE/trunk, postural alignment and control, balance and functional mobility,    Consulted and Agree with Plan of Care Patient;Family member/caregiver    Family Member  Consulted dtr           Patient will benefit from skilled therapeutic intervention in order to improve the following deficits and impairments:   Body Structure / Function / Physical Skills: ADL, Balance, Continence, Endurance, GMC, IADL, Mobility, Sensation, ROM, Pain, Strength, Tone, UE functional use       Visit Diagnosis:  Muscle weakness (generalized)  Other symptoms and signs involving the nervous system  Abnormal posture  Spastic hemiplegia of left nondominant side as late effect of cerebral infarction (HCC)  Stiffness of left upper arm joint    Problem List Patient Active Problem List   Diagnosis Date Noted  . Cerebrovascular accident (CVA) due to thrombosis of precerebral artery (HCC) 11/30/2019  . 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 Spastic hemiparesis affecting nondominant side (HCC) 11/30/2019  . Chronic bilateral low back pain with sciatica 11/30/2019  . Metatarsal fracture 09/22/2019  . Depression 09/22/2019  . Asthma 09/22/2019  . Palliative care by specialist   . Goals of care, counseling/discussion   . Encephalopathy   . Acute CVA (cerebrovascular accident) (HCC) 09/14/2019  . Acute arterial ischemic stroke, multifocal, anterior circulation, right (HCC) 09/14/2019  . Severe comorbid illness   . AMS (altered mental status) 09/13/2019  . 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  . Hyperlipidemia 09/02/2017  . Hypertension 09/02/2017  . Chest pain 09/02/2017    Eyvonne Burchfield 06/04/2020, 4:18 PM  [facility] [address] [place], [place], [address] Phone: [phone]   Fax:  [phone]  Name: Christina Evans MRN: [id number] Date of Birth: February 15, 1961

## 2020-06-04 NOTE — Patient Instructions (Signed)
Get an egg timer from [employer] or [employer].   For getting dressed, show her the "get dressed" picture on the schedule 1. Turn off the TV and take the tablet out of the bedroom. 2. Set the timer for 10 minutes. 3. Lay out clothes in front of your mom.  4. Help her start the first step. 5. When the timer goes off, ask if she's ready.   With medicine Try giving medicines whole in a spoonful of yogurt or pudding. One large pill per bite, no more than 2-3 small pills per bite. Check with your doctor to see if it's OK to break your metformin in half.

## 2020-06-04 NOTE — Therapy (Addendum)
[facility] [address] [place], [place], [address] Phone: [phone]   Fax:  [phone]  Physical Therapy Treatment  Patient Details  Name: Christina Evans MRN: [id number] Date of Birth: 1961/06/08 Referring Provider (PT): Garvin Fila, MD   Encounter Date: 06/04/2020   PT End of Session - 06/04/20 0941    Visit Number 26    Number of Visits 35    Date for PT Re-Evaluation 08/05/20   written for 60 day POC   Authorization Type [employer]    Progress Note Due on Visit 29   did progress note on visit 78   PT Start Time 0935    PT Stop Time 1018    PT Time Calculation (min) 43 min    Equipment Utilized During Treatment Gait belt;Other (comment)   shoe build up on left side   Activity Tolerance Patient tolerated treatment well    Behavior During Therapy WFL for tasks assessed/performed           Past Medical History:  Diagnosis Date  . Asthma   . CVA (cerebral vascular accident) ([place])   . Diabetes mellitus without complication [facility])     Past Surgical History:  Procedure Laterality Date  . IR ANGIO INTRA EXTRACRAN SEL COM CAROTID INNOMINATE BILAT MOD SED  09/15/2019  . IR ANGIO VERTEBRAL SEL VERTEBRAL UNI L MOD SED  09/15/2019    There were no vitals filed for this visit.   Subjective Assessment - 06/04/20 0939    Subjective Pt's daughter did take her to [employer]. Showed PT a picture of a [facility] shoe and they will have to make an appointment with the PT there for fitting.    Patient is accompained by: Family member    Pertinent History PMH significant for large right MCA CVA, HTN, T2DM, HLD, hx of Left foot fifth metatarsal fracture with osteopenia, asthma.    Limitations Standing;Walking    How long can you stand comfortably? 1 minute (when daughter is changing her when she is wearing diapers).    How long can you walk comfortably? a few feet.    Diagnostic tests MRI brain with moderate sized  acute to early subacute left ACA territory infarct andlarge chronic right MCA territory infarct.    Patient Stated Goals wants to get better, wants to be independent.    Currently in Pain? No/denies                             [facility] Adult PT Treatment/Exercise - 06/04/20 0946      Bed Mobility   Bed Mobility Rolling Right;Rolling Left;Right Sidelying to Sit;Supine to Sit    Rolling Right Supervision/verbal cueing    Rolling Left Supervision/Verbal cueing    Right Sidelying to Sit Supervision/Verbal cueing    Sit to Supine Supervision/Verbal cueing      Transfers   Transfers Sit to Stand;Stand to Sit;Stand Pivot Transfers    Sit to Stand 5: Supervision    Sit to Stand Details Verbal cues for technique    Sit to Stand Details (indicate cue type and reason) from w/c and mat    Stand to Sit 5: Supervision    Stand Pivot Transfers 5: Supervision      Ambulation/Gait   Ambulation/Gait Yes    Ambulation/Gait Assistance 4: Min guard;4: Min assist    Ambulation/Gait Assistance Details 21m shoe lift under left foot held on with  coban and shoe cover to allow to slide easier. PT facilitated at pelvis to try to get more left anterior pelvic rotation. Also had to readjust left hand a few times as fingers not staying in place on grip attachment as well. Pt was cued to try to bring left leg straight through from hip as tends to ER and hits back leg of walker at times.    Ambulation Distance (Feet) 230 Feet    Assistive device Rolling walker   left hand grip attachment, left AFO   Gait Pattern Step-through pattern;Decreased stance time - left;Decreased step length - left;Decreased hip/knee flexion - left;Trunk rotated posteriorly on left    Ambulation Surface Level;Indoor    Gait velocity 27.13 sec over 20'=0.16ms    Pre-Gait Activities Standing with RUE on PT shoulder tapping 2" step x 10 with RLE with PT preventing posterior rotation of pelvis on left then repeated with 4" step.  Pt initially tentative to step foot but improved as went on and able to slow down. Pt has 2 steps at other daughter's house with no railing so instructed 1 daughter to be in front for her to hold to arm and one behind for safety.                  PT Education - 06/04/20 1118    Education Details PT gave pt's daughter note with what would need shoe specialist to look at including shoe that would fit AFO that would help to decrease her pronation. Daughter doing to email the PT that works at the shoe store to set up meeting. Discussed walking more at home with daughter using walker in hallway. Gave tennis balls.    Person(s) Educated Patient;Child(ren)    Methods Explanation;Demonstration    Comprehension Verbalized understanding            PT Short Term Goals - 06/04/20 0942      PT SHORT TERM GOAL #1   Title Pt will receive L AFO for functional gait, mobility, and transfers. ALL STGS DUE 06/04/20    Baseline Pt has received left AFO    Time 4    Period Weeks    Status Achieved    Target Date 06/04/20      PT SHORT TERM GOAL #2   Title Pt will perform stand step transfers with min guard consistently in order to decr caregiver burder.    Baseline stand/pivot w/c to mat supervision. Daughter reports she is only standing by for safety at home with transfers.    Time 4    Period Weeks    Status Achieved      PT SHORT TERM GOAL #3   Title Pt will ambulate with L AFO and LRAD with supervision for 115' in order to improve household mobility.    Baseline CGA/min assist with gait with RW with left hand grip attachment and left AFO    Time 4    Period Weeks    Status Partially Met      PT SHORT TERM GOAL #4   Title Pt will perform all bed mobility with supervision in order to decr caregiver burden.    Baseline needs min A for rolling to L and pushing up to sit, 06/04/20 rolling and sit to/from supine supervision    Time 4    Period Weeks    Status Achieved      PT SHORT TERM  GOAL #5   Title Pt will undergo assessment of gait speed  when appropriate - with LTG to be written    Baseline assessed on 06/04/20 at was 0.9ms    Time 4    Period Weeks    Status Achieved             PT Long Term Goals - 06/04/20 1134      PT LONG TERM GOAL #1   Title Pt will perform stand step transfers with no AD with mod I in order to decr caregiver burden. ALL LTGS DUE 07/02/20    Time 8    Period Weeks    Status New      PT LONG TERM GOAL #2   Title Pt will tolerate static standing for at least 5 minutes with no UE support with supervision in order to improve tolerance for ADLs.    Baseline able to tolerate dynamic standing activities for 5 minutes for stepping with RLE with mod A from therapist    Time 8    Period Weeks    Status New      PT LONG TERM GOAL #3   Title Pt will ambulate at least 230' over level indoor/unlevel outdoor surfaces with LRAD with L AFO with min guard in order to improve functional mobility.    Time 8    Period Weeks    Status New      PT LONG TERM GOAL #4   Title Pt will perform at least 5 sit <> stand transfers from mat table and standard height chair with mod I with no UE support in order to demo improved functional transfers    Time 8    Period Weeks    Status On-going      PT LONG TERM GOAL #5   Title Pt and pt's daughter will be independent with final HEP for weight bearing/lower extremity stretching/strengthening.    Time 8    Period Weeks    Status On-going      Additional Long Term Goals   Additional Long Term Goals Yes      PT LONG TERM GOAL #6   Title Pt will increase gait speed from 0.277m to >0.3225mfor improved gait safety and mobility.    Baseline 0.72m44mn 06/04/20    Time 8    Period Weeks    Status New    Target Date 07/02/20                 Plan - 06/04/20 1120    Clinical Impression Statement PT assessed STGs today. Pt met 4/5 goals. She is showing improving safety with transfers but still needs  supervision. Pt has been able to progress gait with new AFO but still CGA/min assist for safety and for form. PT working on trying to get left shoe built up as this does help decreasd pelvic drop due to functional leg length descrepancy on left. PT was able to assess gait speed today and was 0.72m/42mich is decreased safety for household ambulator. Pt will continue to benefit from skilled PT to continue to address strength, balance and functional mobility deficits.    Personal Factors and Comorbidities Comorbidity 3+;Past/Current Experience;Time since onset of injury/illness/exacerbation;Behavior Pattern    Comorbidities PMH significant for large right MCA CVA, HTN, T2DM, HLD, hx of Left foot fifth metatarsal fracture with osteopenia, asthma.    Examination-Activity Limitations Stand;Locomotion Level;Transfers;Toileting;Continence;Dressing    Examination-Participation Restrictions Community Activity    Stability/Clinical Decision Making Evolving/Moderate complexity    Rehab Potential Good    PT Frequency  2x / week    PT Duration 8 weeks    PT Treatment/Interventions ADLs/Self Care Home Management;Aquatic Therapy;Electrical Stimulation;DME Instruction;Gait training;Functional mobility training;Neuromuscular re-education;Balance training;Therapeutic exercise;Therapeutic activities;Patient/family education;Orthotic Fit/Training;Passive range of motion;Energy conservation    PT Next Visit Plan Did they see the PT at [facility]? Daughter was going to schedule appointment. gait training with daughter with AFO for home. continue with stepping activities with RLE(with weight shifting towards LLE), gait training with no AD and with RW - with focus on facilitation for L weight shift and hip extension during terminal stance.    Consulted and Agree with Plan of Care Patient;Family member/caregiver           Patient will benefit from skilled therapeutic intervention in order to improve the following deficits  and impairments:  Abnormal gait, Decreased activity tolerance, Decreased coordination, Decreased balance, Decreased cognition, Decreased endurance, Decreased knowledge of use of DME, Decreased range of motion, Decreased strength, Difficulty walking, Impaired tone, Impaired UE functional use, Postural dysfunction, Impaired sensation  Visit Diagnosis: Other abnormalities of gait and mobility  Muscle weakness (generalized)     Problem List Patient Active Problem List   Diagnosis Date Noted  . Cerebrovascular accident (CVA) due to thrombosis of precerebral artery ([place]) 11/30/2019  . 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 Spastic hemiparesis affecting nondominant side ([place]) 11/30/2019  . Chronic bilateral low back pain with sciatica 11/30/2019  . Metatarsal fracture 09/22/2019  . Depression 09/22/2019  . Asthma 09/22/2019  . Palliative care by specialist   . Goals of care, counseling/discussion   . Encephalopathy   . Acute CVA (cerebrovascular accident) ([place]) 09/14/2019  . Acute arterial ischemic stroke, multifocal, anterior circulation, right ([place]) 09/14/2019  . Severe comorbid illness   . AMS (altered mental status) 09/13/2019  . Controlled diabetes mellitus type 2 with complications ([place]) 27/61/4709  . Hyperlipidemia 09/02/2017  . Hypertension 09/02/2017  . Chest pain 09/02/2017    Electa Sniff, PT, DPT, NCS 06/04/2020, 11:35 AM  [facility] [address] [place], [place], [address] Phone: [phone]   Fax:  [phone]  Name: Christina Evans MRN: [id number] Date of Birth: [birth year]

## 2020-06-04 NOTE — Therapy (Signed)
[facility] [address] [place], [place], [address] Phone: [phone]   Fax:  [phone]  Speech Language Pathology Treatment  Patient Details  Name: Christina Evans MRN: [id number] Date of Birth: 09-07-1961 Referring Provider (SLP): Antony Contras, MD   Encounter Date: 06/04/2020   End of Session - 06/04/20 1310    Visit Number 12    Number of Visits 17    Date for SLP Re-Evaluation 06/19/20    SLP Start Time 0845    SLP Stop Time  0925    SLP Time Calculation (min) 40 min    Activity Tolerance Patient tolerated treatment well           Past Medical History:  Diagnosis Date  . Asthma   . CVA (cerebral vascular accident) ([place])   . Diabetes mellitus without complication [facility])     Past Surgical History:  Procedure Laterality Date  . IR ANGIO INTRA EXTRACRAN SEL COM CAROTID INNOMINATE BILAT MOD SED  09/15/2019  . IR ANGIO VERTEBRAL SEL VERTEBRAL UNI L MOD SED  09/15/2019    There were no vitals filed for this visit.   Subjective Assessment - 06/04/20 0847    Subjective "How was your weekend?"    Patient is accompained by: Family member   daughter   Currently in Pain? No/denies                 ADULT SLP TREATMENT - 06/04/20 1305      General Information   Behavior/Cognition Cooperative;Distractible;Requires cueing;Decreased sustained attention      Treatment Provided   Treatment provided Cognitive-Linquistic      Pain Assessment   Pain Assessment No/denies pain      Cognitive-Linquistic Treatment   Treatment focused on Cognition;Patient/family/caregiver education    Skilled Treatment Daughter again present with patient today. She has been using schedule at home with patient; "I fell off for a few days but I started again yesterday." We discussed pt's progress in therapy and that attention continues to limit her ability to progress in sessions and with tasks at home. She has MD follow-up  in 2 weeks, per daughter; she plans to ask about increasing pt's attention med. SLP worked with daughter today to ID strategies/routines to help pt complete activities such as getting dressed. See pt instructions for details. Throughout session pt looked out the window, even once blinds were closed. Plan is to check in re: use of routines next session with probable therapy hold for 4 weeks if no significant changes noted in attention/participation.        Assessment / Recommendations / Plan   Plan Continue with current plan of care   probable therapy hold for 4 weeks after next session     Progression Toward Goals   Progression toward goals Not progressing toward goals (comment)   severity of attention deficits           SLP Education - 06/04/20 1309    Education Details reduce distractions, use of timer, assisting pt with first step to initiate an automatic sequence    Person(s) Educated Patient;Child(ren)    Methods Explanation;Handout    Comprehension Verbalized understanding            SLP Short Term Goals - 06/04/20 1310      SLP SHORT TERM GOAL #1   Title pt will demonstrate attention appropriate to benefit from continued speech therapy over 4 sessions    Baseline 04-29-20, 05-01-20  Period --   or 9 visits, for all STGs   Status Partially Met      SLP SHORT TERM GOAL #2   Title pt will demo selective attention to simple linguistic task for 90 seconds with occasionl min A x 3 sessions    Baseline 04/23/20, 04-29-20    Status Achieved            SLP Long Term Goals - 06/04/20 1310      SLP LONG TERM GOAL #1   Title pt will demo sustained/selective attention for a simple 5 minute linguistic task with rare min A over 3 sessions    Time 1    Period Weeks   or 17 total sessions, for all LTGS   Status On-going      SLP LONG TERM GOAL #2   Title pt will demonstrate attention appropriate for continued ST over 8 sessions    Baseline 05-01-20, 05-06-20    Time 1    Period Weeks     Status On-going            Plan - 06/04/20 1311    Clinical Impression Statement Pt presents with significant cognitive linguistic deficits stemming primarily from attentional deficits - relatively unchanged over last 2 sessions. Consider therapy hold for 4 weeks if not significant changes noted next session. See pt note today for more details. Daughter was present for 3rd session in a row today after SLP educated re: need for her attendance for any carryover at home. Pt would cont to benefit from skilled ST targeting primarily attention. Patient has started attention med but thus far appears to have had minimal impact on pt's attention, functionally. SLP will cont to monitor pt's appropriateness for ST, given her attention deficits. If pt cont to show decr'd attention necessary for efficacious ST she may be put on hold or d/c'd until appropriate.    Speech Therapy Frequency 2x / week    Duration --   8 weeks, or 17 total sessions   Treatment/Interventions Cognitive reorganization;Internal/external aids;Patient/family education;Compensatory strategies;SLP instruction and feedback;Cueing hierarchy;Functional tasks;Environmental controls;Multimodal communcation approach    Potential to Achieve Goals Fair    Potential Considerations Severity of impairments;Cooperation/participation level;Ability to learn/carryover information           Patient will benefit from skilled therapeutic intervention in order to improve the following deficits and impairments:   Cognitive communication deficit    Problem List Patient Active Problem List   Diagnosis Date Noted  . Cerebrovascular accident (CVA) due to thrombosis of precerebral artery ([place]) 11/30/2019  . 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 Spastic hemiparesis affecting nondominant side ([place]) 11/30/2019  . Chronic bilateral low back pain with sciatica 11/30/2019  . Metatarsal fracture  09/22/2019  . Depression 09/22/2019  . Asthma 09/22/2019  . Palliative care by specialist   . Goals of care, counseling/discussion   . Encephalopathy   . Acute CVA (cerebrovascular accident) ([place]) 09/14/2019  . Acute arterial ischemic stroke, multifocal, anterior circulation, right ([place]) 09/14/2019  . Severe comorbid illness   . AMS (altered mental status) 09/13/2019  . Controlled diabetes mellitus type 2 with complications ([place]) 82/70/7867  . Hyperlipidemia 09/02/2017  . Hypertension 09/02/2017  . Chest pain 09/02/2017   Deneise Lever, [place], [place] 06/04/2020, 1:13 PM  [facility] [address] [place], [place], [address] Phone: [phone]   Fax:  [phone]  Name: Christina Evans MRN: [id number] Date of Birth: 1961/04/08

## 2020-06-06 ENCOUNTER — Ambulatory Visit: Payer: Medicare Other | Admitting: Occupational Therapy

## 2020-06-06 ENCOUNTER — Ambulatory Visit: Payer: Medicare Other | Admitting: Physical Therapy

## 2020-06-06 ENCOUNTER — Ambulatory Visit: Payer: Medicare Other | Admitting: Speech Pathology

## 2020-06-11 ENCOUNTER — Ambulatory Visit: Payer: Medicare Other

## 2020-06-11 ENCOUNTER — Other Ambulatory Visit: Payer: Self-pay

## 2020-06-11 ENCOUNTER — Ambulatory Visit: Payer: Medicare Other | Admitting: Occupational Therapy

## 2020-06-11 ENCOUNTER — Encounter: Payer: Self-pay | Admitting: Occupational Therapy

## 2020-06-11 ENCOUNTER — Ambulatory Visit: Payer: Medicare Other | Admitting: Speech Pathology

## 2020-06-11 VITALS — BP 150/86

## 2020-06-11 DIAGNOSIS — R29818 Other symptoms and signs involving the nervous system: Secondary | ICD-10-CM

## 2020-06-11 DIAGNOSIS — R41841 Cognitive communication deficit: Secondary | ICD-10-CM

## 2020-06-11 DIAGNOSIS — M25622 Stiffness of left elbow, not elsewhere classified: Secondary | ICD-10-CM

## 2020-06-11 DIAGNOSIS — I69354 Hemiplegia and hemiparesis following cerebral infarction affecting left non-dominant side: Secondary | ICD-10-CM

## 2020-06-11 DIAGNOSIS — M6281 Muscle weakness (generalized): Secondary | ICD-10-CM | POA: Diagnosis not present

## 2020-06-11 DIAGNOSIS — R2689 Other abnormalities of gait and mobility: Secondary | ICD-10-CM

## 2020-06-11 DIAGNOSIS — R293 Abnormal posture: Secondary | ICD-10-CM

## 2020-06-11 NOTE — Patient Instructions (Signed)
When you ask a question and your mom responds incorrectly (answering a different question than the one you asked), that is likely due to ATTENTION. -Make the environment less distracting (turn off the TV, put the dog away, go in a different room away from the nieces or to a quieter place, close the blinds, turn off music or the radio).  -Ask the question again, and ask her to tell the question back to you in her own words (this helps you know if she understands your question).    If she's trying to answer a question but can't remember what she did, that is due to MEMORY. You can use the schedule with her to help her remember. I also suggest having her take a picture on her phone or tablet of important events (birthday party, maybe the outside of a doctor's office or therapy building, picture of where you went you went shopping).

## 2020-06-11 NOTE — Therapy (Signed)
[facility] [address] [place], [place], [address] Phone: [phone]   Fax:  [phone]  Physical Therapy Treatment  Patient Details  Name: Christina Evans MRN: [id number] Date of Birth: May 04, 1961 Referring Provider (PT): Garvin Fila, MD   Encounter Date: 06/11/2020   PT End of Session - 06/11/20 0934    Visit Number 27    Number of Visits 35    Date for PT Re-Evaluation 08/05/20   written for 60 day POC   Authorization Type [employer]    Progress Note Due on Visit 29   did progress note on visit 19   PT Start Time 0929    PT Stop Time 1018    PT Time Calculation (min) 49 min    Equipment Utilized During Treatment Gait belt;Other (comment)   shoe build up on left side   Activity Tolerance Patient tolerated treatment well    Behavior During Therapy WFL for tasks assessed/performed           Past Medical History:  Diagnosis Date   Asthma    CVA (cerebral vascular accident) ([place])    Diabetes mellitus without complication ([place])     Past Surgical History:  Procedure Laterality Date   IR ANGIO INTRA EXTRACRAN SEL COM CAROTID INNOMINATE BILAT MOD SED  09/15/2019   IR ANGIO VERTEBRAL SEL VERTEBRAL UNI L MOD SED  09/15/2019    Vitals:   06/11/20 0936  BP: (!) 150/86     Subjective Assessment - 06/11/20 0934    Subjective Pt's daughter reports that she did email the guy from [employer] but has not heard back. She will go down there if does not hear soon. Pt reports that she has not done any walking over the weekend and was just laying around. Pt reporting a lot of personal family issues going on as well.    Patient is accompained by: Family member    Pertinent History PMH significant for large right MCA CVA, HTN, T2DM, HLD, hx of Left foot fifth metatarsal fracture with osteopenia, asthma.    Limitations Standing;Walking    How long can you stand comfortably? 1 minute (when daughter is changing  her when she is wearing diapers).    How long can you walk comfortably? a few feet.    Diagnostic tests MRI brain with moderate sized acute to early subacute left ACA territory infarct andlarge chronic right MCA territory infarct.    Patient Stated Goals wants to get better, wants to be independent.    Currently in Pain? Yes    Pain Location Head    Pain Orientation Anterior    Pain Descriptors / Indicators Headache    Pain Type Acute pain    Pain Frequency Constant                             [facility] Adult PT Treatment/Exercise - 06/11/20 0939      Ambulation/Gait   Ambulation/Gait Yes    Ambulation/Gait Assistance 4: Min guard;4: Min assist    Ambulation/Gait Assistance Details 86m shoe lift under left foot held on with coban and shoe cover to allow to slide easier. First bout pt utilized RW with left hand grip attachment. Hand kept sliding off even with attachment and PT had to hold hand in place. Switched to hemiwalker just to see as this is what pt utilizing at home. Pt able to perform close SBA but  PT provided tactile cues at pelvis to try to facilitate more anterior pelvic rotation on left and verbal cues to increase right step length. Pt seems slowed down by hemiwalker. Switched to [facility] with quad tip last bout and much improved reciprocal pattern. Pt did well with sequencing. PT again provided tactile cues at pelvis to help with rotation and weight shift.    Ambulation Distance (Feet) 115 Feet   115' x 2   Assistive device Rolling walker;Hemi-walker;Straight cane    Gait Pattern Step-through pattern;Decreased step length - left;Decreased stance time - left;Decreased hip/knee flexion - left;Trunk rotated posteriorly on left    Ambulation Surface Level;Indoor      Exercises   Exercises Other Exercises    Other Exercises  Left hip flexor stretch off edge of table 30 sec x 3 with overpressure from PT. Pt reported feeling some pulling in back and was advised to tighten  tummy to get back flat which helped. Bridges 10 x 2 with verbal cues for form. Pt denied any headache after exercises.                  PT Education - 06/11/20 1411    Education Details Discussed getting SPC and quad tip and showed daughter options online for purchase.    Person(s) Educated Patient;Child(ren)    Methods Explanation;Demonstration    Comprehension Verbalized understanding            PT Short Term Goals - 06/04/20 0942      PT SHORT TERM GOAL #1   Title Pt will receive L AFO for functional gait, mobility, and transfers. ALL STGS DUE 06/04/20    Baseline Pt has received left AFO    Time 4    Period Weeks    Status Achieved    Target Date 06/04/20      PT SHORT TERM GOAL #2   Title Pt will perform stand step transfers with min guard consistently in order to decr caregiver burder.    Baseline stand/pivot w/c to mat supervision. Daughter reports she is only standing by for safety at home with transfers.    Time 4    Period Weeks    Status Achieved      PT SHORT TERM GOAL #3   Title Pt will ambulate with L AFO and LRAD with supervision for 115' in order to improve household mobility.    Baseline CGA/min assist with gait with RW with left hand grip attachment and left AFO    Time 4    Period Weeks    Status Partially Met      PT SHORT TERM GOAL #4   Title Pt will perform all bed mobility with supervision in order to decr caregiver burden.    Baseline needs min A for rolling to L and pushing up to sit, 06/04/20 rolling and sit to/from supine supervision    Time 4    Period Weeks    Status Achieved      PT SHORT TERM GOAL #5   Title Pt will undergo assessment of gait speed when appropriate - with LTG to be written    Baseline assessed on 06/04/20 at was 0.110ms    Time 4    Period Weeks    Status Achieved             PT Long Term Goals - 06/04/20 1134      PT LONG TERM GOAL #1   Title Pt will perform stand step transfers with  no AD with mod I in  order to decr caregiver burden. ALL LTGS DUE 07/02/20    Time 8    Period Weeks    Status New      PT LONG TERM GOAL #2   Title Pt will tolerate static standing for at least 5 minutes with no UE support with supervision in order to improve tolerance for ADLs.    Baseline able to tolerate dynamic standing activities for 5 minutes for stepping with RLE with mod A from therapist    Time 8    Period Weeks    Status New      PT LONG TERM GOAL #3   Title Pt will ambulate at least 230' over level indoor/unlevel outdoor surfaces with LRAD with L AFO with min guard in order to improve functional mobility.    Time 8    Period Weeks    Status New      PT LONG TERM GOAL #4   Title Pt will perform at least 5 sit <> stand transfers from mat table and standard height chair with mod I with no UE support in order to demo improved functional transfers    Time 8    Period Weeks    Status On-going      PT LONG TERM GOAL #5   Title Pt and pt's daughter will be independent with final HEP for weight bearing/lower extremity stretching/strengthening.    Time 8    Period Weeks    Status On-going      Additional Long Term Goals   Additional Long Term Goals Yes      PT LONG TERM GOAL #6   Title Pt will increase gait speed from 0.50ms to >0.359m for improved gait safety and mobility.    Baseline 0.2237mon 06/04/20    Time 8    Period Weeks    Status New    Target Date 07/02/20                 Plan - 06/11/20 1411    Clinical Impression Statement PT trialed different devices with patient today to walk with. Pt currently using hemiwalker at home. She did best with SPC with quad tip in session. Continues to show improvement in left weight shift and does well with 9mm48mft on shoe.    Personal Factors and Comorbidities Comorbidity 3+;Past/Current Experience;Time since onset of injury/illness/exacerbation;Behavior Pattern    Comorbidities PMH significant for large right MCA CVA, HTN, T2DM, HLD,  hx of Left foot fifth metatarsal fracture with osteopenia, asthma.    Examination-Activity Limitations Stand;Locomotion Level;Transfers;Toileting;Continence;Dressing    Examination-Participation Restrictions Community Activity    Stability/Clinical Decision Making Evolving/Moderate complexity    Rehab Potential Good    PT Frequency 2x / week    PT Duration 8 weeks    PT Treatment/Interventions ADLs/Self Care Home Management;Aquatic Therapy;Electrical Stimulation;DME Instruction;Gait training;Functional mobility training;Neuromuscular re-education;Balance training;Therapeutic exercise;Therapeutic activities;Patient/family education;Orthotic Fit/Training;Passive range of motion;Energy conservation    PT Next Visit Plan [facility]. gait training with daughter with AFO for home. continue with stepping activities with RLE(with weight shifting towards LLE), gait training with no AD and with SPC with quad tip - with focus on facilitation for L weight shift and hip extension during terminal stance.    Consulted and Agree with Plan of Care Patient;Family member/caregiver           Patient will benefit from skilled therapeutic  intervention in order to improve the following deficits and impairments:  Abnormal gait, Decreased activity tolerance, Decreased coordination, Decreased balance, Decreased cognition, Decreased endurance, Decreased knowledge of use of DME, Decreased range of motion, Decreased strength, Difficulty walking, Impaired tone, Impaired UE functional use, Postural dysfunction, Impaired sensation  Visit Diagnosis: Other abnormalities of gait and mobility  Muscle weakness (generalized)     Problem List Patient Active Problem List   Diagnosis Date Noted   Cerebrovascular accident (CVA) due to thrombosis of precerebral artery ([place]) 11/30/2019   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 11/30/2019   Spastic hemiparesis affecting nondominant side ([place]) 11/30/2019   Chronic bilateral low back pain with sciatica 11/30/2019   Metatarsal fracture 09/22/2019   Depression 09/22/2019   Asthma 09/22/2019   Palliative care by specialist    Goals of care, counseling/discussion    Encephalopathy    Acute CVA (cerebrovascular accident) ([place]) 09/14/2019   Acute arterial ischemic stroke, multifocal, anterior circulation, right ([place]) 09/14/2019   Severe comorbid illness    AMS (altered mental status) 09/13/2019   Controlled diabetes mellitus type 2 with complications ([place]) 44/61/9012   Hyperlipidemia 09/02/2017   Hypertension 09/02/2017   Chest pain 09/02/2017    Electa Sniff, PT, DPT, NCS 06/11/2020, 2:13 PM  [facility] [address] [place], [place], [address] Phone: [phone]   Fax:  [phone]  Name: Clemmie Buelna MRN: [id number] Date of Birth: Jul 08, 1961

## 2020-06-11 NOTE — Therapy (Signed)
[facility] [address] [place], [place], [address] Phone: [phone]   Fax:  [phone]  Speech Language Pathology Treatment  Patient Details  Name: Christina Evans MRN: [id number] Date of Birth: March 05, 1961 Referring Provider (SLP): Antony Contras, MD   Encounter Date: 06/11/2020   End of Session - 06/11/20 1002    Visit Number 13    Number of Visits 17    Date for SLP Re-Evaluation 06/19/20    SLP Start Time 0845    SLP Stop Time  0925    SLP Time Calculation (min) 40 min    Activity Tolerance Patient tolerated treatment well           Past Medical History:  Diagnosis Date  . Asthma   . CVA (cerebral vascular accident) ([facility])   . Diabetes mellitus without complication [facility])     Past Surgical History:  Procedure Laterality Date  . IR ANGIO INTRA EXTRACRAN SEL COM CAROTID INNOMINATE BILAT MOD SED  09/15/2019  . IR ANGIO VERTEBRAL SEL VERTEBRAL UNI L MOD SED  09/15/2019    There were no vitals filed for this visit.   Subjective Assessment - 06/11/20 0850    Subjective "I think yesterday it was raining."                 ADULT SLP TREATMENT - 06/11/20 0851      General Information   Behavior/Cognition Cooperative;Distractible;Requires cueing;Decreased sustained attention      Treatment Provided   Treatment provided Cognitive-Linquistic      Pain Assessment   Pain Assessment No/denies pain      Cognitive-Linquistic Treatment   Treatment focused on Cognition;Patient/family/caregiver education    Skilled Treatment Daughter ordered timer but it hasn't arrived yet. Daughter reporting frustration with pt "getting lost" during conversations. SLP reviewed scenarios with daughter and explained that majority of what appears to be happening is pt is not able to focus on the conversation (evidenced today by patient answering different questions than asked, looking out the window, asking questions  about unrelated topics). Provided handout with strategies to use with patient when this occurs. SLP attempted activity with patient to have her REPEAT a simple question when asked. Patient was unable to do this (answering the question, sometimes incorrectly, even when prompted directly to repeat the question, not answer). For recall, as pt literacy in [place] and Spanish limits her ability to use written text as a compensation, advised having pt take a photograph on her tablet or smartphone of events, family visits, shopping trips, therapy outings, etc to review when discussing her day with family. SLP educated daughter that with severity of attention deficit continued ST is not likely to have much benefit for patient and that she may benefit from MD increasing dose of attention medication and then determining if this may help patient make further gains in therapy; she has follow-up on Tuesday.       Assessment / Recommendations / Plan   Plan Continue with current plan of care   Pt has rehab follow-up on Tuesday; prob hold next session     Progression Toward Goals   Progression toward goals Not progressing toward goals (comment)   severity of attention deficit           SLP Education - 06/11/20 1002    Education Details MD may need to increase attention meds    Person(s) Educated Patient;Child(ren)    Methods Explanation    Comprehension Verbalized understanding  SLP Short Term Goals - 06/11/20 1005      SLP SHORT TERM GOAL #1   Title pt will demonstrate attention appropriate to benefit from continued speech therapy over 4 sessions    Baseline 04-29-20, 05-01-20    Period --   or 9 visits, for all STGs   Status Partially Met      SLP SHORT TERM GOAL #2   Title pt will demo selective attention to simple linguistic task for 90 seconds with occasionl min A x 3 sessions    Baseline 04/23/20, 04-29-20    Status Achieved            SLP Long Term Goals - 06/11/20 1005      SLP  LONG TERM GOAL #1   Title pt will demo sustained/selective attention for a simple 5 minute linguistic task with rare min A over 3 sessions    Time 1    Period Weeks   or 17 total sessions, for all LTGS   Status On-going      SLP LONG TERM GOAL #2   Title pt will demonstrate attention appropriate for continued ST over 8 sessions    Baseline 05-01-20, 05-06-20    Time 1    Period Weeks    Status On-going            Plan - 06/11/20 1002    Clinical Impression Statement Pt presents with significant cognitive linguistic deficits stemming primarily from attentional deficits - relatively unchanged over last 3 sessions. Continue ST for one additional session for caregiver education, with probable therapy hold as severity of pt's attention deficit is limiting her ability to participate in and progress with therapy. Patient has started attention med but thus far appears to have had minimal impact on pt's attention, functionally. Have advised daughter discuss with MD whether it may be appropriate to adjust pt's medication for attention. See pt note today for more details. Daughter was present for 4th session in a row today after SLP educated re: need for her attendance for any carryover at home. SLP will cont to monitor pt's appropriateness for ST, given her attention deficits. If pt cont to show decr'd attention necessary for efficacious ST she may be put on hold or d/c'd until appropriate.    Speech Therapy Frequency 2x / week    Duration --   8 weeks, or 17 total sessions   Treatment/Interventions Cognitive reorganization;Internal/external aids;Patient/family education;Compensatory strategies;SLP instruction and feedback;Cueing hierarchy;Functional tasks;Environmental controls;Multimodal communcation approach    Potential to Achieve Goals Fair    Potential Considerations Severity of impairments;Cooperation/participation level;Ability to learn/carryover information           Patient will benefit  from skilled therapeutic intervention in order to improve the following deficits and impairments:   Cognitive communication deficit    Problem List Patient Active Problem List   Diagnosis Date Noted  . Cerebrovascular accident (CVA) due to thrombosis of precerebral artery ([place]) 11/30/2019  . 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 Spastic hemiparesis affecting nondominant side ([place]) 11/30/2019  . Chronic bilateral low back pain with sciatica 11/30/2019  . Metatarsal fracture 09/22/2019  . Depression 09/22/2019  . Asthma 09/22/2019  . Palliative care by specialist   . Goals of care, counseling/discussion   . Encephalopathy   . Acute CVA (cerebrovascular accident) ([place]) 09/14/2019  . Acute arterial ischemic stroke, multifocal, anterior circulation, right ([place]) 09/14/2019  . Severe comorbid illness   . AMS (altered  mental status) 09/13/2019  . Controlled diabetes mellitus type 2 with complications ([place]) 80/99/8338  . Hyperlipidemia 09/02/2017  . Hypertension 09/02/2017  . Chest pain 09/02/2017   Deneise Lever, [place], [place] 06/11/2020, 10:06 AM  [facility] [address] [place], [place], [address] Phone: [phone]   Fax:  [phone]   Name: Maloree Uplinger MRN: [id number] Date of Birth: [birth year]

## 2020-06-11 NOTE — Therapy (Signed)
[facility] [address] [place], [place], [address] Phone: [phone]   Fax:  [phone]  Occupational Therapy Treatment  Patient Details  Name: Christina Evans MRN: [id number] Date of Birth: 1961-06-08 Referring Provider (OT): Dr. Pearlean Brownie   Encounter Date: 06/11/2020   OT End of Session - 06/11/20 0826    Visit Number 24    Number of Visits 31    Date for OT Re-Evaluation 06/24/20    Authorization Type UHC medicare and medicaid as secondary. Pt will need PN every 10th visit    Authorization Time Period 90 days - 07/22/2020 (renwewed on 04/29/2020)    Authorization - Visit Number 24    Authorization - Number of Visits 30    OT Start Time 0803    OT Stop Time 0841    OT Time Calculation (min) 38 min    Activity Tolerance Patient tolerated treatment well           Past Medical History:  Diagnosis Date  . Asthma   . CVA (cerebral vascular accident) (HCC)   . Diabetes mellitus without complication [facility])     Past Surgical History:  Procedure Laterality Date  . IR ANGIO INTRA EXTRACRAN SEL COM CAROTID INNOMINATE BILAT MOD SED  09/15/2019  . IR ANGIO VERTEBRAL SEL VERTEBRAL UNI L MOD SED  09/15/2019    There were no vitals filed for this visit.   Subjective Assessment - 06/11/20 0825    Subjective  Pt reports hse has not been stretching consistently    Currently in Pain? No/denies                Treatment:supine passive stretch to LUE followed by pt performance of self ROM shoulder flexion, diagonals, mod facilitation/ v.c Sidelying on left side to perform AA/ROM elbow flexion/ extension, and supination/ pronation, min-mod facilitation/ v.c NME to wrist and finger extensors, 50 pps, 250 pw, 10 secs cycle intensity 17 x 9 mins for spasticity management. Functional grasp / release of wooden dowel pegs, mod facilitation.                OT Short Term Goals - 05/22/20 1218      OT SHORT  TERM GOAL #1   Title Pt and dtr will be mod  I with HEP to address ROM for RUE - 03/20/2020    Status Achieved      OT SHORT TERM GOAL #2   Title Pt and dtr will verbalize understanding for AE for cutting food on plate    Status Achieved      OT SHORT TERM GOAL #3   Title Pt will be mod I with washing face and set up for bushing teeth.    Status Achieved      OT SHORT TERM GOAL #4   Title Pt will be mod a for UB dressing    Status Achieved      OT SHORT TERM GOAL #5   Title Pt will be mod a for LB dressing    Status Achieved      OT SHORT TERM GOAL #6   Title Pt will require mod a to hike pants    Status Achieved      OT SHORT TERM GOAL #7   Title Pt and dtr will verbalize understanding of toileting schedule to address incontinence    Status Achieved      OT SHORT TERM GOAL #8   Title Pt and dtr will be mod with  splint wear and care (resting hand splint at night)    Status Achieved      OT SHORT TERM GOAL  #9   TITLE Pt will demonstrate ability to set table and wipe table (with dishes/silverware set on table) with supervision - 05/27/2020    Status On-going             OT Long Term Goals - 05/22/20 1219      OT LONG TERM GOAL #1   Title Pt and dtr will be mod I with upgraded home activities program to include focus ADL's, transitional movements and balance. - 06/24/2020    Status On-going      OT LONG TERM GOAL #2   Title Pt will be supervision for toilet transfers    Status Achieved      OT LONG TERM GOAL #3   Title Pt will be no more than min a for hiking pants and toilet hygiene    Status Achieved      OT LONG TERM GOAL #4   Title Pt will tolerate 110* of L shoulder flexion and 90* abduction during HEP for ease with ADL's with more than " a little " pain based on faces.    Status Achieved      OT LONG TERM GOAL #5   Title Pt will be supervision for simple snack prep    Status On-going      OT LONG TERM GOAL #6   Title Pt and dtr will complete FOTO outcome  scale    Status On-going                 Plan - 06/11/20 0836    Clinical Impression Statement Pt is progressing slowly. Due to cognitve deficits she forgets to exercise at home and has not been wearing her splint consistently.    OT Occupational Profile and History Detailed Assessment- Review of Records and additional review of physical, cognitive, psychosocial history related to current functional performance    Occupational performance deficits (Please refer to evaluation for details): ADL's;IADL's;Work;Leisure;Social Participation    Body Structure / Function / Physical Skills ADL;Balance;Continence;Endurance;GMC;IADL;Mobility;Sensation;ROM;Pain;Strength;Tone;UE functional use    Rehab Potential Good    Clinical Decision Making Multiple treatment options, significant modification of task necessary    Comorbidities Affecting Occupational Performance: Presence of comorbidities impacting occupational performance    Comorbidities impacting occupational performance description: see above    Modification or Assistance to Complete Evaluation  Max significant modification of tasks or assist is necessary to complete    OT Frequency 2x / week    OT Duration 8 weeks    OT Treatment/Interventions Self-care/ADL training;Aquatic Therapy;Electrical Stimulation;Ultrasound;Moist Heat;Therapeutic exercise;Neuromuscular education;Manual Therapy;Functional Mobility Training;DME and/or AE instruction;Passive range of motion;Patient/family education;Cognitive remediation/compensation;Therapeutic activities;Splinting;Balance training    Plan NMR for LUE/trunk, postural alignment and control, balance and functional mobility,    Consulted and Agree with Plan of Care Patient;Family member/caregiver    Family Member Consulted dtr           Patient will benefit from skilled therapeutic intervention in order to improve the following deficits and impairments:   Body Structure / Function / Physical Skills:  ADL, Balance, Continence, Endurance, GMC, IADL, Mobility, Sensation, ROM, Pain, Strength, Tone, UE functional use       Visit Diagnosis: Muscle weakness (generalized)  Other symptoms and signs involving the nervous system  Abnormal posture  Spastic hemiplegia of left nondominant side as late effect of cerebral infarction (HCC)  Stiffness of left upper arm  joint    Problem List Patient Active Problem List   Diagnosis Date Noted  . Cerebrovascular accident (CVA) due to thrombosis of precerebral artery (HCC) 11/30/2019  . 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 Spastic hemiparesis affecting nondominant side (HCC) 11/30/2019  . Chronic bilateral low back pain with sciatica 11/30/2019  . Metatarsal fracture 09/22/2019  . Depression 09/22/2019  . Asthma 09/22/2019  . Palliative care by specialist   . Goals of care, counseling/discussion   . Encephalopathy   . Acute CVA (cerebrovascular accident) (HCC) 09/14/2019  . Acute arterial ischemic stroke, multifocal, anterior circulation, right (HCC) 09/14/2019  . Severe comorbid illness   . AMS (altered mental status) 09/13/2019  . 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  . Hyperlipidemia 09/02/2017  . Hypertension 09/02/2017  . Chest pain 09/02/2017    Josalynn Johndrow 06/11/2020, 8:37 AM  [facility] [address] [place], [place], [address] Phone: [phone]   Fax:  [phone]  Name: Kyesha Balla MRN: [id number] Date of Birth: [birth year]

## 2020-06-13 ENCOUNTER — Ambulatory Visit: Payer: Medicare Other | Admitting: Occupational Therapy

## 2020-06-13 ENCOUNTER — Ambulatory Visit: Payer: Medicare Other | Admitting: Speech Pathology

## 2020-06-13 ENCOUNTER — Other Ambulatory Visit: Payer: Self-pay

## 2020-06-13 ENCOUNTER — Other Ambulatory Visit: Payer: Self-pay | Admitting: Physical Medicine & Rehabilitation

## 2020-06-13 ENCOUNTER — Ambulatory Visit: Payer: Medicare Other

## 2020-06-13 DIAGNOSIS — I69354 Hemiplegia and hemiparesis following cerebral infarction affecting left non-dominant side: Secondary | ICD-10-CM

## 2020-06-13 DIAGNOSIS — R293 Abnormal posture: Secondary | ICD-10-CM

## 2020-06-13 DIAGNOSIS — R2689 Other abnormalities of gait and mobility: Secondary | ICD-10-CM

## 2020-06-13 DIAGNOSIS — R29818 Other symptoms and signs involving the nervous system: Secondary | ICD-10-CM

## 2020-06-13 DIAGNOSIS — M6281 Muscle weakness (generalized): Secondary | ICD-10-CM

## 2020-06-13 MED FILL — tiZANidine HCL 4 MG TABS: 4 | 30 days supply | Qty: 30 | Fill #0

## 2020-06-13 NOTE — Therapy (Signed)
[place] [address] [place], [place], [address] Phone: [phone]   Fax:  [phone]  Physical Therapy Treatment  Patient Details  Name: Christina Evans MRN: [id number] Date of Birth: [birth year] Referring Provider (PT): Garvin Fila, MD   Encounter Date: 06/13/2020   PT End of Session - 06/13/20 0848    Visit Number 28    Number of Visits 35    Date for PT Re-Evaluation 08/05/20   written for 60 day POC   Authorization Type UHC Medicare    Progress Note Due on Visit 29   did progress note on visit 75   PT Start Time 0847    PT Stop Time 0929    PT Time Calculation (min) 42 min    Equipment Utilized During Treatment Gait belt;Other (comment)   shoe build up on left side   Activity Tolerance Patient tolerated treatment well    Behavior During Therapy WFL for tasks assessed/performed           Past Medical History:  Diagnosis Date  . Asthma   . CVA (cerebral vascular accident) ([place])   . Diabetes mellitus without complication [facility])     Past Surgical History:  Procedure Laterality Date  . IR ANGIO INTRA EXTRACRAN SEL COM CAROTID INNOMINATE BILAT MOD SED  09/15/2019  . IR ANGIO VERTEBRAL SEL VERTEBRAL UNI L MOD SED  09/15/2019    There were no vitals filed for this visit.   Subjective Assessment - 06/13/20 0848    Subjective Pt's daughter did hear back from therapist at [facility]. They could get them in tomorrow but daughter not available. She asked if they could do Monday and waiting to hear back. Pt still walking minimally around home as does not want to even when daughter tries to encourage. Walks a little when goes out with her daughter to other daughter's house to get in.    Patient is accompained by: Family member    Pertinent History PMH significant for large right MCA CVA, HTN, T2DM, HLD, hx of Left foot fifth metatarsal fracture with osteopenia, asthma.    Limitations Standing;Walking     How long can you stand comfortably? 1 minute (when daughter is changing her when she is wearing diapers).    How long can you walk comfortably? a few feet.    Diagnostic tests MRI brain with moderate sized acute to early subacute left ACA territory infarct andlarge chronic right MCA territory infarct.    Patient Stated Goals wants to get better, wants to be independent.    Currently in Pain? No/denies                             [facility] Adult PT Treatment/Exercise - 06/13/20 0853      Transfers   Transfers Sit to Stand;Stand to Sit;Stand Pivot Transfers    Sit to Stand 5: Supervision    Stand to Sit 5: Supervision    Stand Pivot Transfers 5: Supervision      Ambulation/Gait   Ambulation/Gait Yes    Ambulation/Gait Assistance 4: Min guard    Ambulation/Gait Assistance Details 34m shoe lift under left foot held on with coban and shoe cover to allow to slide easier. PT helping to faciliate left weight shift and anterior pelvic rotation on left during gait. Verbal cues to increase right step length to help increase left stance time    Ambulation Distance (Feet) 230  Feet    Assistive device Straight cane   with quad tip   Gait Pattern Step-through pattern;Decreased step length - left;Decreased hip/knee flexion - left;Decreased stance time - left;Decreased weight shift to left;Trunk rotated posteriorly on left    Ambulation Surface Level;Indoor      Neuro Re-ed    Neuro Re-ed Details  In // bars: step-to 4" step with RLE x 10 with visual cues from mirror and tactile cues from therapist to stand tall and shift weight over LLE. Pt was able to correct with cuing with less kicking hip out. Standing with RLE on 4" step trying to get patient to stand tall and then let up on RUE support. Pt too scared to completely let go. Standing without UE support working on upright posture then weight shifting without UE support x 10. Step-ups on 4" step with LLE x 10 with tactile cues at knee to  tighten with RUE support. Reciprocal steps over 2 black foam beams x 6. Pt with difficulty with reciprocal pattern and needed max cuing. Alternating toe taps on black foam x 6.                  PT Education - 06/13/20 1307    Education Details PT discussed again with patient about importance of walking more at home with daughter. Advised daughter to walk in with her for next therapy session.    Person(s) Educated Patient;Child(ren)    Methods Explanation    Comprehension Verbalized understanding            PT Short Term Goals - 06/04/20 0942      PT SHORT TERM GOAL #1   Title Pt will receive L AFO for functional gait, mobility, and transfers. ALL STGS DUE 06/04/20    Baseline Pt has received left AFO    Time 4    Period Weeks    Status Achieved    Target Date 06/04/20      PT SHORT TERM GOAL #2   Title Pt will perform stand step transfers with min guard consistently in order to decr caregiver burder.    Baseline stand/pivot w/c to mat supervision. Daughter reports she is only standing by for safety at home with transfers.    Time 4    Period Weeks    Status Achieved      PT SHORT TERM GOAL #3   Title Pt will ambulate with L AFO and LRAD with supervision for 115' in order to improve household mobility.    Baseline CGA/min assist with gait with RW with left hand grip attachment and left AFO    Time 4    Period Weeks    Status Partially Met      PT SHORT TERM GOAL #4   Title Pt will perform all bed mobility with supervision in order to decr caregiver burden.    Baseline needs min A for rolling to L and pushing up to sit, 06/04/20 rolling and sit to/from supine supervision    Time 4    Period Weeks    Status Achieved      PT SHORT TERM GOAL #5   Title Pt will undergo assessment of gait speed when appropriate - with LTG to be written    Baseline assessed on 06/04/20 at was 0.85ms    Time 4    Period Weeks    Status Achieved             PT Long Term Goals -  06/04/20 1134      PT LONG TERM GOAL #1   Title Pt will perform stand step transfers with no AD with mod I in order to decr caregiver burden. ALL LTGS DUE 07/02/20    Time 8    Period Weeks    Status New      PT LONG TERM GOAL #2   Title Pt will tolerate static standing for at least 5 minutes with no UE support with supervision in order to improve tolerance for ADLs.    Baseline able to tolerate dynamic standing activities for 5 minutes for stepping with RLE with mod A from therapist    Time 8    Period Weeks    Status New      PT LONG TERM GOAL #3   Title Pt will ambulate at least 230' over level indoor/unlevel outdoor surfaces with LRAD with L AFO with min guard in order to improve functional mobility.    Time 8    Period Weeks    Status New      PT LONG TERM GOAL #4   Title Pt will perform at least 5 sit <> stand transfers from mat table and standard height chair with mod I with no UE support in order to demo improved functional transfers    Time 8    Period Weeks    Status On-going      PT LONG TERM GOAL #5   Title Pt and pt's daughter will be independent with final HEP for weight bearing/lower extremity stretching/strengthening.    Time 8    Period Weeks    Status On-going      Additional Long Term Goals   Additional Long Term Goals Yes      PT LONG TERM GOAL #6   Title Pt will increase gait speed from 0.70ms to >0.331m for improved gait safety and mobility.    Baseline 0.2271mon 06/04/20    Time 8    Period Weeks    Status New    Target Date 07/02/20                 Plan - 06/13/20 1309    Clinical Impression Statement Pt continued to focus on improving left weight shift with gait and NMR today. Pt does have some fear as does not trust left leg but did well with weight shift in // bars and responded good to visual feedback.    Personal Factors and Comorbidities Comorbidity 3+;Past/Current Experience;Time since onset of injury/illness/exacerbation;Behavior  Pattern    Comorbidities PMH significant for large right MCA CVA, HTN, T2DM, HLD, hx of Left foot fifth metatarsal fracture with osteopenia, asthma.    Examination-Activity Limitations Stand;Locomotion Level;Transfers;Toileting;Continence;Dressing    Examination-Participation Restrictions Community Activity    Stability/Clinical Decision Making Evolving/Moderate complexity    Rehab Potential Good    PT Frequency 2x / week    PT Duration 8 weeks    PT Treatment/Interventions ADLs/Self Care Home Management;Aquatic Therapy;Electrical Stimulation;DME Instruction;Gait training;Functional mobility training;Neuromuscular re-education;Balance training;Therapeutic exercise;Therapeutic activities;Patient/family education;Orthotic Fit/Training;Passive range of motion;Energy conservation    PT Next Visit Plan [facility] to [facility]. gait training with daughter with AFO for home. continue with stepping activities with RLE(with weight shifting towards LLE), gait training with no AD and with SPC with quad tip - with focus on facilitation for L weight shift and hip extension during terminal stance.    Consulted and Agree with  Plan of Care Patient;Family member/caregiver           Patient will benefit from skilled therapeutic intervention in order to improve the following deficits and impairments:  Abnormal gait, Decreased activity tolerance, Decreased coordination, Decreased balance, Decreased cognition, Decreased endurance, Decreased knowledge of use of DME, Decreased range of motion, Decreased strength, Difficulty walking, Impaired tone, Impaired UE functional use, Postural dysfunction, Impaired sensation  Visit Diagnosis: Other abnormalities of gait and mobility  Muscle weakness (generalized)     Problem List Patient Active Problem List   Diagnosis Date Noted  . Cerebrovascular accident (CVA) due to thrombosis of precerebral artery  ([place]) 11/30/2019  . 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 Spastic hemiparesis affecting nondominant side ([place]) 11/30/2019  . Chronic bilateral low back pain with sciatica 11/30/2019  . Metatarsal fracture 09/22/2019  . Depression 09/22/2019  . Asthma 09/22/2019  . Palliative care by specialist   . Goals of care, counseling/discussion   . Encephalopathy   . Acute CVA (cerebrovascular accident) ([place]) 09/14/2019  . Acute arterial ischemic stroke, multifocal, anterior circulation, right ([place]) 09/14/2019  . Severe comorbid illness   . AMS (altered mental status) 09/13/2019  . Controlled diabetes mellitus type 2 with complications ([place]) 91/50/5697  . Hyperlipidemia 09/02/2017  . Hypertension 09/02/2017  . Chest pain 09/02/2017    Electa Sniff, PT, DPT, NCS 06/13/2020, 1:12 PM  [place] [address] [place], [place], [address] Phone: [phone]   Fax:  [phone]  Name: Christina Evans MRN: [id number] Date of Birth: [birth year]

## 2020-06-13 NOTE — Therapy (Signed)
[facility] [address] [place], [place], [address] Phone: [phone]   Fax:  [phone]  Occupational Therapy Treatment  Patient Details  Name: Christina Evans MRN: [id number] Date of Birth: 1961/11/13 Referring Provider (OT): Dr. Pearlean Brownie   Encounter Date: 06/13/2020   OT End of Session - 06/13/20 0808    Visit Number 25    Number of Visits 31    Date for OT Re-Evaluation 06/24/20    Authorization Type UHC medicare and medicaid as secondary. Pt will need PN every 10th visit    Authorization Time Period 90 days - 07/22/2020 (renwewed on 04/29/2020)    Authorization - Visit Number 25    Authorization - Number of Visits 30    OT Start Time 0805    OT Stop Time 0845    OT Time Calculation (min) 40 min    Activity Tolerance Patient tolerated treatment well           Past Medical History:  Diagnosis Date   Asthma    CVA (cerebral vascular accident) (HCC)    Diabetes mellitus without complication (HCC)     Past Surgical History:  Procedure Laterality Date   IR ANGIO INTRA EXTRACRAN SEL COM CAROTID INNOMINATE BILAT MOD SED  09/15/2019   IR ANGIO VERTEBRAL SEL VERTEBRAL UNI L MOD SED  09/15/2019    There were no vitals filed for this visit.   Subjective Assessment - 06/13/20 0810    Subjective  Pt reports she did stretch but did not wear splint last night    Currently in Pain? No/denies            Treatmen:t Seated at table, self ROM tableslides for shoulder flexion and abduction, then passive stretch in supination pronation, mod v.c due to decreased attention. Standing at table to weightbear through bilateral UE's, and weightshift to left side mod A/ facilitation for LUE, pt demo decreased ability to sustain attention. NMEs to wrist and finger extensors, 50 pps, 250 pw, 10 secs cycle intensity 13 x 10 mins for spasticity management.  Self stretch shoulder flexion reach for the floor in seated 10  reps, min v.c   Functional grasp release of 1 inch blocks, mod faciiltation.                    OT Short Term Goals - 05/22/20 1218      OT SHORT TERM GOAL #1   Title Pt and dtr will be mod  I with HEP to address ROM for RUE - 03/20/2020    Status Achieved      OT SHORT TERM GOAL #2   Title Pt and dtr will verbalize understanding for AE for cutting food on plate    Status Achieved      OT SHORT TERM GOAL #3   Title Pt will be mod I with washing face and set up for bushing teeth.    Status Achieved      OT SHORT TERM GOAL #4   Title Pt will be mod a for UB dressing    Status Achieved      OT SHORT TERM GOAL #5   Title Pt will be mod a for LB dressing    Status Achieved      OT SHORT TERM GOAL #6   Title Pt will require mod a to hike pants    Status Achieved      OT SHORT TERM GOAL #7   Title Pt and  dtr will verbalize understanding of toileting schedule to address incontinence    Status Achieved      OT SHORT TERM GOAL #8   Title Pt and dtr will be mod with splint wear and care (resting hand splint at night)    Status Achieved      OT SHORT TERM GOAL  #9   TITLE Pt will demonstrate ability to set table and wipe table (with dishes/silverware set on table) with supervision - 05/27/2020    Status On-going             OT Long Term Goals - 05/22/20 1219      OT LONG TERM GOAL #1   Title Pt and dtr will be mod I with upgraded home activities program to include focus ADL's, transitional movements and balance. - 06/24/2020    Status On-going      OT LONG TERM GOAL #2   Title Pt will be supervision for toilet transfers    Status Achieved      OT LONG TERM GOAL #3   Title Pt will be no more than min a for hiking pants and toilet hygiene    Status Achieved      OT LONG TERM GOAL #4   Title Pt will tolerate 110* of L shoulder flexion and 90* abduction during HEP for ease with ADL's with more than " a little " pain based on faces.    Status Achieved       OT LONG TERM GOAL #5   Title Pt will be supervision for simple snack prep    Status On-going      OT LONG TERM GOAL #6   Title Pt and dtr will complete FOTO outcome scale    Status On-going                  Patient will benefit from skilled therapeutic intervention in order to improve the following deficits and impairments:           Visit Diagnosis: Muscle weakness (generalized)  Other symptoms and signs involving the nervous system  Abnormal posture  Other abnormalities of gait and mobility  Spastic hemiplegia of left nondominant side as late effect of cerebral infarction 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 11/30/2019   Spastic hemiparesis affecting nondominant side (HCC) 11/30/2019   Chronic bilateral low back pain with sciatica 11/30/2019   Metatarsal fracture 09/22/2019   Depression 09/22/2019   Asthma 09/22/2019   Palliative care by specialist    Goals of care, counseling/discussion    Encephalopathy    Acute CVA (cerebrovascular accident) (HCC) 09/14/2019   Acute arterial ischemic stroke, multifocal, anterior circulation, right (HCC) 09/14/2019   Severe comorbid illness    AMS (altered mental status) 09/13/2019   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   Hyperlipidemia 09/02/2017   Hypertension 09/02/2017   Chest pain 09/02/2017    Christina Evans 06/13/2020, 8:30 AM  [facility] [address] [place], [place], [address] Phone: [phone]   Fax:  [phone]  Name: Christina Evans MRN: [id number] Date of Birth: October 06, 1961

## 2020-06-18 ENCOUNTER — Encounter: Payer: Medicare Other | Attending: Physical Medicine & Rehabilitation | Admitting: Physical Medicine & Rehabilitation

## 2020-06-18 ENCOUNTER — Other Ambulatory Visit: Payer: Self-pay

## 2020-06-18 ENCOUNTER — Encounter: Payer: Self-pay | Admitting: Physical Medicine & Rehabilitation

## 2020-06-18 VITALS — BP 115/73 | HR 105 | Temp 98.9°F | Ht 61.0 in | Wt 164.0 lb

## 2020-06-18 DIAGNOSIS — Z5181 Encounter for therapeutic drug level monitoring: Secondary | ICD-10-CM | POA: Diagnosis present

## 2020-06-18 DIAGNOSIS — I69319 Unspecified symptoms and signs involving cognitive functions following cerebral infarction: Secondary | ICD-10-CM

## 2020-06-18 DIAGNOSIS — G8110 Spastic hemiplegia affecting unspecified side: Secondary | ICD-10-CM

## 2020-06-18 DIAGNOSIS — Z79899 Other long term (current) drug therapy: Secondary | ICD-10-CM | POA: Insufficient documentation

## 2020-06-18 MED ORDER — METHYLPHENIDATE HCL 10 MG PO TABS: 10.0000 mg | ORAL_TABLET | Freq: Every morning | ORAL | 0 refills | Status: DC

## 2020-06-18 MED FILL — METHYLPHENIDATE 10 MG TAB: 10 | 30 days supply | Qty: 30 | Fill #0

## 2020-06-18 MED FILL — ACCU-CHEK GUIDE STRP: 30 days supply | Qty: 100 | Fill #7

## 2020-06-18 NOTE — Progress Notes (Signed)
Subjective:    Patient ID: Christina Evans, female    DOB: 1961/10/21, 59 y.o.   MRN: [id number]  59 year old female who had a right MCA distribution infarct in 2008 while living in [place].  She underwent rehabilitation.  She has subsequently moved to the [place] area.  She was hospitalized in October 2020 for mental status  changes but no new stroke was identified.  She underwent neurologic evaluation first by Dr. Pearlean Brownie and then was referred to Dr. Terrace Arabia 11/30/2019 for botulinum toxin injections. The patient had MRI of the brain on 10/20/2019 which I reviewed demonstrating: Large chronic right MCA infarct.  Patient and daughter indicate that the arm and the leg are giving the patient problems. Was hospitalized for L ACA infarct in Oct 2020 which later evaluations questioned whether this was an acute finding.  L ACA infarct would not explain increased weakness LUE or LLE Last botulinum toxin injection, Xeomin, 01/04/2020 Left pectoralis major 100 units Left platysmas dorsi 100 units Left brachialis 100 units Left pronator teres 25 units Left flexor carpi ulnaris 25 units Left flexor digitorum profundus 25 units Left flexor digitorum superficialis 25 units  HPI  Pt underwent Dyport injection 6 weeks ago, no post procedure complications.  Left Trunk Pec 200 Upper ext Biceps 200 FCR 200 FDS 200 FDP 200 Lower ext PT 300 FDL 200  Pain Inventory Average Pain 10 Pain Right Now 10 My pain is intermittent and sharp  In the last 24 hours, has pain interfered with the following? General activity 5 Relation with others 0 Enjoyment of life 0 What TIME of day is your pain at its worst? morning Sleep (in general) Poor  Pain is worse with: walking Pain improves with: heat/ice and medication Relief from Meds: Tylenol takes the pain away.  Mobility use a cane use a walker ability to climb steps?  no do you drive?  no use a wheelchair Do you have any goals in this area?   yes  Function disabled: date disabled 2008 I need assistance with the following:  meal prep, household duties and shopping Do you have any goals in this area?  yes  Neuro/Psych bowel control problems weakness trouble walking spasms dizziness  Prior Studies Any changes since last visit?  no  Physicians involved in your care Any changes since last visit?  no   Family History  Problem Relation Age of Onset  . Hypertension Mother   . Hypertension Father    Social History   Socioeconomic History  . Marital status: Single    Spouse name: Not on file  . Number of children: Not on file  . Years of education: Not on file  . Highest education level: Not on file  Occupational History  . Not on file  Tobacco Use  . Smoking status: Former Games developer  . Smokeless tobacco: Never Used  . Tobacco comment: QUIT IN 2008  Vaping Use  . Vaping Use: Never used  Substance and Sexual Activity  . Alcohol use: Yes    Comment: RARE  . Drug use: No  . Sexual activity: Not Currently  Other Topics Concern  . Not on file  Social History Narrative   Lives at home with significant other (not married).     Stays in close communication with 2 of her children who live in [place].     She has multiple other children who currently live in [place].     Former smoker, quit in 2008.    Right  handed   No caffeine   Social Determinants of Health   Financial Resource Strain:   . Difficulty of Paying Living Expenses:   Food Insecurity:   . Worried About Programme researcher, broadcasting/film/video in the Last Year:   . Barista in the Last Year:   Transportation Needs:   . Freight forwarder (Medical):   Marland Kitchen Lack of Transportation (Non-Medical):   Physical Activity:   . Days of Exercise per Week:   . Minutes of Exercise per Session:   Stress:   . Feeling of Stress :   Social Connections:   . Frequency of Communication with Friends and Family:   . Frequency of Social Gatherings with Friends and Family:    . Attends Religious Services:   . Active Member of Clubs or Organizations:   . Attends Banker Meetings:   Marland Kitchen Marital Status:    Past Surgical History:  Procedure Laterality Date  . IR ANGIO INTRA EXTRACRAN SEL COM CAROTID INNOMINATE BILAT MOD SED  09/15/2019  . IR ANGIO VERTEBRAL SEL VERTEBRAL UNI L MOD SED  09/15/2019   Past Medical History:  Diagnosis Date  . Asthma   . CVA (cerebral vascular accident) (HCC)   . Diabetes mellitus without complication (HCC)    BP 115/73   Pulse 105   Temp 98.9 F (37.2 C)   Ht 5\' 1"  (1.549 m)   Wt 164 lb (74.4 kg)   SpO2 95%   BMI 30.99 kg/m   Opioid Risk Score:   Fall Risk Score:  `1  Depression screen PHQ 2/9  Depression screen PHQ 2/9 03/29/2020  Decreased Interest 2  Down, Depressed, Hopeless 2  PHQ - 2 Score 4  Altered sleeping 3  Tired, decreased energy 3  Change in appetite 2  Feeling bad or failure about yourself  0  Trouble concentrating 3  Moving slowly or fidgety/restless 2  Suicidal thoughts 1  PHQ-9 Score 18   Review of Systems  Constitutional: Negative.   HENT: Negative.   Eyes: Negative.   Respiratory: Negative.   Cardiovascular: Negative.        Left foot swelling  Gastrointestinal: Positive for diarrhea.  Endocrine: Negative.   Genitourinary: Negative.   Musculoskeletal: Positive for gait problem.  Skin: Negative.   Neurological: Positive for dizziness and weakness.  Hematological: Negative.   Psychiatric/Behavioral: Negative.        Objective:   Physical Exam Vitals and nursing note reviewed.  Constitutional:      Appearance: Normal appearance.  HENT:     Head: Normocephalic and atraumatic.  Eyes:     Extraocular Movements: Extraocular movements intact.     Conjunctiva/sclera: Conjunctivae normal.     Pupils: Pupils are equal, round, and reactive to light.  Neurological:     Mental Status: She is alert.     Comments: Motor strength is 5/5 in the right deltoid, bicep, tricep,  grip, hip flexor, knee extensor ankle dorsiflexor Motor strength is 3 - at the left deltoid 3 months of bicep and tricep to minus at finger flexors and extensors 3 - at the left hip flexor 4 - at the knee extensor and to minus at the ankle dorsiflexion plantar flexor Tone Left upper extremity MAS 3 at the pectoralis MAS 2 at the elbow flexors MAS 2 at the finger and wrist flexors MAS 1 at the foot inverters MAS 2 at the toe flexors  Psychiatric:        Mood and  Affect: Mood normal.        Behavior: Behavior normal.    Patient is able to sit stand with standby assistance Patient with reduced attention concentration      Assessment & Plan:  #1.  Left spastic hemiplegia secondary right CVA  Did better with the max dose of Dysport than with the max dose of Botox.  In particular feels like her left lower extremity spasticity has improved to the point where she is walking better. We'll continue with current dosing and repeat in 6 weeks discussed with patient and her daughter.  2.  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

## 2020-06-18 NOTE — Patient Instructions (Signed)
Will be on higher dose of methylphenidate to help with attention and concentration

## 2020-06-19 ENCOUNTER — Ambulatory Visit: Payer: Medicare Other | Admitting: Occupational Therapy

## 2020-06-19 ENCOUNTER — Ambulatory Visit: Payer: Medicare Other | Admitting: Physical Therapy

## 2020-06-19 ENCOUNTER — Ambulatory Visit: Payer: Medicare Other

## 2020-06-21 ENCOUNTER — Ambulatory Visit: Payer: Medicare Other | Admitting: Physical Therapy

## 2020-06-21 ENCOUNTER — Ambulatory Visit: Payer: Medicare Other

## 2020-06-21 ENCOUNTER — Ambulatory Visit: Payer: Medicare Other | Admitting: Occupational Therapy

## 2020-06-21 ENCOUNTER — Other Ambulatory Visit: Payer: Self-pay

## 2020-06-21 DIAGNOSIS — R29818 Other symptoms and signs involving the nervous system: Secondary | ICD-10-CM

## 2020-06-21 DIAGNOSIS — M6281 Muscle weakness (generalized): Secondary | ICD-10-CM | POA: Diagnosis not present

## 2020-06-21 DIAGNOSIS — R293 Abnormal posture: Secondary | ICD-10-CM

## 2020-06-21 DIAGNOSIS — R2689 Other abnormalities of gait and mobility: Secondary | ICD-10-CM

## 2020-06-21 DIAGNOSIS — I69354 Hemiplegia and hemiparesis following cerebral infarction affecting left non-dominant side: Secondary | ICD-10-CM

## 2020-06-21 NOTE — Therapy (Signed)
[facility] [address] [place], [place], [address] Phone: [phone]   Fax:  [phone]  Physical Therapy Treatment/10th Visit Progress Note  Patient Details  Name: Christina Evans MRN: [id number] Date of Birth: 1961-02-22 Referring Provider (PT): Garvin Fila, MD  10th Visit Physical Therapy Progress Note  Dates of Reporting Period: 05/08/20 to 06/21/20    Encounter Date: 06/21/2020   PT End of Session - 06/21/20 1334    Visit Number 29    Number of Visits 35    Date for PT Re-Evaluation 08/05/20   written for 60 day POC   Authorization Type [employer] Medicare    Progress Note Due on Visit 29   did progress note on visit 27   PT Start Time 0846    PT Stop Time 0928    PT Time Calculation (min) 42 min    Equipment Utilized During Treatment Gait belt;Other (comment)   shoe build up on left side   Activity Tolerance Patient tolerated treatment well    Behavior During Therapy WFL for tasks assessed/performed           Past Medical History:  Diagnosis Date  . Asthma   . CVA (cerebral vascular accident) ([place])   . Diabetes mellitus without complication [facility])     Past Surgical History:  Procedure Laterality Date  . IR ANGIO INTRA EXTRACRAN SEL COM CAROTID INNOMINATE BILAT MOD SED  09/15/2019  . IR ANGIO VERTEBRAL SEL VERTEBRAL UNI L MOD SED  09/15/2019    There were no vitals filed for this visit.   Subjective Assessment - 06/21/20 0855    Subjective Has not been walking too much at home - only really to go to the kitchen to get a snack. Has been walking with the RW when she does walk.    Pertinent History PMH significant for large right MCA CVA, HTN, T2DM, HLD, hx of Left foot fifth metatarsal fracture with osteopenia, asthma.    Limitations Standing;Walking    How long can you stand comfortably? 1 minute (when daughter is changing her when she is wearing diapers).    How long can you walk comfortably? a  few feet.    Diagnostic tests MRI brain with moderate sized acute to early subacute left ACA territory infarct andlarge chronic right MCA territory infarct.    Patient Stated Goals wants to get better, wants to be independent.    Currently in Pain? No/denies                             [facility] Adult PT Treatment/Exercise - 06/21/20 0905      Transfers   Transfers Sit to Stand;Stand to Sit;Stand Pivot Transfers (P)     Sit to Stand 5: Supervision (P)     Sit to Stand Details (indicate cue type and reason) cues to stand with a wider BOS prior to putting L hand in L hand orthosis for incr weight shift towards LLE (P)     Stand to Sit 5: Supervision (P)     Stand to Sit Details when ambulating with RW cues to remove L hand from orthosis prior to sitting  (P)     Stand Pivot Transfers 4: Min assist (P)     Stand Pivot Transfer Details (indicate cue type and reason) for weight shifting to LLE  (P)       Ambulation/Gait   Ambulation/Gait Yes  Ambulation/Gait Assistance 4: Min guard;4: Min assist    Ambulation/Gait Assistance Details 80m shoe lift under left foot held on by coban and shoe cover on for improved foot clearance. needing assisting for weight shifting towards LLE and for anterior pelvic rotation during gait. needing initial verbal cues for larger step lengths with RLE, with pt demonstrating good carryover with step length. when ambulating with RW needing intermittent assist for proper LUE placement in hand orthosis (P)     Ambulation Distance (Feet) 230 Feet   x1, 115' x 1 w/ SPC with quad tip, 115' with RW    Assistive device Straight cane;Other (Comment);Rolling walker   with quad tip, L hand orthosis with RW   Gait Pattern Step-through pattern;Decreased step length - left;Decreased hip/knee flexion - left;Decreased weight shift to left;Trunk rotated posteriorly on left;Decreased stance time - left    Ambulation Surface Level;Indoor      Neuro Re-ed    Neuro Re-ed  Details  in // bars: weight shifting R/L progressing to no UE support - multiple reps with use of mirror as visual feedback for midline and pt needing assist from therapist for weight shifting at hips vs. Rotating trunk and just with upper body., step taps with RLE to 4" step x10 reps with single UE support and assist for full weight shift towards L prior to stepping, attemped placing RLE on step and letting go for incr SLS on LLE but pt too fearful to perform, 2 x 10 reps forward step ups with LLE on 4" step with single UE support (pt reporting LLE pain after performing step ups)                 PT Education - 06/21/20 1333    Education Details discussed with pt's daughter at end of session (when coming to pick pt up) to ambulate in with pt at next session.    Person(s) Educated Patient;Child(ren)    Methods Explanation    Comprehension Verbalized understanding            PT Short Term Goals - 06/04/20 0942      PT SHORT TERM GOAL #1   Title Pt will receive L AFO for functional gait, mobility, and transfers. ALL STGS DUE 06/04/20    Baseline Pt has received left AFO    Time 4    Period Weeks    Status Achieved    Target Date 06/04/20      PT SHORT TERM GOAL #2   Title Pt will perform stand step transfers with min guard consistently in order to decr caregiver burder.    Baseline stand/pivot w/c to mat supervision. Daughter reports she is only standing by for safety at home with transfers.    Time 4    Period Weeks    Status Achieved      PT SHORT TERM GOAL #3   Title Pt will ambulate with L AFO and LRAD with supervision for 115' in order to improve household mobility.    Baseline CGA/min assist with gait with RW with left hand grip attachment and left AFO    Time 4    Period Weeks    Status Partially Met      PT SHORT TERM GOAL #4   Title Pt will perform all bed mobility with supervision in order to decr caregiver burden.    Baseline needs min A for rolling to L and  pushing up to sit, 06/04/20 rolling and sit to/from supine supervision  Time 4    Period Weeks    Status Achieved      PT SHORT TERM GOAL #5   Title Pt will undergo assessment of gait speed when appropriate - with LTG to be written    Baseline assessed on 06/04/20 at was 0.47ms    Time 4    Period Weeks    Status Achieved             PT Long Term Goals - 06/21/20 1335      PT LONG TERM GOAL #1   Title Pt will perform stand step transfers with no AD with mod I in order to decr caregiver burden. ALL LTGS DUE 07/02/20    Baseline pt needing min A-  places RUE on therapist for balance.    Time 8    Period Weeks    Status On-going      PT LONG TERM GOAL #2   Title Pt will tolerate static standing for at least 5 minutes with no UE support with supervision in order to improve tolerance for ADLs.    Baseline able to tolerate dynamic standing activities for 5 minutes for stepping with RLE with mod A from therapist    Time 8    Period Weeks    Status New      PT LONG TERM GOAL #3   Title Pt will ambulate at least 230' over level indoor/unlevel outdoor surfaces with LRAD with L AFO with min guard in order to improve functional mobility.    Baseline ambulated 230' with SPC with quad tip with min guard/min A for weight shifting    Time 8    Period Weeks    Status On-going      PT LONG TERM GOAL #4   Title Pt will perform at least 5 sit <> stand transfers from mat table and standard height chair with mod I with no UE support in order to demo improved functional transfers    Time 8    Period Weeks    Status On-going      PT LONG TERM GOAL #5   Title Pt and pt's daughter will be independent with final HEP for weight bearing/lower extremity stretching/strengthening.    Time 8    Period Weeks    Status On-going      PT LONG TERM GOAL #6   Title Pt will increase gait speed from 0.255m to >0.3229mfor improved gait safety and mobility.    Baseline 0.61m58mn 06/04/20    Time 8     Period Weeks    Status New                 Plan - 06/21/20 1347    Clinical Impression Statement 10th visit progress note: Today's skilled session focused on gait training with both RW and SPC with quad tip and NMR for weight shifting towards LLE. 9mm 17me lift added onto shoe with coban throughout session and shoe cover for improved foot clearance.  Pt reports ambulating some at home with RW, but not very consistently. Discussed with daughter at end of session importance of walking more frequently at home and walking into next session with pt.  [facility]. Pt did well with improving step length with RLE with verbal cues. Needing assist throughout gait for anterior pelvic rotation and for weight shifting through LLE. Pt needing min A for stand pivot transfers for balance and  for weight shifting towards LLE to take a larger step with RLE. Pt fearful to perform standing balance activities today without UE support when shifting towards LLE. Will continue to progress towards LTGs.    Personal Factors and Comorbidities Comorbidity 3+;Past/Current Experience;Time since onset of injury/illness/exacerbation;Behavior Pattern    Comorbidities PMH significant for large right MCA CVA, HTN, T2DM, HLD, hx of Left foot fifth metatarsal fracture with osteopenia, asthma.    Examination-Activity Limitations Stand;Locomotion Level;Transfers;Toileting;Continence;Dressing    Examination-Participation Restrictions Community Activity    Stability/Clinical Decision Making Evolving/Moderate complexity    Rehab Potential Good    PT Frequency 2x / week    PT Duration 8 weeks    PT Treatment/Interventions ADLs/Self Care Home Management;Aquatic Therapy;Electrical Stimulation;DME Instruction;Gait training;Functional mobility training;Neuromuscular re-education;Balance training;Therapeutic exercise;Therapeutic activities;Patient/family education;Orthotic Fit/Training;Passive  range of motion;Energy conservation    PT Next Visit Plan Did they see the PT at [facility]? Daughter was trying to schedule appointment for Monday. gait training with daughter with AFO for home. continue with stepping activities with RLE(with weight shifting towards LLE), gait training with no AD and with SPC with quad tip - with focus on facilitation for L weight shift and hip extension during terminal stance.    Consulted and Agree with Plan of Care Patient;Family member/caregiver           Patient will benefit from skilled therapeutic intervention in order to improve the following deficits and impairments:  Abnormal gait, Decreased activity tolerance, Decreased coordination, Decreased balance, Decreased cognition, Decreased endurance, Decreased knowledge of use of DME, Decreased range of motion, Decreased strength, Difficulty walking, Impaired tone, Impaired UE functional use, Postural dysfunction, Impaired sensation  Visit Diagnosis: Muscle weakness (generalized)  Other symptoms and signs involving the nervous system  Abnormal posture  Other abnormalities of gait and mobility     Problem List Patient Active Problem List   Diagnosis Date Noted  . Cerebrovascular accident (CVA) due to thrombosis of precerebral artery ([place]) 11/30/2019  . 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 Spastic hemiparesis affecting nondominant side ([place]) 11/30/2019  . Chronic bilateral low back pain with sciatica 11/30/2019  . Metatarsal fracture 09/22/2019  . Depression 09/22/2019  . Asthma 09/22/2019  . Palliative care by specialist   . Goals of care, counseling/discussion   . Encephalopathy   . Acute CVA (cerebrovascular accident) ([place]) 09/14/2019  . Acute arterial ischemic stroke, multifocal, anterior circulation, right ([place]) 09/14/2019  . Severe comorbid illness   . AMS (altered mental status) 09/13/2019  . Controlled diabetes mellitus type 2  with complications ([place]) 88/91/6945  . Hyperlipidemia 09/02/2017  . Hypertension 09/02/2017  . Chest pain 09/02/2017    Arliss Journey, PT, DPT  06/21/2020, 1:48 PM  [facility] [address] [place], [place], [address] Phone: [phone]   Fax:  [phone]  Name: Keyera Hattabaugh MRN: [id number] Date of Birth: 1961-03-14

## 2020-06-21 NOTE — Therapy (Signed)
[facility] [address] [place], [place], [address] Phone: [phone]   Fax:  [phone]  Occupational Therapy Treatment  Patient Details  Name: Christina Evans MRN: [id number] Date of Birth: 09/27/1961 Referring Provider (OT): Dr. Pearlean Brownie   Encounter Date: 06/21/2020   OT End of Session - 06/21/20 0821    Visit Number 26    Number of Visits 31    Date for OT Re-Evaluation 06/24/20    Authorization Type [employer]HC medicare and medicaid as secondary. Pt will need PN every 10th visit    Authorization Time Period 90 days - 07/22/2020 (renwewed on 04/29/2020)    Authorization - Visit Number 26    Authorization - Number of Visits 30    OT Start Time [phone]    OT Stop Time 0845    OT Time Calculation (min) 38 min    Activity Tolerance Patient tolerated treatment well           Past Medical History:  Diagnosis Date  . Asthma   . CVA (cerebral vascular accident) (HCC)   . Diabetes mellitus without complication [facility])     Past Surgical History:  Procedure Laterality Date  . IR ANGIO INTRA EXTRACRAN SEL COM CAROTID INNOMINATE BILAT MOD SED  09/15/2019  . IR ANGIO VERTEBRAL SEL VERTEBRAL UNI L MOD SED  09/15/2019    There were no vitals filed for this visit.   Subjective Assessment - 06/21/20 0820    Subjective  denies pain    Currently in Pain? No/denies                       Treatment: Seated on mat, self stretch reach for the floor, followed by weight bearing through large physioball for closed chain shoulder flexion then abduction, and circumduction, min-mod facillitation P/ROM stretch forearm supination/ pronation. Pt amb to table with walker, mod facilitation for LUE grasp Standing to set table then to wipe table with minguard. Box/ blocks- 2 blocks with LUE           OT Short Term Goals - 05/22/20 1218      OT SHORT TERM GOAL #1   Title Pt and dtr will be mod  I with HEP to address ROM  for RUE - 03/20/2020    Status Achieved      OT SHORT TERM GOAL #2   Title Pt and dtr will verbalize understanding for AE for cutting food on plate    Status Achieved      OT SHORT TERM GOAL #3   Title Pt will be mod I with washing face and set up for bushing teeth.    Status Achieved      OT SHORT TERM GOAL #4   Title Pt will be mod a for UB dressing    Status Achieved      OT SHORT TERM GOAL #5   Title Pt will be mod a for LB dressing    Status Achieved      OT SHORT TERM GOAL #6   Title Pt will require mod a to hike pants    Status Achieved      OT SHORT TERM GOAL #7   Title Pt and dtr will verbalize understanding of toileting schedule to address incontinence    Status Achieved      OT SHORT TERM GOAL #8   Title Pt and dtr will be mod with splint wear and care (resting hand splint at night)  Status Achieved      OT SHORT TERM GOAL  #9   TITLE Pt will demonstrate ability to set table and wipe table (with dishes/silverware set on table) with supervision - 05/27/2020    Status On-going             OT Long Term Goals - 06/21/20 0826      OT LONG TERM GOAL #1   Title Pt and dtr will be mod I with upgraded home activities program to include focus ADL's, transitional movements and balance. - 06/24/2020    Status On-going      OT LONG TERM GOAL #2   Title Pt will be supervision for toilet transfers    Status Achieved      OT LONG TERM GOAL #3   Title Pt will be no more than min a for hiking pants and toilet hygiene    Status Achieved      OT LONG TERM GOAL #4   Title Pt will tolerate 110* of L shoulder flexion and 90* abduction during HEP for ease with ADL's with more than " a little " pain based on faces.    Status Achieved      OT LONG TERM GOAL #5   Title Pt will be supervision for simple snack prep    Status On-going      OT LONG TERM GOAL #6   Title Pt and dtr will complete FOTO outcome scale    Status On-going                 Plan - 06/21/20  3474    Clinical Impression Statement Pt reports she has been stretching a little more at home. Pt remains limited by cognitve deficits and spasticity.    OT Occupational Profile and History Detailed Assessment- Review of Records and additional review of physical, cognitive, psychosocial history related to current functional performance    Occupational performance deficits (Please refer to evaluation for details): ADL's;IADL's;Work;Leisure;Social Participation    Body Structure / Function / Physical Skills ADL;Balance;Continence;Endurance;GMC;IADL;Mobility;Sensation;ROM;Pain;Strength;Tone;UE functional use    Rehab Potential Good    Clinical Decision Making Multiple treatment options, significant modification of task necessary    Comorbidities Affecting Occupational Performance: Presence of comorbidities impacting occupational performance    Comorbidities impacting occupational performance description: see above    Modification or Assistance to Complete Evaluation  Max significant modification of tasks or assist is necessary to complete    OT Frequency 2x / week    OT Duration 8 weeks    OT Treatment/Interventions Self-care/ADL training;Aquatic Therapy;Electrical Stimulation;Ultrasound;Moist Heat;Therapeutic exercise;Neuromuscular education;Manual Therapy;Functional Mobility Training;DME and/or AE instruction;Passive range of motion;Patient/family education;Cognitive remediation/compensation;Therapeutic activities;Splinting;Balance training    Plan NMR for LUE/trunk,  functional mobility,    Consulted and Agree with Plan of Care Patient;Family member/caregiver    Family Member Consulted dtr           Patient will benefit from skilled therapeutic intervention in order to improve the following deficits and impairments:   Body Structure / Function / Physical Skills: ADL, Balance, Continence, Endurance, GMC, IADL, Mobility, Sensation, ROM, Pain, Strength, Tone, UE functional use       Visit  Diagnosis: Muscle weakness (generalized)  Other symptoms and signs involving the nervous system  Abnormal posture  Spastic hemiplegia of left nondominant side as late effect of cerebral infarction [facility])    Problem List Patient Active Problem List   Diagnosis Date Noted  . Cerebrovascular accident (CVA) due to thrombosis of precerebral artery (HCC)  11/30/2019  . 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 Spastic hemiparesis affecting nondominant side (HCC) 11/30/2019  . Chronic bilateral low back pain with sciatica 11/30/2019  . Metatarsal fracture 09/22/2019  . Depression 09/22/2019  . Asthma 09/22/2019  . Palliative care by specialist   . Goals of care, counseling/discussion   . Encephalopathy   . Acute CVA (cerebrovascular accident) (HCC) 09/14/2019  . Acute arterial ischemic stroke, multifocal, anterior circulation, right (HCC) 09/14/2019  . Severe comorbid illness   . AMS (altered mental status) 09/13/2019  . 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  . Hyperlipidemia 09/02/2017  . Hypertension 09/02/2017  . Chest pain 09/02/2017    Yigit Norkus 06/21/2020, 8:43 AM Keene Breath, OTR/L [phone] Phone: [phone] 8:55 AM 06/21/20 [facility] [address] [place], [place], [address] Phone: [phone]   Fax:  [phone]  Name: Madinah Quarry MRN: [id number] Date of Birth: [birth year]

## 2020-06-25 ENCOUNTER — Ambulatory Visit: Payer: Medicare Other | Admitting: Occupational Therapy

## 2020-06-25 ENCOUNTER — Ambulatory Visit: Payer: Medicare Other | Admitting: Physical Therapy

## 2020-06-25 ENCOUNTER — Ambulatory Visit: Payer: Medicare Other | Admitting: Speech Pathology

## 2020-06-27 ENCOUNTER — Other Ambulatory Visit: Payer: Self-pay

## 2020-06-27 ENCOUNTER — Ambulatory Visit: Payer: Medicare Other | Admitting: Speech Pathology

## 2020-06-27 ENCOUNTER — Ambulatory Visit: Payer: Medicare Other | Admitting: Physical Therapy

## 2020-06-27 ENCOUNTER — Ambulatory Visit: Payer: Medicare Other | Attending: Physical Medicine & Rehabilitation | Admitting: Occupational Therapy

## 2020-06-27 DIAGNOSIS — I69354 Hemiplegia and hemiparesis following cerebral infarction affecting left non-dominant side: Secondary | ICD-10-CM | POA: Insufficient documentation

## 2020-06-27 DIAGNOSIS — M6281 Muscle weakness (generalized): Secondary | ICD-10-CM

## 2020-06-27 DIAGNOSIS — R2681 Unsteadiness on feet: Secondary | ICD-10-CM | POA: Diagnosis present

## 2020-06-27 DIAGNOSIS — M25622 Stiffness of left elbow, not elsewhere classified: Secondary | ICD-10-CM | POA: Diagnosis present

## 2020-06-27 DIAGNOSIS — R29818 Other symptoms and signs involving the nervous system: Secondary | ICD-10-CM | POA: Diagnosis present

## 2020-06-27 DIAGNOSIS — R293 Abnormal posture: Secondary | ICD-10-CM | POA: Insufficient documentation

## 2020-06-27 DIAGNOSIS — R2689 Other abnormalities of gait and mobility: Secondary | ICD-10-CM | POA: Diagnosis present

## 2020-06-27 DIAGNOSIS — I69318 Other symptoms and signs involving cognitive functions following cerebral infarction: Secondary | ICD-10-CM

## 2020-06-27 NOTE — Therapy (Signed)
[facility] [address] [place], [place], [address] Phone: [phone]   Fax:  [phone]  Occupational Therapy Treatment  Patient Details  Name: Christina Evans MRN: [id number] Date of Birth: November 15, 1961 Referring Provider (OT): Dr. Leonie Man   Encounter Date: 06/27/2020   OT End of Session - 06/27/20 0811    Visit Number 27    Number of Visits 37    Date for OT Re-Evaluation 06/24/20    Authorization Type [employer] medicare and medicaid as secondary. Pt will need PN every 10th visit    Authorization Time Period renewed 06/27/20 for 5 week POC, cert for 90 days    Authorization - Visit Number 27   renewal completed on 27   Authorization - Number of Visits 37    Progress Note Due on Visit 62    OT Start Time 0804    OT Stop Time 0837   pt not feeling well   OT Time Calculation (min) 33 min    Activity Tolerance Patient tolerated treatment well    Behavior During Therapy WFL for tasks assessed/performed           Past Medical History:  Diagnosis Date  . Asthma   . CVA (cerebral vascular accident) ([place])   . Diabetes mellitus without complication [facility])     Past Surgical History:  Procedure Laterality Date  . IR ANGIO INTRA EXTRACRAN SEL COM CAROTID INNOMINATE BILAT MOD SED  09/15/2019  . IR ANGIO VERTEBRAL SEL VERTEBRAL UNI L MOD SED  09/15/2019    There were no vitals filed for this visit.   Subjective Assessment - 06/27/20 0811    Subjective  denies pain    Currently in Pain? No/denies                    Treatment: Discussed progress towards goals and plans to renew through end of August with pt/ dtr. Pt walked into therapy today with her cane and min A. Therapist recommends use of walker for longer distances and cane at home. Pt continues to have limited carryover at home and she stays in her room most of the day.  Tableslides for shoulder flexion, closed chain followed by P/ROM  pronation/supination. Functional grasp / release of a cup with mod facilitation. Picking up small medicine bottles and releasing with LUE, min-mod facilitation. Pt was able to pick up 1 block and relase for box/ blocks. A/ROM LUE shoulder flexion/ scaption:80, supination to neutral, grossly 50% finger ext, and 80 % finger flexion. Pt left session early as she was incontinent of bowel               OT Short Term Goals - 06/27/20 0902      OT SHORT TERM GOAL #1   Title Pt and dtr will be mod  I with HEP to address ROM for RUE - 03/20/2020    Status Achieved      OT SHORT TERM GOAL #2   Title Pt and dtr will verbalize understanding for AE for cutting food on plate    Status Achieved      OT SHORT TERM GOAL #3   Title Pt will be mod I with washing face and set up for bushing teeth.    Status Achieved      OT SHORT TERM GOAL #4   Title Pt will be mod a for UB dressing    Status Achieved      OT SHORT TERM GOAL #  5   Title Pt will be mod a for LB dressing    Status Achieved      OT SHORT TERM GOAL #6   Title Pt will require mod a to hike pants    Status Achieved      OT SHORT TERM GOAL #7   Title Pt and dtr will verbalize understanding of toileting schedule to address incontinence    Status Achieved      OT SHORT TERM GOAL #8   Title Pt and dtr will be mod with splint wear and care (resting hand splint at night)    Status Achieved      OT SHORT TERM GOAL  #9   TITLE Pt will demonstrate ability to set table and wipe table (with dishes/silverware set on table) with supervision - 05/27/2020    Status Not Met   minguard in standing            OT Long Term Goals - 06/27/20 0902      OT LONG TERM GOAL #1   Title Pt and dtr will be mod I with upgraded home activities program to include focus ADL's, transitional movements and balance. -    Time 5    Period Weeks    Status On-going   needs reinforcement for carryover     OT LONG TERM GOAL #2   Title Pt will be  supervision for toilet transfers    Status Achieved      OT LONG TERM GOAL #3   Title Pt will be no more than min a for hiking pants and toilet hygiene    Status Achieved      OT LONG TERM GOAL #4   Title Pt will tolerate 110* of L shoulder flexion and 90* abduction during HEP for ease with ADL's with more than " a little " pain based on faces.    Status Achieved      OT LONG TERM GOAL #5   Title Pt will be supervision for simple snack prep    Status On-going   Pt is not performing consistently     Long Term Additional Goals   Additional Long Term Goals Yes      OT LONG TERM GOAL #6   Title Pt and dtr will complete FOTO outcome scale    Status On-going   will be completed at d/c     OT LONG TERM GOAL #7   Title Pt will participate in at least 1 home management task per day with no more than min A    Baseline currently not conistently assisting her dtr.    Time 5    Status On-going      OT LONG TERM GOAL #8   Title Pt will use her LUE as a stabilizer/ gross A 10% of the time for ADLs/ IADLs.    Baseline barely using LUE at all    Time 5    Period Weeks    Status New      OT LONG TERM GOAL  #9   TITLE Pt wil demonstrate ability to grasp/ relase a cup with no more than min A    Baseline mod facillitation/ assist    Time 5    Period Weeks    Status New                 Plan - 06/27/20 0017    Clinical Impression Statement Pt is progressing slowly towards goals. Pt's cognition has been a  factor impeding her progress. Pt can benefit from continued skilled occupational therapy in order to maximize pt's functional use of LUE in order to maximize safety and I with pt's ADLs/ IADLs.    OT Occupational Profile and History Detailed Assessment- Review of Records and additional review of physical, cognitive, psychosocial history related to current functional performance    Occupational performance deficits (Please refer to evaluation for details):  ADL's;IADL's;Work;Leisure;Social Participation    Body Structure / Function / Physical Skills ADL;Balance;Continence;Endurance;GMC;IADL;Mobility;Sensation;ROM;Pain;Strength;Tone;UE functional use    Rehab Potential Good    Clinical Decision Making Multiple treatment options, significant modification of task necessary    Comorbidities Affecting Occupational Performance: Presence of comorbidities impacting occupational performance    Comorbidities impacting occupational performance description: see above    Modification or Assistance to Complete Evaluation  Max significant modification of tasks or assist is necessary to complete    OT Frequency 2x / week    OT Duration --   5 weeks   OT Treatment/Interventions Self-care/ADL training;Aquatic Therapy;Electrical Stimulation;Ultrasound;Moist Heat;Therapeutic exercise;Neuromuscular education;Manual Therapy;Functional Mobility Training;DME and/or AE instruction;Passive range of motion;Patient/family education;Cognitive remediation/compensation;Therapeutic activities;Splinting;Balance training    Plan continue to work towards updated goals    Consulted and Agree with Plan of Care Patient;Family member/caregiver    Family Member Consulted dtr           Patient will benefit from skilled therapeutic intervention in order to improve the following deficits and impairments:   Body Structure / Function / Physical Skills: ADL, Balance, Continence, Endurance, GMC, IADL, Mobility, Sensation, ROM, Pain, Strength, Tone, UE functional use       Visit Diagnosis: Muscle weakness (generalized) - Plan: Ot plan of care cert/re-cert  Other symptoms and signs involving the nervous system - Plan: Ot plan of care cert/re-cert  Abnormal posture - Plan: Ot plan of care cert/re-cert  Other abnormalities of gait and mobility - Plan: Ot plan of care cert/re-cert  Spastic hemiplegia of left nondominant side as late effect of cerebral infarction [facility]) - Plan: Ot plan of  care cert/re-cert  Stiffness of left upper arm joint - Plan: Ot plan of care cert/re-cert  Other symptoms and signs involving cognitive functions following cerebral infarction - Plan: Ot plan of care cert/re-cert  Unsteadiness on feet - Plan: Ot plan of care cert/re-cert    Problem List Patient Active Problem List   Diagnosis Date Noted  . Cerebrovascular accident (CVA) due to thrombosis of precerebral artery ([place]) 11/30/2019  . 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 Spastic hemiparesis affecting nondominant side ([place]) 11/30/2019  . Chronic bilateral low back pain with sciatica 11/30/2019  . Metatarsal fracture 09/22/2019  . Depression 09/22/2019  . Asthma 09/22/2019  . Palliative care by specialist   . Goals of care, counseling/discussion   . Encephalopathy   . Acute CVA (cerebrovascular accident) ([place]) 09/14/2019  . Acute arterial ischemic stroke, multifocal, anterior circulation, right ([place]) 09/14/2019  . Severe comorbid illness   . AMS (altered mental status) 09/13/2019  . Controlled diabetes mellitus type 2 with complications ([place]) 53/64/6803  . Hyperlipidemia 09/02/2017  . Hypertension 09/02/2017  . Chest pain 09/02/2017    Ciarah Peace 06/27/2020, 9:28 AM  [facility] [address] [place], [place], [address] Phone: [phone]   Fax:  [phone]  Name: Christina Evans MRN: [id number] Date of Birth: 24-Jan-1961

## 2020-07-02 ENCOUNTER — Ambulatory Visit: Payer: Medicare Other | Admitting: Occupational Therapy

## 2020-07-02 ENCOUNTER — Ambulatory Visit: Payer: Medicare Other | Admitting: Speech Pathology

## 2020-07-02 ENCOUNTER — Ambulatory Visit: Payer: Medicare Other

## 2020-07-04 ENCOUNTER — Encounter: Payer: Self-pay | Admitting: Occupational Therapy

## 2020-07-04 ENCOUNTER — Ambulatory Visit: Payer: Medicare Other | Admitting: Speech Pathology

## 2020-07-04 ENCOUNTER — Other Ambulatory Visit: Payer: Self-pay

## 2020-07-04 ENCOUNTER — Ambulatory Visit: Payer: Medicare Other | Admitting: Physical Therapy

## 2020-07-04 ENCOUNTER — Ambulatory Visit: Payer: Medicare Other | Admitting: Occupational Therapy

## 2020-07-04 DIAGNOSIS — R29818 Other symptoms and signs involving the nervous system: Secondary | ICD-10-CM

## 2020-07-04 DIAGNOSIS — I69354 Hemiplegia and hemiparesis following cerebral infarction affecting left non-dominant side: Secondary | ICD-10-CM

## 2020-07-04 DIAGNOSIS — R2689 Other abnormalities of gait and mobility: Secondary | ICD-10-CM

## 2020-07-04 DIAGNOSIS — M25622 Stiffness of left elbow, not elsewhere classified: Secondary | ICD-10-CM

## 2020-07-04 DIAGNOSIS — R2681 Unsteadiness on feet: Secondary | ICD-10-CM

## 2020-07-04 DIAGNOSIS — M6281 Muscle weakness (generalized): Secondary | ICD-10-CM | POA: Diagnosis not present

## 2020-07-04 NOTE — Therapy (Signed)
[facility] [address] [place], [place], [address] Phone: [phone]   Fax:  [phone]  Physical Therapy Treatment/Re-Cert  Patient Details  Name: Christina Evans MRN: [id number] Date of Birth: [birth year] Referring Provider (PT): Garvin Fila, MD   Encounter Date: 07/04/2020   PT End of Session - 07/04/20 2013    Visit Number 30    Number of Visits 38    Date for PT Re-Evaluation 09/02/20   written for 4 week POC   Authorization Type [employer] Medicare    Progress Note Due on Visit 29   did progress note on visit 19   PT Start Time 0933    PT Stop Time 1014    PT Time Calculation (min) 41 min    Equipment Utilized During Treatment Gait belt;Other (comment)   shoe build up on left side   Activity Tolerance Patient tolerated treatment well    Behavior During Therapy WFL for tasks assessed/performed           Past Medical History:  Diagnosis Date   Asthma    CVA (cerebral vascular accident) ([facility])    Diabetes mellitus without complication ([place])     Past Surgical History:  Procedure Laterality Date   IR ANGIO INTRA EXTRACRAN SEL COM CAROTID INNOMINATE BILAT MOD SED  09/15/2019   IR ANGIO VERTEBRAL SEL VERTEBRAL UNI L MOD SED  09/15/2019    There were no vitals filed for this visit.   Subjective Assessment - 07/04/20 0934    Subjective Trying to work on some more walking at home.    Pertinent History PMH significant for large right MCA CVA, HTN, T2DM, HLD, hx of Left foot fifth metatarsal fracture with osteopenia, asthma.    Limitations Standing;Walking    How long can you stand comfortably? 1 minute (when daughter is changing her when she is wearing diapers).    How long can you walk comfortably? a few feet.    Diagnostic tests MRI brain with moderate sized acute to early subacute left ACA territory infarct andlarge chronic right MCA territory infarct.    Patient Stated Goals wants to get better,  wants to be independent.    Currently in Pain? No/denies              [facility] PT Assessment - 07/04/20 0001      Assessment   Medical Diagnosis R ACA CVA, late effects of R MCA CVA    Referring Provider (PT) Garvin Fila, MD    Onset Date/Surgical Date 09/13/19   date of 2nd stroke     Prior Function   Level of Independence Independent                         [facility] Adult PT Treatment/Exercise - 07/04/20 0001      Transfers   Transfers Sit to Stand;Stand to Sit;Stand Pivot Transfers    Sit to Stand 5: Supervision;4: Min guard    Five time sit to stand comments  attempted without UE support for 5 reps, pt able to perform 3 reps before reporting feeling fatigued and unable to stand up, cues for pt to hold L arm with R hand and then weight shift forward to stand, attempted 2 sets, plus additional sit <> stands throughout session    Stand to Sit 5: Supervision    Stand to Sit Details when ambulating with RW cues to remove hand from L hand orthosis prior  to sitting, manual cues to keep weight towards L      Ambulation/Gait   Ambulation/Gait Yes    Ambulation/Gait Assistance 5: Supervision;4: Min guard;4: Min assist    Ambulation/Gait Assistance Details gait speed assessed without shoe lift and shoe cover on. 41m lift and shoe cover used for remainder of gait. min A facilitation from therapist for weight shifting to LLE and for pelvic anterior rotation during gait. pt with improvement of keeping LUE in hand orthosis during gait     Ambulation Distance (Feet) 230 Feet   with SPC plus 100' with RW   Assistive device Straight cane;Other (Comment);Rolling walker   straight cane with 3 prong tip   Gait Pattern Step-through pattern;Decreased step length - left;Decreased hip/knee flexion - left;Decreased weight shift to left;Trunk rotated posteriorly on left;Decreased stance time - left    Ambulation Surface Level;Indoor    Gait velocity 37.12 sec with RW = .27 m/s, 39.82  seconds = . 25 m/s with SPC with 3 prong tip        Neuro Re-ed    Neuro Re-ed Details  weight shifting to LLE and tapping 4" block with RLE, min A from therapist for weight shifting, x20 reps, beginning with RUE support on chair and then progressing to no UE support, pt needing max encouragement at times to perform                  PT Education - 07/04/20 2012    Education Details POC going forward - continuing for 2x week for 2 weeks, taking a break and then continuing for another 1-2 weeks after Botox    Person(s) Educated Patient;Child(ren)    Methods Explanation    Comprehension Verbalized understanding            PT Short Term Goals - 07/04/20 2015      PT SHORT TERM GOAL #1   Title ALL STGS = LTGS             PT Long Term Goals - 07/04/20 0941      PT LONG TERM GOAL #1   Title Pt will perform stand step transfers with no AD with mod I in order to decr caregiver burden. ALL LTGS DUE 07/02/20    Baseline not tested today - pt ambulated into clinic with RW    Time 8    Period Weeks    Status Deferred      PT LONG TERM GOAL #2   Title Pt will tolerate static standing for at least 5 minutes with no UE support with supervision in order to improve tolerance for ADLs.    Baseline able to tolerate dynamic standing activities for 5 minutes for stepping with single to no UE support with min A    Time 8    Period Weeks    Status Partially Met      PT LONG TERM GOAL #3   Title Pt will ambulate at least 230' over level indoor/unlevel outdoor surfaces with LRAD with L AFO with min guard in order to improve functional mobility.    Baseline ambulated 230' with SPC with 3 prong tip - able to ambulate with min guard, but therapist providing min A for weight shift, did not test outdoor surfaces today    Time 8    Period Weeks    Status Partially Met      PT LONG TERM GOAL #4   Title Pt will perform at least 5 sit <> stand  transfers from mat table and standard height chair  with mod I with no UE support in order to demo improved functional transfers    Baseline only able to perform 3 reps, needing supervision, pt fatigues easily    Time 8    Period Weeks    Status Not Met      PT LONG TERM GOAL #5   Title Pt and pt's daughter will be independent with final HEP for weight bearing/lower extremity stretching/strengthening.    Baseline unable to assess today    Time 8    Period Weeks    Status Deferred      PT LONG TERM GOAL #6   Title Pt will increase gait speed from 0.61ms to >0.382m for improved gait safety and mobility.    Baseline 0.2257mon 06/04/20, 37.12 sec with RW = .27 m/s with RW, 39.82 seconds = . 25 m/s with SPC with 3 prong tip    Time 8    Period Weeks    Status Not Met          Revised/on-going LTGs for re-cert:     PT Long Term Goals - 07/04/20 0941      PT LONG TERM GOAL #1   Title Pt will increase gait speed from  >0.80m36mor improved gait safety and mobility. ALL LTGS DUE 08/22/20    Baseline 0.69m/43m 06/04/20, 37.12 sec with RW = .27 m/s with RW, 39.82 seconds = . 25 m/s with SPC with 3 prong tip    Time 7   due to pt being on hold   Period Weeks    Status Deferred    Target Date 08/22/20      PT LONG TERM GOAL #2   Title Pt will tolerate static standing for at least 5 minutes with no UE support with supervision in order to improve tolerance for ADLs.    Baseline able to tolerate dynamic standing activities for 5 minutes for stepping with single to no UE support with min A    Time 7    Period Weeks    Status On-going      PT LONG TERM GOAL #3   Title Pt will ambulate at least 230' over level indoor/unlevel outdoor surfaces with LRAD with L AFO with min guard in order to improve functional mobility.    Baseline ambulated 230' with SPC with 3 prong tip - able to ambulate with min guard, but therapist providing min A for weight shift, did not test outdoor surfaces today    Time 7    Period Weeks    Status On-going       PT LONG TERM GOAL #4   Title Pt and pt's daughter will be independent with final HEP for weight bearing/lower extremity stretching/strengthening.    Baseline --    Time 7    Period Weeks    Status On-going      PT LONG TERM GOAL #5   Title --    Baseline --    Time --    Period --    Status --      PT LONG TERM GOAL #6   Title --    Baseline --    Time --    Period --    Status --                 Plan - 07/04/20 2015    Clinical Impression Statement Focus of today's skilled session was assessing pt's  LTGs for re-cert. Pt partially met LTGs #2 and #3. Pt able to ambulate 230' with SPC with 3 prong tip today over indoor level surfaces, can perform with supervision, but PT was providing min A for incr weight shift and weight bearing through LLE. Did not assess gait outdoors today. Pt unable to perform 5x sit <> stand without UE support, able to perform 3 reps before pt reporting she is fatigued. Pt's gait speed with RW at .27 m/s and .25 m/s with SPC with 3 prong tip indicates pt is a household ambulator and is at a higher risk for falls. Will re-cert for an additional 2x week for 4 weeks - will go towards end of August and go on hold for a couple of weeks until pt gets botox. Pt will continue to benefit from skilled PT in order to address deficits listed above to decr risk of falls and improved functional mobility and independence. LTGs revised and updated as appropriate.    Personal Factors and Comorbidities Comorbidity 3+;Past/Current Experience;Time since onset of injury/illness/exacerbation;Behavior Pattern    Comorbidities PMH significant for large right MCA CVA, HTN, T2DM, HLD, hx of Left foot fifth metatarsal fracture with osteopenia, asthma.    Examination-Activity Limitations Stand;Locomotion Level;Transfers;Toileting;Continence;Dressing    Examination-Participation Restrictions Community Activity    Stability/Clinical Decision Making Evolving/Moderate complexity    Rehab  Potential Good    PT Frequency 2x / week    PT Duration 4 weeks    PT Treatment/Interventions ADLs/Self Care Home Management;Aquatic Therapy;Electrical Stimulation;DME Instruction;Gait training;Functional mobility training;Neuromuscular re-education;Balance training;Therapeutic exercise;Therapeutic activities;Patient/family education;Orthotic Fit/Training;Passive range of motion;Energy conservation    PT Next Visit Plan Did they see the PT at [facility]? info about getting toe cap and shoe lift.  gait training. continue with stepping activities with RLE(with weight shifting towards LLE), gait training with no AD and with SPC with quad tip - with focus on facilitation for L weight shift and hip extension during terminal stance.    Consulted and Agree with Plan of Care Patient;Family member/caregiver           Patient will benefit from skilled therapeutic intervention in order to improve the following deficits and impairments:  Abnormal gait, Decreased activity tolerance, Decreased coordination, Decreased balance, Decreased cognition, Decreased endurance, Decreased knowledge of use of DME, Decreased range of motion, Decreased strength, Difficulty walking, Impaired tone, Impaired UE functional use, Postural dysfunction, Impaired sensation  Visit Diagnosis: Muscle weakness (generalized)  Other symptoms and signs involving the nervous system  Spastic hemiplegia of left nondominant side as late effect of cerebral infarction (HCC)  Other abnormalities of gait and mobility  Unsteadiness on feet     Problem List Patient Active Problem List   Diagnosis Date Noted   Cerebrovascular accident (CVA) due to thrombosis of precerebral artery ([place]) 11/30/2019   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 11/30/2019   Spastic hemiparesis affecting nondominant side ([place]) 11/30/2019   Chronic bilateral low back pain with sciatica 11/30/2019   Metatarsal fracture  09/22/2019   Depression 09/22/2019   Asthma 09/22/2019   Palliative care by specialist    Goals of care, counseling/discussion    Encephalopathy    Acute CVA (cerebrovascular accident) ([place]) 09/14/2019   Acute arterial ischemic stroke, multifocal, anterior circulation, right ([place]) 09/14/2019   Severe comorbid illness    AMS (altered mental status) 09/13/2019   Controlled diabetes mellitus type 2 with complications ([place]) 82/95/6213   Hyperlipidemia 09/02/2017   Hypertension 09/02/2017   Chest  pain 09/02/2017    Arliss Journey, PT, DPT  07/04/2020, 8:25 PM  [facility] [address] [place], [place], [address] Phone: [phone]   Fax:  [phone]  Name: Beyonca Wisz MRN: [id number] Date of Birth: [birth year]

## 2020-07-04 NOTE — Therapy (Signed)
[facility] [address] [place], [place], [address] Phone: [phone]   Fax:  [phone]  Occupational Therapy Treatment  Patient Details  Name: Christina Evans MRN: [id number] Date of Birth: 1961/01/22 Referring Provider (OT): Dr. Leonie Man   Encounter Date: 07/04/2020   OT End of Session - 07/04/20 1031    Visit Number 28    Number of Visits 37    Date for OT Re-Evaluation 06/24/20    Authorization Type UHC medicare and medicaid as secondary. Pt will need PN every 10th visit    Authorization Time Period renewed 06/27/20 for 5 week POC, cert for 90 days    Authorization - Visit Number 28   renewal completed on 27   Authorization - Number of Visits 37    Progress Note Due on Visit 62    OT Start Time 0848    OT Stop Time 0930    OT Time Calculation (min) 42 min    Activity Tolerance Patient tolerated treatment well    Behavior During Therapy WFL for tasks assessed/performed           Past Medical History:  Diagnosis Date  . Asthma   . CVA (cerebral vascular accident) ([place])   . Diabetes mellitus without complication [facility])     Past Surgical History:  Procedure Laterality Date  . IR ANGIO INTRA EXTRACRAN SEL COM CAROTID INNOMINATE BILAT MOD SED  09/15/2019  . IR ANGIO VERTEBRAL SEL VERTEBRAL UNI L MOD SED  09/15/2019    There were no vitals filed for this visit.   Subjective Assessment - 07/04/20 1031    Subjective  denies pain    Currently in Pain? No/denies              Treatment: Pt walked in to clinic using her walker and hand splint, min facilitation for weightshift left to maintain weight through LUE. Tableslides for shoulder flexion and abduction then AA/ROM forearm supination/ pronation, NMES 50 pps 250 pw, 10 secs cycle, intensity 11 to finger and wrist extensors, min facilitation. Standing to perform functional grasp / release of wooden dowel pegs, mod facilitation for  release                    OT Short Term Goals - 06/27/20 0902      OT SHORT TERM GOAL #1   Title Pt and dtr will be mod  I with HEP to address ROM for RUE - 03/20/2020    Status Achieved      OT SHORT TERM GOAL #2   Title Pt and dtr will verbalize understanding for AE for cutting food on plate    Status Achieved      OT SHORT TERM GOAL #3   Title Pt will be mod I with washing face and set up for bushing teeth.    Status Achieved      OT SHORT TERM GOAL #4   Title Pt will be mod a for UB dressing    Status Achieved      OT SHORT TERM GOAL #5   Title Pt will be mod a for LB dressing    Status Achieved      OT SHORT TERM GOAL #6   Title Pt will require mod a to hike pants    Status Achieved      OT SHORT TERM GOAL #7   Title Pt and dtr will verbalize understanding of toileting schedule to address incontinence  Status Achieved      OT SHORT TERM GOAL #8   Title Pt and dtr will be mod with splint wear and care (resting hand splint at night)    Status Achieved      OT SHORT TERM GOAL  #9   TITLE Pt will demonstrate ability to set table and wipe table (with dishes/silverware set on table) with supervision - 05/27/2020    Status Not Met   minguard in standing            OT Long Term Goals - 06/27/20 0902      OT LONG TERM GOAL #1   Title Pt and dtr will be mod I with upgraded home activities program to include focus ADL's, transitional movements and balance. -    Time 5    Period Weeks    Status On-going   needs reinforcement for carryover     OT LONG TERM GOAL #2   Title Pt will be supervision for toilet transfers    Status Achieved      OT LONG TERM GOAL #3   Title Pt will be no more than min a for hiking pants and toilet hygiene    Status Achieved      OT LONG TERM GOAL #4   Title Pt will tolerate 110* of L shoulder flexion and 90* abduction during HEP for ease with ADL's with more than " a little " pain based on faces.    Status Achieved       OT LONG TERM GOAL #5   Title Pt will be supervision for simple snack prep    Status On-going   Pt is not performing consistently     Long Term Additional Goals   Additional Long Term Goals Yes      OT LONG TERM GOAL #6   Title Pt and dtr will complete FOTO outcome scale    Status On-going   will be completed at d/c     OT LONG TERM GOAL #7   Title Pt will participate in at least 1 home management task per day with no more than min A    Baseline currently not conistently assisting her dtr.    Time 5    Status On-going      OT LONG TERM GOAL #8   Title Pt will use her LUE as a stabilizer/ gross A 10% of the time for ADLs/ IADLs.    Baseline barely using LUE at all    Time 5    Period Weeks    Status New      OT LONG TERM GOAL  #9   TITLE Pt wil demonstrate ability to grasp/ relase a cup with no more than min A    Baseline mod facillitation/ assist    Time 5    Period Weeks    Status New                 Plan - 07/04/20 1032    Clinical Impression Statement Pt is progressing slowly towards goals however she does report leaing her room more and exercising more.    OT Occupational Profile and History Detailed Assessment- Review of Records and additional review of physical, cognitive, psychosocial history related to current functional performance    Occupational performance deficits (Please refer to evaluation for details): ADL's;IADL's;Work;Leisure;Social Participation    Body Structure / Function / Physical Skills ADL;Balance;Continence;Endurance;GMC;IADL;Mobility;Sensation;ROM;Pain;Strength;Tone;UE functional use    Rehab Potential Good    Clinical Decision Making  Multiple treatment options, significant modification of task necessary    Comorbidities Affecting Occupational Performance: Presence of comorbidities impacting occupational performance    Comorbidities impacting occupational performance description: see above    Modification or Assistance to Complete  Evaluation  Max significant modification of tasks or assist is necessary to complete    OT Frequency 2x / week    OT Duration --   5 weeks   OT Treatment/Interventions Self-care/ADL training;Aquatic Therapy;Electrical Stimulation;Ultrasound;Moist Heat;Therapeutic exercise;Neuromuscular education;Manual Therapy;Functional Mobility Training;DME and/or AE instruction;Passive range of motion;Patient/family education;Cognitive remediation/compensation;Therapeutic activities;Splinting;Balance training    Plan continue to work towards updated goals    Consulted and Agree with Plan of Care Patient;Family member/caregiver    Family Member Consulted dtr           Patient will benefit from skilled therapeutic intervention in order to improve the following deficits and impairments:   Body Structure / Function / Physical Skills: ADL, Balance, Continence, Endurance, GMC, IADL, Mobility, Sensation, ROM, Pain, Strength, Tone, UE functional use       Visit Diagnosis: Muscle weakness (generalized)  Other symptoms and signs involving the nervous system  Spastic hemiplegia 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 Cerebrovascular accident (CVA) due to thrombosis of precerebral artery ([place]) 11/30/2019  . 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 Spastic hemiparesis affecting nondominant side ([place]) 11/30/2019  . Chronic bilateral low back pain with sciatica 11/30/2019  . Metatarsal fracture 09/22/2019  . Depression 09/22/2019  . Asthma 09/22/2019  . Palliative care by specialist   . Goals of care, counseling/discussion   . Encephalopathy   . Acute CVA (cerebrovascular accident) ([place]) 09/14/2019  . Acute arterial ischemic stroke, multifocal, anterior circulation, right  ([place]) 09/14/2019  . Severe comorbid illness   . AMS (altered mental status) 09/13/2019  . Controlled diabetes mellitus type 2 with complications ([place]) 80/22/3361  . Hyperlipidemia 09/02/2017  . Hypertension 09/02/2017  . Chest pain 09/02/2017    Christina Evans 07/04/2020, 10:33 AM  [facility] [address] [place], [place], [address] Phone: [phone]   Fax:  [phone]  Name: Christina Evans MRN: [id number] Date of Birth: Jun 28, 1961

## 2020-07-08 ENCOUNTER — Other Ambulatory Visit: Payer: Self-pay

## 2020-07-08 ENCOUNTER — Ambulatory Visit: Payer: Medicare Other | Admitting: Physical Therapy

## 2020-07-08 DIAGNOSIS — R293 Abnormal posture: Secondary | ICD-10-CM

## 2020-07-08 DIAGNOSIS — R29818 Other symptoms and signs involving the nervous system: Secondary | ICD-10-CM

## 2020-07-08 DIAGNOSIS — M6281 Muscle weakness (generalized): Secondary | ICD-10-CM

## 2020-07-08 DIAGNOSIS — R2681 Unsteadiness on feet: Secondary | ICD-10-CM

## 2020-07-08 DIAGNOSIS — R2689 Other abnormalities of gait and mobility: Secondary | ICD-10-CM

## 2020-07-08 NOTE — Therapy (Signed)
[facility] [address] [place], [place], [address] Phone: [phone]   Fax:  [phone]  Physical Therapy Treatment  Patient Details  Name: Christina Evans MRN: [id number] Date of Birth: [birth year] Referring Provider (PT): Micki Riley, MD   Encounter Date: 07/08/2020   PT End of Session - 07/08/20 0932    Visit Number 31    Number of Visits 38    Date for PT Re-Evaluation 09/02/20   written for 4 week POC   Authorization Type [employer]    Progress Note Due on Visit 39   did progress note on visit 19   PT Start Time 0846    PT Stop Time 0928    PT Time Calculation (min) 42 min    Equipment Utilized During Treatment Gait belt;Other (comment)   shoe build up on left side   Activity Tolerance Patient tolerated treatment well    Behavior During Therapy WFL for tasks assessed/performed           Past Medical History:  Diagnosis Date   Asthma    CVA (cerebral vascular accident) (HCC)    Diabetes mellitus without complication (HCC)     Past Surgical History:  Procedure Laterality Date   IR ANGIO INTRA EXTRACRAN SEL COM CAROTID INNOMINATE BILAT MOD SED  09/15/2019   IR ANGIO VERTEBRAL SEL VERTEBRAL UNI L MOD SED  09/15/2019    There were no vitals filed for this visit.   Subjective Assessment - 07/08/20 0849    Subjective Been doing more walking at home with the cane. No falls.    Pertinent History PMH significant for large right MCA CVA, HTN, T2DM, HLD, hx of Left foot fifth metatarsal fracture with osteopenia, asthma.    Limitations Standing;Walking    How long can you stand comfortably? 1 minute (when daughter is changing her when she is wearing diapers).    How long can you walk comfortably? a few feet.    Diagnostic tests MRI brain with moderate sized acute to early subacute left ACA territory infarct andlarge chronic right MCA territory infarct.    Patient Stated Goals wants to get  better, wants to be independent.    Currently in Pain? No/denies                             [facility] Adult PT Treatment/Exercise - 07/08/20 0912      Transfers   Transfers Sit to Stand;Stand to Sit;Stand Pivot Transfers    Sit to Stand 4: Min guard    Sit to Stand Details Verbal cues for technique    Sit to Stand Details (indicate cue type and reason) with using RUE to push off from mat table    Stand to Sit 4: Min guard;4: Min assist    Stand to Sit Details cues to attend to LUE after gait training with RW to remove hand from L orthosis, min A today for pt to maintain weight shift over to LLE prior to sitting without UE support and for eccentric control    Comments x5 reps with 2" block under RLE for incr weight bearing/weight shifting through LLE, once in standing worked on weight shifting and holding over LLE with no UE support prior to sitting down - tactile, verbal and manual cues for proper weight shift and min A to maintain weight shift when sitting       Ambulation/Gait   Ambulation/Gait Yes  Ambulation/Gait Assistance 4: Min assist;5: Supervision    Ambulation/Gait Assistance Details 9 mm shoe lift and blue shoe cover added to LLE throughout gait for improved foot clearance and alignment, pt initially needing assist from therapist for L hand to remain in L hand orthosis, improved with incr distance and weight bearing through LUE. assist provided from therapist for incr L anterior pelvic rotation and weight shifting towards LLE during stance. attempted use of L dance sock (without shoe lift) at end of session with 3 prong SPC to assist with gait at home, however pt reporting no difference and no notable difference today, will assess again at next session    Ambulation Distance (Feet) 230 Feet   70 x1' with SPC with 3 prong tip   Assistive device Rolling walker   L hand orthosis   Gait Pattern Step-through pattern;Decreased step length - left;Decreased hip/knee flexion  - left;Decreased weight shift to left;Trunk rotated posteriorly on left;Decreased stance time - left    Ambulation Surface Level;Indoor      Neuro Re-ed    Neuro Re-ed Details  weight shifting towrds LLE and tapping 2" block x15 reps, min A for balance and weight shift, pt initially hesistant with no UE support, but improved with incr reps                  PT Education - 07/08/20 0931    Education Details making an appt with [employer] for leather toe cap and 9 mm lift for L shoe for gait for improved alignment and foot clearance    Person(s) Educated Patient;Child(ren)    Methods Explanation;Handout    Comprehension Verbalized understanding            PT Short Term Goals - 07/04/20 2015      PT SHORT TERM GOAL #1   Title ALL STGS = LTGS             PT Long Term Goals - 07/04/20 0941      PT LONG TERM GOAL #1   Title Pt will increase gait speed from  >0.23m/s for improved gait safety and mobility. ALL LTGS DUE 08/22/20    Baseline 0.58m/s on 06/04/20, 37.12 sec with RW = .27 m/s with RW, 39.82 seconds = . 25 m/s with SPC with 3 prong tip    Time 7   due to pt being on hold   Period Weeks    Status Deferred    Target Date 08/22/20      PT LONG TERM GOAL #2   Title Pt will tolerate static standing for at least 5 minutes with no UE support with supervision in order to improve tolerance for ADLs.    Baseline able to tolerate dynamic standing activities for 5 minutes for stepping with single to no UE support with min A    Time 7    Period Weeks    Status On-going      PT LONG TERM GOAL #3   Title Pt will ambulate at least 230' over level indoor/unlevel outdoor surfaces with LRAD with L AFO with min guard in order to improve functional mobility.    Baseline ambulated 230' with SPC with 3 prong tip - able to ambulate with min guard, but therapist providing min A for weight shift, did not test outdoor surfaces today    Time 7    Period Weeks    Status On-going      PT  LONG TERM GOAL #4   Title  Pt and pt's daughter will be independent with final HEP for weight bearing/lower extremity stretching/strengthening.    Baseline --    Time 7    Period Weeks    Status On-going      PT LONG TERM GOAL #5   Title --    Baseline --    Time --    Period --    Status --      PT LONG TERM GOAL #6   Title --    Baseline --    Time --    Period --    Status --                 Plan - 07/08/20 1156    Clinical Impression Statement Pt's daughter present for session today - provided information to pt's daughter to make appt at [facility] for pt to have L toe cap and 49mm shoe lift added due to improvements with gait mechanics. Remainder of session focused on gait training with RW/3 prong tip cane, and standing balance/weight shifting with no UE support. Pt with decr spasticity/tone in LUE at end of session after weight bearing activities and gait with RW. Continued to encourage incr ambulation at home with daughter. Will continue to progress towards LTGs.    Personal Factors and Comorbidities Comorbidity 3+;Past/Current Experience;Time since onset of injury/illness/exacerbation;Behavior Pattern    Comorbidities PMH significant for large right MCA CVA, HTN, T2DM, HLD, hx of Left foot fifth metatarsal fracture with osteopenia, asthma.    Examination-Activity Limitations Stand;Locomotion Level;Transfers;Toileting;Continence;Dressing    Examination-Participation Restrictions Community Activity    Stability/Clinical Decision Making Evolving/Moderate complexity    Rehab Potential Good    PT Frequency 2x / week    PT Duration 4 weeks    PT Treatment/Interventions ADLs/Self Care Home Management;Aquatic Therapy;Electrical Stimulation;DME Instruction;Gait training;Functional mobility training;Neuromuscular re-education;Balance training;Therapeutic exercise;Therapeutic activities;Patient/family education;Orthotic Fit/Training;Passive range of motion;Energy conservation    PT  Next Visit Plan did they get info about getting toe cap and shoe lift?  gait training. continue with stepping activities with RLE(with weight shifting towards LLE), gait training with no AD and with SPC with quad tip - with focus on facilitation for L weight shift and hip extension during terminal stance.    Consulted and Agree with Plan of Care Patient;Family member/caregiver           Patient will benefit from skilled therapeutic intervention in order to improve the following deficits and impairments:  Abnormal gait, Decreased activity tolerance, Decreased coordination, Decreased balance, Decreased cognition, Decreased endurance, Decreased knowledge of use of DME, Decreased range of motion, Decreased strength, Difficulty walking, Impaired tone, Impaired UE functional use, Postural dysfunction, Impaired sensation  Visit Diagnosis: Other symptoms and signs involving the nervous system  Muscle weakness (generalized)  Other abnormalities of gait and mobility  Unsteadiness on feet  Abnormal posture     Problem List Patient Active Problem List   Diagnosis Date Noted   Cerebrovascular accident (CVA) due to thrombosis of precerebral artery (HCC) 11/30/2019   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 11/30/2019   Spastic hemiparesis affecting nondominant side (HCC) 11/30/2019   Chronic bilateral low back pain with sciatica 11/30/2019   Metatarsal fracture 09/22/2019   Depression 09/22/2019   Asthma 09/22/2019   Palliative care by specialist    Goals of care, counseling/discussion    Encephalopathy    Acute CVA (cerebrovascular accident) (HCC) 09/14/2019   Acute arterial ischemic stroke, multifocal, anterior circulation, right (HCC) 09/14/2019  Severe comorbid illness    AMS (altered mental status) 09/13/2019   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   Hyperlipidemia 09/02/2017   Hypertension  09/02/2017   Chest pain 09/02/2017    Drake Leachhloe N Berdella Bacot, PT, DPT  07/08/2020, 12:03 PM  [facility] [address] [place], [place], [address] Phone: [phone]   Fax:  [phone]  Name: Christina Evans MRN: [id number] Date of Birth: [birth year]

## 2020-07-10 ENCOUNTER — Ambulatory Visit: Payer: Medicare Other | Admitting: Neurology

## 2020-07-11 ENCOUNTER — Ambulatory Visit: Payer: Medicare Other | Admitting: Physical Therapy

## 2020-07-12 ENCOUNTER — Ambulatory Visit: Payer: Medicare Other

## 2020-07-15 MED FILL — tiZANidine HCL 4 MG TABS: 4 | 30 days supply | Qty: 30 | Fill #1

## 2020-07-17 ENCOUNTER — Encounter: Payer: Self-pay | Admitting: Occupational Therapy

## 2020-07-17 ENCOUNTER — Ambulatory Visit: Payer: Medicare Other | Admitting: Physical Therapy

## 2020-07-17 ENCOUNTER — Ambulatory Visit: Payer: Medicare Other | Admitting: Occupational Therapy

## 2020-07-17 ENCOUNTER — Other Ambulatory Visit: Payer: Self-pay

## 2020-07-17 DIAGNOSIS — M25622 Stiffness of left elbow, not elsewhere classified: Secondary | ICD-10-CM

## 2020-07-17 DIAGNOSIS — R2681 Unsteadiness on feet: Secondary | ICD-10-CM

## 2020-07-17 DIAGNOSIS — I69318 Other symptoms and signs involving cognitive functions following cerebral infarction: Secondary | ICD-10-CM

## 2020-07-17 DIAGNOSIS — R293 Abnormal posture: Secondary | ICD-10-CM

## 2020-07-17 DIAGNOSIS — R29818 Other symptoms and signs involving the nervous system: Secondary | ICD-10-CM

## 2020-07-17 DIAGNOSIS — R2689 Other abnormalities of gait and mobility: Secondary | ICD-10-CM

## 2020-07-17 DIAGNOSIS — M6281 Muscle weakness (generalized): Secondary | ICD-10-CM | POA: Diagnosis not present

## 2020-07-17 DIAGNOSIS — I69354 Hemiplegia and hemiparesis following cerebral infarction affecting left non-dominant side: Secondary | ICD-10-CM

## 2020-07-17 NOTE — Therapy (Signed)
[facility] [address] [place], [place], [address] Phone: [phone]   Fax:  [phone]  Physical Therapy Treatment  Patient Details  Name: Christina Evans MRN: [id number] Date of Birth: 1960/12/13 Referring Provider (PT): Micki Riley, MD   Encounter Date: 07/17/2020   PT End of Session - 07/17/20 1016    Visit Number 32    Number of Visits 38    Date for PT Re-Evaluation 09/02/20   written for 4 week POC   Authorization Type [employer] Medicare    Progress Note Due on Visit 39   did progress note on visit 19   PT Start Time 0847    PT Stop Time 0930    PT Time Calculation (min) 43 min    Equipment Utilized During Treatment Gait belt    Activity Tolerance Patient tolerated treatment well    Behavior During Therapy WFL for tasks assessed/performed           Past Medical History:  Diagnosis Date   Asthma    CVA (cerebral vascular accident) (HCC)    Diabetes mellitus without complication (HCC)     Past Surgical History:  Procedure Laterality Date   IR ANGIO INTRA EXTRACRAN SEL COM CAROTID INNOMINATE BILAT MOD SED  09/15/2019   IR ANGIO VERTEBRAL SEL VERTEBRAL UNI L MOD SED  09/15/2019    There were no vitals filed for this visit.   Subjective Assessment - 07/17/20 0853    Subjective Had a busy weekend celebrating her daughter's birthday. Has been working on doing more walking at home.    Pertinent History PMH significant for large right MCA CVA, HTN, T2DM, HLD, hx of Left foot fifth metatarsal fracture with osteopenia, asthma.    Limitations Standing;Walking    How long can you stand comfortably? 1 minute (when daughter is changing her when she is wearing diapers).    How long can you walk comfortably? a few feet.    Diagnostic tests MRI brain with moderate sized acute to early subacute left ACA territory infarct andlarge chronic right MCA territory infarct.    Patient Stated Goals wants to get  better, wants to be independent.    Currently in Pain? No/denies                             [facility] Adult PT Treatment/Exercise - 07/17/20 0908      Transfers   Transfers Sit to Stand;Stand to Sit;Stand Pivot Transfers    Sit to Stand 5: Supervision;With upper extremity assist;From bed    Sit to Stand Details Verbal cues for technique    Sit to Stand Details (indicate cue type and reason) using RUE to push up from mat and a couple reps of pt able to stand with no UE support throughout session    Stand to Sit 5: Supervision;To bed;Without upper extremity assist    Stand to Sit Details throughout session after activities standing on level ground and on blue foam, cues for slowed and controlled descent when sitting with no UE support    Comments cues to attend to LUE to remove from hand orthosis prior to sitting      Ambulation/Gait   Ambulation/Gait Yes    Ambulation/Gait Assistance 4: Min assist    Ambulation/Gait Assistance Details therapist assisting with weight shift towards L, tactile cues to relax R shoulder, and assist with anterior pelvic rotation    Ambulation Distance (Feet)  100 Feet   x1, 60' x 1   Assistive device Rolling walker   with L hand orthosis   Gait Pattern Step-through pattern;Decreased step length - left;Decreased hip/knee flexion - left;Decreased weight shift to left;Trunk rotated posteriorly on left;Decreased stance time - left    Ambulation Surface Level;Indoor      Neuro Re-ed    Neuro Re-ed Details  shifting weight to LLE and tapping with RLE to 4" step 2 x 10 reps - beginning with single UE support and progressing to none with min A from therapist, then x6 reps with stepping RLE onto 4" step with single UE support and then letting go of chair for a couple seconds for incr SLS on LLE, needing min A and tactile/verbal cues for L quad activation     Exercises   Exercises Other Exercises    Other Exercises  L hip flexor stretch off the edge of the  mat 3 x 60 seconds, with therapist assisting with stretch               Balance Exercises - 07/17/20 0001      Balance Exercises: Standing   Standing Eyes Opened Wide (BOA);Foam/compliant surface;Limitations    Standing Eyes Opened Limitations standing on blue foam 2 x 5 reps head turns, 2 x 5 reps head nods - verbal and tactile cues for L quad activation and min guard for balance    Other Standing Exercises standing on blue foam: single UE support, 2 x 10 reps lateral stepping off foam and back on with RLE for closed chain hip ABD strengthening and weight bearing/SLS through LLE, then performing x10 reps stepping forwards and back onto foam with RLE               PT Short Term Goals - 07/04/20 2015      PT SHORT TERM GOAL #1   Title ALL STGS = LTGS             PT Long Term Goals - 07/04/20 0941      PT LONG TERM GOAL #1   Title Pt will increase gait speed from  >0.7233m/s for improved gait safety and mobility. ALL LTGS DUE 08/22/20    Baseline 0.6283m/s on 06/04/20, 37.12 sec with RW = .27 m/s with RW, 39.82 seconds = . 25 m/s with SPC with 3 prong tip    Time 7   due to pt being on hold   Period Weeks    Status Deferred    Target Date 08/22/20      PT LONG TERM GOAL #2   Title Pt will tolerate static standing for at least 5 minutes with no UE support with supervision in order to improve tolerance for ADLs.    Baseline able to tolerate dynamic standing activities for 5 minutes for stepping with single to no UE support with min A    Time 7    Period Weeks    Status On-going      PT LONG TERM GOAL #3   Title Pt will ambulate at least 230' over level indoor/unlevel outdoor surfaces with LRAD with L AFO with min guard in order to improve functional mobility.    Baseline ambulated 230' with SPC with 3 prong tip - able to ambulate with min guard, but therapist providing min A for weight shift, did not test outdoor surfaces today    Time 7    Period Weeks    Status On-going  PT LONG TERM GOAL #4   Title Pt and pt's daughter will be independent with final HEP for weight bearing/lower extremity stretching/strengthening.    Baseline --    Time 7    Period Weeks    Status On-going      PT LONG TERM GOAL #5   Title --    Baseline --    Time --    Period --    Status --      PT LONG TERM GOAL #6   Title --    Baseline --    Time --    Period --    Status --                 Plan - 07/17/20 1018    Clinical Impression Statement Pt ambulated into session with RW. Focus of today's session was hip flexor stretching on LLE, gait training with RW, and NMR for incr weight shifting towards LLE/balance with decr UE support. Pt able to demo improved balance on LLE today, able to perform step taps with RLE to 4" step without use of UE support with incr reps. Will continue to progress towards LTGs.    Personal Factors and Comorbidities Comorbidity 3+;Past/Current Experience;Time since onset of injury/illness/exacerbation;Behavior Pattern    Comorbidities PMH significant for large right MCA CVA, HTN, T2DM, HLD, hx of Left foot fifth metatarsal fracture with osteopenia, asthma.    Examination-Activity Limitations Stand;Locomotion Level;Transfers;Toileting;Continence;Dressing    Examination-Participation Restrictions Community Activity    Stability/Clinical Decision Making Evolving/Moderate complexity    Rehab Potential Good    PT Frequency 2x / week    PT Duration 4 weeks    PT Treatment/Interventions ADLs/Self Care Home Management;Aquatic Therapy;Electrical Stimulation;DME Instruction;Gait training;Functional mobility training;Neuromuscular re-education;Balance training;Therapeutic exercise;Therapeutic activities;Patient/family education;Orthotic Fit/Training;Passive range of motion;Energy conservation    PT Next Visit Plan did they get info about getting toe cap and shoe lift?  gait training. continue with stepping activities with RLE(with weight shifting  towards LLE), gait training with SPC with quad tip - with focus on facilitation for L weight shift and hip extension during terminal stance.    Consulted and Agree with Plan of Care Patient;Family member/caregiver           Patient will benefit from skilled therapeutic intervention in order to improve the following deficits and impairments:  Abnormal gait, Decreased activity tolerance, Decreased coordination, Decreased balance, Decreased cognition, Decreased endurance, Decreased knowledge of use of DME, Decreased range of motion, Decreased strength, Difficulty walking, Impaired tone, Impaired UE functional use, Postural dysfunction, Impaired sensation  Visit Diagnosis: Other symptoms and signs involving the nervous system  Muscle weakness (generalized)  Other abnormalities of gait and mobility  Unsteadiness on feet  Abnormal posture     Problem List Patient Active Problem List   Diagnosis Date Noted   Cerebrovascular accident (CVA) due to thrombosis of precerebral artery (HCC) 11/30/2019   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 11/30/2019   Spastic hemiparesis affecting nondominant side (HCC) 11/30/2019   Chronic bilateral low back pain with sciatica 11/30/2019   Metatarsal fracture 09/22/2019   Depression 09/22/2019   Asthma 09/22/2019   Palliative care by specialist    Goals of care, counseling/discussion    Encephalopathy    Acute CVA (cerebrovascular accident) (HCC) 09/14/2019   Acute arterial ischemic stroke, multifocal, anterior circulation, right (HCC) 09/14/2019   Severe comorbid illness    AMS (altered mental status) 09/13/2019   Controlled diabetes mellitus type 2  with complications (HCC) 09/02/2017   Hyperlipidemia 09/02/2017   Hypertension 09/02/2017   Chest pain 09/02/2017    Drake Leach, PT, DPT  07/17/2020, 11:28 AM  [facility] [address] [place], [place], [address] Phone: [phone]   Fax:  [phone]  Name: Lovelyn Sheeran MRN: [id number] Date of Birth: 10/01/1961

## 2020-07-17 NOTE — Therapy (Signed)
[facility] [address] [place], , [address] Phone: [phone]   Fax:  [phone]  Occupational Therapy Treatment  Patient Details  Name: Christina Evans MRN: [id number] Date of Birth: 07/05/1961 Referring Provider (OT): Dr. Sethi   Encounter Date: 07/17/2020   OT End of Session - 07/17/20 0935    Visit Number 29    Number of Visits 37    Date for OT Re-Evaluation 06/24/20    Authorization Type [employer] medicare and medicaid as secondary. Pt will need PN every 10th visit    Authorization Time Period renewed 06/27/20 for 5 week POC, cert for 90 days    Authorization - Visit Number 28   renewal completed on 27   Authorization - Number of Visits 37    Progress Note Due on Visit 37    OT Start Time 0935    OT Stop Time 1013    OT Time Calculation (min) 38 min    Activity Tolerance Patient tolerated treatment well    Behavior During Therapy WFL for tasks assessed/performed           Past Medical History:  Diagnosis Date  . Asthma   . CVA (cerebral vascular accident) (HCC)   . Diabetes mellitus without complication (HCC)     Past Surgical History:  Procedure Laterality Date  . IR ANGIO INTRA EXTRACRAN SEL COM CAROTID INNOMINATE BILAT MOD SED  09/15/2019  . IR ANGIO VERTEBRAL SEL VERTEBRAL UNI L MOD SED  09/15/2019    There were no vitals filed for this visit.   Subjective Assessment - 07/17/20 0935    Subjective  denies pain, everything's the same    Currently in Pain? No/denies                   Treatment: Seated self ROM stretch for the floor followed by supine joint and scapular mobs to left shoulder followed by self ROM shoulder flexion with therapist facilitating scapula. Sidelying on left side watching LUE to perform elbow flexion/ extension and supination/ pronation AA/ROM seated weightbearing through left elbow with body on arm movements, mod facilitation.  Seated weightbearing  through tilted stool for AA/ROM low range shoulder flexion, mod facilitation. Grasping vertical pole to perform circumduction, abduction and low range shoulder flexion, mod/ max facilitation/ v.c Functional grasp / release of 1 inch blocks with LUE, mod facilitation.               OT Short Term Goals - 06/27/20 0902      OT SHORT TERM GOAL #1   Title Pt and dtr will be mod  I with HEP to address ROM for RUE - 03/20/2020    Status Achieved      OT SHORT TERM GOAL #2   Title Pt and dtr will verbalize understanding for AE for cutting food on plate    Status Achieved      OT SHORT TERM GOAL #3   Title Pt will be mod I with washing face and set up for bushing teeth.    Status Achieved      OT SHORT TERM GOAL #4   Title Pt will be mod a for UB dressing    Status Achieved      OT SHORT TERM GOAL #5   Title Pt will be mod a for LB dressing    Status Achieved      OT SHORT TERM GOAL #6   Title Pt will require mod a   to hike pants    Status Achieved      OT SHORT TERM GOAL #7   Title Pt and dtr will verbalize understanding of toileting schedule to address incontinence    Status Achieved      OT SHORT TERM GOAL #8   Title Pt and dtr will be mod with splint wear and care (resting hand splint at night)    Status Achieved      OT SHORT TERM GOAL  #9   TITLE Pt will demonstrate ability to set table and wipe table (with dishes/silverware set on table) with supervision - 05/27/2020    Status Not Met   minguard in standing            OT Long Term Goals - 07/17/20 0939      OT LONG TERM GOAL #1   Title Pt and dtr will be mod I with upgraded home activities program to include focus ADL's, transitional movements and balance. -    Time 5    Period Weeks    Status On-going   needs reinforcement for carryover     OT LONG TERM GOAL #2   Title Pt will be supervision for toilet transfers    Status Achieved      OT LONG TERM GOAL #3   Title Pt will be no more than min a for  hiking pants and toilet hygiene    Status Achieved      OT LONG TERM GOAL #4   Title Pt will tolerate 110* of L shoulder flexion and 90* abduction during HEP for ease with ADL's with more than " a little " pain based on faces.    Status Achieved      OT LONG TERM GOAL #5   Title Pt will be supervision for simple snack prep    Status On-going   Pt reports perfroming beverage prep     OT LONG TERM GOAL #6   Title Pt and dtr will complete FOTO outcome scale    Status On-going   will be completed at d/c     OT LONG TERM GOAL #7   Title Pt will participate in at least 1 home management task per day with no more than min A    Baseline currently not conistently assisting her dtr.    Time 5    Status On-going      OT LONG TERM GOAL #8   Title Pt will use her LUE as a stabilizer/ gross A 10% of the time for ADLs/ IADLs.    Baseline barely using LUE at all    Time 5    Period Weeks    Status New      OT LONG TERM GOAL  #9   TITLE Pt wil demonstrate ability to grasp/ relase a cup with no more than min A    Baseline mod facillitation/ assist    Time 5    Period Weeks    Status New                 Plan - 07/17/20 1044    Clinical Impression Statement Pt is progressing slowly towards goals. Pt remains limited by attention deficits and spasticity.    OT Occupational Profile and History Detailed Assessment- Review of Records and additional review of physical, cognitive, psychosocial history related to current functional performance    Occupational performance deficits (Please refer to evaluation for details): ADL's;IADL's;Work;Leisure;Social Participation    Body Structure / Function /   Physical Skills ADL;Balance;Continence;Endurance;GMC;IADL;Mobility;Sensation;ROM;Pain;Strength;Tone;UE functional use    Rehab Potential Good    Clinical Decision Making Multiple treatment options, significant modification of task necessary    Comorbidities Affecting Occupational Performance:  Presence of comorbidities impacting occupational performance    Comorbidities impacting occupational performance description: see above    Modification or Assistance to Complete Evaluation  Max significant modification of tasks or assist is necessary to complete    OT Frequency 2x / week    OT Duration --   5 weeks   OT Treatment/Interventions Self-care/ADL training;Aquatic Therapy;Electrical Stimulation;Ultrasound;Moist Heat;Therapeutic exercise;Neuromuscular education;Manual Therapy;Functional Mobility Training;DME and/or AE instruction;Passive range of motion;Patient/family education;Cognitive remediation/compensation;Therapeutic activities;Splinting;Balance training    Plan consider simple IADL/ snack prep, NMR    Consulted and Agree with Plan of Care Patient;Family member/caregiver    Family Member Consulted dtr           Patient will benefit from skilled therapeutic intervention in order to improve the following deficits and impairments:   Body Structure / Function / Physical Skills: ADL, Balance, Continence, Endurance, GMC, IADL, Mobility, Sensation, ROM, Pain, Strength, Tone, UE functional use       Visit Diagnosis: Muscle weakness (generalized)  Other symptoms and signs involving the nervous system  Abnormal posture  Spastic hemiplegia of left nondominant side as late effect of cerebral infarction (HCC)  Stiffness of left upper arm joint  Other symptoms and signs involving cognitive functions following cerebral infarction  Unsteadiness on feet    Problem List Patient Active Problem List   Diagnosis Date Noted  . Cerebrovascular accident (CVA) due to thrombosis of precerebral artery ([place]) 11/30/2019  . 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 Spastic hemiparesis affecting nondominant side ([place]) 11/30/2019  . Chronic bilateral low back pain with sciatica 11/30/2019  . Metatarsal fracture 09/22/2019  . Depression  09/22/2019  . Asthma 09/22/2019  . Palliative care by specialist   . Goals of care, counseling/discussion   . Encephalopathy   . Acute CVA (cerebrovascular accident) ([place]) 09/14/2019  . Acute arterial ischemic stroke, multifocal, anterior circulation, right ([place]) 09/14/2019  . Severe comorbid illness   . AMS (altered mental status) 09/13/2019  . Controlled diabetes mellitus type 2 with complications ([place]) 58/07/9832  . Hyperlipidemia 09/02/2017  . Hypertension 09/02/2017  . Chest pain 09/02/2017    Arius Harnois 07/17/2020, 10:47 AM  [facility] [address] [place], [place], [address] Phone: [phone]   Fax:  [phone]  Name: Buena Boehm MRN: [id number] Date of Birth: [birth year]

## 2020-07-18 ENCOUNTER — Ambulatory Visit: Payer: Medicare Other | Admitting: Speech Pathology

## 2020-07-19 ENCOUNTER — Ambulatory Visit: Payer: Medicare Other | Admitting: Occupational Therapy

## 2020-07-19 ENCOUNTER — Encounter: Payer: Self-pay | Admitting: Occupational Therapy

## 2020-07-19 ENCOUNTER — Other Ambulatory Visit: Payer: Self-pay

## 2020-07-19 ENCOUNTER — Ambulatory Visit: Payer: Medicare Other

## 2020-07-19 DIAGNOSIS — M6281 Muscle weakness (generalized): Secondary | ICD-10-CM

## 2020-07-19 DIAGNOSIS — I69354 Hemiplegia and hemiparesis following cerebral infarction affecting left non-dominant side: Secondary | ICD-10-CM

## 2020-07-19 DIAGNOSIS — R2689 Other abnormalities of gait and mobility: Secondary | ICD-10-CM

## 2020-07-19 DIAGNOSIS — R2681 Unsteadiness on feet: Secondary | ICD-10-CM

## 2020-07-19 DIAGNOSIS — R293 Abnormal posture: Secondary | ICD-10-CM

## 2020-07-19 DIAGNOSIS — M25622 Stiffness of left elbow, not elsewhere classified: Secondary | ICD-10-CM

## 2020-07-19 DIAGNOSIS — R29818 Other symptoms and signs involving the nervous system: Secondary | ICD-10-CM

## 2020-07-19 NOTE — Therapy (Signed)
[facility] [address] [place], [place], [address] Phone: [phone]   Fax:  [phone]  Physical Therapy Treatment  Patient Details  Name: Christina Evans MRN: [id number] Date of Birth: [birth year] Referring Provider (PT): Micki Riley, MD   Encounter Date: 07/19/2020   PT End of Session - 07/19/20 0851    Visit Number 33    Number of Visits 38    Date for PT Re-Evaluation 09/02/20   written for 4 week POC   Authorization Type [employer] Medicare    Progress Note Due on Visit 39   did progress note on visit 19   PT Start Time 0849    PT Stop Time 0929    PT Time Calculation (min) 40 min    Equipment Utilized During Treatment Gait belt    Activity Tolerance Patient tolerated treatment well    Behavior During Therapy WFL for tasks assessed/performed           Past Medical History:  Diagnosis Date   Asthma    CVA (cerebral vascular accident) (HCC)    Diabetes mellitus without complication (HCC)     Past Surgical History:  Procedure Laterality Date   IR ANGIO INTRA EXTRACRAN SEL COM CAROTID INNOMINATE BILAT MOD SED  09/15/2019   IR ANGIO VERTEBRAL SEL VERTEBRAL UNI L MOD SED  09/15/2019    There were no vitals filed for this visit.   Subjective Assessment - 07/19/20 0851    Subjective Pt reports that she has been walking a little more at home. She walked out in her living room and spent some time out there. Daughter has been really busy so they have not been able to get over to [place] yet.    Pertinent History PMH significant for large right MCA CVA, HTN, T2DM, HLD, hx of Left foot fifth metatarsal fracture with osteopenia, asthma.    Limitations Standing;Walking    How long can you stand comfortably? 1 minute (when daughter is changing her when she is wearing diapers).    How long can you walk comfortably? a few feet.    Diagnostic tests MRI brain with moderate sized acute to early subacute left ACA  territory infarct andlarge chronic right MCA territory infarct.    Patient Stated Goals wants to get better, wants to be independent.    Currently in Pain? No/denies                             [facility] Adult PT Treatment/Exercise - 07/19/20 0853      Ambulation/Gait   Ambulation/Gait Yes    Ambulation/Gait Assistance 4: Min guard    Ambulation/Gait Assistance Details Pt assisting to weight shift more to the LLE as well as rotate left pelvis forward with steps.    Ambulation Distance (Feet) 230 Feet   230' with walker with left hand grip attachment CGA/min assi   Assistive device Straight cane;Rolling walker   with quad tip on cane and left hand grip attachment walker   Gait Pattern Step-through pattern;Decreased stance time - left;Decreased weight shift to left;Decreased step length - left;Trunk rotated posteriorly on left    Ambulation Surface Level;Indoor      Neuro Re-ed    Neuro Re-ed Details  In // bars: standing on rockerboard positioned lateral trying to maintain level x 20 sec x 2 then seated rest break. Then repeated with mirror in front for visual cues, tactile cues at  left leg to shift over more x 30 sec with RUE support then no UE support 20 sec x 2. Then performed rocking side to side x 10 with RUE support. Pt reports left leg fatigues with trying to maintain level. Tapping 2x4 black beam with RLE x 10 to facilitate left weight shift with RUE support then repeated with only fingertip support x 10.  CGA with all activities for safety.                    PT Short Term Goals - 07/04/20 2015      PT SHORT TERM GOAL #1   Title ALL STGS = LTGS             PT Long Term Goals - 07/04/20 0941      PT LONG TERM GOAL #1   Title Pt will increase gait speed from  >0.31m/s for improved gait safety and mobility. ALL LTGS DUE 08/22/20    Baseline 0.74m/s on 06/04/20, 37.12 sec with RW = .27 m/s with RW, 39.82 seconds = . 25 m/s with SPC with 3 prong tip     Time 7   due to pt being on hold   Period Weeks    Status Deferred    Target Date 08/22/20      PT LONG TERM GOAL #2   Title Pt will tolerate static standing for at least 5 minutes with no UE support with supervision in order to improve tolerance for ADLs.    Baseline able to tolerate dynamic standing activities for 5 minutes for stepping with single to no UE support with min A    Time 7    Period Weeks    Status On-going      PT LONG TERM GOAL #3   Title Pt will ambulate at least 230' over level indoor/unlevel outdoor surfaces with LRAD with L AFO with min guard in order to improve functional mobility.    Baseline ambulated 230' with SPC with 3 prong tip - able to ambulate with min guard, but therapist providing min A for weight shift, did not test outdoor surfaces today    Time 7    Period Weeks    Status On-going      PT LONG TERM GOAL #4   Title Pt and pt's daughter will be independent with final HEP for weight bearing/lower extremity stretching/strengthening.    Baseline --    Time 7    Period Weeks    Status On-going      PT LONG TERM GOAL #5   Title --    Baseline --    Time --    Period --    Status --      PT LONG TERM GOAL #6   Title --    Baseline --    Time --    Period --    Status --                 Plan - 07/19/20 1120    Clinical Impression Statement PT continued to focus on increasing LLE weight shift. Pt's left leg fatigued on rockerboard when trying to maintain level. Visual cues seemed to help pt get more weight shift.    Personal Factors and Comorbidities Comorbidity 3+;Past/Current Experience;Time since onset of injury/illness/exacerbation;Behavior Pattern    Comorbidities PMH significant for large right MCA CVA, HTN, T2DM, HLD, hx of Left foot fifth metatarsal fracture with osteopenia, asthma.    Examination-Activity Limitations Stand;Locomotion  Level;Transfers;Toileting;Continence;Dressing    Examination-Participation Restrictions  Community Activity    Stability/Clinical Decision Making Evolving/Moderate complexity    Rehab Potential Good    PT Frequency 2x / week    PT Duration 4 weeks    PT Treatment/Interventions ADLs/Self Care Home Management;Aquatic Therapy;Electrical Stimulation;DME Instruction;Gait training;Functional mobility training;Neuromuscular re-education;Balance training;Therapeutic exercise;Therapeutic activities;Patient/family education;Orthotic Fit/Training;Passive range of motion;Energy conservation    PT Next Visit Plan did they get info about getting toe cap and shoe lift? Daughter was going to take mom to [employer] after session.  gait training. continue with stepping activities with RLE(with weight shifting towards LLE), gait training with SPC with quad tip - with focus on facilitation for L weight shift and hip extension during terminal stance.    Consulted and Agree with Plan of Care Patient;Family member/caregiver           Patient will benefit from skilled therapeutic intervention in order to improve the following deficits and impairments:  Abnormal gait, Decreased activity tolerance, Decreased coordination, Decreased balance, Decreased cognition, Decreased endurance, Decreased knowledge of use of DME, Decreased range of motion, Decreased strength, Difficulty walking, Impaired tone, Impaired UE functional use, Postural dysfunction, Impaired sensation  Visit Diagnosis: Other abnormalities of gait and mobility  Muscle weakness (generalized)  Abnormal posture     Problem List Patient Active Problem List   Diagnosis Date Noted   Cerebrovascular accident (CVA) due to thrombosis of precerebral artery (HCC) 11/30/2019   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 11/30/2019   Spastic hemiparesis affecting nondominant side (HCC) 11/30/2019   Chronic bilateral low back pain with sciatica 11/30/2019   Metatarsal fracture 09/22/2019   Depression  09/22/2019   Asthma 09/22/2019   Palliative care by specialist    Goals of care, counseling/discussion    Encephalopathy    Acute CVA (cerebrovascular accident) (HCC) 09/14/2019   Acute arterial ischemic stroke, multifocal, anterior circulation, right (HCC) 09/14/2019   Severe comorbid illness    AMS (altered mental status) 09/13/2019   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   Hyperlipidemia 09/02/2017   Hypertension 09/02/2017   Chest pain 09/02/2017    Ronn Melena, PT, DPT, NCS 07/19/2020, 11:27 AM  [facility] [address] [place], [place], [address] Phone: [phone]   Fax:  [phone]  Name: Christina Evans MRN: [id number] Date of Birth: 1961/08/11

## 2020-07-19 NOTE — Therapy (Signed)
[facility] [address] [place], [place], [address] Phone: [phone]   Fax:  [phone]  Occupational Therapy Treatment  Patient Details  Name: Christina Evans MRN: [id number] Date of Birth: 08/12/1961 Referring Provider (OT): Dr. Leonie Man   Encounter Date: 07/19/2020   OT End of Session - 07/19/20 0757    Visit Number 30    Number of Visits 37    Date for OT Re-Evaluation 08/01/20    Authorization Type [employer] medicare and medicaid as secondary. Pt will need PN every 10th visit    Authorization Time Period renewed 06/27/20 for 5 week POC, cert for 90 days    Authorization - Visit Number 29    Authorization - Number of Visits 37    Progress Note Due on Visit 30    OT Start Time 0805    OT Stop Time 0846    OT Time Calculation (min) 41 min    Activity Tolerance Patient tolerated treatment well    Behavior During Therapy [facility] for tasks assessed/performed           Past Medical History:  Diagnosis Date  . Asthma   . CVA (cerebral vascular accident) ([place])   . Diabetes mellitus without complication [facility])     Past Surgical History:  Procedure Laterality Date  . IR ANGIO INTRA EXTRACRAN SEL COM CAROTID INNOMINATE BILAT MOD SED  09/15/2019  . IR ANGIO VERTEBRAL SEL VERTEBRAL UNI L MOD SED  09/15/2019    There were no vitals filed for this visit.   Subjective Assessment - 07/19/20 0809    Subjective  Pt denies any pain today.    Patient is accompanied by: Family member    Pertinent History Pt s/p recent R ACA CVA 09/13/19. 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 No/denies                 Treatment:  Patient seen for therapy session. Patient tolerated session well. OT performed scapular mobilizations to left upper  extremity and completed PROM for shoulder, elbow, forearm and hand. Pt engaged in weight bearing on LUE while reaching at varying planes for cylindrical pieces with RUE and placing to right side on mat for weight bearing to inhibit tone. Pt demonstrated light beverage prep in ADL kitchen with contact guard assistance for stability and verbal cues for sequencing and technique. Contact guard assistance for all ambulation this day with large base SPC.                 OT Short Term Goals - 06/27/20 0902      OT SHORT TERM GOAL #1   Title Pt and dtr will be mod  I with HEP to address ROM for RUE - 03/20/2020    Status Achieved      OT SHORT TERM GOAL #2   Title Pt and dtr will verbalize understanding for AE for cutting food on plate    Status Achieved      OT SHORT TERM GOAL #3   Title Pt will be mod I with washing face and set up for bushing teeth.    Status Achieved      OT SHORT TERM GOAL #4   Title Pt will be mod a for UB dressing    Status Achieved  OT SHORT TERM GOAL #5   Title Pt will be mod a for LB dressing    Status Achieved      OT SHORT TERM GOAL #6   Title Pt will require mod a to hike pants    Status Achieved      OT SHORT TERM GOAL #7   Title Pt and dtr will verbalize understanding of toileting schedule to address incontinence    Status Achieved      OT SHORT TERM GOAL #8   Title Pt and dtr will be mod with splint wear and care (resting hand splint at night)    Status Achieved      OT SHORT TERM GOAL  #9   TITLE Pt will demonstrate ability to set table and wipe table (with dishes/silverware set on table) with supervision - 05/27/2020    Status Not Met   minguard in standing            OT Long Term Goals - 07/19/20 1325      OT LONG TERM GOAL #1   Title Pt and dtr will be mod I with upgraded home activities program to include focus ADL's, transitional movements and balance. -    Time 5    Period Weeks    Status On-going   needs reinforcement  for carryover     OT LONG TERM GOAL #2   Title Pt will be supervision for toilet transfers    Status Achieved      OT LONG TERM GOAL #3   Title Pt will be no more than min a for hiking pants and toilet hygiene    Status Achieved      OT LONG TERM GOAL #4   Title Pt will tolerate 110* of L shoulder flexion and 90* abduction during HEP for ease with ADL's with more than " a little " pain based on faces.    Status Achieved      OT LONG TERM GOAL #5   Title Pt will be supervision for simple snack prep    Status On-going   Pt required CGA for stability and required min verbal cues for sequencing for beverage prep.     OT LONG TERM GOAL #6   Title Pt and dtr will complete FOTO outcome scale    Status On-going   will be completed at d/c     OT LONG TERM GOAL #7   Title Pt will participate in at least 1 home management task per day with no more than min A    Baseline currently not conistently assisting her dtr.    Time 5    Status On-going      OT LONG TERM GOAL #8   Title Pt will use her LUE as a stabilizer/ gross A 10% of the time for ADLs/ IADLs.    Baseline barely using LUE at all    Time 5    Period Weeks    Status New      OT LONG TERM GOAL  #9   TITLE Pt wil demonstrate ability to grasp/ relase a cup with no more than min A    Baseline mod facillitation/ assist    Time 5    Period Weeks    Status New                 Plan - 07/19/20 0848    Clinical Impression Statement Pt is progressing slowly towards goals. Spasticity remains a barrier to progress.  OT Occupational Profile and History Detailed Assessment- Review of Records and additional review of physical, cognitive, psychosocial history related to current functional performance    Occupational performance deficits (Please refer to evaluation for details): ADL's;IADL's;Work;Leisure;Social Participation    Body Structure / Function / Physical Skills  ADL;Balance;Continence;Endurance;GMC;IADL;Mobility;Sensation;ROM;Pain;Strength;Tone;UE functional use    Rehab Potential Good    Clinical Decision Making Multiple treatment options, significant modification of task necessary    Comorbidities Affecting Occupational Performance: Presence of comorbidities impacting occupational performance    Comorbidities impacting occupational performance description: see above    Modification or Assistance to Complete Evaluation  Max significant modification of tasks or assist is necessary to complete    OT Frequency 2x / week    OT Duration --   5 weeks   OT Treatment/Interventions Self-care/ADL training;Aquatic Therapy;Electrical Stimulation;Ultrasound;Moist Heat;Therapeutic exercise;Neuromuscular education;Manual Therapy;Functional Mobility Training;DME and/or AE instruction;Passive range of motion;Patient/family education;Cognitive remediation/compensation;Therapeutic activities;Splinting;Balance training    Plan update HEP and place on hold until post Botox    Consulted and Agree with Plan of Care Patient;Family member/caregiver    Family Member Consulted dtr           Patient will benefit from skilled therapeutic intervention in order to improve the following deficits and impairments:   Body Structure / Function / Physical Skills: ADL, Balance, Continence, Endurance, GMC, IADL, Mobility, Sensation, ROM, Pain, Strength, Tone, UE functional use       Visit Diagnosis: Muscle weakness (generalized)  Spastic hemiplegia of left nondominant side as late effect of cerebral infarction (HCC)  Unsteadiness on feet  Other abnormalities of gait and mobility  Stiffness of left upper arm joint  Other symptoms and signs involving the nervous system    Problem List Patient Active Problem List   Diagnosis Date Noted  . Cerebrovascular accident (CVA) due to thrombosis of precerebral artery ([place]) 11/30/2019  . 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 Spastic hemiparesis affecting nondominant side ([place]) 11/30/2019  . Chronic bilateral low back pain with sciatica 11/30/2019  . Metatarsal fracture 09/22/2019  . Depression 09/22/2019  . Asthma 09/22/2019  . Palliative care by specialist   . Goals of care, counseling/discussion   . Encephalopathy   . Acute CVA (cerebrovascular accident) ([place]) 09/14/2019  . Acute arterial ischemic stroke, multifocal, anterior circulation, right ([place]) 09/14/2019  . Severe comorbid illness   . AMS (altered mental status) 09/13/2019  . Controlled diabetes mellitus type 2 with complications ([place]) 40/98/1191  . Hyperlipidemia 09/02/2017  . Hypertension 09/02/2017  . Chest pain 09/02/2017    Waldo Laine, MOT, OTR/L Occupational Therapist   Zachery Conch 07/19/2020, 1:26 PM  [facility] [address] [place], [place], [address] Phone: [phone]   Fax:  [phone]  Name: Christina Evans MRN: [id number] Date of Birth: 03-18-1961

## 2020-07-22 ENCOUNTER — Ambulatory Visit: Payer: Medicare Other | Admitting: Physical Therapy

## 2020-07-22 ENCOUNTER — Ambulatory Visit: Payer: Medicare Other | Admitting: Occupational Therapy

## 2020-07-23 ENCOUNTER — Ambulatory Visit: Payer: Medicare Other | Admitting: Occupational Therapy

## 2020-07-23 MED FILL — ACCU-CHEK GUIDE STRP: 30 days supply | Qty: 100 | Fill #8

## 2020-07-25 ENCOUNTER — Other Ambulatory Visit (HOSPITAL_COMMUNITY): Payer: Self-pay | Admitting: Internal Medicine

## 2020-07-25 MED FILL — metFORMIN HCL 1000 MG TABS: 1000 | 90 days supply | Qty: 180 | Fill #0

## 2020-08-06 ENCOUNTER — Other Ambulatory Visit (HOSPITAL_COMMUNITY): Payer: Self-pay | Admitting: Internal Medicine

## 2020-08-07 MED FILL — CLOPIDOGREL 75 MG TABLET: 75 | 90 days supply | Qty: 90 | Fill #0

## 2020-08-08 ENCOUNTER — Encounter: Payer: Self-pay | Admitting: Physical Medicine & Rehabilitation

## 2020-08-08 ENCOUNTER — Other Ambulatory Visit: Payer: Self-pay

## 2020-08-08 ENCOUNTER — Encounter: Payer: Medicare Other | Attending: Physical Medicine & Rehabilitation | Admitting: Physical Medicine & Rehabilitation

## 2020-08-08 VITALS — BP 128/77 | HR 95 | Temp 98.4°F | Ht 61.0 in | Wt 164.0 lb

## 2020-08-08 DIAGNOSIS — G8110 Spastic hemiplegia affecting unspecified side: Secondary | ICD-10-CM | POA: Insufficient documentation

## 2020-08-08 DIAGNOSIS — Z79899 Other long term (current) drug therapy: Secondary | ICD-10-CM | POA: Diagnosis present

## 2020-08-08 DIAGNOSIS — I69319 Unspecified symptoms and signs involving cognitive functions following cerebral infarction: Secondary | ICD-10-CM | POA: Diagnosis present

## 2020-08-08 DIAGNOSIS — Z5181 Encounter for therapeutic drug level monitoring: Secondary | ICD-10-CM | POA: Diagnosis present

## 2020-08-08 DIAGNOSIS — R2242 Localized swelling, mass and lump, left lower limb: Secondary | ICD-10-CM | POA: Diagnosis present

## 2020-08-08 DIAGNOSIS — G8114 Spastic hemiplegia affecting left nondominant side: Secondary | ICD-10-CM | POA: Diagnosis not present

## 2020-08-08 NOTE — Progress Notes (Signed)
Dysport Injection for spasticity using needle EMG guidance  Dilution: 200 Units/ml Indication: Severe spasticity which interferes with ADL,mobility and/or  hygiene and is unresponsive to medication management and other conservative care Informed consent was obtained after describing risks and benefits of the procedure with the patient. This includes bleeding, bruising, infection, excessive weakness, or medication side effects. A REMS form is on file and signed. Needle:  needle electrode Number of units per muscle Dysport Left Trunk Pec 200 Upper ext Biceps 200 FCR 200 FDS 200 FDP 200 Lower ext PT 300 FDL 200 All injections were done after obtaining appropriate EMG activity and after negative drawback for blood. The patient tolerated the procedure well. Post procedure instructions were given. A followup appointment was made.

## 2020-08-08 NOTE — Patient Instructions (Signed)
You received a Dysport injection today. You may experience muscle pains and aches. He may apply ice 20 minutes every 2 hours as needed for the next 24-48 hours. He also noticed bleeding or bruising in the areas that were injected. May apply Band-Aid. If this bruising is extensive, please notify our office. If there is evidence of increasing redness that occurs 2-3 days after injection. Please call our office. This could be a sign of infection. It is very rare, however. You may experience some muscle weakness in the muscles and injected. This would likely start in about one week.  Left Trunk Pec 200 Upper ext Biceps 200 FCR 200 FDS 200 FDP 200 Lower ext PT 300 FDL 200

## 2020-08-08 NOTE — Addendum Note (Signed)
Addended by: Erick Colace on: 08/08/2020 01:16 PM   Modules accepted: Orders

## 2020-08-20 ENCOUNTER — Other Ambulatory Visit: Payer: Self-pay | Admitting: Physical Medicine & Rehabilitation

## 2020-08-20 MED FILL — tiZANidine HCL 4 MG TABS: 4 | 30 days supply | Qty: 30 | Fill #0

## 2020-08-21 ENCOUNTER — Ambulatory Visit: Payer: Medicare Other | Admitting: Occupational Therapy

## 2020-08-21 ENCOUNTER — Ambulatory Visit: Payer: Medicare Other | Attending: Neurology | Admitting: Physical Therapy

## 2020-08-21 ENCOUNTER — Encounter: Payer: Self-pay | Admitting: Occupational Therapy

## 2020-08-21 ENCOUNTER — Other Ambulatory Visit: Payer: Self-pay

## 2020-08-21 DIAGNOSIS — M79602 Pain in left arm: Secondary | ICD-10-CM

## 2020-08-21 DIAGNOSIS — M25622 Stiffness of left elbow, not elsewhere classified: Secondary | ICD-10-CM

## 2020-08-21 DIAGNOSIS — R2681 Unsteadiness on feet: Secondary | ICD-10-CM

## 2020-08-21 DIAGNOSIS — R2689 Other abnormalities of gait and mobility: Secondary | ICD-10-CM | POA: Diagnosis present

## 2020-08-21 DIAGNOSIS — M6281 Muscle weakness (generalized): Secondary | ICD-10-CM

## 2020-08-21 DIAGNOSIS — I69354 Hemiplegia and hemiparesis following cerebral infarction affecting left non-dominant side: Secondary | ICD-10-CM | POA: Diagnosis present

## 2020-08-21 MED FILL — ATORVASTATIN 80 MG TABLET: 80 | 90 days supply | Qty: 90 | Fill #0

## 2020-08-21 NOTE — Therapy (Signed)
[facility] [address] [place], [place], [address] Phone: [phone]   Fax:  [phone]  Occupational Therapy Treatment  Patient Details  Name: Christina Evans MRN: [id number] Date of Birth: [birth year] Referring Provider (OT): Dr. Leonie Man   Encounter Date: 08/21/2020   OT End of Session - 08/21/20 1329    Visit Number 31    Number of Visits 37    Date for OT Re-Evaluation 08/01/20    Authorization Type [employer] medicare and medicaid as secondary. Pt will need PN every 10th visit    Authorization Time Period renewed 06/27/20 for 5 week POC, cert for 90 days    Authorization - Visit Number 50    Authorization - Number of Visits 37    Progress Note Due on Visit 21    OT Start Time 1318    OT Stop Time 1400    OT Time Calculation (min) 42 min    Activity Tolerance Patient tolerated treatment well    Behavior During Therapy [facility] for tasks assessed/performed           Past Medical History:  Diagnosis Date  . Asthma   . CVA (cerebral vascular accident) ([place])   . Diabetes mellitus without complication [facility])     Past Surgical History:  Procedure Laterality Date  . IR ANGIO INTRA EXTRACRAN SEL COM CAROTID INNOMINATE BILAT MOD SED  09/15/2019  . IR ANGIO VERTEBRAL SEL VERTEBRAL UNI L MOD SED  09/15/2019    There were no vitals filed for this visit.   Subjective Assessment - 08/21/20 1324    Subjective  Pt denies any pain today. Pt reports being more active but not doing as much around the house. Pt reports needing to call 911 due to choking at a birthday dinner but they reported everything was ok. Pt reports no falls. Pt reports when she walks, she runs out of breath more often.    Patient is accompanied by: Family member    Pertinent History Pt s/p recent R ACA CVA 09/13/19. Pt with h/o of R MCA CVA in 2008 with resultant L spastic hemiplegia, DM, mild obesity, severe bilateral occlusive intracranial syndrome  suggestive of moyamoya syndrome, incont of B&B, depression, asthma    Currently in Pain? No/denies               OT Treatments/Exercises (OP) - 08/21/20 1455      Bed Mobility   Bed Mobility Rolling Left;Sit to Supine    Rolling Left Supervision/Verbal cueing    Sit to Supine Supervision/Verbal cueing      Transfers   Transfers Stand to Sit    Stand to Sit 5: Supervision      ADLs   Home Maintenance pt is assisting daughter with folding laundry and hanging up clothes.      Exercises   Exercises Shoulder;Elbow      Elbow Exercises   Elbow Extension AROM;Left;10 reps;Sidelying      Neurological Re-education Exercises   Shoulder Flexion Self ROM;Left;10 reps;Supine;Seated    Other Exercises 1 elbow flexion/extension in sidelying x 10 reps with manual stretch from OT with inhibition of tone and maximizing ROM.      Manual Therapy   Manual Therapy --                    OT Short Term Goals - 06/27/20 0902      OT SHORT TERM GOAL #1   Title Pt and dtr will  be mod  I with HEP to address ROM for RUE - 03/20/2020    Status Achieved      OT SHORT TERM GOAL #2   Title Pt and dtr will verbalize understanding for AE for cutting food on plate    Status Achieved      OT SHORT TERM GOAL #3   Title Pt will be mod I with washing face and set up for bushing teeth.    Status Achieved      OT SHORT TERM GOAL #4   Title Pt will be mod a for UB dressing    Status Achieved      OT SHORT TERM GOAL #5   Title Pt will be mod a for LB dressing    Status Achieved      OT SHORT TERM GOAL #6   Title Pt will require mod a to hike pants    Status Achieved      OT SHORT TERM GOAL #7   Title Pt and dtr will verbalize understanding of toileting schedule to address incontinence    Status Achieved      OT SHORT TERM GOAL #8   Title Pt and dtr will be mod with splint wear and care (resting hand splint at night)    Status Achieved      OT SHORT TERM GOAL  #9   TITLE Pt will  demonstrate ability to set table and wipe table (with dishes/silverware set on table) with supervision - 05/27/2020    Status Not Met   minguard in standing            OT Long Term Goals - 08/21/20 1332      OT LONG TERM GOAL #1   Title Pt and dtr will be mod I with upgraded home activities program to include focus ADL's, transitional movements and balance. -    Time 5    Period Weeks    Status On-going   needs reinforcement for carryover     OT LONG TERM GOAL #2   Title Pt will be supervision for toilet transfers    Status Achieved      OT LONG TERM GOAL #3   Title Pt will be no more than min a for hiking pants and toilet hygiene    Status Achieved      OT LONG TERM GOAL #4   Title Pt will tolerate 110* of L shoulder flexion and 90* abduction during HEP for ease with ADL's with more than " a little " pain based on faces.    Status Achieved      OT LONG TERM GOAL #5   Title Pt will be supervision for simple snack prep    Status Deferred   not appropriate at this time.     OT LONG TERM GOAL #6   Title Pt and dtr will complete FOTO outcome scale    Status On-going   will be completed at d/c     OT LONG TERM GOAL #7   Title Pt will participate in at least 1 home management task per day with no more than min A    Baseline currently not conistently assisting her dtr.    Time 5    Status On-going   helping with hanging and folding her clothes 08/21/20     OT LONG TERM GOAL #8   Title Pt will use her LUE as a stabilizer/ gross A 10% of the time for ADLs/ IADLs.  Baseline barely using LUE at all    Time 5    Period Weeks    Status On-going   encouraging patient to utilize LUE more.     OT LONG TERM GOAL  #9   TITLE Pt wil demonstrate ability to grasp/ relase a cup with no more than min A    Baseline mod facillitation/ assist    Time 5    Period Weeks    Status Deferred   not appropriate at this time                Plan - 08/21/20 1340    Clinical Impression  Statement Pt returns from being placed on hold until post botox treatment. Pt received botox 08/08/20 and renewal is being completed today to continue POC. Pt has met all STGs appropriate at this time. LTGs were assessed: pt has met 3/9 and continues to work on 4/9 to prepare for discharge and increased independence in the home. Two goals have been deferred at this time secondary to not being appropriate for patient's plan of care. Pt is progressing slowly towards goals. Spasticity remains a barrier to progress. Pt received botox 08/08/20 with limited change in functional use of LUE at this time.    OT Occupational Profile and History Detailed Assessment- Review of Records and additional review of physical, cognitive, psychosocial history related to current functional performance    Occupational performance deficits (Please refer to evaluation for details): ADL's;IADL's;Work;Leisure;Social Participation    Body Structure / Function / Physical Skills ADL;Balance;Continence;Endurance;GMC;IADL;Mobility;Sensation;ROM;Pain;Strength;Tone;UE functional use    Rehab Potential Good    Clinical Decision Making Multiple treatment options, significant modification of task necessary    Comorbidities Affecting Occupational Performance: Presence of comorbidities impacting occupational performance    Comorbidities impacting occupational performance description: see above    Modification or Assistance to Complete Evaluation  Max significant modification of tasks or assist is necessary to complete    OT Frequency 2x / week    OT Duration --   5 weeks   OT Treatment/Interventions Self-care/ADL training;Aquatic Therapy;Electrical Stimulation;Ultrasound;Moist Heat;Therapeutic exercise;Neuromuscular education;Manual Therapy;Functional Mobility Training;DME and/or AE instruction;Passive range of motion;Patient/family education;Cognitive remediation/compensation;Therapeutic activities;Splinting;Balance training    Plan review HEP  and NMR LUE    Consulted and Agree with Plan of Care Patient;Family member/caregiver    Family Member Consulted dtr           Patient will benefit from skilled therapeutic intervention in order to improve the following deficits and impairments:   Body Structure / Function / Physical Skills: ADL, Balance, Continence, Endurance, GMC, IADL, Mobility, Sensation, ROM, Pain, Strength, Tone, UE functional use       Visit Diagnosis: Muscle weakness (generalized) - Plan: Ot plan of care cert/re-cert  Spastic hemiplegia of left nondominant side as late effect of cerebral infarction [name]) - Plan: Ot plan of care cert/re-cert  Unsteadiness on feet - Plan: Ot plan of care cert/re-cert  Other abnormalities of gait and mobility - Plan: Ot plan of care cert/re-cert  Stiffness of left upper arm joint - Plan: Ot plan of care cert/re-cert  Pain in left arm - Plan: Ot plan of care cert/re-cert    Problem List Patient Active Problem List   Diagnosis Date Noted  . Cerebrovascular accident (CVA) due to thrombosis of precerebral artery ([place]) 11/30/2019  . 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 Spastic hemiparesis affecting nondominant side ([place]) 11/30/2019  . Chronic bilateral low back pain with sciatica  11/30/2019  . Metatarsal fracture 09/22/2019  . Depression 09/22/2019  . Asthma 09/22/2019  . Palliative care by specialist   . Goals of care, counseling/discussion   . Encephalopathy   . Acute CVA (cerebrovascular accident) ([place]) 09/14/2019  . Acute arterial ischemic stroke, multifocal, anterior circulation, right ([place]) 09/14/2019  . Severe comorbid illness   . AMS (altered mental status) 09/13/2019  . Controlled diabetes mellitus type 2 with complications ([place]) 96/64/6605  . Hyperlipidemia 09/02/2017  . Hypertension 09/02/2017  . Chest pain 09/02/2017    Zachery Conch MOT, OTR/L  08/21/2020, 3:12 PM  [facility] [address] [place], [place], [address] Phone: [phone]   Fax:  [phone]  Name: Christina Evans MRN: [id number] Date of Birth: 06-09-1961

## 2020-08-21 NOTE — Therapy (Signed)
[facility] [address] [place], [place], [address] Phone: [phone]   Fax:  [phone]  Physical Therapy Treatment/Re-Cert  Patient Details  Name: Christina Evans MRN: [id number] Date of Birth: 10/28/1961 Referring Provider (PT): Garvin Fila, MD   Encounter Date: 08/21/2020   PT End of Session - 08/21/20 2109    Visit Number 34    Number of Visits 41    Date for PT Re-Evaluation 10/20/20   written for 4 week POC   Authorization Type [employer] Medicare    Progress Note Due on Visit 44   did progress note on visit 74   PT Start Time 1401    PT Stop Time 1448    PT Time Calculation (min) 47 min    Equipment Utilized During Treatment Gait belt    Activity Tolerance Patient tolerated treatment well    Behavior During Therapy WFL for tasks assessed/performed           Past Medical History:  Diagnosis Date   Asthma    CVA (cerebral vascular accident) ([place])    Diabetes mellitus without complication ([place])     Past Surgical History:  Procedure Laterality Date   IR ANGIO INTRA EXTRACRAN SEL COM CAROTID INNOMINATE BILAT MOD SED  09/15/2019   IR ANGIO VERTEBRAL SEL VERTEBRAL UNI L MOD SED  09/15/2019    There were no vitals filed for this visit.   Subjective Assessment - 08/21/20 1406    Subjective Got a dysport shot a couple weeks ago to help with spasticity. Reports it is helping with her walking and her L arm is feeling looser. Got the new shoes  from [employer]- they are wider and feel better on the feet. Has not been wearing the brace when she walks (states that it hurts when she walks). Has been doing more things out and about while walking - mainly using the cane outside and inside the house (with daughter right there beside her). Has had trouble with the 2 stairs at her sister's due to her balance - can do them, but takes incr time.    Pertinent History PMH significant for large right MCA CVA, HTN,  T2DM, HLD, hx of Left foot fifth metatarsal fracture with osteopenia, asthma.    Limitations Standing;Walking    How long can you stand comfortably? 1 minute (when daughter is changing her when she is wearing diapers).    How long can you walk comfortably? a few feet.    Diagnostic tests MRI brain with moderate sized acute to early subacute left ACA territory infarct andlarge chronic right MCA territory infarct.    Patient Stated Goals wants to get better, wants to be independent. work more on balance and stairs    Currently in Pain? No/denies              [facility] PT Assessment - 08/21/20 2113      Assessment   Medical Diagnosis R ACA CVA, late effects of R MCA CVA    Referring Provider (PT) Garvin Fila, MD      Prior Function   Level of Independence Independent                         [facility] Adult PT Treatment/Exercise - 08/21/20 2114      Transfers   Sit to Stand 5: Supervision;With upper extremity assist    Sit to Stand Details Verbal cues for technique  Sit to Stand Details (indicate cue type and reason) cues first to stand before placing L hand in hand orthosis    Stand to Sit 5: Supervision      Ambulation/Gait   Ambulation/Gait Yes    Ambulation/Gait Assistance 5: Supervision;4: Min guard    Ambulation/Gait Assistance Details pt ambulated into clinic with RW without wearing L AFO. donned pt's AFO prior to assessing pt's gait, unable to fully assess distance due to pt reporting incr L foot pain while wearing AFO. when ambulating with RW, pt needing assist at times from therapist for LUE to be properly placed in L hand orthosis    Ambulation Distance (Feet) 100 Feet   x2, 20' x 1    Assistive device Straight cane;Rolling walker   SPC with 3 prong tip   Gait Pattern Step-through pattern;Decreased stance time - left;Decreased weight shift to left;Trunk rotated posteriorly on left;Decreased step length - right;Decreased arm swing - left;Step-to pattern;Poor  foot clearance - left    Ambulation Surface Level;Indoor      Therapeutic Activites    Therapeutic Activities Other Therapeutic Activities    Other Therapeutic Activities Pt wearing new shoes that she got from [employer] (wider size shoe) into clinic today but without wearing L AFO, but pt's daughter brought it into the clinic. Had discussion with pt and pt's daughter, that pt was fitted for shoe and was told by the employee that she did not have to continue to wear AFO. Discussed with pt and pt's daughter importance of wearing AFO for gait and all standing activity and purpose of AFO. Pt reports that she has not been wearing her AFO at all with her new shoes. Therapist put in pt's AFO and attempted assessing gait and pt's goals, but was limited due to pt reporting incr L foot pain (lateral and medial border) due to brace in shoe. Pt stated that when she was fitted for her shoe, she was not wearing her AFO at the time. Therapist educating pt to continue to wear older pair of shoes where AFO fit comfortably in to use for all standing and gait activity for improved foot clearance and gait mechanics. Discussed that may need to exchange new shoe for a larger size or when pt goes to have an appt at [facility] on 10/12 to see if they can put any pads in the side for incr comfort.                   PT Education - 08/21/20 2108    Education Details see TA    Person(s) Educated Patient;Child(ren)    Methods Explanation    Comprehension Verbalized understanding            PT Short Term Goals - 07/04/20 2015      PT SHORT TERM GOAL #1   Title ALL STGS = LTGS             PT Long Term Goals - 08/21/20 2110      PT LONG TERM GOAL #1   Title Pt will increase gait speed from  >0.57ms for improved gait safety and mobility. ALL LTGS DUE 08/22/20    Baseline 37.12 sec with RW = .27 m/s with RW, 39.82 seconds = . 25 m/s with SPC with 3 prong tip. ON 08/21/20: .21 m/s with RW, .22 m/s with SPC w 3  prong tip    Time 7   due to pt being on hold   Period Weeks  Status Not Met      PT LONG TERM GOAL #2   Title Pt will tolerate static standing for at least 5 minutes with no UE support with supervision in order to improve tolerance for ADLs.    Baseline unable to assess today - limited standing tolerance due to incr pain with L foot while wearing AFO    Time 7    Period Weeks    Status Deferred      PT LONG TERM GOAL #3   Title Pt will ambulate at least 230' over level indoor/unlevel outdoor surfaces with LRAD with L AFO with min guard in order to improve functional mobility.    Baseline unable to fully assess today due to pt having incr L foot pain while wearing AFO, was ambulating with supervision/min guard with SPC with 3 prong tip    Time 7    Period Weeks    Status Deferred      PT LONG TERM GOAL #4   Title Pt and pt's daughter will be independent with final HEP for weight bearing/lower extremity stretching/strengthening.    Time 7    Period Weeks    Status On-going            Revised/ongoing LTGs for re-cert:      PT Long Term Goals - 08/21/20 2134      PT LONG TERM GOAL #1   Title Pt will increase gait speed from  >0.41ms for improved gait safety and mobility. ALL LTGS DUE 09/18/20    Baseline 37.12 sec with RW = .27 m/s with RW, 39.82 seconds = . 25 m/s with SPC with 3 prong tip. ON 08/21/20: .21 m/s with RW, .22 m/s with SPC w 3 prong tip    Time 4    Period Weeks    Status On-going    Target Date 09/18/20      PT LONG TERM GOAL #2   Title Pt will perform 2 steps with SPC in LUE with min guard with step to pattern in order for pt to enter/exit daughter's home/    Time 4    Period Weeks    Status New      PT LONG TERM GOAL #3   Title Pt will ambulate at least 230' over level indoor/unlevel outdoor surfaces with LRAD with L AFO with min guard in order to improve functional mobility.    Baseline unable to fully assess today due to pt having incr L foot pain  while wearing AFO, was ambulating with supervision/min guard with SPC with 3 prong tip    Time 4    Period Weeks    Status On-going              Plan - 08/21/20 2125    Clinical Impression Statement Pt returns to PT after approx. a month - got Dysport injections for spasticity in LUE and LLE. Pt ambulates into clinic with RW and without L AFO. Pt got new shoes and reports that L AFO doesn't fit comfortably, causing incr L foot pain (has not been wearing AFO at home and gait distances limited in clinic today). Unable to fully assess pt's LTGs. Pt with a decr in gait speed today - .21 m/s with RW (previously .27 m/s) and .22 m/s with SPC with 3 prong tip (previously .25 m/s), indicating that pt is a household ambulator and is at an incr risk for falls. Will re-cert for an additional 2x week for 4 weeks to  continue to address gait mechanics, transfers, balance, strengthening, neuro re-ed in order to decr fall risk and improve functional independence. Will continue to progress towards LTGs.    Personal Factors and Comorbidities Comorbidity 3+;Past/Current Experience;Time since onset of injury/illness/exacerbation;Behavior Pattern    Comorbidities PMH significant for large right MCA CVA, HTN, T2DM, HLD, hx of Left foot fifth metatarsal fracture with osteopenia, asthma.    Examination-Activity Limitations Stand;Locomotion Level;Transfers;Toileting;Continence;Dressing    Examination-Participation Restrictions Community Activity    Stability/Clinical Decision Making Evolving/Moderate complexity    Rehab Potential Good    PT Frequency 2x / week    PT Duration 4 weeks    PT Treatment/Interventions ADLs/Self Care Home Management;Aquatic Therapy;Electrical Stimulation;DME Instruction;Gait training;Functional mobility training;Neuromuscular re-education;Balance training;Therapeutic exercise;Therapeutic activities;Patient/family education;Orthotic Fit/Training;Passive range of motion;Energy conservation     PT Next Visit Plan how is pt's shoe situation with L AFO? gait training. continue with stepping activities with RLE(with weight shifting towards LLE), gait training with SPC with quad tip - with focus on facilitation for L weight shift and hip extension during terminal stance.    Consulted and Agree with Plan of Care Patient;Family member/caregiver           Patient will benefit from skilled therapeutic intervention in order to improve the following deficits and impairments:  Abnormal gait, Decreased activity tolerance, Decreased coordination, Decreased balance, Decreased cognition, Decreased endurance, Decreased knowledge of use of DME, Decreased range of motion, Decreased strength, Difficulty walking, Impaired tone, Impaired UE functional use, Postural dysfunction, Impaired sensation  Visit Diagnosis: Muscle weakness (generalized)  Spastic hemiplegia of left nondominant side as late effect of cerebral infarction (HCC)  Unsteadiness on feet  Other abnormalities of gait and mobility     Problem List Patient Active Problem List   Diagnosis Date Noted   Cerebrovascular accident (CVA) due to thrombosis of precerebral artery ([place]) 11/30/2019   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 11/30/2019   Spastic hemiparesis affecting nondominant side ([place]) 11/30/2019   Chronic bilateral low back pain with sciatica 11/30/2019   Metatarsal fracture 09/22/2019   Depression 09/22/2019   Asthma 09/22/2019   Palliative care by specialist    Goals of care, counseling/discussion    Encephalopathy    Acute CVA (cerebrovascular accident) ([place]) 09/14/2019   Acute arterial ischemic stroke, multifocal, anterior circulation, right ([place]) 09/14/2019   Severe comorbid illness    AMS (altered mental status) 09/13/2019   Controlled diabetes mellitus type 2 with complications ([place]) 16/08/9603   Hyperlipidemia 09/02/2017   Hypertension 09/02/2017    Chest pain 09/02/2017    Arliss Journey , PT, DPT  08/21/2020, 9:28 PM  [facility] [address] [place], [place], [address] Phone: [phone]   Fax:  [phone]  Name: Christina Evans MRN: [id number] Date of Birth: 01/29/1961

## 2020-08-23 ENCOUNTER — Ambulatory Visit: Payer: Medicare Other | Attending: Neurology

## 2020-08-23 ENCOUNTER — Ambulatory Visit: Payer: Medicare Other | Admitting: Occupational Therapy

## 2020-08-23 ENCOUNTER — Encounter: Payer: Self-pay | Admitting: Occupational Therapy

## 2020-08-23 ENCOUNTER — Other Ambulatory Visit: Payer: Self-pay

## 2020-08-23 DIAGNOSIS — R2681 Unsteadiness on feet: Secondary | ICD-10-CM | POA: Diagnosis present

## 2020-08-23 DIAGNOSIS — I69354 Hemiplegia and hemiparesis following cerebral infarction affecting left non-dominant side: Secondary | ICD-10-CM | POA: Diagnosis present

## 2020-08-23 DIAGNOSIS — M6281 Muscle weakness (generalized): Secondary | ICD-10-CM | POA: Diagnosis present

## 2020-08-23 DIAGNOSIS — R2689 Other abnormalities of gait and mobility: Secondary | ICD-10-CM

## 2020-08-23 DIAGNOSIS — M25622 Stiffness of left elbow, not elsewhere classified: Secondary | ICD-10-CM | POA: Insufficient documentation

## 2020-08-23 DIAGNOSIS — M79602 Pain in left arm: Secondary | ICD-10-CM

## 2020-08-23 NOTE — Therapy (Signed)
[facility] [address] [place], [place], [address] Phone: [phone]   Fax:  [phone]  Physical Therapy Treatment  Patient Details  Name: Christina Evans MRN: [id number] Date of Birth: 02-10-1961 Referring Provider (PT): Micki Riley, MD   Encounter Date: 08/23/2020   PT End of Session - 08/23/20 1325    Visit Number 35    Number of Visits 41    Date for PT Re-Evaluation 10/20/20   written for 4 week POC   Authorization Type [employer] Medicare    Progress Note Due on Visit 39   did progress note on visit 19   PT Start Time 1317    PT Stop Time 1356    PT Time Calculation (min) 39 min    Equipment Utilized During Treatment Gait belt    Activity Tolerance Patient tolerated treatment well    Behavior During Therapy WFL for tasks assessed/performed           Past Medical History:  Diagnosis Date  . Asthma   . CVA (cerebral vascular accident) ([facility])   . Diabetes mellitus without complication [facility])     Past Surgical History:  Procedure Laterality Date  . IR ANGIO INTRA EXTRACRAN SEL COM CAROTID INNOMINATE BILAT MOD SED  09/15/2019  . IR ANGIO VERTEBRAL SEL VERTEBRAL UNI L MOD SED  09/15/2019    There were no vitals filed for this visit.   Subjective Assessment - 08/23/20 1326    Subjective Pt wearing her old sneakers today. Reports she is keeping the sneakers she got from [employer] without the AFO. PT did discuss trying to see if she can exchange shoes for one that would fit with brace better.    Pertinent History PMH significant for large right MCA CVA, HTN, T2DM, HLD, hx of Left foot fifth metatarsal fracture with osteopenia, asthma.    Limitations Standing;Walking    How long can you stand comfortably? 1 minute (when daughter is changing her when she is wearing diapers).    How long can you walk comfortably? a few feet.    Diagnostic tests MRI brain with moderate sized acute to early subacute left ACA  territory infarct andlarge chronic right MCA territory infarct.    Patient Stated Goals wants to get better, wants to be independent. work more on balance and stairs    Currently in Pain? No/denies                             [facility] Adult PT Treatment/Exercise - 08/23/20 1329      Transfers   Transfers Sit to Stand;Stand to Sit    Sit to Stand 5: Supervision    Stand to Sit 5: Supervision      Ambulation/Gait   Ambulation/Gait Yes    Ambulation/Gait Assistance 5: Supervision;4: Min guard;4: Min assist    Ambulation/Gait Assistance Details Pt ambulated in to gym on first bout with RW with PT trying to facilitate at pelvis to bring left pelvis forward more with swing phase. During second bout PT on still in front of patient holding therapist's upper arm on left to provide some support and right hand on therapist's shoulder. PT facilitating trunk rotation throughout    Ambulation Distance (Feet) 100 Feet   115' x 1   Assistive device Rolling walker   with left hand grip attachment   Gait Pattern Step-through pattern;Decreased step length - right;Decreased stance time - left;Trunk  rotated posteriorly on left    Ambulation Surface Level;Indoor      Neuro Re-ed    Neuro Re-ed Details  Standing in front of mat: with cane for support tapping 4" step with RLE x 10 working in left weight shift. Pt given tactile cues to help with weight shift and keep pelvis straight. Sit to stand x 3 with 4" step under right foot min assist with assist to weight shift left then maintaining standing upright each time. Performed in this position raising right arm overhead x 10 CGA.      Exercises   Exercises Other Exercises    Other Exercises  Bridges 5 x 2 with PT providing resistance to left anterior pelvis to facilitate rise. Sidelying left hip flexor stretch 30 sec x 3.                    PT Short Term Goals - 08/21/20 2134      PT SHORT TERM GOAL #1   Title ALL STGS = LTGS              PT Long Term Goals - 08/21/20 2134      PT LONG TERM GOAL #1   Title Pt will increase gait speed from  >0.61m/s for improved gait safety and mobility. ALL LTGS DUE 09/18/20    Baseline 37.12 sec with RW = .27 m/s with RW, 39.82 seconds = . 25 m/s with SPC with 3 prong tip. ON 08/21/20: .21 m/s with RW, .22 m/s with SPC w 3 prong tip    Time 4    Period Weeks    Status On-going    Target Date 09/18/20      PT LONG TERM GOAL #2   Title Pt will perform 2 steps with SPC in LUE with min guard with step to pattern in order for pt to enter/exit daughter's home/    Time 4    Period Weeks    Status New      PT LONG TERM GOAL #3   Title Pt will ambulate at least 230' over level indoor/unlevel outdoor surfaces with LRAD with L AFO with min guard in order to improve functional mobility.    Baseline unable to fully assess today due to pt having incr L foot pain while wearing AFO, was ambulating with supervision/min guard with SPC with 3 prong tip    Time 4    Period Weeks    Status On-going                 Plan - 08/23/20 1606    Clinical Impression Statement PT continued to focus on weight shifting to the left as well as anterior pelvic rotation on left with gait activities and with strengthening. Pt has difficulty performing on her own without tactile assist to facilitate.    Personal Factors and Comorbidities Comorbidity 3+;Past/Current Experience;Time since onset of injury/illness/exacerbation;Behavior Pattern    Comorbidities PMH significant for large right MCA CVA, HTN, T2DM, HLD, hx of Left foot fifth metatarsal fracture with osteopenia, asthma.    Examination-Activity Limitations Stand;Locomotion Level;Transfers;Toileting;Continence;Dressing    Examination-Participation Restrictions Community Activity    Stability/Clinical Decision Making Evolving/Moderate complexity    Rehab Potential Good    PT Frequency 2x / week    PT Duration 4 weeks    PT Treatment/Interventions  ADLs/Self Care Home Management;Aquatic Therapy;Electrical Stimulation;DME Instruction;Gait training;Functional mobility training;Neuromuscular re-education;Balance training;Therapeutic exercise;Therapeutic activities;Patient/family education;Orthotic Fit/Training;Passive range of motion;Energy conservation    PT Next  Visit Plan gait training. continue with stepping activities with RLE(with weight shifting towards LLE), gait training with SPC with quad tip - with focus on facilitation for L weight shift and hip extension during terminal stance.    Consulted and Agree with Plan of Care Patient;Family member/caregiver           Patient will benefit from skilled therapeutic intervention in order to improve the following deficits and impairments:  Abnormal gait, Decreased activity tolerance, Decreased coordination, Decreased balance, Decreased cognition, Decreased endurance, Decreased knowledge of use of DME, Decreased range of motion, Decreased strength, Difficulty walking, Impaired tone, Impaired UE functional use, Postural dysfunction, Impaired sensation  Visit Diagnosis: Other abnormalities of gait and mobility  Muscle weakness (generalized)     Problem List Patient Active Problem List   Diagnosis Date Noted  . Cerebrovascular accident (CVA) due to thrombosis of precerebral artery ([facility]) 11/30/2019  . 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 Spastic hemiparesis affecting nondominant side (HCC) 11/30/2019  . Chronic bilateral low back pain with sciatica 11/30/2019  . Metatarsal fracture 09/22/2019  . Depression 09/22/2019  . Asthma 09/22/2019  . Palliative care by specialist   . Goals of care, counseling/discussion   . Encephalopathy   . Acute CVA (cerebrovascular accident) ([facility]) 09/14/2019  . Acute arterial ischemic stroke, multifocal, anterior circulation, right (HCC) 09/14/2019  . Severe comorbid illness   . AMS (altered mental  status) 09/13/2019  . 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  . Hyperlipidemia 09/02/2017  . Hypertension 09/02/2017  . Chest pain 09/02/2017    Ronn Melena, PT, DPT, NCS 08/23/2020, 4:10 PM  [facility] [address] [place], [place], [address] Phone: [phone]   Fax:  [phone]  Name: Mireyah Chervenak MRN: [id number] Date of Birth: 03-25-1961

## 2020-08-23 NOTE — Therapy (Signed)
[facility] [address] [place], [place], [address] Phone: [phone]   Fax:  [phone]  Occupational Therapy Treatment  Patient Details  Name: Christina Evans MRN: [id number] Date of Birth: 04/16/1961 Referring Provider (OT): Dr. Leonie Man   Encounter Date: 08/23/2020   OT End of Session - 08/23/20 1402    Visit Number 32    Number of Visits 37    Date for OT Re-Evaluation 08/01/20    Authorization Type [employer] medicare and medicaid as secondary. Pt will need PN every 10th visit    Authorization Time Period renewed 06/27/20 for 5 week POC, cert for 90 days    Authorization - Visit Number 83    Authorization - Number of Visits 37    Progress Note Due on Visit 11    OT Start Time 1401    OT Stop Time 1445    OT Time Calculation (min) 44 min    Activity Tolerance Patient tolerated treatment well    Behavior During Therapy WFL for tasks assessed/performed           Past Medical History:  Diagnosis Date  . Asthma   . CVA (cerebral vascular accident) ([place])   . Diabetes mellitus without complication [facility])     Past Surgical History:  Procedure Laterality Date  . IR ANGIO INTRA EXTRACRAN SEL COM CAROTID INNOMINATE BILAT MOD SED  09/15/2019  . IR ANGIO VERTEBRAL SEL VERTEBRAL UNI L MOD SED  09/15/2019    There were no vitals filed for this visit.   Subjective Assessment - 08/23/20 1405    Subjective  Pt reports having a really bad headache yesterday. Denies any pain today.    Pertinent History Pt s/p recent R ACA CVA 09/13/19. 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 No/denies            Treatment:  Resistance Clothespins while weight bearing in LUE on mat. Reaching across body (RUE) for increased weight bearing in left and  reaching to antenna. (diagonal movements)   Opened different sized containers and tops with practice for utilizing LUE as stabilizer for opening containers.    Grasp/Release with 1 inch blocks - max A at elbow and shoulder. Verbal and visual cues for opening hand. Pt able to get thumb out and around blocks some of the time. Worked on Grasp/Release with cylindrical grasp on cones. Pt with minimal flexion of digits limiting grasping.         OT Short Term Goals - 06/27/20 0902      OT SHORT TERM GOAL #1   Title Pt and dtr will be mod  I with HEP to address ROM for RUE - 03/20/2020    Status Achieved      OT SHORT TERM GOAL #2   Title Pt and dtr will verbalize understanding for AE for cutting food on plate    Status Achieved      OT SHORT TERM GOAL #3   Title Pt will be mod I with washing face and set up for bushing teeth.    Status Achieved      OT SHORT TERM GOAL #4   Title Pt will be mod a for UB dressing    Status Achieved      OT SHORT TERM GOAL #5   Title  Pt will be mod a for LB dressing    Status Achieved      OT SHORT TERM GOAL #6   Title Pt will require mod a to hike pants    Status Achieved      OT SHORT TERM GOAL #7   Title Pt and dtr will verbalize understanding of toileting schedule to address incontinence    Status Achieved      OT SHORT TERM GOAL #8   Title Pt and dtr will be mod with splint wear and care (resting hand splint at night)    Status Achieved      OT SHORT TERM GOAL  #9   TITLE Pt will demonstrate ability to set table and wipe table (with dishes/silverware set on table) with supervision - 05/27/2020    Status Not Met   minguard in standing            OT Long Term Goals - 08/21/20 1332      OT LONG TERM GOAL #1   Title Pt and dtr will be mod I with upgraded home activities program to include focus ADL's, transitional movements and balance. -    Time 5    Period Weeks    Status On-going   needs reinforcement for carryover     OT LONG  TERM GOAL #2   Title Pt will be supervision for toilet transfers    Status Achieved      OT LONG TERM GOAL #3   Title Pt will be no more than min a for hiking pants and toilet hygiene    Status Achieved      OT LONG TERM GOAL #4   Title Pt will tolerate 110* of L shoulder flexion and 90* abduction during HEP for ease with ADL's with more than " a little " pain based on faces.    Status Achieved      OT LONG TERM GOAL #5   Title Pt will be supervision for simple snack prep    Status Deferred   not appropriate at this time.     OT LONG TERM GOAL #6   Title Pt and dtr will complete FOTO outcome scale    Status On-going   will be completed at d/c     OT LONG TERM GOAL #7   Title Pt will participate in at least 1 home management task per day with no more than min A    Baseline currently not conistently assisting her dtr.    Time 5    Status On-going   helping with hanging and folding her clothes 08/21/20     OT LONG TERM GOAL #8   Title Pt will use her LUE as a stabilizer/ gross A 10% of the time for ADLs/ IADLs.    Baseline barely using LUE at all    Time 5    Period Weeks    Status On-going   encouraging patient to utilize LUE more.     OT LONG TERM GOAL  #9   TITLE Pt wil demonstrate ability to grasp/ relase a cup with no more than min A    Baseline mod facillitation/ assist    Time 5    Period Weeks    Status Deferred   not appropriate at this time                Plan - 08/23/20 1418    Clinical Impression Statement Patient able to maintain flat hand on mat with  greater ease post botox in LUE for weight bearing. PT continues to benefit from skilled OT for NMR with LUE and increasing skills with ADLs and IADLs.    OT Occupational Profile and History Detailed Assessment- Review of Records and additional review of physical, cognitive, psychosocial history related to current functional performance    Occupational performance deficits (Please refer to evaluation for  details): ADL's;IADL's;Work;Leisure;Social Participation    Body Structure / Function / Physical Skills ADL;Balance;Continence;Endurance;GMC;IADL;Mobility;Sensation;ROM;Pain;Strength;Tone;UE functional use    Rehab Potential Good    Clinical Decision Making Multiple treatment options, significant modification of task necessary    Comorbidities Affecting Occupational Performance: Presence of comorbidities impacting occupational performance    Comorbidities impacting occupational performance description: see above    Modification or Assistance to Complete Evaluation  Max significant modification of tasks or assist is necessary to complete    OT Frequency 2x / week    OT Duration --   5 weeks   OT Treatment/Interventions Self-care/ADL training;Aquatic Therapy;Electrical Stimulation;Ultrasound;Moist Heat;Therapeutic exercise;Neuromuscular education;Manual Therapy;Functional Mobility Training;DME and/or AE instruction;Passive range of motion;Patient/family education;Cognitive remediation/compensation;Therapeutic activities;Splinting;Balance training    Plan review HEP and NMR LUE    Consulted and Agree with Plan of Care Patient;Family member/caregiver    Family Member Consulted dtr           Patient will benefit from skilled therapeutic intervention in order to improve the following deficits and impairments:   Body Structure / Function / Physical Skills: ADL, Balance, Continence, Endurance, GMC, IADL, Mobility, Sensation, ROM, Pain, Strength, Tone, UE functional use       Visit Diagnosis: Muscle weakness (generalized)  Spastic hemiplegia of left nondominant side as late effect of cerebral infarction (HCC)  Unsteadiness on feet  Other abnormalities of gait and mobility  Stiffness of left upper arm joint  Pain in left arm    Problem List Patient Active Problem List   Diagnosis Date Noted  . Cerebrovascular accident (CVA) due to thrombosis of precerebral artery ([place]) 11/30/2019  .  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 Spastic hemiparesis affecting nondominant side ([place]) 11/30/2019  . Chronic bilateral low back pain with sciatica 11/30/2019  . Metatarsal fracture 09/22/2019  . Depression 09/22/2019  . Asthma 09/22/2019  . Palliative care by specialist   . Goals of care, counseling/discussion   . Encephalopathy   . Acute CVA (cerebrovascular accident) ([place]) 09/14/2019  . Acute arterial ischemic stroke, multifocal, anterior circulation, right ([place]) 09/14/2019  . Severe comorbid illness   . AMS (altered mental status) 09/13/2019  . Controlled diabetes mellitus type 2 with complications ([place]) 09/38/1829  . Hyperlipidemia 09/02/2017  . Hypertension 09/02/2017  . Chest pain 09/02/2017    Zachery Conch MOT, OTR/L  08/23/2020, 2:50 PM  [facility] [address] [place], [place], [address] Phone: [phone]   Fax:  [phone]  Name: Christina Evans MRN: [id number] Date of Birth: Apr 06, 1961

## 2020-08-27 MED FILL — CITALOPRAM HBR 40 MG TABLET: 40 | 90 days supply | Qty: 90 | Fill #1

## 2020-08-27 MED FILL — ACCU-CHEK GUIDE STRP: 30 days supply | Qty: 100 | Fill #9

## 2020-08-28 ENCOUNTER — Encounter: Payer: Self-pay | Admitting: Occupational Therapy

## 2020-08-28 ENCOUNTER — Ambulatory Visit: Payer: Medicare Other | Admitting: Occupational Therapy

## 2020-08-28 ENCOUNTER — Other Ambulatory Visit: Payer: Self-pay

## 2020-08-28 ENCOUNTER — Ambulatory Visit: Payer: Medicare Other

## 2020-08-28 DIAGNOSIS — I69354 Hemiplegia and hemiparesis following cerebral infarction affecting left non-dominant side: Secondary | ICD-10-CM

## 2020-08-28 DIAGNOSIS — R2689 Other abnormalities of gait and mobility: Secondary | ICD-10-CM

## 2020-08-28 DIAGNOSIS — R2681 Unsteadiness on feet: Secondary | ICD-10-CM

## 2020-08-28 DIAGNOSIS — M6281 Muscle weakness (generalized): Secondary | ICD-10-CM

## 2020-08-28 DIAGNOSIS — M25622 Stiffness of left elbow, not elsewhere classified: Secondary | ICD-10-CM

## 2020-08-28 NOTE — Therapy (Signed)
[facility] [address] [place], [place], [address] Phone: [phone]   Fax:  [phone]  Physical Therapy Treatment  Patient Details  Name: Christina Evans MRN: [id number] Date of Birth: [birth year] Referring Provider (PT): Micki Riley, MD   Encounter Date: 08/28/2020   PT End of Session - 08/28/20 1401    Visit Number 36    Number of Visits 41    Date for PT Re-Evaluation 10/20/20   written for 4 week POC   Authorization Type [employer] Medicare    Progress Note Due on Visit 39   did progress note on visit 19   PT Start Time 1401    PT Stop Time 1444    PT Time Calculation (min) 43 min    Equipment Utilized During Treatment Gait belt    Activity Tolerance Patient tolerated treatment well    Behavior During Therapy WFL for tasks assessed/performed           Past Medical History:  Diagnosis Date  . Asthma   . CVA (cerebral vascular accident) (H[facility])   . Diabetes mellitus without complication [facility])     Past Surgical History:  Procedure Laterality Date  . IR ANGIO INTRA EXTRACRAN SEL COM CAROTID INNOMINATE BILAT MOD SED  09/15/2019  . IR ANGIO VERTEBRAL SEL VERTEBRAL UNI L MOD SED  09/15/2019    There were no vitals filed for this visit.   Subjective Assessment - 08/28/20 1401    Subjective Pt reports she is doing well. They have appointment with [employer] on 10/12 to check with orthotist about the new shoes and see if they will work before modiying them. Pt's family had to call EMS the week due to pt choking on a piece of chicken. Daughters did heimlich and were able to get it out before EMS arrived.    Pertinent History PMH significant for large right MCA CVA, HTN, T2DM, HLD, hx of Left foot fifth metatarsal fracture with osteopenia, asthma.    Limitations Standing;Walking    How long can you stand comfortably? 1 minute (when daughter is changing her when she is wearing diapers).    How long can you walk  comfortably? a few feet.    Diagnostic tests MRI brain with moderate sized acute to early subacute left ACA territory infarct andlarge chronic right MCA territory infarct.    Patient Stated Goals wants to get better, wants to be independent. work more on balance and stairs    Currently in Pain? No/denies                             [facility] Adult PT Treatment/Exercise - 08/28/20 1405      Transfers   Transfers Sit to Stand;Stand to Sit    Sit to Stand 5: Supervision    Stand to Sit 5: Supervision      Ambulation/Gait   Ambulation/Gait Yes    Ambulation/Gait Assistance 5: Supervision;4: Min guard    Ambulation/Gait Assistance Details PT facilitated left weight shift with gait and tried to get more anterior left pelvic rotation.    Ambulation Distance (Feet) 115 Feet   115' x 1 with RW   Assistive device Straight cane;Rolling walker   with quad tip. Left hand grip attachment with RW   Gait Pattern Step-through pattern;Decreased step length - right;Decreased stance time - left;Trunk rotated posteriorly on left    Ambulation Surface Level;Indoor    Stairs Yes  Stairs Assistance 4: Min assist;3: Mod assist    Stairs Assistance Details (indicate cue type and reason) with cane with quad tip with min assist with ascent with verbal cues for technique then mod assist with descent with PT assisting to bring left leg off step    Stair Management Technique With cane;Step to pattern    Number of Stairs 4    Curb 4: Min assist;3: Mod assist    Curb Details (indicate cue type and reason) trialed with walking going up forwards and down backwards. Also trialed with stepping down forwards. Pt more comfortable with stepping down back with left leg as needed mod assist to bring left leg down fowards. Pt also needed to remove left hand from walker when setting up on curb as hurt her arm to be up higher.    Pre-Gait Activities Standing at mat stepping fowards and back with RLE with holding PT  hand overhead to lengthen right side x 10    Gait Comments Stepping up/down on bottom step with cane x 5 min assist with verbal cues for technique                  PT Education - 08/28/20 1649    Education Details Step instruction with pt and daughter. Advised using walker and stepping down backwards was safest with pt as she had 2 wide steps at other daughter's house. Also to always have daughters assisting.    Person(s) Educated Patient;Child(ren)    Methods Explanation;Demonstration    Comprehension Verbalized understanding            PT Short Term Goals - 08/21/20 2134      PT SHORT TERM GOAL #1   Title ALL STGS = LTGS             PT Long Term Goals - 08/21/20 2134      PT LONG TERM GOAL #1   Title Pt will increase gait speed from  >0.71m/s for improved gait safety and mobility. ALL LTGS DUE 09/18/20    Baseline 37.12 sec with RW = .27 m/s with RW, 39.82 seconds = . 25 m/s with SPC with 3 prong tip. ON 08/21/20: .21 m/s with RW, .22 m/s with SPC w 3 prong tip    Time 4    Period Weeks    Status On-going    Target Date 09/18/20      PT LONG TERM GOAL #2   Title Pt will perform 2 steps with SPC in LUE with min guard with step to pattern in order for pt to enter/exit daughter's home/    Time 4    Period Weeks    Status New      PT LONG TERM GOAL #3   Title Pt will ambulate at least 230' over level indoor/unlevel outdoor surfaces with LRAD with L AFO with min guard in order to improve functional mobility.    Baseline unable to fully assess today due to pt having incr L foot pain while wearing AFO, was ambulating with supervision/min guard with SPC with 3 prong tip    Time 4    Period Weeks    Status On-going                 Plan - 08/28/20 1650    Clinical Impression Statement Pt was challenged with negotiating steps needing max cues for correct sequencing. Pt does not have rails and at other daughter's house. Did best with backing down the step  as  very fearful with stepping forwards with LLE and needing mod assist to advance leg forward.    Personal Factors and Comorbidities Comorbidity 3+;Past/Current Experience;Time since onset of injury/illness/exacerbation;Behavior Pattern    Comorbidities PMH significant for large right MCA CVA, HTN, T2DM, HLD, hx of Left foot fifth metatarsal fracture with osteopenia, asthma.    Examination-Activity Limitations Stand;Locomotion Level;Transfers;Toileting;Continence;Dressing    Examination-Participation Restrictions Community Activity    Stability/Clinical Decision Making Evolving/Moderate complexity    Rehab Potential Good    PT Frequency 2x / week    PT Duration 4 weeks    PT Treatment/Interventions ADLs/Self Care Home Management;Aquatic Therapy;Electrical Stimulation;DME Instruction;Gait training;Functional mobility training;Neuromuscular re-education;Balance training;Therapeutic exercise;Therapeutic activities;Patient/family education;Orthotic Fit/Training;Passive range of motion;Energy conservation    PT Next Visit Plan Contiue to work on step training. gait training. continue with stepping activities with RLE(with weight shifting towards LLE), gait training with SPC with quad tip - with focus on facilitation for L weight shift and hip extension during terminal stance.    Consulted and Agree with Plan of Care Patient;Family member/caregiver           Patient will benefit from skilled therapeutic intervention in order to improve the following deficits and impairments:  Abnormal gait, Decreased activity tolerance, Decreased coordination, Decreased balance, Decreased cognition, Decreased endurance, Decreased knowledge of use of DME, Decreased range of motion, Decreased strength, Difficulty walking, Impaired tone, Impaired UE functional use, Postural dysfunction, Impaired sensation  Visit Diagnosis: Other abnormalities of gait and mobility  Muscle weakness (generalized)     Problem  List Patient Active Problem List   Diagnosis Date Noted  . Cerebrovascular accident (CVA) due to thrombosis of precerebral artery (HCC) 11/30/2019  . 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 Spastic hemiparesis affecting nondominant side (HCC) 11/30/2019  . Chronic bilateral low back pain with sciatica 11/30/2019  . Metatarsal fracture 09/22/2019  . Depression 09/22/2019  . Asthma 09/22/2019  . Palliative care by specialist   . Goals of care, counseling/discussion   . Encephalopathy   . Acute CVA (cerebrovascular accident) ([facility]) 09/14/2019  . Acute arterial ischemic stroke, multifocal, anterior circulation, right (HCC) 09/14/2019  . Severe comorbid illness   . AMS (altered mental status) 09/13/2019  . 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  . Hyperlipidemia 09/02/2017  . Hypertension 09/02/2017  . Chest pain 09/02/2017    Ronn Melena, PT, DPT, NCS 08/28/2020, 4:56 PM  [facility] [address] [place], [place], [address] Phone: [phone]   Fax:  [phone]  Name: Christina Evans MRN: [id number] Date of Birth: 02/09/1961

## 2020-08-28 NOTE — Therapy (Signed)
[facility] [address] [place], [place], [address] Phone: [phone]   Fax:  [phone]  Occupational Therapy Treatment  Patient Details  Name: Christina Evans MRN: [id number] Date of Birth: Oct 26, 1961 Referring Provider (OT): Dr. Leonie Man   Encounter Date: 08/28/2020   OT End of Session - 08/28/20 1326    Visit Number 33    Number of Visits 40    Date for OT Re-Evaluation 09/20/20    Authorization Type U[employer] medicare and medicaid as secondary. Pt will need PN every 10th visit    Authorization Time Period renewed 06/27/20 for 5 week POC, cert for 90 days    Authorization - Visit Number 62    Authorization - Number of Visits 40   renewal was visit 31   Progress Note Due on Visit 45    OT Start Time 1321   2 units, pt in BR   OT Stop Time 1400    OT Time Calculation (min) 39 min    Activity Tolerance Patient tolerated treatment well    Behavior During Therapy WFL for tasks assessed/performed           Past Medical History:  Diagnosis Date  . Asthma   . CVA (cerebral vascular accident) ([place])   . Diabetes mellitus without complication [facility])     Past Surgical History:  Procedure Laterality Date  . IR ANGIO INTRA EXTRACRAN SEL COM CAROTID INNOMINATE BILAT MOD SED  09/15/2019  . IR ANGIO VERTEBRAL SEL VERTEBRAL UNI L MOD SED  09/15/2019    There were no vitals filed for this visit.   Subjective Assessment - 08/28/20 1325    Pertinent History Pt s/p recent R ACA CVA 09/13/19. 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                Treatment: supine closed chain self ROM shoulder flexion, then sidelying on left side AA/ROM elbow flexion/ extension and supination/ pronation, followed by seated  lightweightbearing through bilateral UE's mod facilitation. Then functional grasp/ release of 1 inch blocks with LUE and gross A to hold a bottle and open with RUE, min facilitation/ v.c                  OT Short Term Goals - 06/27/20 0902      OT SHORT TERM GOAL #1   Title Pt and dtr will be mod  I with HEP to address ROM for RUE - 03/20/2020    Status Achieved      OT SHORT TERM GOAL #2   Title Pt and dtr will verbalize understanding for AE for cutting food on plate    Status Achieved      OT SHORT TERM GOAL #3   Title Pt will be mod I with washing face and set up for bushing teeth.    Status Achieved      OT SHORT TERM GOAL #4   Title Pt will be mod a for UB dressing    Status Achieved      OT SHORT TERM GOAL #5   Title Pt will be mod a for LB dressing    Status Achieved      OT SHORT TERM GOAL #6   Title Pt will require mod a to  hike pants    Status Achieved      OT SHORT TERM GOAL #7   Title Pt and dtr will verbalize understanding of toileting schedule to address incontinence    Status Achieved      OT SHORT TERM GOAL #8   Title Pt and dtr will be mod with splint wear and care (resting hand splint at night)    Status Achieved      OT SHORT TERM GOAL  #9   TITLE Pt will demonstrate ability to set table and wipe table (with dishes/silverware set on table) with supervision - 05/27/2020    Status Not Met   minguard in standing            OT Long Term Goals - 08/28/20 1608      OT LONG TERM GOAL #1   Title Pt and dtr will be mod I with upgraded home activities program to include focus ADL's, transitional movements and balance. -    Time 5    Period Weeks    Status On-going   needs reinforcement for carryover     OT LONG TERM GOAL #5   Status --   not appropriate at this time.     OT LONG TERM GOAL #6   Title Pt and dtr will complete FOTO outcome scale    Status On-going   will be completed at d/c     OT LONG TERM GOAL #7   Title Pt will  participate in at least 1 home management task per day with no more than min A    Baseline currently not conistently assisting her dtr.    Time 5    Status On-going   helping with hanging and folding her clothes 08/21/20     OT LONG TERM GOAL #8   Title Pt will use her LUE as a stabilizer/ gross A 10% of the time for ADLs/ IADLs.    Baseline barely using LUE at all    Time 5    Period Weeks    Status On-going   encouraging patient to utilize LUE more.     OT LONG TERM GOAL  #9   Status --   not appropriate at this time                Plan - 08/28/20 1604    Clinical Impression Statement Pt is progressing towards goals with improving light functional use and weightbearing in clinic. Pt does not demonstrate significant carry over at home.    OT Occupational Profile and History Detailed Assessment- Review of Records and additional review of physical, cognitive, psychosocial history related to current functional performance    Occupational performance deficits (Please refer to evaluation for details): ADL's;IADL's;Work;Leisure;Social Participation    Body Structure / Function / Physical Skills ADL;Balance;Continence;Endurance;GMC;IADL;Mobility;Sensation;ROM;Pain;Strength;Tone;UE functional use    Rehab Potential Good    Clinical Decision Making Multiple treatment options, significant modification of task necessary    Comorbidities Affecting Occupational Performance: Presence of comorbidities impacting occupational performance    Comorbidities impacting occupational performance description: see above    Modification or Assistance to Complete Evaluation  Max significant modification of tasks or assist is necessary to complete    OT Frequency 2x / week    OT Duration --   5 weeks   OT Treatment/Interventions Self-care/ADL training;Aquatic Therapy;Electrical Stimulation;Ultrasound;Moist Heat;Therapeutic exercise;Neuromuscular education;Manual Therapy;Functional Mobility Training;DME and/or  AE instruction;Passive range of motion;Patient/family education;Cognitive remediation/compensation;Therapeutic activities;Splinting;Balance training    Plan NMR LUE  Consulted and Agree with Plan of Care Patient;Family member/caregiver    Family Member Consulted dtr           Patient will benefit from skilled therapeutic intervention in order to improve the following deficits and impairments:   Body Structure / Function / Physical Skills: ADL, Balance, Continence, Endurance, GMC, IADL, Mobility, Sensation, ROM, Pain, Strength, Tone, UE functional use       Visit Diagnosis: Muscle weakness (generalized)  Spastic hemiplegia of left nondominant side as late effect of cerebral infarction (HCC)  Unsteadiness on feet  Stiffness of left upper arm joint    Problem List Patient Active Problem List   Diagnosis Date Noted  . Cerebrovascular accident (CVA) due to thrombosis of precerebral artery ([place]) 11/30/2019  . 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 Spastic hemiparesis affecting nondominant side ([place]) 11/30/2019  . Chronic bilateral low back pain with sciatica 11/30/2019  . Metatarsal fracture 09/22/2019  . Depression 09/22/2019  . Asthma 09/22/2019  . Palliative care by specialist   . Goals of care, counseling/discussion   . Encephalopathy   . Acute CVA (cerebrovascular accident) ([place]) 09/14/2019  . Acute arterial ischemic stroke, multifocal, anterior circulation, right ([place]) 09/14/2019  . Severe comorbid illness   . AMS (altered mental status) 09/13/2019  . Controlled diabetes mellitus type 2 with complications ([place]) 83/77/9396  . Hyperlipidemia 09/02/2017  . Hypertension 09/02/2017  . Chest pain 09/02/2017    Beulah Matusek 08/28/2020, 4:08 PM  [facility] [address] [place], [place], [address] Phone: [phone]   Fax:  [phone]  Name: Christina Evans MRN: [id number] Date of Birth: 12/07/1960

## 2020-08-30 ENCOUNTER — Ambulatory Visit: Payer: Medicare Other

## 2020-08-30 ENCOUNTER — Ambulatory Visit: Payer: Medicare Other | Admitting: Occupational Therapy

## 2020-09-04 ENCOUNTER — Ambulatory Visit: Payer: Medicare Other | Admitting: Occupational Therapy

## 2020-09-04 ENCOUNTER — Ambulatory Visit: Payer: Medicare Other

## 2020-09-04 ENCOUNTER — Other Ambulatory Visit: Payer: Self-pay

## 2020-09-04 DIAGNOSIS — R2689 Other abnormalities of gait and mobility: Secondary | ICD-10-CM

## 2020-09-04 DIAGNOSIS — M79602 Pain in left arm: Secondary | ICD-10-CM

## 2020-09-04 DIAGNOSIS — R2681 Unsteadiness on feet: Secondary | ICD-10-CM

## 2020-09-04 DIAGNOSIS — M6281 Muscle weakness (generalized): Secondary | ICD-10-CM

## 2020-09-04 DIAGNOSIS — I69354 Hemiplegia and hemiparesis following cerebral infarction affecting left non-dominant side: Secondary | ICD-10-CM

## 2020-09-04 DIAGNOSIS — M25622 Stiffness of left elbow, not elsewhere classified: Secondary | ICD-10-CM

## 2020-09-04 NOTE — Therapy (Signed)
[facility] [address] [place], [place], [address] Phone: [phone]   Fax:  [phone]  Occupational Therapy Treatment  Patient Details  Name: Christina Evans MRN: [id number] Date of Birth: 10/06/1961 Referring Provider (OT): Dr. Leonie Man   Encounter Date: 09/04/2020   OT End of Session - 09/04/20 1323    Visit Number 34    Number of Visits 40    Date for OT Re-Evaluation 09/20/20    Authorization Type [employer]HC medicare and medicaid as secondary. Pt will need PN every 10th visit    Authorization Time Period renewed 06/27/20 for 5 week POC, cert for 90 days    Authorization - Visit Number 20    Authorization - Number of Visits 40   renewal was visit 31   Progress Note Due on Visit 43    OT Start Time 1318    OT Stop Time 1400    OT Time Calculation (min) 42 min    Activity Tolerance Patient tolerated treatment well    Behavior During Therapy WFL for tasks assessed/performed           Past Medical History:  Diagnosis Date  . Asthma   . CVA (cerebral vascular accident) ([place])   . Diabetes mellitus without complication [facility])     Past Surgical History:  Procedure Laterality Date  . IR ANGIO INTRA EXTRACRAN SEL COM CAROTID INNOMINATE BILAT MOD SED  09/15/2019  . IR ANGIO VERTEBRAL SEL VERTEBRAL UNI L MOD SED  09/15/2019    There were no vitals filed for this visit.   Subjective Assessment - 09/04/20 1326    Subjective  Pt's daughter reports pt is doing things more independently and checked in for her appts.    Pertinent History Pt s/p recent R ACA CVA 09/13/19. 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    Currently in Pain? No/denies             Treatment:  Neuromuscular Reeducation for LUE - weight  bearing in LUE, shoulder flexion with stool with mod A and dependent for maintaining hand position, grasp release with foam ball and with cones with LUE requiring max A                     OT Short Term Goals - 06/27/20 0902      OT SHORT TERM GOAL #1   Title Pt and dtr will be mod  I with HEP to address ROM for RUE - 03/20/2020    Status Achieved      OT SHORT TERM GOAL #2   Title Pt and dtr will verbalize understanding for AE for cutting food on plate    Status Achieved      OT SHORT TERM GOAL #3   Title Pt will be mod I with washing face and set up for bushing teeth.    Status Achieved      OT SHORT TERM GOAL #4   Title Pt will be mod a for UB dressing    Status Achieved      OT SHORT TERM GOAL #5   Title Pt will be mod a for LB dressing    Status Achieved      OT SHORT TERM GOAL #6   Title Pt  will require mod a to hike pants    Status Achieved      OT SHORT TERM GOAL #7   Title Pt and dtr will verbalize understanding of toileting schedule to address incontinence    Status Achieved      OT SHORT TERM GOAL #8   Title Pt and dtr will be mod with splint wear and care (resting hand splint at night)    Status Achieved      OT SHORT TERM GOAL  #9   TITLE Pt will demonstrate ability to set table and wipe table (with dishes/silverware set on table) with supervision - 05/27/2020    Status Not Met   minguard in standing            OT Long Term Goals - 08/28/20 1608      OT LONG TERM GOAL #1   Title Pt and dtr will be mod I with upgraded home activities program to include focus ADL's, transitional movements and balance. -    Time 5    Period Weeks    Status On-going   needs reinforcement for carryover     OT LONG TERM GOAL #5   Status --   not appropriate at this time.     OT LONG TERM GOAL #6   Title Pt and dtr will complete FOTO outcome scale    Status On-going   will be completed at d/c     OT LONG TERM GOAL #7   Title Pt will participate in at  least 1 home management task per day with no more than min A    Baseline currently not conistently assisting her dtr.    Time 5    Status On-going   helping with hanging and folding her clothes 08/21/20     OT LONG TERM GOAL #8   Title Pt will use her LUE as a stabilizer/ gross A 10% of the time for ADLs/ IADLs.    Baseline barely using LUE at all    Time 5    Period Weeks    Status On-going   encouraging patient to utilize LUE more.     OT LONG TERM GOAL  #9   Status --   not appropriate at this time                Plan - 09/04/20 1324    Clinical Impression Statement Pt demonstrates minimal carryover at home. Pt progressing towards goals.    OT Occupational Profile and History Detailed Assessment- Review of Records and additional review of physical, cognitive, psychosocial history related to current functional performance    Occupational performance deficits (Please refer to evaluation for details): ADL's;IADL's;Work;Leisure;Social Participation    Body Structure / Function / Physical Skills ADL;Balance;Continence;Endurance;GMC;IADL;Mobility;Sensation;ROM;Pain;Strength;Tone;UE functional use    Rehab Potential Good    Clinical Decision Making Multiple treatment options, significant modification of task necessary    Comorbidities Affecting Occupational Performance: Presence of comorbidities impacting occupational performance    Comorbidities impacting occupational performance description: see above    Modification or Assistance to Complete Evaluation  Max significant modification of tasks or assist is necessary to complete    OT Frequency 2x / week    OT Duration --   5 weeks   OT Treatment/Interventions Self-care/ADL training;Aquatic Therapy;Electrical Stimulation;Ultrasound;Moist Heat;Therapeutic exercise;Neuromuscular education;Manual Therapy;Functional Mobility Training;DME and/or AE instruction;Passive range of motion;Patient/family education;Cognitive  remediation/compensation;Therapeutic activities;Splinting;Balance training    Plan NMR LUE    Consulted and Agree with Plan of Care Patient;Family  member/caregiver    Family Member Consulted dtr           Patient will benefit from skilled therapeutic intervention in order to improve the following deficits and impairments:   Body Structure / Function / Physical Skills: ADL, Balance, Continence, Endurance, GMC, IADL, Mobility, Sensation, ROM, Pain, Strength, Tone, UE functional use       Visit Diagnosis: Muscle weakness (generalized)  Spastic hemiplegia of left nondominant side as late effect of cerebral infarction (HCC)  Unsteadiness on feet  Stiffness of left upper arm joint  Other abnormalities of gait and mobility  Pain in left arm    Problem List Patient Active Problem List   Diagnosis Date Noted  . Cerebrovascular accident (CVA) due to thrombosis of precerebral artery ([place]) 11/30/2019  . 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 Spastic hemiparesis affecting nondominant side ([place]) 11/30/2019  . Chronic bilateral low back pain with sciatica 11/30/2019  . Metatarsal fracture 09/22/2019  . Depression 09/22/2019  . Asthma 09/22/2019  . Palliative care by specialist   . Goals of care, counseling/discussion   . Encephalopathy   . Acute CVA (cerebrovascular accident) ([place]) 09/14/2019  . Acute arterial ischemic stroke, multifocal, anterior circulation, right ([place]) 09/14/2019  . Severe comorbid illness   . AMS (altered mental status) 09/13/2019  . Controlled diabetes mellitus type 2 with complications ([place]) 15/51/6144  . Hyperlipidemia 09/02/2017  . Hypertension 09/02/2017  . Chest pain 09/02/2017    Zachery Conch MOT, OTR/L  09/04/2020, 2:12 PM  [facility] [address] [place], [place], [address] Phone: [phone]   Fax:   [phone]  Name: Christina Evans MRN: [id number] Date of Birth: 27-Jan-1961

## 2020-09-04 NOTE — Therapy (Signed)
[facility] [address] [place], [place], [address] Phone: [phone]   Fax:  [phone]  Physical Therapy Treatment  Patient Details  Name: Christina Evans MRN: [id number] Date of Birth: 1961/04/03 Referring Provider (PT): Micki Riley, MD   Encounter Date: 09/04/2020   PT End of Session - 09/04/20 1403    Visit Number 37    Number of Visits 41    Date for PT Re-Evaluation 10/20/20   written for 4 week POC   Authorization Type UHC Medicare    Progress Note Due on Visit 39   did progress note on visit 19   PT Start Time 1400    PT Stop Time 1443    PT Time Calculation (min) 43 min    Equipment Utilized During Treatment Gait belt    Activity Tolerance Patient tolerated treatment well    Behavior During Therapy WFL for tasks assessed/performed           Past Medical History:  Diagnosis Date  . Asthma   . CVA (cerebral vascular accident) (HCC)   . Diabetes mellitus without complication [facility])     Past Surgical History:  Procedure Laterality Date  . IR ANGIO INTRA EXTRACRAN SEL COM CAROTID INNOMINATE BILAT MOD SED  09/15/2019  . IR ANGIO VERTEBRAL SEL VERTEBRAL UNI L MOD SED  09/15/2019    There were no vitals filed for this visit.   Subjective Assessment - 09/04/20 1404    Subjective Pt went to [employer] yesterday and Thayer Ohm said the shoes would work ok. He questioned adding the build up to bottom of the shoe as thought could affect her back and wanted to talk to PT. He did put pad under foot for brace and pt reports brace is feeling better.    Pertinent History PMH significant for large right MCA CVA, HTN, T2DM, HLD, hx of Left foot fifth metatarsal fracture with osteopenia, asthma.    Limitations Standing;Walking    How long can you stand comfortably? 1 minute (when daughter is changing her when she is wearing diapers).    How long can you walk comfortably? a few feet.    Diagnostic tests MRI brain  with moderate sized acute to early subacute left ACA territory infarct andlarge chronic right MCA territory infarct.    Patient Stated Goals wants to get better, wants to be independent. work more on balance and stairs    Currently in Pain? No/denies                             [facility] Adult PT Treatment/Exercise - 09/04/20 1407      Transfers   Transfers Sit to Stand;Stand to Sit    Sit to Stand 5: Supervision    Stand to Sit 5: Supervision      Ambulation/Gait   Ambulation/Gait Yes    Ambulation/Gait Assistance 5: Supervision;4: Min guard    Ambulation/Gait Assistance Details Pt was cued to try to bring left hip forward more. PT facilitated at pelvis to try to get more left anterior pelvic rotation.     Ambulation Distance (Feet) 230 Feet   115' x 1   Assistive device Straight cane   with quad tip, and left AFO   Gait Pattern Step-through pattern;Decreased stance time - left;Decreased hip/knee flexion - left;Trunk rotated posteriorly on left    Ambulation Surface Level;Indoor    Ramp 4: Min assist  Neuro Re-ed    Neuro Re-ed Details  Gait over obstacle course: stepping over 2 foam beams, weaving in and out of 4 cones and walking over blue mat with cane x 4 CGA. Pt was able to step over foam beams with both legs and did better when led with LLE.  Pt does hip hike to clear left leg due to decreased hip/knee flexion.                  PT Education - 09/04/20 1657    Education Details PT spoke with primary PT, Chloe, who had initially contacted [employer]. She was going to call [employer] and discuss riser for shoe so pt's daughter could get her scheduled for this.    Person(s) Educated Patient;Child(ren)    Methods Explanation    Comprehension Verbalized understanding            PT Short Term Goals - 08/21/20 2134      PT SHORT TERM GOAL #1   Title ALL STGS = LTGS             PT Long Term Goals - 08/21/20 2134      PT LONG TERM GOAL #1   Title Pt  will increase gait speed from  >0.65m/s for improved gait safety and mobility. ALL LTGS DUE 09/18/20    Baseline 37.12 sec with RW = .27 m/s with RW, 39.82 seconds = . 25 m/s with SPC with 3 prong tip. ON 08/21/20: .21 m/s with RW, .22 m/s with SPC w 3 prong tip    Time 4    Period Weeks    Status On-going    Target Date 09/18/20      PT LONG TERM GOAL #2   Title Pt will perform 2 steps with SPC in LUE with min guard with step to pattern in order for pt to enter/exit daughter's home/    Time 4    Period Weeks    Status New      PT LONG TERM GOAL #3   Title Pt will ambulate at least 230' over level indoor/unlevel outdoor surfaces with LRAD with L AFO with min guard in order to improve functional mobility.    Baseline unable to fully assess today due to pt having incr L foot pain while wearing AFO, was ambulating with supervision/min guard with SPC with 3 prong tip    Time 4    Period Weeks    Status On-going                 Plan - 09/04/20 1721    Clinical Impression Statement Pt was able to increase gait with SPC. She reported some pain in left foot but reports she always does and can push through. Orthotist had added pad to bottom of AFO to soften some.    Personal Factors and Comorbidities Comorbidity 3+;Past/Current Experience;Time since onset of injury/illness/exacerbation;Behavior Pattern    Comorbidities PMH significant for large right MCA CVA, HTN, T2DM, HLD, hx of Left foot fifth metatarsal fracture with osteopenia, asthma.    Examination-Activity Limitations Stand;Locomotion Level;Transfers;Toileting;Continence;Dressing    Examination-Participation Restrictions Community Activity    Stability/Clinical Decision Making Evolving/Moderate complexity    Rehab Potential Good    PT Frequency 2x / week    PT Duration 4 weeks    PT Treatment/Interventions ADLs/Self Care Home Management;Aquatic Therapy;Electrical Stimulation;DME Instruction;Gait training;Functional mobility  training;Neuromuscular re-education;Balance training;Therapeutic exercise;Therapeutic activities;Patient/family education;Orthotic Fit/Training;Passive range of motion;Energy conservation    PT Next Visit Plan  Did they schedule with [employer] to get lift on bottom of shoe. Contiue to work on step training. Investment banker, operational. continue with stepping activities with RLE(with weight shifting towards LLE), gait training with SPC with quad tip - with focus on facilitation for L weight shift and hip extension during terminal stance.    Consulted and Agree with Plan of Care Patient;Family member/caregiver           Patient will benefit from skilled therapeutic intervention in order to improve the following deficits and impairments:  Abnormal gait, Decreased activity tolerance, Decreased coordination, Decreased balance, Decreased cognition, Decreased endurance, Decreased knowledge of use of DME, Decreased range of motion, Decreased strength, Difficulty walking, Impaired tone, Impaired UE functional use, Postural dysfunction, Impaired sensation  Visit Diagnosis: Other abnormalities of gait and mobility  Muscle weakness (generalized)     Problem List Patient Active Problem List   Diagnosis Date Noted  . Cerebrovascular accident (CVA) due to thrombosis of precerebral artery (HCC) 11/30/2019  . 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 Spastic hemiparesis affecting nondominant side (HCC) 11/30/2019  . Chronic bilateral low back pain with sciatica 11/30/2019  . Metatarsal fracture 09/22/2019  . Depression 09/22/2019  . Asthma 09/22/2019  . Palliative care by specialist   . Goals of care, counseling/discussion   . Encephalopathy   . Acute CVA (cerebrovascular accident) ([facility]) 09/14/2019  . Acute arterial ischemic stroke, multifocal, anterior circulation, right (HCC) 09/14/2019  . Severe comorbid illness   . AMS (altered mental status) 09/13/2019  .  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  . Hyperlipidemia 09/02/2017  . Hypertension 09/02/2017  . Chest pain 09/02/2017    Ronn Melena, PT, DPT, NCS 09/04/2020, 5:27 PM  [facility] [address] [place], [place], [address] Phone: [phone]   Fax:  [phone]  Name: Coretha Creswell MRN: [id number] Date of Birth: 04/17/1961

## 2020-09-06 ENCOUNTER — Other Ambulatory Visit: Payer: Self-pay

## 2020-09-06 ENCOUNTER — Encounter: Payer: Self-pay | Admitting: Occupational Therapy

## 2020-09-06 ENCOUNTER — Ambulatory Visit: Payer: Medicare Other | Admitting: Occupational Therapy

## 2020-09-06 ENCOUNTER — Ambulatory Visit: Payer: Medicare Other

## 2020-09-06 VITALS — BP 148/78

## 2020-09-06 DIAGNOSIS — R2689 Other abnormalities of gait and mobility: Secondary | ICD-10-CM | POA: Diagnosis not present

## 2020-09-06 DIAGNOSIS — I69354 Hemiplegia and hemiparesis following cerebral infarction affecting left non-dominant side: Secondary | ICD-10-CM

## 2020-09-06 DIAGNOSIS — R2681 Unsteadiness on feet: Secondary | ICD-10-CM

## 2020-09-06 DIAGNOSIS — M6281 Muscle weakness (generalized): Secondary | ICD-10-CM

## 2020-09-06 DIAGNOSIS — M79602 Pain in left arm: Secondary | ICD-10-CM

## 2020-09-06 DIAGNOSIS — M25622 Stiffness of left elbow, not elsewhere classified: Secondary | ICD-10-CM

## 2020-09-06 NOTE — Therapy (Signed)
[facility] [address] [place], [place], [address] Phone: [phone]   Fax:  [phone]  Occupational Therapy Treatment  Patient Details  Name: Christina Evans MRN: [id number] Date of Birth: January 29, 1961 Referring Provider (OT): Dr. Leonie Man   Encounter Date: 09/06/2020   OT End of Session - 09/06/20 1407    Visit Number 35    Number of Visits 40    Date for OT Re-Evaluation 09/20/20    Authorization Type UHC medicare and medicaid as secondary. Pt will need PN every 10th visit    Authorization Time Period renewed 06/27/20 for 5 week POC, cert for 90 days    Authorization - Visit Number 42    Authorization - Number of Visits 40   renewal was visit 31   Progress Note Due on Visit 67    OT Start Time 1405    OT Stop Time 1445    OT Time Calculation (min) 40 min    Activity Tolerance Patient tolerated treatment well    Behavior During Therapy WFL for tasks assessed/performed           Past Medical History:  Diagnosis Date  . Asthma   . CVA (cerebral vascular accident) ([place])   . Diabetes mellitus without complication [facility])     Past Surgical History:  Procedure Laterality Date  . IR ANGIO INTRA EXTRACRAN SEL COM CAROTID INNOMINATE BILAT MOD SED  09/15/2019  . IR ANGIO VERTEBRAL SEL VERTEBRAL UNI L MOD SED  09/15/2019    There were no vitals filed for this visit.   Subjective Assessment - 09/06/20 1405    Subjective  Pt reports having a headache every morning    Pertinent History Pt s/p recent R ACA CVA 09/13/19. 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    Currently in Pain? Yes    Pain Score 9     Pain Location Head    Pain Orientation Anterior    Pain Descriptors / Indicators Headache    Pain Type  Acute pain    Pain Onset More than a month ago    Pain Frequency Intermittent    Aggravating Factors  waking up too early    Pain Relieving Factors medicine                        OT Treatments/Exercises (OP) - 09/06/20 1416      Transfers   Sit to Stand 5: Supervision    Comments ambulation with cane with supervision this day to lobby      Neurological Re-education Exercises   Shoulder Flexion Self ROM;Left;10 reps;Seated    Weight Bearing Position Seated    Seated with weight on hand LUE hand on bumpy ball while reaching across body with large pegs    Development of Reach Ball stretches    Ball Stretches x 10       Manual Therapy   Manual Therapy Joint mobilization;Passive ROM;Manual Traction    Joint Mobilization wrist LUE    Passive ROM spasticity inhibition with LUE bicep    Manual Traction wrist LUE                     OT Short Term Goals - 06/27/20 6962  OT SHORT TERM GOAL #1   Title Pt and dtr will be mod  I with HEP to address ROM for RUE - 03/20/2020    Status Achieved      OT SHORT TERM GOAL #2   Title Pt and dtr will verbalize understanding for AE for cutting food on plate    Status Achieved      OT SHORT TERM GOAL #3   Title Pt will be mod I with washing face and set up for bushing teeth.    Status Achieved      OT SHORT TERM GOAL #4   Title Pt will be mod a for UB dressing    Status Achieved      OT SHORT TERM GOAL #5   Title Pt will be mod a for LB dressing    Status Achieved      OT SHORT TERM GOAL #6   Title Pt will require mod a to hike pants    Status Achieved      OT SHORT TERM GOAL #7   Title Pt and dtr will verbalize understanding of toileting schedule to address incontinence    Status Achieved      OT SHORT TERM GOAL #8   Title Pt and dtr will be mod with splint wear and care (resting hand splint at night)    Status Achieved      OT SHORT TERM GOAL  #9   TITLE Pt will demonstrate ability to set table and  wipe table (with dishes/silverware set on table) with supervision - 05/27/2020    Status Not Met   minguard in standing            OT Long Term Goals - 08/28/20 1608      OT LONG TERM GOAL #1   Title Pt and dtr will be mod I with upgraded home activities program to include focus ADL's, transitional movements and balance. -    Time 5    Period Weeks    Status On-going   needs reinforcement for carryover     OT LONG TERM GOAL #5   Status --   not appropriate at this time.     OT LONG TERM GOAL #6   Title Pt and dtr will complete FOTO outcome scale    Status On-going   will be completed at d/c     OT LONG TERM GOAL #7   Title Pt will participate in at least 1 home management task per day with no more than min A    Baseline currently not conistently assisting her dtr.    Time 5    Status On-going   helping with hanging and folding her clothes 08/21/20     OT LONG TERM GOAL #8   Title Pt will use her LUE as a stabilizer/ gross A 10% of the time for ADLs/ IADLs.    Baseline barely using LUE at all    Time 5    Period Weeks    Status On-going   encouraging patient to utilize LUE more.     OT LONG TERM GOAL  #9   Status --   not appropriate at this time                Plan - 09/06/20 1407    Clinical Impression Statement Pt demonstrates minimal carryover at home. Pt progressing towards goals.    OT Occupational Profile and History Detailed Assessment- Review of Records and additional review of physical,  cognitive, psychosocial history related to current functional performance    Occupational performance deficits (Please refer to evaluation for details): ADL's;IADL's;Work;Leisure;Social Participation    Body Structure / Function / Physical Skills ADL;Balance;Continence;Endurance;GMC;IADL;Mobility;Sensation;ROM;Pain;Strength;Tone;UE functional use    Rehab Potential Good    Clinical Decision Making Multiple treatment options, significant modification of task necessary     Comorbidities Affecting Occupational Performance: Presence of comorbidities impacting occupational performance    Comorbidities impacting occupational performance description: see above    Modification or Assistance to Complete Evaluation  Max significant modification of tasks or assist is necessary to complete    OT Frequency 2x / week    OT Duration --   5 weeks   OT Treatment/Interventions Self-care/ADL training;Aquatic Therapy;Electrical Stimulation;Ultrasound;Moist Heat;Therapeutic exercise;Neuromuscular education;Manual Therapy;Functional Mobility Training;DME and/or AE instruction;Passive range of motion;Patient/family education;Cognitive remediation/compensation;Therapeutic activities;Splinting;Balance training    Plan NMR LUE    Consulted and Agree with Plan of Care Patient;Family member/caregiver    Family Member Consulted dtr           Patient will benefit from skilled therapeutic intervention in order to improve the following deficits and impairments:   Body Structure / Function / Physical Skills: ADL, Balance, Continence, Endurance, GMC, IADL, Mobility, Sensation, ROM, Pain, Strength, Tone, UE functional use       Visit Diagnosis: Muscle weakness (generalized)  Spastic hemiplegia of left nondominant side as late effect of cerebral infarction (HCC)  Unsteadiness on feet  Stiffness of left upper arm joint  Other abnormalities of gait and mobility  Pain in left arm    Problem List Patient Active Problem List   Diagnosis Date Noted  . Cerebrovascular accident (CVA) due to thrombosis of precerebral artery ([place]) 11/30/2019  . 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 Spastic hemiparesis affecting nondominant side ([place]) 11/30/2019  . Chronic bilateral low back pain with sciatica 11/30/2019  . Metatarsal fracture 09/22/2019  . Depression 09/22/2019  . Asthma 09/22/2019  . Palliative care by specialist   . Goals of  care, counseling/discussion   . Encephalopathy   . Acute CVA (cerebrovascular accident) ([place]) 09/14/2019  . Acute arterial ischemic stroke, multifocal, anterior circulation, right ([place]) 09/14/2019  . Severe comorbid illness   . AMS (altered mental status) 09/13/2019  . Controlled diabetes mellitus type 2 with complications ([place]) 90/30/0923  . Hyperlipidemia 09/02/2017  . Hypertension 09/02/2017  . Chest pain 09/02/2017    Zachery Conch MOT, OTR/L  09/06/2020, 3:26 PM  [facility] [address] [place], [place], [address] Phone: [phone]   Fax:  [phone]  Name: Christina Evans MRN: [id number] Date of Birth: 24-Sep-1961

## 2020-09-06 NOTE — Therapy (Signed)
[facility] [address] [place], [place], [address] Phone: [phone]   Fax:  [phone]  Physical Therapy Treatment  Patient Details  Name: Christina Evans MRN: [id number] Date of Birth: 07-27-1961 Referring Provider (PT): Garvin Fila, MD   Encounter Date: 09/06/2020   PT End of Session - 09/06/20 1321    Visit Number 38    Number of Visits 41    Date for PT Re-Evaluation 10/20/20   written for 4 week POC   Authorization Type UHC Medicare    Progress Note Due on Visit 39   did progress note on visit 42   PT Start Time 1319    PT Stop Time 1400    PT Time Calculation (min) 41 min    Equipment Utilized During Treatment Gait belt    Activity Tolerance Patient tolerated treatment well    Behavior During Therapy WFL for tasks assessed/performed           Past Medical History:  Diagnosis Date  . Asthma   . CVA (cerebral vascular accident) ([place])   . Diabetes mellitus without complication [facility])     Past Surgical History:  Procedure Laterality Date  . IR ANGIO INTRA EXTRACRAN SEL COM CAROTID INNOMINATE BILAT MOD SED  09/15/2019  . IR ANGIO VERTEBRAL SEL VERTEBRAL UNI L MOD SED  09/15/2019    Vitals:   09/06/20 1326  BP: (!) 148/78     Subjective Assessment - 09/06/20 1324    Subjective Pt reports that she did see the doctor from [place] and was started on something for cough and an antibiotic. No falls or any other concerns. She reports throat is feeling better now and not coughing as much. Does continue to have headaches in the morning.    Pertinent History PMH significant for large right MCA CVA, HTN, T2DM, HLD, hx of Left foot fifth metatarsal fracture with osteopenia, asthma.    Limitations Standing;Walking    How long can you stand comfortably? 1 minute (when daughter is changing her when she is wearing diapers).    How long can you walk comfortably? a few feet.    Diagnostic  tests MRI brain with moderate sized acute to early subacute left ACA territory infarct andlarge chronic right MCA territory infarct.    Patient Stated Goals wants to get better, wants to be independent. work more on balance and stairs    Currently in Pain? No/denies                             [facility] Adult PT Treatment/Exercise - 09/06/20 1329      Transfers   Transfers Sit to Stand;Stand to Sit    Sit to Stand 5: Supervision    Stand to Sit 5: Supervision      Ambulation/Gait   Ambulation/Gait Yes    Ambulation/Gait Assistance 5: Supervision;4: Min guard    Ambulation/Gait Assistance Details PT provided tactile cues to try to facilitate more left anterior pelvic rotation with gait. Only needed CGA going up small incline on sidewalk catching left foot some.    Ambulation Distance (Feet) 500 Feet    Assistive device Straight cane   with quad tip, left AFO   Gait Pattern Step-through pattern;Decreased step length - right;Decreased step length - left;Decreased stance time - left;Decreased weight shift to left;Decreased hip/knee flexion - left    Ambulation Surface Level;Unlevel;Outdoor;Paved    Curb 4:  Min assist    Curb Details (indicate cue type and reason) Performed step-ups/down on 4" step with cane stepping up with LLE once and then RLE twice and back with each leg as well. Pt needed cuing for sequencing.       Neuro Re-ed    Neuro Re-ed Details  Gait over obstacle course with cane with quad tip stepping over 2 2"x4" black foam beams and then 360 degree turns around 2 cones. Repeated 4 times CGA. Pt had to circumduct some to clear LLE over beam.                   PT Education - 09/06/20 1432    Education Details Pt was notified that they can drop off shoe at [place] for them to add the shoe riser on.    Person(s) Educated Patient    Methods Explanation    Comprehension Need further instruction            PT Short Term Goals - 08/21/20 2134      PT  SHORT TERM GOAL #1   Title ALL STGS = LTGS             PT Long Term Goals - 09/06/20 1422      PT LONG TERM GOAL #1   Title Pt will increase gait speed from  >0.44ms for improved gait safety and mobility. ALL LTGS DUE 09/18/20    Baseline 37.12 sec with RW = .27 m/s with RW, 39.82 seconds = . 25 m/s with SPC with 3 prong tip. ON 08/21/20: .21 m/s with RW, .22 m/s with SPC w 3 prong tip    Time 4    Period Weeks    Status On-going      PT LONG TERM GOAL #2   Title Pt will perform 2 steps with SPC in LUE with min guard with step to pattern in order for pt to enter/exit daughter's home/    Time 4    Period Weeks    Status New      PT LONG TERM GOAL #3   Title Pt will ambulate at least 230' over level indoor/unlevel outdoor surfaces with LRAD with L AFO with min guard in order to improve functional mobility.    Baseline unable to fully assess today due to pt having incr L foot pain while wearing AFO, was ambulating with supervision/min guard with SPC with 3 prong tip. 09/06/20 500' with cane with quad tip outside on paved surfaces supervision/CGA    Time 4    Period Weeks    Status Achieved                 Plan - 09/06/20 1428    Clinical Impression Statement PT continued to focus on gait training on varied surfaces and with maneuvering around obstacles. Pt met gait goal for outside with cane at supervision/CGA level. PT continues to help some to try to facilitate more pelvic rotation. Pt still challenged with negotiating curb/step with cane needing min assist. Pt continues to benefit from skilled PT to continue to address strength, balance and gait deficits.    Personal Factors and Comorbidities Comorbidity 3+;Past/Current Experience;Time since onset of injury/illness/exacerbation;Behavior Pattern    Comorbidities PMH significant for large right MCA CVA, HTN, T2DM, HLD, hx of Left foot fifth metatarsal fracture with osteopenia, asthma.    Examination-Activity Limitations  Stand;Locomotion Level;Transfers;Toileting;Continence;Dressing    Examination-Participation Restrictions Community Activity    Stability/Clinical Decision Making Evolving/Moderate complexity  Rehab Potential Good    PT Frequency 2x / week    PT Duration 4 weeks    PT Treatment/Interventions ADLs/Self Care Home Management;Aquatic Therapy;Electrical Stimulation;DME Instruction;Gait training;Functional mobility training;Neuromuscular re-education;Balance training;Therapeutic exercise;Therapeutic activities;Patient/family education;Orthotic Fit/Training;Passive range of motion;Energy conservation    PT Next Visit Plan 10th visit progress note due next visit. Discharge most likely end of next week. Did they drop shoe off at [facility]? Contiue to work on step training. Personnel officer. continue with stepping activities with RLE(with weight shifting towards LLE), gait training with SPC with quad tip - with focus on facilitation for L weight shift and hip extension during terminal stance.    Consulted and Agree with Plan of Care Patient;Family member/caregiver           Patient will benefit from skilled therapeutic intervention in order to improve the following deficits and impairments:  Abnormal gait, Decreased activity tolerance, Decreased coordination, Decreased balance, Decreased cognition, Decreased endurance, Decreased knowledge of use of DME, Decreased range of motion, Decreased strength, Difficulty walking, Impaired tone, Impaired UE functional use, Postural dysfunction, Impaired sensation  Visit Diagnosis: Other abnormalities of gait and mobility  Muscle weakness (generalized)     Problem List Patient Active Problem List   Diagnosis Date Noted  . Cerebrovascular accident (CVA) due to thrombosis of precerebral artery ([place]) 11/30/2019  . 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 Spastic hemiparesis affecting nondominant side ([place]) 11/30/2019   . Chronic bilateral low back pain with sciatica 11/30/2019  . Metatarsal fracture 09/22/2019  . Depression 09/22/2019  . Asthma 09/22/2019  . Palliative care by specialist   . Goals of care, counseling/discussion   . Encephalopathy   . Acute CVA (cerebrovascular accident) ([place]) 09/14/2019  . Acute arterial ischemic stroke, multifocal, anterior circulation, right ([place]) 09/14/2019  . Severe comorbid illness   . AMS (altered mental status) 09/13/2019  . Controlled diabetes mellitus type 2 with complications ([place]) 24/46/9507  . Hyperlipidemia 09/02/2017  . Hypertension 09/02/2017  . Chest pain 09/02/2017    Electa Sniff, PT, DPT, NCS 09/06/2020, 2:38 PM  [facility] [address] [place], [place], [address] Phone: [phone]   Fax:  [phone]  Name: Mayumi Summerson MRN: [id number] Date of Birth: [birth year]

## 2020-09-11 ENCOUNTER — Other Ambulatory Visit: Payer: Self-pay

## 2020-09-11 ENCOUNTER — Ambulatory Visit: Payer: Medicare Other | Admitting: Occupational Therapy

## 2020-09-11 ENCOUNTER — Ambulatory Visit: Payer: Medicare Other

## 2020-09-11 VITALS — BP 132/78

## 2020-09-11 VITALS — BP 143/80 | HR 102

## 2020-09-11 DIAGNOSIS — R2689 Other abnormalities of gait and mobility: Secondary | ICD-10-CM | POA: Diagnosis not present

## 2020-09-11 DIAGNOSIS — M6281 Muscle weakness (generalized): Secondary | ICD-10-CM

## 2020-09-11 DIAGNOSIS — M79602 Pain in left arm: Secondary | ICD-10-CM

## 2020-09-11 DIAGNOSIS — R2681 Unsteadiness on feet: Secondary | ICD-10-CM

## 2020-09-11 DIAGNOSIS — M25622 Stiffness of left elbow, not elsewhere classified: Secondary | ICD-10-CM

## 2020-09-11 DIAGNOSIS — I69354 Hemiplegia and hemiparesis following cerebral infarction affecting left non-dominant side: Secondary | ICD-10-CM

## 2020-09-11 NOTE — Therapy (Signed)
[facility] [address] [place], [place], [address] Phone: [phone]   Fax:  [phone]  Physical Therapy Treatment/10th visit progress note  Patient Details  Name: Christina Evans MRN: [id number] Date of Birth: 10-04-1961 Referring Provider (PT): Micki Riley, MD    Progress Note  Reporting period 07/04/20 to 09/11/20  See Note below for Objective Data and Assessment of Progress/Goals  Encounter Date: 09/11/2020   PT End of Session - 09/11/20 1405    Visit Number 39    Number of Visits 41    Date for PT Re-Evaluation 10/20/20   written for 4 week POC   Authorization Type [employer] Medicare    Progress Note Due on Visit 39   did progress note on visit 19   PT Start Time 1402    PT Stop Time 1443    PT Time Calculation (min) 41 min    Equipment Utilized During Treatment Gait belt    Activity Tolerance Patient tolerated treatment well    Behavior During Therapy WFL for tasks assessed/performed           Past Medical History:  Diagnosis Date   Asthma    CVA (cerebral vascular accident) (HCC)    Diabetes mellitus without complication (HCC)     Past Surgical History:  Procedure Laterality Date   IR ANGIO INTRA EXTRACRAN SEL COM CAROTID INNOMINATE BILAT MOD SED  09/15/2019   IR ANGIO VERTEBRAL SEL VERTEBRAL UNI L MOD SED  09/15/2019    Vitals:   09/11/20 1406  BP: 132/78     Subjective Assessment - 09/11/20 1406    Subjective Pt reports that she has been doing well. Walking going good at home. Has not had any falls. Has not gotten lift on shoe yet.    Pertinent History PMH significant for large right MCA CVA, HTN, T2DM, HLD, hx of Left foot fifth metatarsal fracture with osteopenia, asthma.    Limitations Standing;Walking    How long can you stand comfortably? 1 minute (when daughter is changing her when she is wearing diapers).    How long can you walk comfortably? a few feet.    Diagnostic  tests MRI brain with moderate sized acute to early subacute left ACA territory infarct andlarge chronic right MCA territory infarct.    Patient Stated Goals wants to get better, wants to be independent. work more on balance and stairs    Currently in Pain? No/denies                             [facility] Adult PT Treatment/Exercise - 09/11/20 1409      Transfers   Transfers Sit to Stand;Stand to Sit    Sit to Stand 5: Supervision    Stand to Sit 5: Supervision      Ambulation/Gait   Ambulation/Gait Yes    Ambulation/Gait Assistance 5: Supervision;4: Min guard    Ambulation/Gait Assistance Details Pt reported some pain in left calf towards and of gait and feeling SOB. HR=116 after gait. Able to carry on conversation throughout.    Ambulation Distance (Feet) 500 Feet    Assistive device Straight cane    Gait Pattern Step-through pattern;Decreased stance time - left;Decreased hip/knee flexion - left    Ambulation Surface Level;Unlevel;Indoor;Outdoor;Paved      Neuro Re-ed    Neuro Re-ed Details  Pt performed step-ups on 4" step x 5 leading up with RLE and back  with RLE using cane for stability. Standing stepping back with LLE x 10 for increased hip extension. Tapping 2" foam beam with LLE x 10 and bringing back off CGA. In // bars: holding right railing standing on rockerboard trying to maintain level x 30 sec then holding PT hand overhead with RUE to lengthen right side, then rocking side to side x 10. CGA and PT helping to facilitate left weight shift.                   PT Education - 09/11/20 2106    Education Details Discussed discharge plan for next visit.    Person(s) Educated Patient    Methods Explanation    Comprehension Verbalized understanding            PT Short Term Goals - 08/21/20 2134      PT SHORT TERM GOAL #1   Title ALL STGS = LTGS             PT Long Term Goals - 09/06/20 1422      PT LONG TERM GOAL #1   Title Pt will increase  gait speed from  >0.47m/s for improved gait safety and mobility. ALL LTGS DUE 09/18/20    Baseline 37.12 sec with RW = .27 m/s with RW, 39.82 seconds = . 25 m/s with SPC with 3 prong tip. ON 08/21/20: .21 m/s with RW, .22 m/s with SPC w 3 prong tip    Time 4    Period Weeks    Status On-going      PT LONG TERM GOAL #2   Title Pt will perform 2 steps with SPC in LUE with min guard with step to pattern in order for pt to enter/exit daughter's home/    Time 4    Period Weeks    Status New      PT LONG TERM GOAL #3   Title Pt will ambulate at least 230' over level indoor/unlevel outdoor surfaces with LRAD with L AFO with min guard in order to improve functional mobility.    Baseline unable to fully assess today due to pt having incr L foot pain while wearing AFO, was ambulating with supervision/min guard with SPC with 3 prong tip. 09/06/20 500' with cane with quad tip outside on paved surfaces supervision/CGA    Time 4    Period Weeks    Status Achieved                 Plan - 09/11/20 2106    Clinical Impression Statement Pt was able to increase gait distance outside with cane. She continues to show improved stability with gait with less assistance. PT continued to focus on activities to promote increased left weight shift and step training activities. Pt continues to benefit from skilled PT to address strength, balance and functional mobility deficits. Do plan to discharge next visit.    Personal Factors and Comorbidities Comorbidity 3+;Past/Current Experience;Time since onset of injury/illness/exacerbation;Behavior Pattern    Comorbidities PMH significant for large right MCA CVA, HTN, T2DM, HLD, hx of Left foot fifth metatarsal fracture with osteopenia, asthma.    Examination-Activity Limitations Stand;Locomotion Level;Transfers;Toileting;Continence;Dressing    Examination-Participation Restrictions Community Activity    Stability/Clinical Decision Making Evolving/Moderate complexity     Rehab Potential Good    PT Frequency 2x / week    PT Duration 4 weeks    PT Treatment/Interventions ADLs/Self Care Home Management;Aquatic Therapy;Electrical Stimulation;DME Instruction;Gait training;Functional mobility training;Neuromuscular re-education;Balance training;Therapeutic exercise;Therapeutic activities;Patient/family education;Orthotic Fit/Training;Passive  range of motion;Energy conservation    PT Next Visit Plan Discharge next visit as long as no changes. Did they drop shoe off at [facility]? Contiue to work on step training. Investment banker, operational. continue with stepping activities with RLE(with weight shifting towards LLE), gait training with SPC with quad tip - with focus on facilitation for L weight shift and hip extension during terminal stance.    Consulted and Agree with Plan of Care Patient;Family member/caregiver           Patient will benefit from skilled therapeutic intervention in order to improve the following deficits and impairments:  Abnormal gait, Decreased activity tolerance, Decreased coordination, Decreased balance, Decreased cognition, Decreased endurance, Decreased knowledge of use of DME, Decreased range of motion, Decreased strength, Difficulty walking, Impaired tone, Impaired UE functional use, Postural dysfunction, Impaired sensation  Visit Diagnosis: Other abnormalities of gait and mobility  Muscle weakness (generalized)     Problem List Patient Active Problem List   Diagnosis Date Noted   Cerebrovascular accident (CVA) due to thrombosis of precerebral artery (HCC) 11/30/2019   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 11/30/2019   Spastic hemiparesis affecting nondominant side (HCC) 11/30/2019   Chronic bilateral low back pain with sciatica 11/30/2019   Metatarsal fracture 09/22/2019   Depression 09/22/2019   Asthma 09/22/2019   Palliative care by specialist    Goals of care, counseling/discussion     Encephalopathy    Acute CVA (cerebrovascular accident) (HCC) 09/14/2019   Acute arterial ischemic stroke, multifocal, anterior circulation, right (HCC) 09/14/2019   Severe comorbid illness    AMS (altered mental status) 09/13/2019   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   Hyperlipidemia 09/02/2017   Hypertension 09/02/2017   Chest pain 09/02/2017    Ronn Melena, PT, DPT, NCS 09/11/2020, 9:10 PM  [facility] [address] [place], [place], [address] Phone: [phone]   Fax:  [phone]  Name: Nakya Weyand MRN: [id number] Date of Birth: 29-Aug-1961

## 2020-09-11 NOTE — Therapy (Addendum)
[facility] [address] [place], [place], [address] Phone: [phone]   Fax:  [phone]  Occupational Therapy Treatment  Patient Details  Name: Christina Evans MRN: [id number] Date of Birth: 08/09/1961 Referring Provider (OT): Dr. Leonie Man   Encounter Date: 09/11/2020   OT End of Session - 09/11/20 1321    Visit Number 36    Number of Visits 40    Date for OT Re-Evaluation 09/20/20    Authorization Type U[employer] medicare and medicaid as secondary. Pt will need PN every 10th visit    Authorization Time Period renewed 06/27/20 for 5 week POC, cert for 90 days    Authorization - Visit Number 28    Authorization - Number of Visits 40   renewal was visit 31   Progress Note Due on Visit 65    OT Start Time 1319    OT Stop Time 1400    OT Time Calculation (min) 41 min    Activity Tolerance Patient tolerated treatment well    Behavior During Therapy WFL for tasks assessed/performed           Past Medical History:  Diagnosis Date  . Asthma   . CVA (cerebral vascular accident) ([place])   . Diabetes mellitus without complication [facility])     Past Surgical History:  Procedure Laterality Date  . IR ANGIO INTRA EXTRACRAN SEL COM CAROTID INNOMINATE BILAT MOD SED  09/15/2019  . IR ANGIO VERTEBRAL SEL VERTEBRAL UNI L MOD SED  09/15/2019    Vitals:   09/11/20 1323  BP: (!) 143/80  Pulse: (!) 102     Subjective Assessment - 09/11/20 1323    Subjective  Pt reports having a headache every morning    Pertinent History Pt s/p recent R ACA CVA 09/13/19. 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    Currently in Pain? Yes    Pain Score 9     Pain Location Head    Pain Descriptors / Indicators Headache    Pain Type Acute pain     Pain Onset More than a month ago    Pain Frequency Constant                        OT Treatments/Exercises (OP) - 09/11/20 0001      Neurological Re-education Exercises   Shoulder Flexion PROM;Left;Seated    Elbow Extension PROM;Left;Seated   limited by spasticity and pain   Other Exercises 1 Hemi Glide with LUE. Pt with max A for maintaining positioning and hold but facilitated with ROM in different planes with LUE    Weight Bearing Position Seated    Seated with weight on hand LUE on mat and reaching with RUE                    OT Short Term Goals - 09/11/20 1322      OT SHORT TERM GOAL #1   Title Pt and dtr will be mod  I with HEP to address ROM for RUE - 03/20/2020    Status Achieved      OT SHORT TERM GOAL #2   Title Pt and dtr will verbalize understanding for AE for cutting food on plate    Status Achieved  OT SHORT TERM GOAL #3   Title Pt will be mod I with washing face and set up for bushing teeth.    Status Achieved      OT SHORT TERM GOAL #4   Title Pt will be mod a for UB dressing    Status Achieved      OT SHORT TERM GOAL #5   Title Pt will be mod a for LB dressing    Status Achieved      OT SHORT TERM GOAL #6   Title Pt will require mod a to hike pants    Status Achieved      OT SHORT TERM GOAL #7   Title Pt and dtr will verbalize understanding of toileting schedule to address incontinence    Status Achieved      OT SHORT TERM GOAL #8   Title Pt and dtr will be mod with splint wear and care (resting hand splint at night)    Status Achieved      OT SHORT TERM GOAL  #9   TITLE Pt will demonstrate ability to set table and wipe table (with dishes/silverware set on table) with supervision - 05/27/2020    Status Not Met   minguard in standing            OT Long Term Goals - 09/11/20 1323      OT LONG TERM GOAL #1   Title Pt and dtr will be mod I with upgraded home activities program to include focus ADL's, transitional  movements and balance. -    Time 5    Period Weeks    Status On-going   needs reinforcement for carryover     OT LONG TERM GOAL #5   Status --   not appropriate at this time.     OT LONG TERM GOAL #6   Title Pt and dtr will complete FOTO outcome scale    Status On-going   will be completed at d/c     OT LONG TERM GOAL #7   Title Pt will participate in at least 1 home management task per day with no more than min A    Baseline currently not conistently assisting her dtr.    Time 5    Status Achieved   helping with hanging and folding her clothes 08/21/20     OT LONG TERM GOAL #8   Title Pt will use her LUE as a stabilizer/ gross A 10% of the time for ADLs/ IADLs.    Baseline barely using LUE at all    Time 5    Period Weeks    Status On-going   encouraging patient to utilize LUE more.     OT LONG TERM GOAL  #9   Status --   not appropriate at this time                Plan - 09/11/20 1321    Clinical Impression Statement Pt limited by pain and spasticity in LUE with PROM this day - increase in elbow extension.    OT Occupational Profile and History Detailed Assessment- Review of Records and additional review of physical, cognitive, psychosocial history related to current functional performance    Occupational performance deficits (Please refer to evaluation for details): ADL's;IADL's;Work;Leisure;Social Participation    Body Structure / Function / Physical Skills ADL;Balance;Continence;Endurance;GMC;IADL;Mobility;Sensation;ROM;Pain;Strength;Tone;UE functional use    Rehab Potential Good    Clinical Decision Making Multiple treatment options, significant modification of task necessary    Comorbidities  Affecting Occupational Performance: Presence of comorbidities impacting occupational performance    Comorbidities impacting occupational performance description: see above    Modification or Assistance to Complete Evaluation  Max significant modification of tasks or assist is  necessary to complete    OT Frequency 2x / week    OT Duration --   5 weeks   OT Treatment/Interventions Self-care/ADL training;Aquatic Therapy;Electrical Stimulation;Ultrasound;Moist Heat;Therapeutic exercise;Neuromuscular education;Manual Therapy;Functional Mobility Training;DME and/or AE instruction;Passive range of motion;Patient/family education;Cognitive remediation/compensation;Therapeutic activities;Splinting;Balance training    Plan NMR LUE    Consulted and Agree with Plan of Care Patient;Family member/caregiver    Family Member Consulted dtr           Patient will benefit from skilled therapeutic intervention in order to improve the following deficits and impairments:   Body Structure / Function / Physical Skills: ADL, Balance, Continence, Endurance, GMC, IADL, Mobility, Sensation, ROM, Pain, Strength, Tone, UE functional use       Visit Diagnosis: Muscle weakness (generalized)  Spastic hemiplegia of left nondominant side as late effect of cerebral infarction (HCC)  Unsteadiness on feet  Stiffness of left upper arm joint  Pain in left arm    Problem List Patient Active Problem List   Diagnosis Date Noted  . Cerebrovascular accident (CVA) due to thrombosis of precerebral artery ([place]) 11/30/2019  . 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 Spastic hemiparesis affecting nondominant side ([place]) 11/30/2019  . Chronic bilateral low back pain with sciatica 11/30/2019  . Metatarsal fracture 09/22/2019  . Depression 09/22/2019  . Asthma 09/22/2019  . Palliative care by specialist   . Goals of care, counseling/discussion   . Encephalopathy   . Acute CVA (cerebrovascular accident) ([place]) 09/14/2019  . Acute arterial ischemic stroke, multifocal, anterior circulation, right ([place]) 09/14/2019  . Severe comorbid illness   . AMS (altered mental status) 09/13/2019  . Controlled diabetes mellitus type 2 with complications ([place])  28/76/8115  . Hyperlipidemia 09/02/2017  . Hypertension 09/02/2017  . Chest pain 09/02/2017    Zachery Conch MOT, OTR/L 09/11/2020, 2:15 PM  [facility] [address] [place], [place], [address] Phone: [phone]   Fax:  [phone]  Name: Christina Evans MRN: [id number] Date of Birth: October 27, 1961

## 2020-09-11 NOTE — Therapy (Signed)
[facility] [address] [place], [place], [address] Phone: [phone]   Fax:  [phone]  Occupational Therapy Treatment  Patient Details  Name: Christina Evans MRN: [id number] Date of Birth: 08/09/1961 Referring Provider (OT): Dr. Leonie Man   Encounter Date: 09/11/2020   OT End of Session - 09/11/20 1321    Visit Number 36    Number of Visits 40    Date for OT Re-Evaluation 09/20/20    Authorization Type [employer] medicare and medicaid as secondary. Pt will need PN every 10th visit    Authorization Time Period renewed 06/27/20 for 5 week POC, cert for 90 days    Authorization - Visit Number 28    Authorization - Number of Visits 40   renewal was visit 31   Progress Note Due on Visit 65    OT Start Time 1319    OT Stop Time 1400    OT Time Calculation (min) 41 min    Activity Tolerance Patient tolerated treatment well    Behavior During Therapy WFL for tasks assessed/performed           Past Medical History:  Diagnosis Date  . Asthma   . CVA (cerebral vascular accident) ([place])   . Diabetes mellitus without complication [facility])     Past Surgical History:  Procedure Laterality Date  . IR ANGIO INTRA EXTRACRAN SEL COM CAROTID INNOMINATE BILAT MOD SED  09/15/2019  . IR ANGIO VERTEBRAL SEL VERTEBRAL UNI L MOD SED  09/15/2019    Vitals:   09/11/20 1323  BP: (!) 143/80  Pulse: (!) 102     Subjective Assessment - 09/11/20 1323    Subjective  Pt reports having a headache every morning    Pertinent History Pt s/p recent R ACA CVA 09/13/19. 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    Currently in Pain? Yes    Pain Score 9     Pain Location Head    Pain Descriptors / Indicators Headache    Pain Type Acute pain     Pain Onset More than a month ago    Pain Frequency Constant                        OT Treatments/Exercises (OP) - 09/11/20 0001      Neurological Re-education Exercises   Shoulder Flexion PROM;Left;Seated    Elbow Extension PROM;Left;Seated   limited by spasticity and pain   Other Exercises 1 Hemi Glide with LUE. Pt with max A for maintaining positioning and hold but facilitated with ROM in different planes with LUE    Weight Bearing Position Seated    Seated with weight on hand LUE on mat and reaching with RUE                    OT Short Term Goals - 09/11/20 1322      OT SHORT TERM GOAL #1   Title Pt and dtr will be mod  I with HEP to address ROM for RUE - 03/20/2020    Status Achieved      OT SHORT TERM GOAL #2   Title Pt and dtr will verbalize understanding for AE for cutting food on plate    Status Achieved  OT SHORT TERM GOAL #3   Title Pt will be mod I with washing face and set up for bushing teeth.    Status Achieved      OT SHORT TERM GOAL #4   Title Pt will be mod a for UB dressing    Status Achieved      OT SHORT TERM GOAL #5   Title Pt will be mod a for LB dressing    Status Achieved      OT SHORT TERM GOAL #6   Title Pt will require mod a to hike pants    Status Achieved      OT SHORT TERM GOAL #7   Title Pt and dtr will verbalize understanding of toileting schedule to address incontinence    Status Achieved      OT SHORT TERM GOAL #8   Title Pt and dtr will be mod with splint wear and care (resting hand splint at night)    Status Achieved      OT SHORT TERM GOAL  #9   TITLE Pt will demonstrate ability to set table and wipe table (with dishes/silverware set on table) with supervision - 05/27/2020    Status Not Met   minguard in standing            OT Long Term Goals - 09/11/20 1323      OT LONG TERM GOAL #1   Title Pt and dtr will be mod I with upgraded home activities program to include focus ADL's, transitional  movements and balance. -    Time 5    Period Weeks    Status On-going   needs reinforcement for carryover     OT LONG TERM GOAL #5   Status --   not appropriate at this time.     OT LONG TERM GOAL #6   Title Pt and dtr will complete FOTO outcome scale    Status On-going   will be completed at d/c     OT LONG TERM GOAL #7   Title Pt will participate in at least 1 home management task per day with no more than min A    Baseline currently not conistently assisting her dtr.    Time 5    Status Achieved   helping with hanging and folding her clothes 08/21/20     OT LONG TERM GOAL #8   Title Pt will use her LUE as a stabilizer/ gross A 10% of the time for ADLs/ IADLs.    Baseline barely using LUE at all    Time 5    Period Weeks    Status On-going   encouraging patient to utilize LUE more.     OT LONG TERM GOAL  #9   Status --   not appropriate at this time                Plan - 09/11/20 1321    Clinical Impression Statement Pt limited by pain and spasticity in LUE with PROM this day - increase in elbow extension.    OT Occupational Profile and History Detailed Assessment- Review of Records and additional review of physical, cognitive, psychosocial history related to current functional performance    Occupational performance deficits (Please refer to evaluation for details): ADL's;IADL's;Work;Leisure;Social Participation    Body Structure / Function / Physical Skills ADL;Balance;Continence;Endurance;GMC;IADL;Mobility;Sensation;ROM;Pain;Strength;Tone;UE functional use    Rehab Potential Good    Clinical Decision Making Multiple treatment options, significant modification of task necessary    Comorbidities  Affecting Occupational Performance: Presence of comorbidities impacting occupational performance    Comorbidities impacting occupational performance description: see above    Modification or Assistance to Complete Evaluation  Max significant modification of tasks or assist is  necessary to complete    OT Frequency 2x / week    OT Duration --   5 weeks   OT Treatment/Interventions Self-care/ADL training;Aquatic Therapy;Electrical Stimulation;Ultrasound;Moist Heat;Therapeutic exercise;Neuromuscular education;Manual Therapy;Functional Mobility Training;DME and/or AE instruction;Passive range of motion;Patient/family education;Cognitive remediation/compensation;Therapeutic activities;Splinting;Balance training    Plan NMR LUE    Consulted and Agree with Plan of Care Patient;Family member/caregiver    Family Member Consulted dtr           Patient will benefit from skilled therapeutic intervention in order to improve the following deficits and impairments:   Body Structure / Function / Physical Skills: ADL, Balance, Continence, Endurance, GMC, IADL, Mobility, Sensation, ROM, Pain, Strength, Tone, UE functional use       Visit Diagnosis: No diagnosis found.    Problem List Patient Active Problem List   Diagnosis Date Noted  . Cerebrovascular accident (CVA) due to thrombosis of precerebral artery ([place]) 11/30/2019  . 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 Spastic hemiparesis affecting nondominant side ([place]) 11/30/2019  . Chronic bilateral low back pain with sciatica 11/30/2019  . Metatarsal fracture 09/22/2019  . Depression 09/22/2019  . Asthma 09/22/2019  . Palliative care by specialist   . Goals of care, counseling/discussion   . Encephalopathy   . Acute CVA (cerebrovascular accident) ([place]) 09/14/2019  . Acute arterial ischemic stroke, multifocal, anterior circulation, right ([place]) 09/14/2019  . Severe comorbid illness   . AMS (altered mental status) 09/13/2019  . Controlled diabetes mellitus type 2 with complications ([place]) 52/48/1859  . Hyperlipidemia 09/02/2017  . Hypertension 09/02/2017  . Chest pain 09/02/2017    Zachery Conch MOT, OTR/L  09/11/2020, 2:13 PM  [facility] [address] [place], [place], [address] Phone: [phone]   Fax:  [phone]  Name: Christina Evans MRN: [id number] Date of Birth: 17-Apr-1961

## 2020-09-12 ENCOUNTER — Other Ambulatory Visit: Payer: Self-pay

## 2020-09-12 ENCOUNTER — Emergency Department (HOSPITAL_COMMUNITY)
Admission: EM | Admit: 2020-09-12 | Discharge: 2020-09-12 | Disposition: A | Discharge status: Home / Self Care | Payer: Medicare Other | Attending: Emergency Medicine | Admitting: Emergency Medicine

## 2020-09-12 ENCOUNTER — Encounter (HOSPITAL_COMMUNITY): Payer: Self-pay | Admitting: Emergency Medicine

## 2020-09-12 ENCOUNTER — Emergency Department (HOSPITAL_COMMUNITY): Payer: Medicare Other

## 2020-09-12 DIAGNOSIS — E119 Type 2 diabetes mellitus without complications: Secondary | ICD-10-CM | POA: Diagnosis not present

## 2020-09-12 DIAGNOSIS — M7752 Other enthesopathy of left foot: Secondary | ICD-10-CM

## 2020-09-12 DIAGNOSIS — J45909 Unspecified asthma, uncomplicated: Secondary | ICD-10-CM | POA: Insufficient documentation

## 2020-09-12 DIAGNOSIS — Z794 Long term (current) use of insulin: Secondary | ICD-10-CM | POA: Insufficient documentation

## 2020-09-12 DIAGNOSIS — I10 Essential (primary) hypertension: Secondary | ICD-10-CM | POA: Insufficient documentation

## 2020-09-12 DIAGNOSIS — Z7982 Long term (current) use of aspirin: Secondary | ICD-10-CM | POA: Diagnosis not present

## 2020-09-12 DIAGNOSIS — R52 Pain, unspecified: Secondary | ICD-10-CM

## 2020-09-12 DIAGNOSIS — M71572 Other bursitis, not elsewhere classified, left ankle and foot: Secondary | ICD-10-CM | POA: Diagnosis not present

## 2020-09-12 DIAGNOSIS — M79672 Pain in left foot: Secondary | ICD-10-CM | POA: Diagnosis present

## 2020-09-12 DIAGNOSIS — Z87891 Personal history of nicotine dependence: Secondary | ICD-10-CM | POA: Diagnosis not present

## 2020-09-12 DIAGNOSIS — Z7984 Long term (current) use of oral hypoglycemic drugs: Secondary | ICD-10-CM | POA: Diagnosis not present

## 2020-09-12 MED ORDER — NAPROXEN 250 MG PO TABS
500.0000 mg | ORAL_TABLET | Freq: Once | ORAL | Status: AC
  Administered 2020-09-12: 500 mg via ORAL
  Filled 2020-09-12: qty 2

## 2020-09-12 NOTE — ED Provider Notes (Signed)
[facility]ERGENCY DEPARTMENT Provider Note   CSN: [id number] Arrival date & time: 09/12/20  1327     History Chief Complaint  Patient presents with  . Foot Pain    Christina Evans is a 59 y.o. female.  HPI    Patient presents with concern of pain in her left heel.  Onset was yesterday.  Since onset pain has been persistent, worse with ambulation, weightbearing.  Minimal change with ibuprofen.  The patient has a notable history of prior CVA, just started physical therapy, walking again yesterday.  No fever, chills, nausea, vomiting or any other complaints.  Patient speaks [place] and [place] - (she was offered option of either for the eval)  Past Medical History:  Diagnosis Date  . Asthma   . CVA (cerebral vascular accident) ([place])   . Diabetes mellitus without complication [facility])     Patient Active Problem List   Diagnosis Date Noted  . Cerebrovascular accident (CVA) due to thrombosis of precerebral artery ([place]) 11/30/2019  . 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 Spastic hemiparesis affecting nondominant side ([place]) 11/30/2019  . Chronic bilateral low back pain with sciatica 11/30/2019  . Metatarsal fracture 09/22/2019  . Depression 09/22/2019  . Asthma 09/22/2019  . Palliative care by specialist   . Goals of care, counseling/discussion   . Encephalopathy   . Acute CVA (cerebrovascular accident) ([place]) 09/14/2019  . Acute arterial ischemic stroke, multifocal, anterior circulation, right ([place]) 09/14/2019  . Severe comorbid illness   . AMS (altered mental status) 09/13/2019  . Controlled diabetes mellitus type 2 with complications ([place]) 22/48/2500  . Hyperlipidemia 09/02/2017  . Hypertension 09/02/2017  . Chest pain 09/02/2017    Past Surgical History:  Procedure Laterality Date  . IR ANGIO INTRA EXTRACRAN SEL COM CAROTID INNOMINATE BILAT MOD SED  09/15/2019  . IR ANGIO VERTEBRAL SEL  VERTEBRAL UNI L MOD SED  09/15/2019     OB History   No obstetric history on file.     Family History  Problem Relation Age of Onset  . Hypertension Mother   . Hypertension Father     Social History   Tobacco Use  . Smoking status: Former Research scientist (life sciences)  . Smokeless tobacco: Never Used  . Tobacco comment: QUIT IN 2008  Vaping Use  . Vaping Use: Never used  Substance Use Topics  . Alcohol use: Yes    Comment: RARE  . Drug use: No    Home Medications Prior to Admission medications   Medication Sig Start Date End Date Taking? Authorizing Provider  acetaminophen (TYLENOL) 650 MG CR tablet Take 650 mg by mouth every 6 (six) hours as needed for pain.    [provider]  albuterol (PROAIR HFA) 108 (90 Base) MCG/ACT inhaler Inhale 2 puffs into the lungs 4 (four) times daily as needed for wheezing or shortness of breath. 10/06/19   Hennie Duos, MD  aspirin 325 MG EC tablet Take 1 tablet (325 mg total) by mouth daily. 09/19/19   Matilde Haymaker, MD  atorvastatin (LIPITOR) 80 MG tablet Take 1 tablet (80 mg total) by mouth daily at 6 PM. 10/06/19   Hennie Duos, MD  blood glucose meter kit and supplies Dispense based on patient and insurance preference. Use up to four times daily as directed. (FOR ICD-9 250.00, 250.01). 09/02/17   Reino Bellis B, NP  botulinum toxin Type A (BOTOX) 100 units SOLR injection Inject 600 Units into the  muscle every 3 (three) months. 03/27/20   Marcial Pacas, MD  citalopram (CELEXA) 40 MG tablet Take 1 tablet (40 mg total) by mouth daily. 10/06/19   Hennie Duos, MD  clopidogrel (PLAVIX) 75 MG tablet Take 1 tablet (75 mg total) by mouth daily. 10/06/19   Hennie Duos, MD  glucose blood test strip Use as instructed 10/15/19   Kerin Perna, NP  IncobotulinumtoxinA (XEOMIN IM) Inject into the muscle every 3 (three) months.    [provider]  insulin aspart (NOVOLOG FLEXPEN) 100 UNIT/ML FlexPen Inject 15 Units into the skin 3  (three) times daily with meals. 10/06/19   Hennie Duos, MD  Insulin Glargine (LANTUS SOLOSTAR) 100 UNIT/ML Solostar Pen Inject 33 Units into the skin daily. Patient taking differently: Inject 64 Units into the skin daily.  10/06/19   Hennie Duos, MD  insulin lispro (HUMALOG) 100 UNIT/ML injection Inject 0.15 mLs (15 Units total) into the skin 3 (three) times daily before meals. 10/20/19   Charlesetta Shanks, MD  Lancets (ACCU-CHEK SOFT TOUCH) lancets Use as instructed 10/15/19   Kerin Perna, NP  metFORMIN (GLUCOPHAGE-XR) 500 MG 24 hr tablet Take 2 tablets (1,000 mg total) by mouth 2 (two) times daily. 10/06/19   Hennie Duos, MD  methylphenidate (RITALIN) 10 MG tablet Take 1 tablet (10 mg total) by mouth in the morning. 06/18/20   Kirsteins, Luanna Salk, MD  omega-3 acid ethyl esters (LOVAZA) 1 g capsule Take 1 capsule (1 g total) by mouth 2 (two) times daily. 10/06/19   Hennie Duos, MD  tiZANidine (ZANAFLEX) 4 MG tablet TAKE 1 TABLET (4 MG TOTAL) BY MOUTH AT BEDTIME. 08/20/20   Kirsteins, Luanna Salk, MD    Allergies    Tape  Review of Systems   Review of Systems  Constitutional:       Per HPI, otherwise negative  HENT:       Per HPI, otherwise negative  Respiratory:       Per HPI, otherwise negative  Cardiovascular:       Per HPI, otherwise negative  Gastrointestinal: Negative for vomiting.  Endocrine:       Negative aside from HPI  Genitourinary:       Neg aside from HPI   Musculoskeletal:       Per HPI, otherwise negative  Skin: Negative.   Neurological: Positive for weakness. Negative for syncope.    Physical Exam Updated Vital Signs BP 133/65   Pulse 91   Temp (!) 97.5 F (36.4 C) (Oral)   Resp 18   Ht _0  (1.549 m)   Wt 74.4 kg   SpO2 98%   BMI 30.99 kg/m   Physical Exam Vitals and nursing note reviewed.  Constitutional:      General: She is not in acute distress.    Appearance: She is well-developed.  HENT:     Head: Normocephalic  and atraumatic.  Eyes:     Conjunctiva/sclera: Conjunctivae normal.  Cardiovascular:     Rate and Rhythm: Normal rate and regular rhythm.     Pulses: Normal pulses.  Pulmonary:     Effort: Pulmonary effort is normal. No respiratory distress.     Breath sounds: No stridor.  Abdominal:     General: There is no distension.  Musculoskeletal:       Legs:  Skin:    General: Skin is warm and dry.  Neurological:     Mental Status: She is alert and oriented  to person, place, and time.     Cranial Nerves: No cranial nerve deficit.     ED Results / Procedures / Treatments   Labs (all labs ordered are listed, but only abnormal results are displayed) Labs Reviewed - No data to display  EKG None  Radiology DG Foot Complete Left  Result Date: 09/12/2020 CLINICAL DATA:  Left foot pain.  No known injury EXAM: LEFT FOOT - COMPLETE 3+ VIEW COMPARISON:  09/13/2019 FINDINGS: Bones are demineralized. Previously seen fifth metatarsal base fracture has healed. No evidence of new or acute fracture. No dislocation. Mild first MTP joint space narrowing. Small plantar calcaneal spur. Soft tissues unremarkable. IMPRESSION: 1. No acute osseous abnormality, left foot. 2. Mild first MTP joint space narrowing. Electronically Signed   By: Davina Poke D.O.   On: 09/12/2020 14:24    Procedures Procedures (including critical care time)  Medications Ordered in ED Medications  naproxen (NAPROSYN) tablet 500 mg (500 mg Oral Given 09/12/20 1941)    ED Course  I have reviewed the triage vital signs and the nursing notes.  Pertinent labs & imaging results that were available during my care of the patient were reviewed by me and considered in my medical decision making (see chart for details).  I reviewed the patient's x-ray, demonstrated picture to her.  Patient is in no distress, is awake, alert.  We discussed likelihood of bursitis contributing to her pain is she has focal tenderness on her heel, is  worse with weightbearing and ambulation, otherwise minimal and she has no other complaints.  No evidence for bacteremia, sepsis, low suspicion for cellulitis given the absence of fever, warmth, pain throughout.  Patient appropriate for discharge with ongoing anti-inflammatories, cryotherapy, outpatient follow-up.  Final Clinical Impression(s) / ED Diagnoses Final diagnoses:  Pain  Left calcaneal bursitis    Rx / DC Orders ED Discharge Orders    None       Carmin Muskrat, MD 09/12/20 2020

## 2020-09-12 NOTE — ED Notes (Signed)
Pt has 2+ left pedal pulse, cap refill less than 3 sec, foot is erythematous, sensation intact.

## 2020-09-12 NOTE — ED Triage Notes (Signed)
Pt c/o left foot pain since last night getting worse, pt denies any injury or trauma.

## 2020-09-12 NOTE — Discharge Instructions (Addendum)
You have been diagnosed with bursitis.  This is an inflammatory condition.  Typically this improves with rest, using ice packs, and taking Naprosyn, 500 mg, twice daily.  Return here for concerning changes in your condition.

## 2020-09-13 ENCOUNTER — Ambulatory Visit: Payer: Medicare Other

## 2020-09-13 ENCOUNTER — Ambulatory Visit: Payer: Medicare Other | Admitting: Occupational Therapy

## 2020-09-19 ENCOUNTER — Ambulatory Visit: Payer: Medicare Other

## 2020-09-19 ENCOUNTER — Encounter: Payer: Self-pay | Admitting: Physical Medicine & Rehabilitation

## 2020-09-19 ENCOUNTER — Other Ambulatory Visit: Payer: Self-pay

## 2020-09-19 ENCOUNTER — Encounter: Payer: Medicare Other | Attending: Physical Medicine & Rehabilitation | Admitting: Physical Medicine & Rehabilitation

## 2020-09-19 VITALS — BP 104/69 | HR 106 | Temp 98.9°F | Ht 61.0 in | Wt 164.0 lb

## 2020-09-19 DIAGNOSIS — M722 Plantar fascial fibromatosis: Secondary | ICD-10-CM | POA: Insufficient documentation

## 2020-09-19 MED ORDER — TIZANIDINE HCL 4 MG PO TABS
4.0000 mg | ORAL_TABLET | Freq: Every day | ORAL | 11 refills | Status: DC
Start: 2020-09-19 — End: 2022-01-29

## 2020-09-19 MED FILL — tiZANidine HCL 4 MG TABS: 4 | 30 days supply | Qty: 30 | Fill #0

## 2020-09-19 NOTE — Progress Notes (Signed)
Subjective:    Patient ID: Christina Evans, female    DOB: Oct 22, 1961, 59 y.o.   MRN: [id number] 59 year old female who had a right MCA distribution infarct in 2008 while living in [place].  She underwent rehabilitation.  She has subsequently moved to the [place] area.  She was hospitalized in October 2020 for mental status  changes but no new stroke was identified.  She underwent neurologic evaluation first by Dr. Pearlean Brownie and then was referred to Dr. Terrace Arabia 11/30/2019 for botulinum toxin injections. The patient had MRI of the brain on 10/20/2019 which I reviewed demonstrating: Large chronic right MCA infarct.  Patient and daughter indicate that the arm and the leg are giving the patient problems. Was hospitalized for L ACA infarct in Oct 2020 which later evaluations questioned whether this was an acute finding.  L ACA infarct would not explain increased weakness LUE or LLE  Dysport 08/08/2020 Left Trunk Pec 200 Upper ext Biceps 200 FCR 200 FDS 200 FDP 200 Lower ext PT 300 FDL 200  HPI Interval history has been getting outpatient physical therapy.  Has developed left heel pain, went to the ED 7 days ago and was diagnosed with calcaneal bursitis.  Patient does not feel like the last botulinum toxin injection was particularly helpful for her.  Pain Inventory Average Pain 10 Pain Right Now 8 My pain is sharp and aching  In the last 24 hours, has pain interfered with the following? General activity 5 Relation with others 5 Enjoyment of life 5 What TIME of day is your pain at its worst? morning  and night Sleep (in general) varies  Pain is worse with: walking and standing Pain improves with: medication Relief from Meds: 5  Family History  Problem Relation Age of Onset  . Hypertension Mother   . Hypertension Father    Social History   Socioeconomic History  . Marital status: Single    Spouse name: Not on file  . Number of children: Not on file  . Years of education: Not on  file  . Highest education level: Not on file  Occupational History  . Not on file  Tobacco Use  . Smoking status: Former Games developer  . Smokeless tobacco: Never Used  . Tobacco comment: QUIT IN 2008  Vaping Use  . Vaping Use: Never used  Substance and Sexual Activity  . Alcohol use: Yes    Comment: RARE  . Drug use: No  . Sexual activity: Not Currently  Other Topics Concern  . Not on file  Social History Narrative   Lives at home with significant other (not married).     Stays in close communication with 2 of her children who live in [place].     She has multiple other children who currently live in [place].     Former smoker, quit in 2008.    Right handed   No caffeine   Social Determinants of Health   Financial Resource Strain:   . Difficulty of Paying Living Expenses: Not on file  Food Insecurity:   . Worried About Programme researcher, broadcasting/film/video in the Last Year: Not on file  . Ran Out of Food in the Last Year: Not on file  Transportation Needs:   . Lack of Transportation (Medical): Not on file  . Lack of Transportation (Non-Medical): Not on file  Physical Activity:   . Days of Exercise per Week: Not on file  . Minutes of Exercise per Session: Not on file  Stress:   .  Feeling of Stress : Not on file  Social Connections:   . Frequency of Communication with Friends and Family: Not on file  . Frequency of Social Gatherings with Friends and Family: Not on file  . Attends Religious Services: Not on file  . Active Member of Clubs or Organizations: Not on file  . Attends Banker Meetings: Not on file  . Marital Status: Not on file   Past Surgical History:  Procedure Laterality Date  . IR ANGIO INTRA EXTRACRAN SEL COM CAROTID INNOMINATE BILAT MOD SED  09/15/2019  . IR ANGIO VERTEBRAL SEL VERTEBRAL UNI L MOD SED  09/15/2019   Past Surgical History:  Procedure Laterality Date  . IR ANGIO INTRA EXTRACRAN SEL COM CAROTID INNOMINATE BILAT MOD SED  09/15/2019  . IR  ANGIO VERTEBRAL SEL VERTEBRAL UNI L MOD SED  09/15/2019   Past Medical History:  Diagnosis Date  . Asthma   . CVA (cerebral vascular accident) ([facility])   . Diabetes mellitus without complication ([facility])    BP 104/69   Pulse (!) 106   Temp 98.9 F (37.2 C)   Ht 5\' 1"  (1.549 m)   Wt 164 lb (74.4 kg)   SpO2 93%   BMI 30.99 kg/m   Opioid Risk Score:   Fall Risk Score:  `1  Depression screen PHQ 2/9  Depression screen [facility] 2/9 06/18/2020 03/29/2020  Decreased Interest 0 2  Down, Depressed, Hopeless 0 2  PHQ - 2 Score 0 4  Altered sleeping - 3  Tired, decreased energy - 3  Change in appetite - 2  Feeling bad or failure about yourself  - 0  Trouble concentrating - 3  Moving slowly or fidgety/restless - 2  Suicidal thoughts - 1  PHQ-9 Score - 18    Review of Systems  Constitutional: Negative.   HENT: Negative.   Eyes: Negative.   Respiratory: Negative.   Cardiovascular: Negative.   Gastrointestinal: Negative.   Endocrine: Negative.   Genitourinary: Negative.   Musculoskeletal: Positive for gait problem.       Spasticity  Skin: Negative.   Allergic/Immunologic: Negative.   Psychiatric/Behavioral: Negative.   All other systems reviewed and are negative.      Objective:   Physical Exam Vitals and nursing note reviewed.  Constitutional:      Appearance: She is obese.  HENT:     Head: Normocephalic and atraumatic.  Eyes:     Extraocular Movements: Extraocular movements intact.     Conjunctiva/sclera: Conjunctivae normal.     Pupils: Pupils are equal, round, and reactive to light.  Neurological:     Mental Status: She is alert. Mental status is at baseline. She is confused.     Motor: Weakness and abnormal muscle tone present.     Comments: Motor strength is 3 - at the left deltoid to minus at the bicep and tricep to minus finger flexors 0 finger extensors Tone MAS 3 at the elbow flexors MAS 3 at the finger flexors MAS 2 at the wrist flexors Left lower extremity has  mild toe curling as well as foot inversion during standing.  Decreased passive ankle dorsiflexion. Knee extension 4 ankle dorsiflexion trace  Psychiatric:        Mood and Affect: Mood normal.        Behavior: Behavior normal.           Assessment & Plan:  #1.  Left spastic hemiplegia secondary to right MCA distribution infarct, remote, 2008.  Patient likely  has some element of contracture given the chronicity of her abnormalities and limited response to botulinum toxin injection even at maximum doses.  As discussed with patient and daughter will hold off on reinjection with botulinum toxin at this time. As discussed patient to follow-up with her neurologist as well as primary care physician.  She does get some benefit from tizanidine 4 mg nightly will write her a 76-month supply I will see her back in 1 year.

## 2020-09-19 NOTE — Patient Instructions (Signed)
Please call if the spasms worsen and you'd like to start the injections again

## 2020-10-07 ENCOUNTER — Ambulatory Visit: Payer: Medicare Other | Admitting: Podiatry

## 2020-10-07 ENCOUNTER — Other Ambulatory Visit: Payer: Self-pay

## 2020-10-16 ENCOUNTER — Ambulatory Visit: Payer: Medicare Other | Admitting: Podiatry

## 2020-10-23 ENCOUNTER — Other Ambulatory Visit (HOSPITAL_COMMUNITY): Payer: Self-pay | Admitting: Internal Medicine

## 2020-10-23 ENCOUNTER — Other Ambulatory Visit (INDEPENDENT_AMBULATORY_CARE_PROVIDER_SITE_OTHER): Payer: Self-pay | Admitting: Primary Care

## 2020-10-23 MED FILL — METFORMIN HCL 1000 MG TABS: 1000 | 90 days supply | Qty: 180 | Fill #1

## 2020-10-24 MED FILL — ACCU-CHEK GUIDE TEST STRIP: 30 days supply | Qty: 100 | Fill #0

## 2020-11-04 MED FILL — CLOPIDOGREL 75 MG TABLET: 75 | 90 days supply | Qty: 90 | Fill #1

## 2020-11-05 MED FILL — ACCU-CHEK GUIDE TEST STRIP: 30 days supply | Qty: 100 | Fill #0

## 2020-11-17 IMAGING — DX DG FOOT COMPLETE 3+V*L*
3 series · 3 of 3 positions shown · non-contrast
Comparison: 09/13/2019

CLINICAL DATA: Left foot pain.  No known injury

EXAM:
LEFT FOOT - COMPLETE 3+ VIEW

[foot ap]
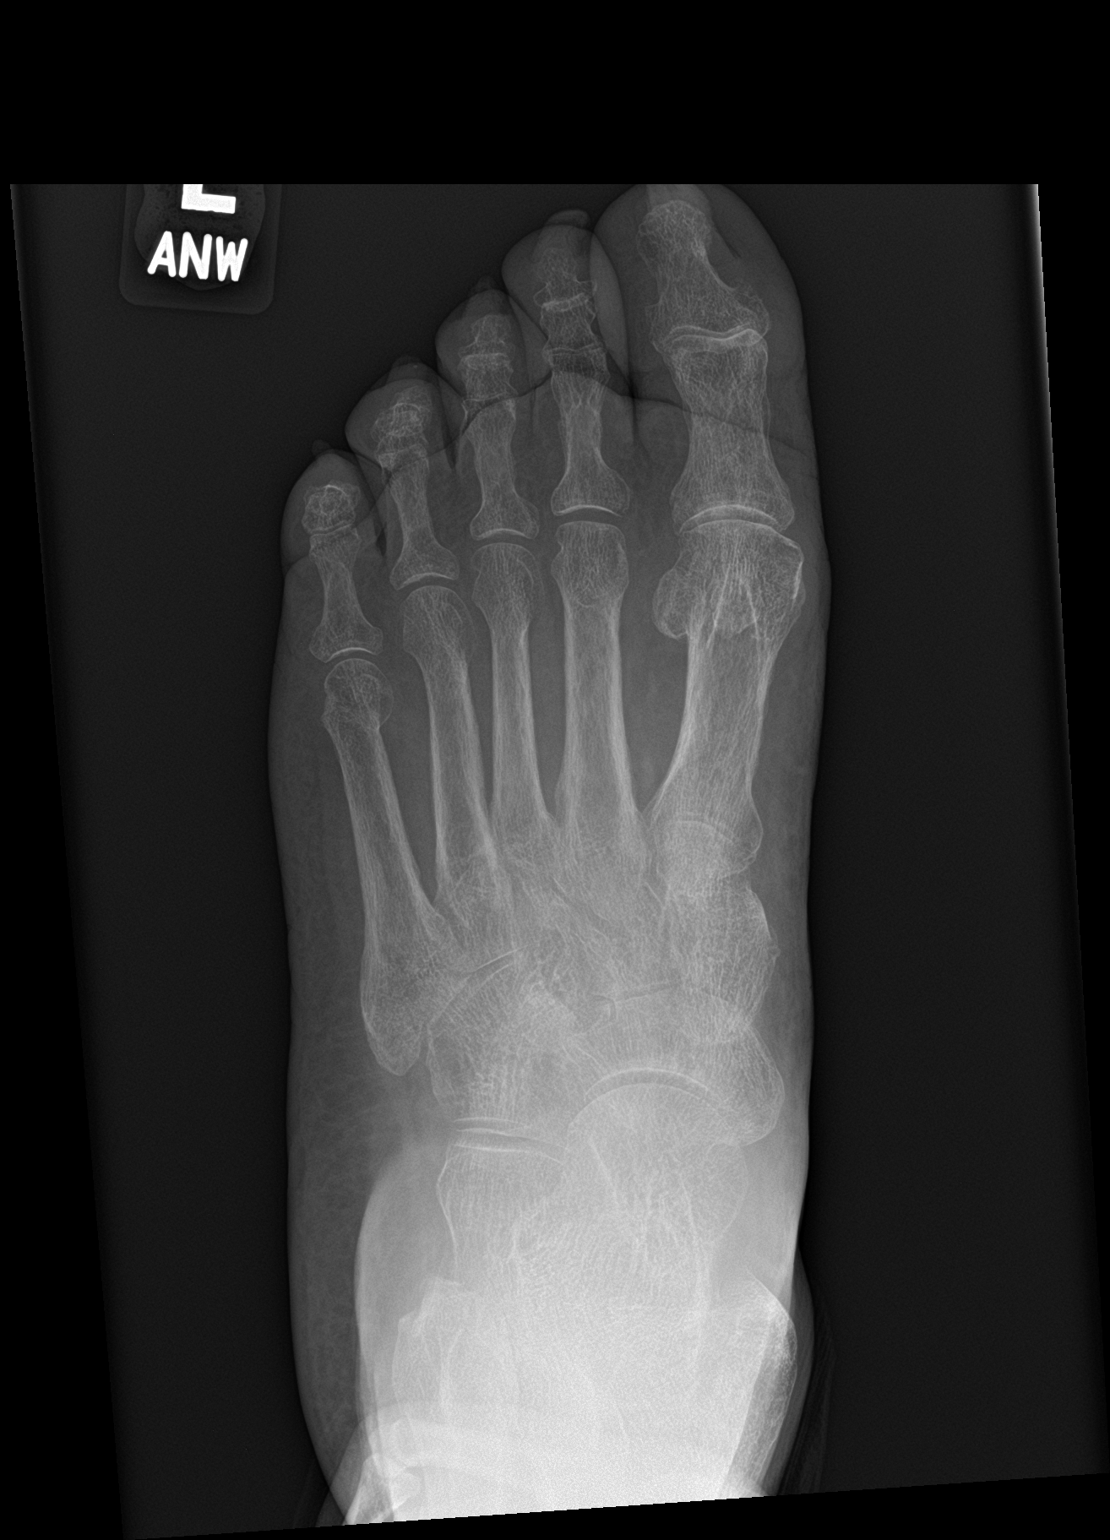

[foot obl]
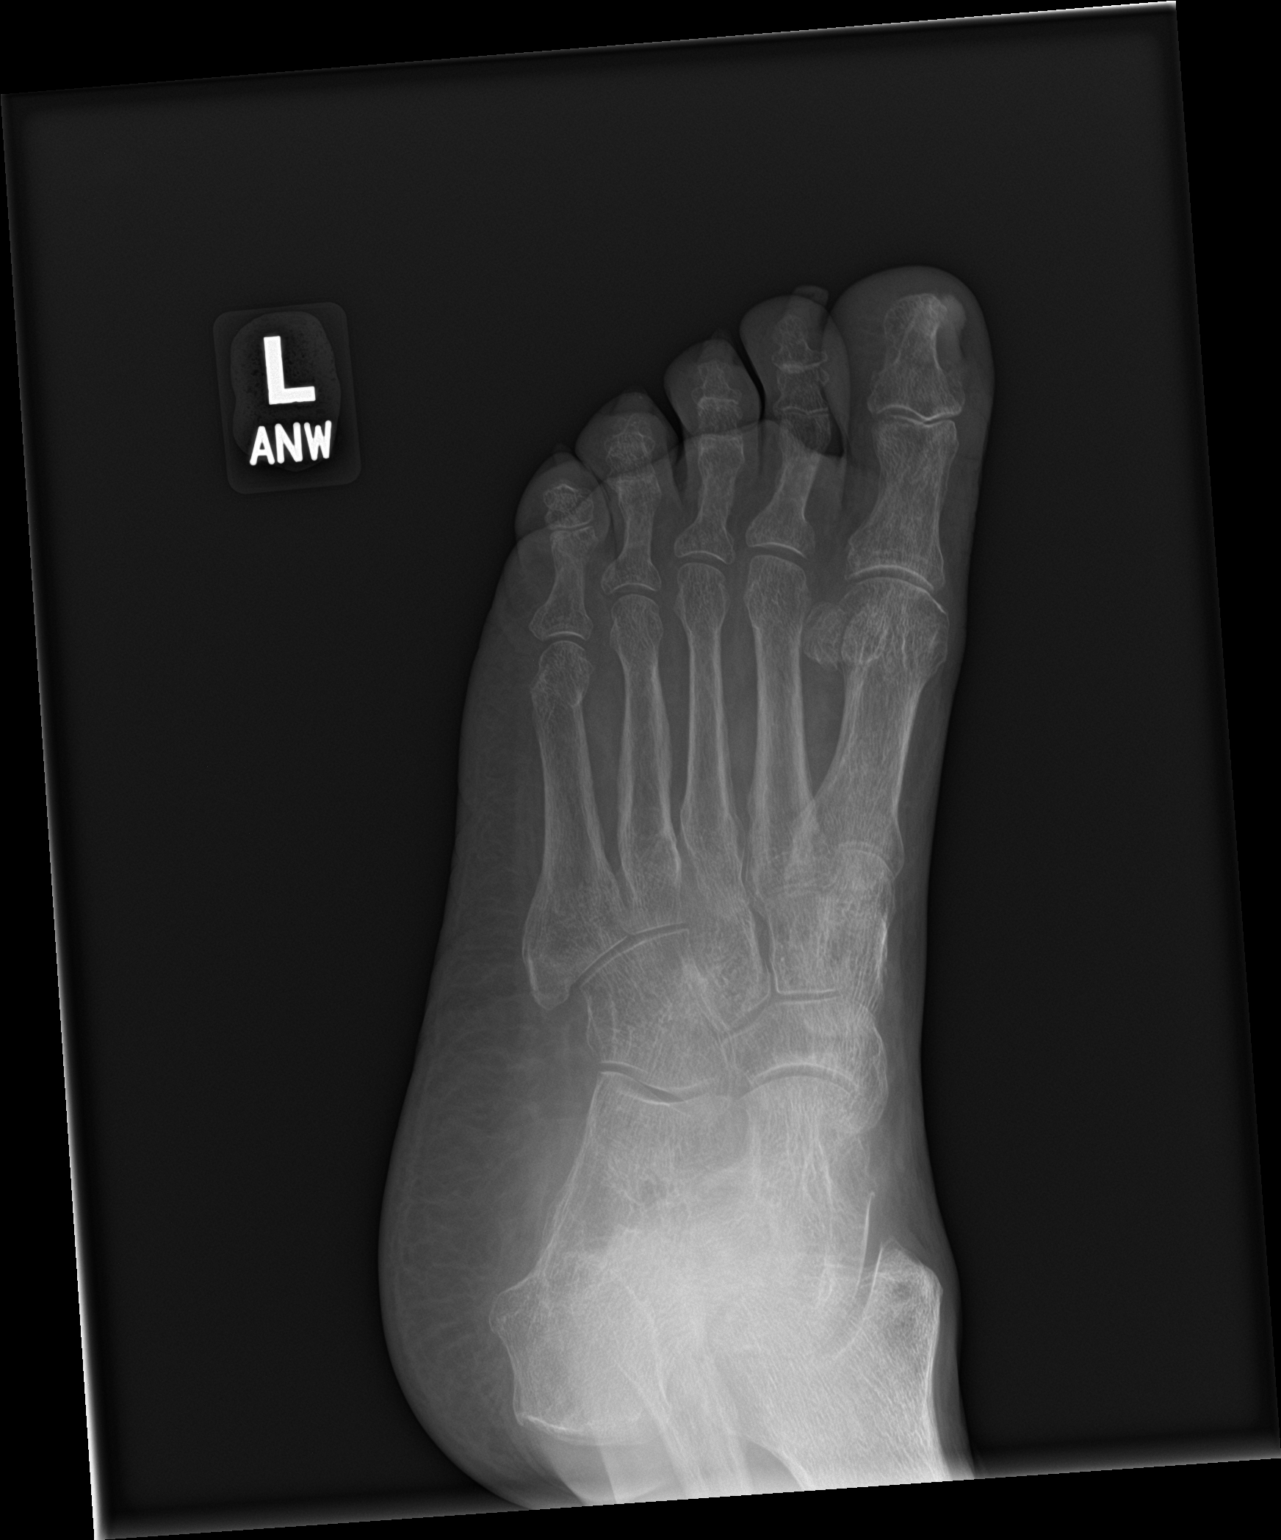

[foot lat]
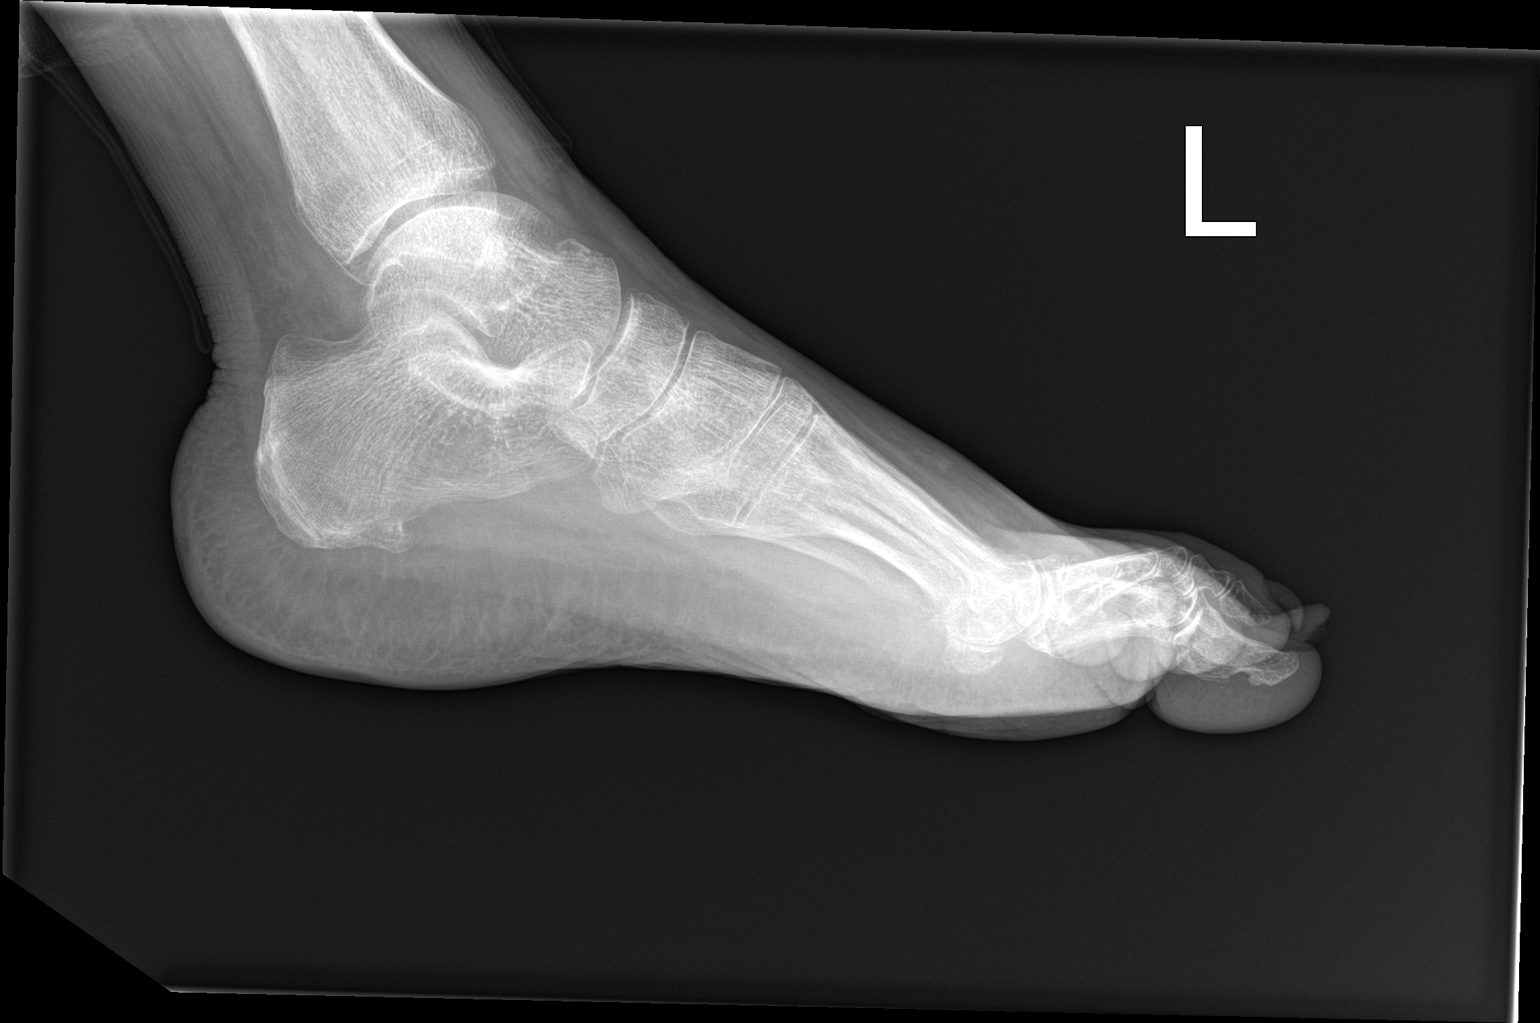

[3 of 3 positions shown; findings below may reference images not displayed]

FINDINGS: Bones are demineralized. Previously seen fifth metatarsal base
fracture has healed. No evidence of new or acute fracture. No
dislocation. Mild first MTP joint space narrowing. Small plantar
calcaneal spur. Soft tissues unremarkable.
IMPRESSION: 1. No acute osseous abnormality, left foot.
2. Mild first MTP joint space narrowing.

## 2020-11-25 ENCOUNTER — Other Ambulatory Visit (HOSPITAL_COMMUNITY): Payer: Self-pay | Admitting: Internal Medicine

## 2020-11-25 MED FILL — CITALOPRAM HBR 40 MG TABLET: 40 | 90 days supply | Qty: 90 | Fill #0

## 2020-11-25 MED FILL — ATORVASTATIN 80 MG TABLET: 80 | 90 days supply | Qty: 90 | Fill #0

## 2020-11-26 MED FILL — ACCU-CHEK GUIDE TEST STRIP: 30 days supply | Qty: 100 | Fill #0

## 2020-11-27 ENCOUNTER — Ambulatory Visit: Payer: Medicare Other | Attending: Neurology

## 2020-11-27 ENCOUNTER — Other Ambulatory Visit: Payer: Self-pay

## 2020-11-27 DIAGNOSIS — M6281 Muscle weakness (generalized): Secondary | ICD-10-CM | POA: Insufficient documentation

## 2020-11-27 DIAGNOSIS — R293 Abnormal posture: Secondary | ICD-10-CM | POA: Diagnosis present

## 2020-11-27 DIAGNOSIS — R2689 Other abnormalities of gait and mobility: Secondary | ICD-10-CM | POA: Insufficient documentation

## 2020-11-27 DIAGNOSIS — R2681 Unsteadiness on feet: Secondary | ICD-10-CM | POA: Insufficient documentation

## 2020-11-27 DIAGNOSIS — I69354 Hemiplegia and hemiparesis following cerebral infarction affecting left non-dominant side: Secondary | ICD-10-CM | POA: Diagnosis present

## 2020-11-28 NOTE — Therapy (Signed)
[facility] [address] [place], [place], [address] Phone: [phone]   Fax:  [phone]  Physical Therapy Evaluation  Patient Details  Name: Christina Evans MRN: [id number] Date of Birth: 06/23/1961 Referring Provider (PT): Annita Brod   Encounter Date: 11/27/2020   PT End of Session - 11/27/20 1625    Visit Number 1    Number of Visits 5    Date for PT Re-Evaluation 01/26/21   30 day poc, 60 day cert   Authorization Type [employer] Medicare    PT Start Time 1530    PT Stop Time 1625    PT Time Calculation (min) 55 min    Equipment Utilized During Treatment Gait belt    Activity Tolerance Patient tolerated treatment well    Behavior During Therapy [facility] for tasks assessed/performed           Past Medical History:  Diagnosis Date  . Asthma   . CVA (cerebral vascular accident) (HCC)   . Diabetes mellitus without complication [facility])     Past Surgical History:  Procedure Laterality Date  . IR ANGIO INTRA EXTRACRAN SEL COM CAROTID INNOMINATE BILAT MOD SED  09/15/2019  . IR ANGIO VERTEBRAL SEL VERTEBRAL UNI L MOD SED  09/15/2019    There were no vitals filed for this visit.    Subjective Assessment - 11/27/20 1534    Subjective Pt was referred from Dr. Jacqulynn Cadet for history of CVA. Pt reports recent fall last month which triggered referral. Pt had gotten up at night to get to the bathroom and went to turn on light and she lost balance. Reports being more fearful since then. Pt is walking with cane with quad tip. Pt reports that after finishing therapy last time in October she continued to have more pain in her left foot. She had ER visit about the foot and they feel the cold may contribute. She has not been able to wear her AFO much as foot was swelling but was able to get it on today. Pt reports that she has not been doing her exercises and spends most of time in room still. Daughter reports that the doctor is referring  her for counseling to try to help with her anxiety but hasn't heard back on scheduling yet.    Patient is accompained by: Family member    Pertinent History PMH significant for large right MCA CVA, HTN, T2DM, HLD, hx of Left foot fifth metatarsal fracture with osteopenia, asthma.    Patient Stated Goals To walk better and not be fearful of falling.    Currently in Pain? No/denies              [facility] PT Assessment - 11/27/20 1541      Assessment   Medical Diagnosis CVA    Referring Provider (PT) Annita Brod    Onset Date/Surgical Date 11/18/20   referral date   Hand Dominance Right      Precautions   Precautions Fall      Balance Screen   Has the patient fallen in the past 6 months Yes    How many times? 2    Has the patient had a decrease in activity level because of a fear of falling?  Yes    Is the patient reluctant to leave their home because of a fear of falling?  Yes      Home Environment   Living Environment Private residence    Living Arrangements Children;Other (Comment)  Available Help at Discharge Family    Type of [place] Access Level entry    Home Layout One level    Home Equipment Wheelchair - manual;Other (comment);Shower seat;Tub bench;Bedside commode;Hospital bed;Cane - single point;Grab bars - tub/shower      Prior Function   Level of Independence Independent;Independent with community mobility with device;Independent with basic ADLs    Vocation On disability    Leisure like to joke alot, used to like shopping at [employer]   Overall Cognitive Status Impaired/Different from baseline    Area of Impairment Orientation;Attention;Memory;Following commands;Safety/judgement;Awareness;Problem solving    Orientation Level Person;Place;Time      Sensation   Light Touch Appears Intact      Posture/Postural Control   Posture Comments left arm and hand in flexed position. Left posterior pelvic rotation in standing.      ROM /  Strength   AROM / PROM / Strength Strength      PROM   Overall PROM Comments Pt's left hip extension limited to just short of neutral.      Strength   Strength Assessment Site Shoulder;Elbow;Hand;Hip;Knee;Ankle    Right/Left Hip Right;Left    Right Hip Flexion 4+/5    Right Hip Extension 4/5    Left Hip Flexion 2-/5    Left Hip Extension 2-/5    Left Hip ABduction 2-/5    Right/Left Knee Right;Left    Right Knee Flexion 5/5    Right Knee Extension 5/5    Left Knee Flexion 2-/5    Left Knee Extension 3+/5    Right/Left Ankle Right;Left    Right Ankle Dorsiflexion 5/5    Left Ankle Dorsiflexion 2-/5    Left Ankle Plantar Flexion 0/5      Bed Mobility   Bed Mobility Rolling Right;Rolling Left;Supine to Sit;Sit to Supine    Rolling Right Independent    Rolling Left Independent    Supine to Sit Supervision/Verbal cueing    Sit to Supine Supervision/Verbal cueing      Transfers   Transfers Sit to Stand;Stand to Sit    Sit to Stand 5: Supervision    Five time sit to stand comments  18.7 sec from chair with hand      Ambulation/Gait   Ambulation/Gait Yes    Ambulation/Gait Assistance 5: Supervision    Ambulation Distance (Feet) 100 Feet    Assistive device Straight cane   with quad tip, left custom posterior AFO   Gait Pattern Step-through pattern;Decreased step length - right;Decreased step length - left;Decreased arm swing - left;Decreased stance time - left;Trunk rotated posteriorly on left    Ambulation Surface Level;Indoor    Gait velocity 42.3 sec=0.35m/s      Standardized Balance Assessment   Standardized Balance Assessment Timed Up and Go Test      Timed Up and Go Test   TUG Normal TUG    Normal TUG (seconds) 34.82      LLE Tone   LLE Tone Moderate                      Objective measurements completed on examination: See above findings.               PT Education - 11/27/20 1642    Education Details PT discussed plan of care  including importance of pt taking ownership and working at home on her HEP    Person(s) Educated Patient;Child(ren)  Methods Explanation    Comprehension Verbalized understanding            PT Short Term Goals - 11/28/20 0903      PT SHORT TERM GOAL #1   Title STGs=LTGs             PT Long Term Goals - 11/28/20 0903      PT LONG TERM GOAL #1   Title Pt will be instructed in HEP/walking program for home to be able to continue with on own.    Time 4    Period Weeks    Status New    Target Date 12/28/20      PT LONG TERM GOAL #2   Title Pt will ambulate >400' on level surfaces with cane supervision for improved safety with household mobility and short community distances.    Time 4    Period Weeks    Status New    Target Date 12/28/20      PT LONG TERM GOAL #3   Title Pt will decrease TUG from 34.82 sec to <30 sec for improved balance and functional mobility.    Baseline 11/27/20 34.82 sec    Time 4    Period Weeks    Status New    Target Date 12/28/20                  Plan - 11/27/20 1645    Clinical Impression Statement Pt is 60 y/o female with history of CVA in 2020 with residual left hemiparesis. She is ambulating with cane with slow gait speed of 0.74m/s which indicates decreased safety with household ambulation. She is fall risk based on TUG of 34.82 sec and 5 x sit to stand of 18.7 sec. These score are about where pt was when was discharged from PT in October of 2021. Pt did have recent fall when getting to bathroom at night which has made her more fearful. Pt is still having trouble with finding motivation to want to work on exercises/walking at home and daughter reports that the doctor was supposed to be referring for a counselor to talk to to help with her anxiety. PT will pick up short term to reinstruct in HEP and gait training try to allow for more carry over at home.    Personal Factors and Comorbidities Comorbidity 3+;Past/Current Experience;Time  since onset of injury/illness/exacerbation;Behavior Pattern    Comorbidities PMH significant for large right MCA CVA, HTN, T2DM, HLD, hx of Left foot fifth metatarsal fracture with osteopenia, asthma.    Examination-Activity Limitations Stand;Locomotion Level;Transfers;Toileting;Continence;Dressing    Examination-Participation Restrictions Community Activity    Stability/Clinical Decision Making Evolving/Moderate complexity    Clinical Decision Making Moderate    Rehab Potential Fair    PT Frequency 1x / week   plus eval   PT Duration 4 weeks    PT Treatment/Interventions ADLs/Self Care Home Management;DME Instruction;Gait training;Functional mobility training;Neuromuscular re-education;Balance training;Therapeutic exercise;Therapeutic activities;Patient/family education;Orthotic Fit/Training;Passive range of motion;Energy conservation;Manual techniques    PT Next Visit Plan Review HEP from October and update as needed. Gait training with cane on varied surfaces. Balance training.    Consulted and Agree with Plan of Care Patient;Family member/caregiver    Family Member Consulted daughter           Patient will benefit from skilled therapeutic intervention in order to improve the following deficits and impairments:  Abnormal gait,Decreased activity tolerance,Decreased coordination,Decreased balance,Decreased cognition,Decreased endurance,Decreased knowledge of use of DME,Decreased range of motion,Decreased strength,Difficulty walking,Impaired tone,Impaired UE  functional use,Postural dysfunction,Impaired sensation  Visit Diagnosis: Other abnormalities of gait and mobility  Muscle weakness (generalized)  Unsteadiness on feet  Abnormal posture  Spastic hemiplegia of left nondominant side as late effect of cerebral infarction [facility])     Problem List Patient Active Problem List   Diagnosis Date Noted  . Cerebrovascular accident (CVA) due to thrombosis of precerebral artery (HCC)  11/30/2019  . 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 Spastic hemiparesis affecting nondominant side (HCC) 11/30/2019  . Chronic bilateral low back pain with sciatica 11/30/2019  . Metatarsal fracture 09/22/2019  . Depression 09/22/2019  . Asthma 09/22/2019  . Palliative care by specialist   . Goals of care, counseling/discussion   . Encephalopathy   . Acute CVA (cerebrovascular accident) (HCC) 09/14/2019  . Acute arterial ischemic stroke, multifocal, anterior circulation, right (HCC) 09/14/2019  . Severe comorbid illness   . AMS (altered mental status) 09/13/2019  . 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  . Hyperlipidemia 09/02/2017  . Hypertension 09/02/2017  . Chest pain 09/02/2017    Ronn Melena, PT, DPT, NCS 11/28/2020, 9:08 AM  [facility] [address] [place], [place], [address] Phone: [phone]   Fax:  [phone]  Name: Chessica Audia MRN: [id number] Date of Birth: Sep 25, 1961

## 2020-12-02 ENCOUNTER — Encounter: Payer: Self-pay | Admitting: Podiatry

## 2020-12-02 ENCOUNTER — Other Ambulatory Visit: Payer: Self-pay

## 2020-12-02 ENCOUNTER — Ambulatory Visit (INDEPENDENT_AMBULATORY_CARE_PROVIDER_SITE_OTHER): Payer: Medicare Other | Admitting: Podiatry

## 2020-12-02 DIAGNOSIS — M79675 Pain in left toe(s): Secondary | ICD-10-CM | POA: Diagnosis not present

## 2020-12-02 DIAGNOSIS — M79674 Pain in right toe(s): Secondary | ICD-10-CM

## 2020-12-02 DIAGNOSIS — B351 Tinea unguium: Secondary | ICD-10-CM

## 2020-12-02 DIAGNOSIS — M76822 Posterior tibial tendinitis, left leg: Secondary | ICD-10-CM | POA: Diagnosis not present

## 2020-12-03 NOTE — Progress Notes (Signed)
Subjective:   Patient ID: Christina Evans, female   DOB: 60 y.o.   MRN: [id number]   HPI Patient presents with caregiver after having had several strokes with exquisite discomfort in the medial side of the left ankle and severely thickened dystrophic painful nailbeds 1-5 both feet that she cannot take care of herself.  States this has been ongoing and that her daughter tries to take care of her but it is difficult.  Patient does not smoke and is not able to be active due to being in wheelchair   Review of Systems  All other systems reviewed and are negative.       Objective:  Physical Exam Vitals and nursing note reviewed.  Constitutional:      Appearance: She is well-developed and well-nourished.  Cardiovascular:     Pulses: Intact distal pulses.  Pulmonary:     Effort: Pulmonary effort is normal.  Musculoskeletal:        General: Normal range of motion.  Skin:    General: Skin is warm.  Neurological:     Mental Status: She is alert.     Neurovascular status was found to be intact muscle strength was found to be adequate range of motion adequate.  Patient has exquisite discomfort in the medial side of the left ankle near the insertion to the navicular with inflammation fluid buildup and is noted to have severely thickened dystrophic painful nailbeds 1-5 both feet.  Good digital perfusion well oriented x3     Assessment:  Poor health individual who has had several strokes who has what appears to be inflammation of the posterior tibial tendon left near insertion and also has severely thickened dystrophic painful nailbeds 1-5 both feet     Plan:  H&P reviewed both conditions.  I went ahead today and I did do sterile prep and injected the posterior tib near insertion 3 mg Dexasone Kenalog 5 mg Xylocaine to reduce inflammation and I debrided nailbeds 1-5 both feet no iatrogenic bleeding which can be done as needed.  Patient will present if symptoms indicate

## 2020-12-04 ENCOUNTER — Ambulatory Visit: Payer: Medicare Other | Admitting: Physical Therapy

## 2020-12-04 ENCOUNTER — Other Ambulatory Visit: Payer: Self-pay

## 2020-12-04 ENCOUNTER — Encounter: Payer: Self-pay | Admitting: Physical Therapy

## 2020-12-04 DIAGNOSIS — M6281 Muscle weakness (generalized): Secondary | ICD-10-CM

## 2020-12-04 DIAGNOSIS — R2689 Other abnormalities of gait and mobility: Secondary | ICD-10-CM | POA: Diagnosis not present

## 2020-12-04 DIAGNOSIS — R293 Abnormal posture: Secondary | ICD-10-CM

## 2020-12-04 DIAGNOSIS — R2681 Unsteadiness on feet: Secondary | ICD-10-CM

## 2020-12-04 DIAGNOSIS — I69354 Hemiplegia and hemiparesis following cerebral infarction affecting left non-dominant side: Secondary | ICD-10-CM

## 2020-12-04 NOTE — Therapy (Signed)
[facility] [address] [place], [place], [address] Phone: [phone]   Fax:  [phone]  Physical Therapy Treatment  Patient Details  Name: Chyanne Kohut MRN: [id number] Date of Birth: 1961-07-23 Referring Provider (PT): Annita Brod   Encounter Date: 12/04/2020   PT End of Session - 12/04/20 1345    Visit Number 2    Number of Visits 5    Date for PT Re-Evaluation 01/26/21   30 day poc, 60 day cert   Authorization Type UHC Medicare    PT Start Time 1255   PT able to see pt early   PT Stop Time 1337    PT Time Calculation (min) 42 min    Equipment Utilized During Treatment Gait belt    Activity Tolerance Patient tolerated treatment well    Behavior During Therapy [facility] for tasks assessed/performed           Past Medical History:  Diagnosis Date  . Asthma   . CVA (cerebral vascular accident) (HCC)   . Diabetes mellitus without complication [facility])     Past Surgical History:  Procedure Laterality Date  . IR ANGIO INTRA EXTRACRAN SEL COM CAROTID INNOMINATE BILAT MOD SED  09/15/2019  . IR ANGIO VERTEBRAL SEL VERTEBRAL UNI L MOD SED  09/15/2019    There were no vitals filed for this visit.   Subjective Assessment - 12/04/20 1300    Subjective Saw the foot doctor the other day, got an injection in L post tib and reports pain is feeling much better.    Patient is accompained by: Family member    Pertinent History PMH significant for large right MCA CVA, HTN, T2DM, HLD, hx of Left foot fifth metatarsal fracture with osteopenia, asthma.    Patient Stated Goals To walk better and not be fearful of falling.    Currently in Pain? No/denies                      Access Code: [email or web address] URL: [email or web address] Date: 12/04/2020 Prepared by: Sherlie Ban  Administered pt's HEP and educated on importance of incr movement at home in order to improve strength and balance as pt is  currently sitting the majority of the day. Educated on wearing L AFO and sneakers for all standing activities and gait around the house for improved safety. Took increased time to go over exercises as pt easily distractable during session today.   Program Notes walking program: wearing your brace and sneakers and using your cane, try to walk around the house 3 times a day for 1-2 minutes.    Exercises Supine Bridge - 1-2 x daily - 5 x weekly - 2 sets - 10 reps - verbal and tactile cues for full ROM  Sit to Stand with Armchair - 1-2 x daily - 5 x weekly - 2 sets - 5 reps - cues for equal weight bearing before sitting down  Side Stepping with Counter Support - 1-2 x daily - 5 x weekly - 2 sets - 10 reps - keeping LLE as stance leg, stepping right leg out and in  Standing March with Counter Support - 1-2 x daily - 5 x weekly - 2 sets - 10 reps - keeping LLE as stance leg, marching right leg up and down  Standing with Head Rotation - 1-2 x daily - 5 x weekly - 2 sets - 10 reps - standing in corner with no UE support and more narrow BOS  2 x 10 reps head turns, 2 x 10 reps head nods, needing verbal cues for technique          [facility] Adult PT Treatment/Exercise - 12/04/20 0001      Ambulation/Gait   Ambulation/Gait Yes    Ambulation/Gait Assistance 5: Supervision    Ambulation/Gait Assistance Details into and out of session and between activities in session    Assistive device Straight cane   with 3 prong tip   Gait Pattern Step-through pattern;Decreased step length - right;Decreased step length - left;Decreased arm swing - left;Decreased stance time - left;Trunk rotated posteriorly on left    Ambulation Surface Level;Indoor                  PT Education - 12/04/20 1344    Education Details reviewed HEP, importance of performing and moving more throughout the day, discussed with pt and pt's daughter about putting nightlights in hallway at home due to pt's recent fall    Person(s)  Educated Patient;Child(ren)    Methods Explanation;Demonstration;Tactile cues;Handout;Verbal cues    Comprehension Verbalized understanding;Returned demonstration;Verbal cues required            PT Short Term Goals - 11/28/20 0903      PT SHORT TERM GOAL #1   Title STGs=LTGs             PT Long Term Goals - 11/28/20 0903      PT LONG TERM GOAL #1   Title Pt will be instructed in HEP/walking program for home to be able to continue with on own.    Time 4    Period Weeks    Status New    Target Date 12/28/20      PT LONG TERM GOAL #2   Title Pt will ambulate >400' on level surfaces with cane supervision for improved safety with household mobility and short community distances.    Time 4    Period Weeks    Status New    Target Date 12/28/20      PT LONG TERM GOAL #3   Title Pt will decrease TUG from 34.82 sec to <30 sec for improved balance and functional mobility.    Baseline 11/27/20 34.82 sec    Time 4    Period Weeks    Status New    Target Date 12/28/20                 Plan - 12/04/20 1350    Clinical Impression Statement Today's skilled session focused on administering pt's HEP with focus on balance and leg strengthening. Pt reports she is more willing to work on exercises at home if they focus on balance. Took increased time to go through exercises as pt easily distracted and having a tendency to look around therapy gym. Will continue to progress towards LTGs.    Personal Factors and Comorbidities Comorbidity 3+;Past/Current Experience;Time since onset of injury/illness/exacerbation;Behavior Pattern    Comorbidities PMH significant for large right MCA CVA, HTN, T2DM, HLD, hx of Left foot fifth metatarsal fracture with osteopenia, asthma.    Examination-Activity Limitations Stand;Locomotion Level;Transfers;Toileting;Continence;Dressing    Examination-Participation Restrictions Community Activity    Stability/Clinical Decision Making Evolving/Moderate  complexity    Rehab Potential Fair    PT Frequency 1x / week   plus eval   PT Duration 4 weeks    PT Treatment/Interventions ADLs/Self Care Home Management;DME Instruction;Gait training;Functional mobility training;Neuromuscular re-education;Balance training;Therapeutic exercise;Therapeutic activities;Patient/family education;Orthotic Fit/Training;Passive range of motion;Energy conservation;Manual techniques  PT Next Visit Plan how is HEP going? Gait training with cane on varied surfaces. Balance training.    Consulted and Agree with Plan of Care Patient;Family member/caregiver    Family Member Consulted daughter           Patient will benefit from skilled therapeutic intervention in order to improve the following deficits and impairments:  Abnormal gait,Decreased activity tolerance,Decreased coordination,Decreased balance,Decreased cognition,Decreased endurance,Decreased knowledge of use of DME,Decreased range of motion,Decreased strength,Difficulty walking,Impaired tone,Impaired UE functional use,Postural dysfunction,Impaired sensation  Visit Diagnosis: Other abnormalities of gait and mobility  Muscle weakness (generalized)  Unsteadiness on feet  Abnormal posture  Spastic hemiplegia of left nondominant side as late effect of cerebral infarction [facility])     Problem List Patient Active Problem List   Diagnosis Date Noted  . Cerebrovascular accident (CVA) due to thrombosis of precerebral artery (HCC) 11/30/2019  . 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 Spastic hemiparesis affecting nondominant side (HCC) 11/30/2019  . Chronic bilateral low back pain with sciatica 11/30/2019  . Metatarsal fracture 09/22/2019  . Depression 09/22/2019  . Asthma 09/22/2019  . Palliative care by specialist   . Goals of care, counseling/discussion   . Encephalopathy   . Acute CVA (cerebrovascular accident) (HCC) 09/14/2019  . Acute arterial  ischemic stroke, multifocal, anterior circulation, right (HCC) 09/14/2019  . Severe comorbid illness   . AMS (altered mental status) 09/13/2019  . 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  . Hyperlipidemia 09/02/2017  . Hypertension 09/02/2017  . Chest pain 09/02/2017    Drake Leach, PT, DPT  12/04/2020, 1:54 PM  [facility] [address] [place], [place], [address] Phone: [phone]   Fax:  [phone]  Name: Junell Cullifer MRN: [id number] Date of Birth: 1961/11/10

## 2020-12-04 NOTE — Patient Instructions (Signed)
Access Code: [email or web address] URL: [email or web address] Date: 12/04/2020 Prepared by: Sherlie Ban  Program Notes walking program: wearing your brace and sneakers and using your cane, try to walk around the house 3 times a day for 1-2 minutes.    Exercises Supine Bridge - 1-2 x daily - 5 x weekly - 2 sets - 10 reps Sit to Stand with Armchair - 1-2 x daily - 5 x weekly - 2 sets - 5 reps Side Stepping with Counter Support - 1-2 x daily - 5 x weekly - 2 sets - 10 reps Standing March with Counter Support - 1-2 x daily - 5 x weekly - 2 sets - 10 reps Standing with Head Rotation - 1-2 x daily - 5 x weekly - 2 sets - 10 reps

## 2020-12-09 ENCOUNTER — Ambulatory Visit: Payer: Medicare Other | Admitting: Physical Therapy

## 2020-12-11 ENCOUNTER — Ambulatory Visit: Payer: Medicare Other | Admitting: Physical Therapy

## 2020-12-16 ENCOUNTER — Other Ambulatory Visit: Payer: Self-pay | Admitting: Nurse Practitioner

## 2020-12-16 DIAGNOSIS — Z1231 Encounter for screening mammogram for malignant neoplasm of breast: Secondary | ICD-10-CM

## 2020-12-18 ENCOUNTER — Other Ambulatory Visit: Payer: Self-pay

## 2020-12-18 ENCOUNTER — Encounter: Payer: Self-pay | Admitting: Physical Therapy

## 2020-12-18 ENCOUNTER — Ambulatory Visit: Payer: Medicare Other | Admitting: Physical Therapy

## 2020-12-18 DIAGNOSIS — R2681 Unsteadiness on feet: Secondary | ICD-10-CM

## 2020-12-18 DIAGNOSIS — I69354 Hemiplegia and hemiparesis following cerebral infarction affecting left non-dominant side: Secondary | ICD-10-CM

## 2020-12-18 DIAGNOSIS — R2689 Other abnormalities of gait and mobility: Secondary | ICD-10-CM | POA: Diagnosis not present

## 2020-12-18 DIAGNOSIS — M6281 Muscle weakness (generalized): Secondary | ICD-10-CM

## 2020-12-18 DIAGNOSIS — R293 Abnormal posture: Secondary | ICD-10-CM

## 2020-12-18 NOTE — Therapy (Signed)
[facility] [address] [place], [place], [address] Phone: [phone]   Fax:  [phone]  Physical Therapy Treatment  Patient Details  Name: Christina Evans MRN: [id number] Date of Birth: 12-23-1960 Referring Provider (PT): Annita Brod   Encounter Date: 12/18/2020   PT End of Session - 12/18/20 1709    Visit Number 3    Number of Visits 5    Date for PT Re-Evaluation 01/26/21   30 day poc, 60 day cert   Authorization Type [employer]    PT Start Time 1530    PT Stop Time 1612    PT Time Calculation (min) 42 min    Equipment Utilized During Treatment Gait belt    Activity Tolerance Patient tolerated treatment well    Behavior During Therapy [facility] for tasks assessed/performed           Past Medical History:  Diagnosis Date  . Asthma   . CVA (cerebral vascular accident) (HCC)   . Diabetes mellitus without complication [facility])     Past Surgical History:  Procedure Laterality Date  . IR ANGIO INTRA EXTRACRAN SEL COM CAROTID INNOMINATE BILAT MOD SED  09/15/2019  . IR ANGIO VERTEBRAL SEL VERTEBRAL UNI L MOD SED  09/15/2019    There were no vitals filed for this visit.   Subjective Assessment - 12/18/20 1535    Subjective Foot is not hurting like it used to. Has been trying the exercises at home. Wants to see if the head turns exercise can be made easier.    Patient is accompained by: Family member    Pertinent History PMH significant for large right MCA CVA, HTN, T2DM, HLD, hx of Left foot fifth metatarsal fracture with osteopenia, asthma.    Patient Stated Goals To walk better and not be fearful of falling.    Currently in Pain? No/denies                             [facility] Adult PT Treatment/Exercise - 12/18/20 0001      Transfers   Comments cues for proper foot placement when standing as pt with too narrow BOS when standing      Ambulation/Gait   Ambulation/Gait Yes     Ambulation/Gait Assistance 5: Supervision;4: Min guard    Ambulation/Gait Assistance Details in and out of clinic with [facility] with 3 prong tip    Assistive device Straight cane   with 3 prong tip   Gait Pattern Step-through pattern;Decreased step length - right;Decreased step length - left;Decreased arm swing - left;Decreased stance time - left;Trunk rotated posteriorly on left    Ambulation Surface Level;Indoor    Gait Comments small distance gait of 30-40' weaving in and out of cones, ambulating over blue mat for a compliant surface, and performing performing head turns and head nods over level surfaces with min guard at times for balance, performing x5 reps total               Balance Exercises - 12/18/20 0001      Balance Exercises: Standing   Standing Eyes Opened Narrow base of support (BOS);Wide (BOA);Foam/compliant surface;Solid surface    Standing Eyes Opened Limitations on level ground narrow BOS: 2 x 10 reps head turns, 2 x 10 reps head nods, then on foam with wide BOS: head turns x10 reps, head nods x10 reps, on blue air ex: with more wide BOS weight shifting R/L x10  reps    Standing Eyes Closed Solid surface;Limitations    Standing Eyes Closed Limitations feet hip width distance on level ground 3 x 30 seconds and more narrow BOS 3 x 10 seconds before pt needing to hold onto chair for balance    SLS with Vectors Solid surface;Upper extremity assist 1;Limitations    SLS with Vectors Limitations Forward toe taps with RLE to floor bubble anteriorly x10 reps and then forward tap and cross body tap x10 reps for incr stance time on LLE, RUE support for balance    Partial Tandem Stance Eyes open;3 reps;30 secs;Limitations    Partial Tandem Stance Limitations modified with RLE posteriorly             PT Education - 12/18/20 1709    Education Details reviewed standing balance with head motions to HEP    Person(s) Educated Patient    Methods Explanation;Demonstration;Verbal cues     Comprehension Verbalized understanding;Returned demonstration            PT Short Term Goals - 11/28/20 0903      PT SHORT TERM GOAL #1   Title STGs=LTGs             PT Long Term Goals - 11/28/20 0903      PT LONG TERM GOAL #1   Title Pt will be instructed in HEP/walking program for home to be able to continue with on own.    Time 4    Period Weeks    Status New    Target Date 12/28/20      PT LONG TERM GOAL #2   Title Pt will ambulate >400' on level surfaces with cane supervision for improved safety with household mobility and short community distances.    Time 4    Period Weeks    Status New    Target Date 12/28/20      PT LONG TERM GOAL #3   Title Pt will decrease TUG from 34.82 sec to <30 sec for improved balance and functional mobility.    Baseline 11/27/20 34.82 sec    Time 4    Period Weeks    Status New    Target Date 12/28/20                 Plan - 12/18/20 1713    Clinical Impression Statement Today's skilled session focused on balance strategies with decr UE support on level ground and compliant surfaces, SLS, and dynamic gait activities. Pt needing verbal and demo cues on how to properly perform corner standing balance exercises and to perform without UE when appropriate. Pt fearful at times without UE support with balance. Will continue to progress towards LTGs.    Personal Factors and Comorbidities Comorbidity 3+;Past/Current Experience;Time since onset of injury/illness/exacerbation;Behavior Pattern    Comorbidities PMH significant for large right MCA CVA, HTN, T2DM, HLD, hx of Left foot fifth metatarsal fracture with osteopenia, asthma.    Examination-Activity Limitations Stand;Locomotion Level;Transfers;Toileting;Continence;Dressing    Examination-Participation Restrictions Community Activity    Stability/Clinical Decision Making Evolving/Moderate complexity    Rehab Potential Fair    PT Frequency 1x / week   plus eval   PT Duration 4 weeks     PT Treatment/Interventions ADLs/Self Care Home Management;DME Instruction;Gait training;Functional mobility training;Neuromuscular re-education;Balance training;Therapeutic exercise;Therapeutic activities;Patient/family education;Orthotic Fit/Training;Passive range of motion;Energy conservation;Manual techniques    PT Next Visit Plan pt needs  1 more appt added due to missing a week due to snow (was unable to add it today  due to pt's daughter not present). how is HEP going? Gait training with cane on varied surfaces. Balance training.    Consulted and Agree with Plan of Care Patient;Family member/caregiver    Family Member Consulted daughter           Patient will benefit from skilled therapeutic intervention in order to improve the following deficits and impairments:  Abnormal gait,Decreased activity tolerance,Decreased coordination,Decreased balance,Decreased cognition,Decreased endurance,Decreased knowledge of use of DME,Decreased range of motion,Decreased strength,Difficulty walking,Impaired tone,Impaired UE functional use,Postural dysfunction,Impaired sensation  Visit Diagnosis: Other abnormalities of gait and mobility  Muscle weakness (generalized)  Unsteadiness on feet  Abnormal posture  Spastic hemiplegia of left nondominant side as late effect of cerebral infarction [facility])     Problem List Patient Active Problem List   Diagnosis Date Noted  . Cerebrovascular accident (CVA) due to thrombosis of precerebral artery (HCC) 11/30/2019  . 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 Spastic hemiparesis affecting nondominant side (HCC) 11/30/2019  . Chronic bilateral low back pain with sciatica 11/30/2019  . Metatarsal fracture 09/22/2019  . Depression 09/22/2019  . Asthma 09/22/2019  . Palliative care by specialist   . Goals of care, counseling/discussion   . Encephalopathy   . Acute CVA (cerebrovascular accident) (HCC) 09/14/2019   . Acute arterial ischemic stroke, multifocal, anterior circulation, right (HCC) 09/14/2019  . Severe comorbid illness   . AMS (altered mental status) 09/13/2019  . 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  . Hyperlipidemia 09/02/2017  . Hypertension 09/02/2017  . Chest pain 09/02/2017    Drake Leach, PT, DPT  12/18/2020, 5:15 PM  [facility] [address] [place], [place], [address] Phone: [phone]   Fax:  [phone]  Name: Christina Evans MRN: [id number] Date of Birth: 1961/01/29

## 2020-12-24 ENCOUNTER — Ambulatory Visit: Payer: Medicare Other | Attending: Neurology

## 2021-01-01 MED FILL — ACCU-CHEK GUIDE TEST STRIP: 30 days supply | Qty: 100 | Fill #1

## 2021-01-22 MED FILL — METFORMIN HCL 1000 MG TABS: 1000 | 90 days supply | Qty: 180 | Fill #2

## 2021-01-29 MED FILL — CLOPIDOGREL 75 MG TABLET: 75 | 90 days supply | Qty: 90 | Fill #2

## 2021-01-29 MED FILL — ACCU-CHEK GUIDE TEST STRIP: 30 days supply | Qty: 100 | Fill #1

## 2021-01-30 ENCOUNTER — Ambulatory Visit
Admission: RE | Admit: 2021-01-30 | Discharge: 2021-01-30 | Disposition: A | Discharge status: Home / Self Care | Payer: Medicare Other | Source: Ambulatory Visit | Attending: Nurse Practitioner | Admitting: Nurse Practitioner

## 2021-01-30 ENCOUNTER — Other Ambulatory Visit: Payer: Self-pay

## 2021-01-30 DIAGNOSIS — Z1231 Encounter for screening mammogram for malignant neoplasm of breast: Secondary | ICD-10-CM

## 2021-02-04 ENCOUNTER — Other Ambulatory Visit: Payer: Self-pay | Admitting: Nurse Practitioner

## 2021-02-04 DIAGNOSIS — R928 Other abnormal and inconclusive findings on diagnostic imaging of breast: Secondary | ICD-10-CM

## 2021-02-20 ENCOUNTER — Ambulatory Visit
Admission: RE | Admit: 2021-02-20 | Discharge: 2021-02-20 | Disposition: A | Discharge status: Home / Self Care | Payer: Medicare Other | Source: Ambulatory Visit | Attending: Nurse Practitioner | Admitting: Nurse Practitioner

## 2021-02-20 ENCOUNTER — Other Ambulatory Visit: Payer: Self-pay

## 2021-02-20 ENCOUNTER — Other Ambulatory Visit: Payer: Medicare Other

## 2021-02-20 DIAGNOSIS — R928 Other abnormal and inconclusive findings on diagnostic imaging of breast: Secondary | ICD-10-CM

## 2021-02-20 MED FILL — CITALOPRAM HBR 40 MG TABLET: 40 | 90 days supply | Qty: 90 | Fill #1

## 2021-02-21 ENCOUNTER — Other Ambulatory Visit (HOSPITAL_COMMUNITY): Payer: Self-pay | Admitting: Internal Medicine

## 2021-02-21 MED FILL — ATORVASTATIN 80 MG TABLET: 80 | 90 days supply | Qty: 90 | Fill #0

## 2021-03-10 ENCOUNTER — Other Ambulatory Visit (HOSPITAL_COMMUNITY): Payer: Self-pay

## 2021-03-10 DIAGNOSIS — R1310 Dysphagia, unspecified: Secondary | ICD-10-CM

## 2021-03-24 ENCOUNTER — Ambulatory Visit (HOSPITAL_COMMUNITY)
Admission: RE | Admit: 2021-03-24 | Discharge: 2021-03-24 | Disposition: A | Discharge status: Home / Self Care | Payer: Medicare (Managed Care) | Source: Ambulatory Visit | Attending: Internal Medicine | Admitting: Internal Medicine

## 2021-03-24 ENCOUNTER — Other Ambulatory Visit: Payer: Self-pay

## 2021-03-24 DIAGNOSIS — R1310 Dysphagia, unspecified: Secondary | ICD-10-CM

## 2021-03-24 DIAGNOSIS — G8114 Spastic hemiplegia affecting left nondominant side: Secondary | ICD-10-CM | POA: Insufficient documentation

## 2021-03-24 DIAGNOSIS — I63511 Cerebral infarction due to unspecified occlusion or stenosis of right middle cerebral artery: Secondary | ICD-10-CM | POA: Insufficient documentation

## 2021-03-24 NOTE — Progress Notes (Signed)
Modified Barium Swallow Progress Note  Patient Details  Name: Christina Evans MRN: [id number] Date of Birth: February 26, 1961  Today's Date: 03/24/2021  Modified Barium Swallow completed.  Full report located under Chart Review in the Imaging Section.  Brief recommendations include the following:  Clinical Impression  Pt demonstrates adequate strength with labial, lingual and oropharyngeal musculature. She is able to masticate and form bolus, achieve a lingual sweep independently to clear residue from around teeth and cheeks. Swallow timely with no penetration or aspiration. Esophageal sweep WNL. Given that despite pts normal neuromuscular function pt continues to have choking espisodes with solids, strongly suspect that pts impairment is cognitive. She may be suffereing from attention and awareness impairment common after right CVA. Advised daughter to finely chop all meats, reduce distractions with meals, provide full supervision and assist to decreased bolus size and slow rate with meals as needed. Pt to fully masticate and swallow prior to another bite. Also suggested refitting bottom denture as it is very loose. Pt to continue finely chopped solids and thin liquids.   Swallow Evaluation Recommendations       SLP Diet Recommendations: Dysphagia 2 (Fine chop) solids;Thin liquid   Liquid Administration via: Cup;Straw   Medication Administration: Whole meds with puree   Supervision: Full supervision/cueing for compensatory strategies   Compensations: Slow rate;Small sips/bites;Minimize environmental distractions;Lingual sweep for clearance of pocketing (one bite at a time)   Postural Changes: Seated upright at 90 degrees           Harlon Ditty, MA CCC-SLP  Acute Rehabilitation Services Pager [phone] Office [phone]  Claudine Mouton 03/24/2021,1:45 PM

## 2021-04-27 IMAGING — US US BREAST*R* LIMITED INC AXILLA
1 series · 13 of 13 positions shown · non-contrast
Comparison: Previous exam(s).

CLINICAL DATA: 59-year-old female presenting as a recall from
baseline screening for possible right breast mass and possible
enlarged right axillary lymph nodes.

EXAM:
DIGITAL DIAGNOSTIC UNILATERAL RIGHT MAMMOGRAM WITH TOMOSYNTHESIS AND
CAD; ULTRASOUND RIGHT BREAST LIMITED
TECHNIQUE: Right digital diagnostic mammography and breast tomosynthesis was
performed. The images were evaluated with computer-aided detection.;
Targeted ultrasound examination of the right breast was performed

[Series 1: us breast*right* limited inc axilla · 0.06mm/px · 13 of 13 slices shown]
[im 1/13]
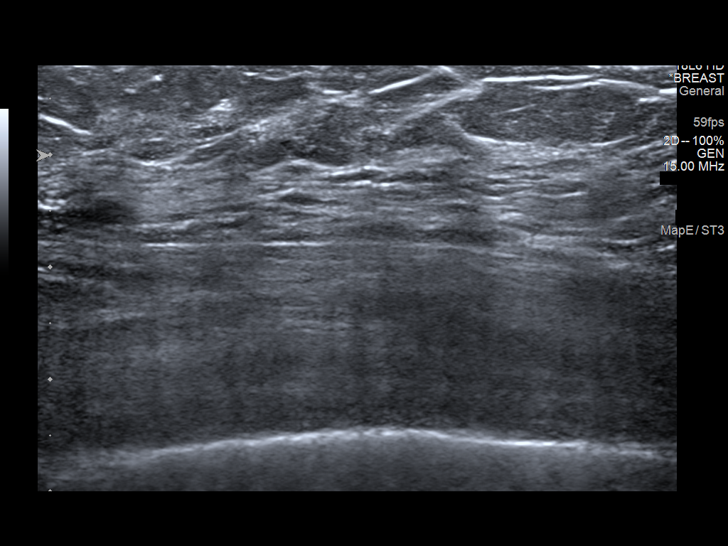
[im 2/13]
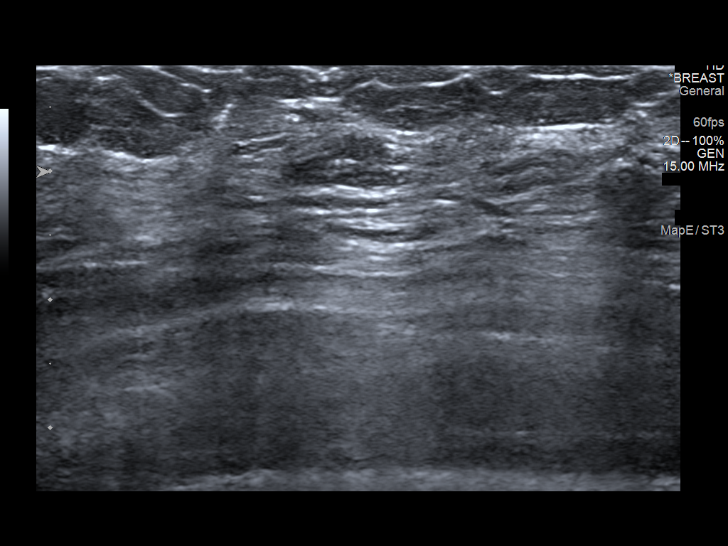
[im 3/13]
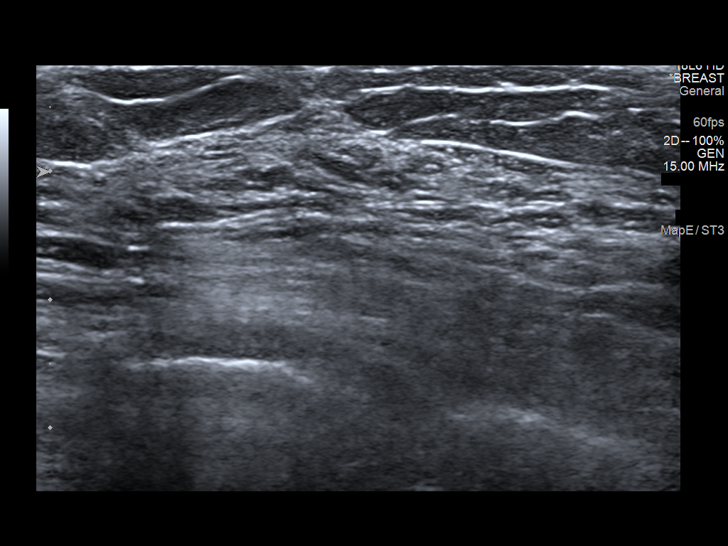
[im 4/13]
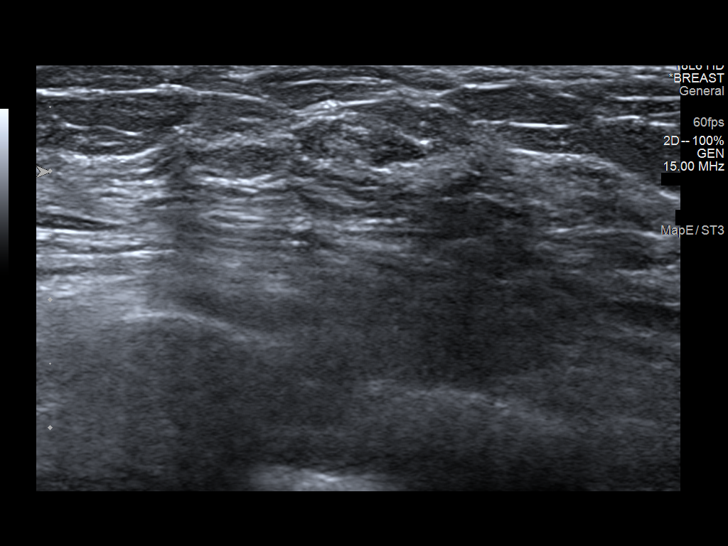
[im 5/13]
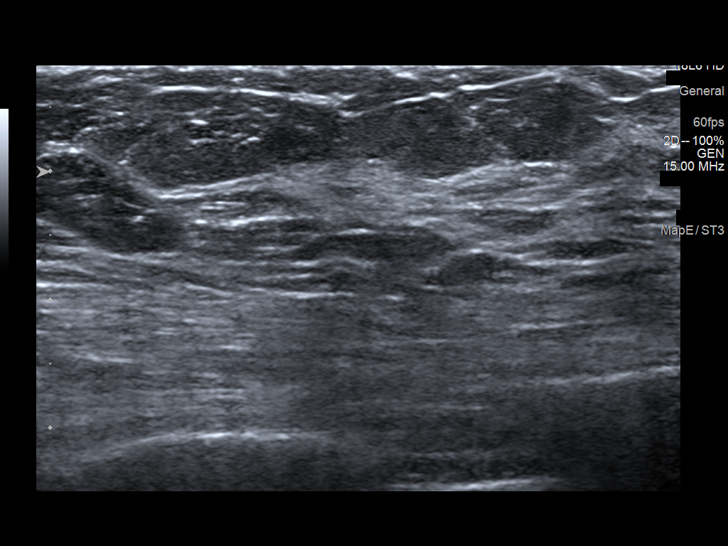
[im 6/13]
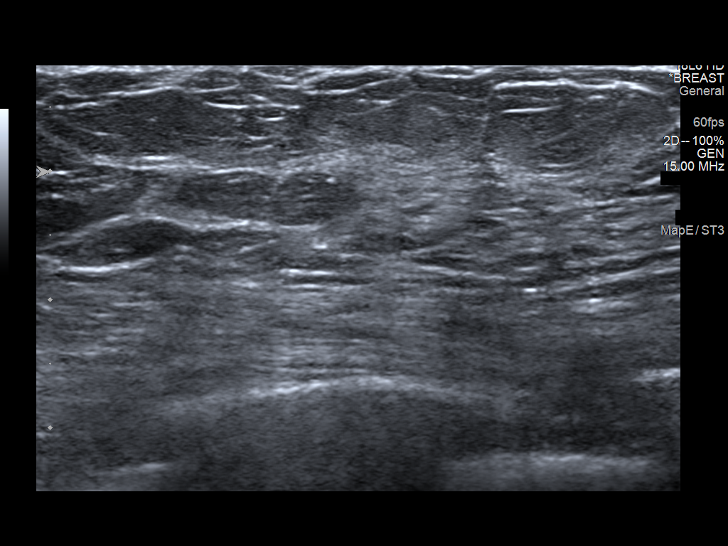
[im 7/13]
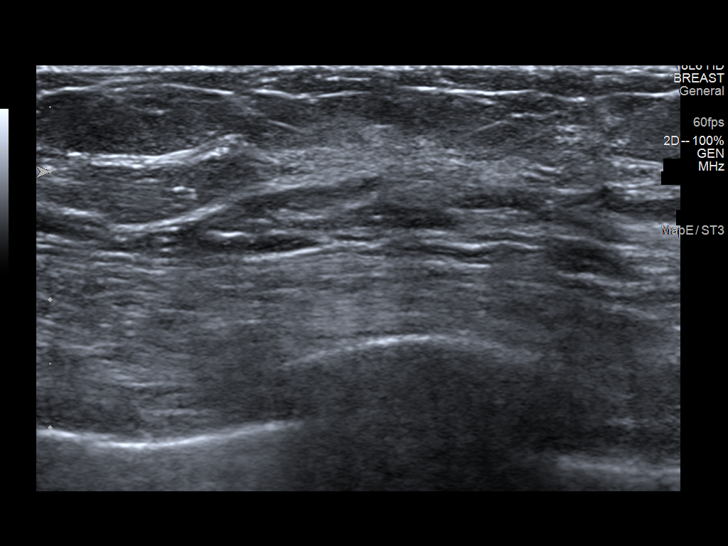
[im 8/13]
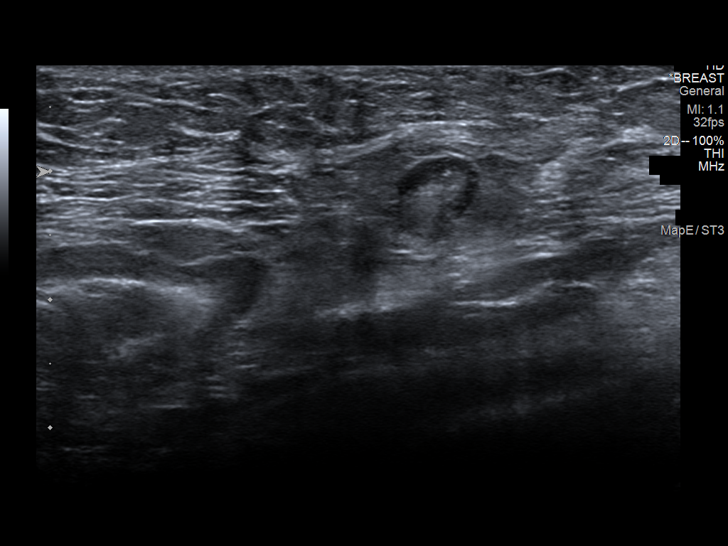
[im 9/13]
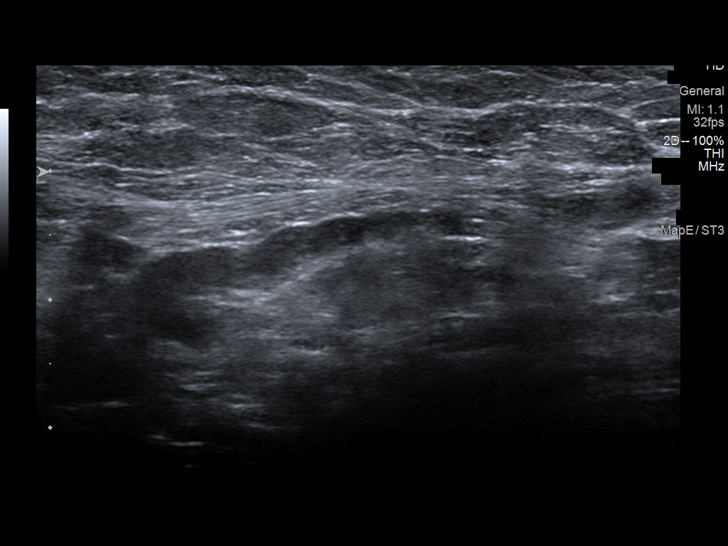
[im 10/13]
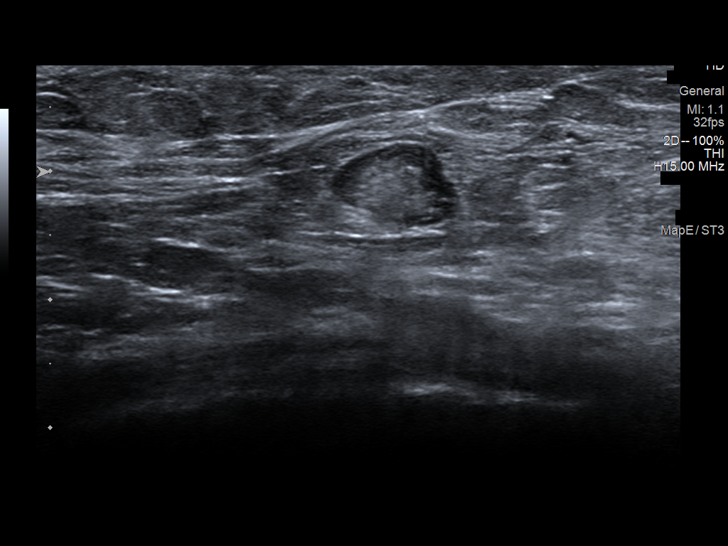
[im 11/13]
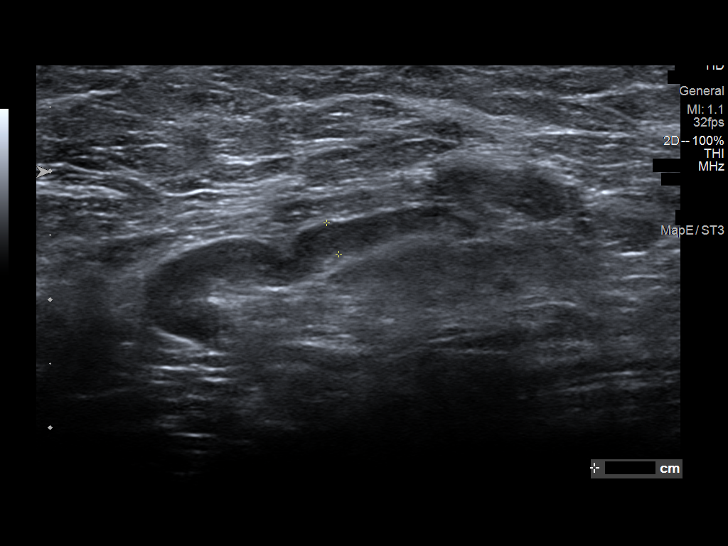
[im 12/13]
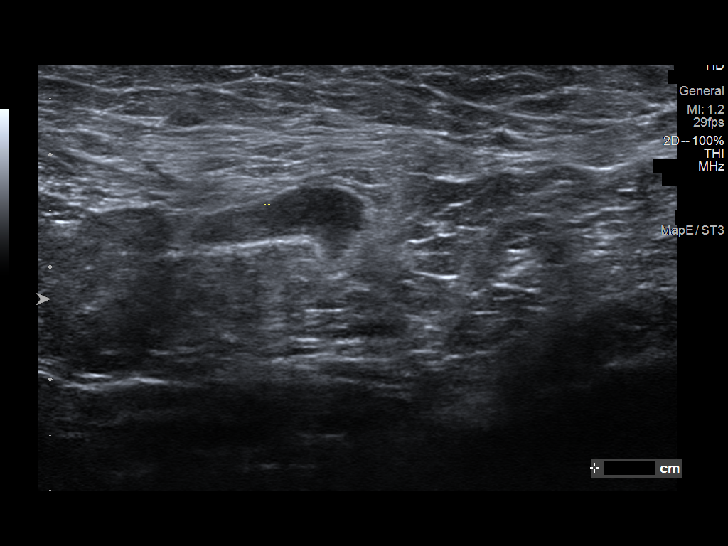
[im 13/13]
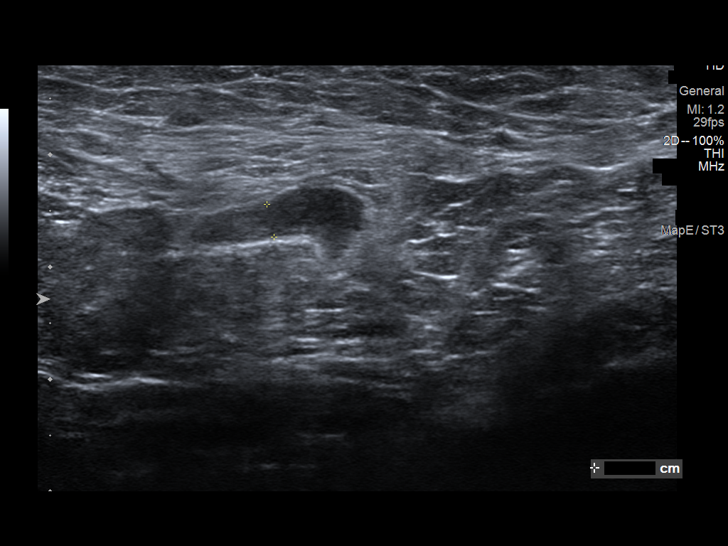

[13 of 13 positions shown; findings below may reference images not displayed]

ACR Breast Density Category b: There are scattered areas of
fibroglandular density.
FINDINGS: Mammogram:

Full field standard, full field mL and spot compression
tomosynthesis views of the right breast were performed. There is no
persistent mass on the additional imaging in the upper outer right
breast. The finding on screening mammogram is most consistent with
normal overlapping fibroglandular tissue. No additional new findings
in the right breast on today's imaging.

Ultrasound:

Targeted ultrasound is performed in the upper outer right breast
demonstrating no cystic or solid mass.

Targeted ultrasound is performed in the right axilla demonstrating
lymph nodes with normal morphology and cortical thickness. Cortical
thickness measures up to 0.3 cm. No suspicious mass or abnormal
lymph node.
IMPRESSION: No mammographic evidence of malignancy in the right breast. Normal
right axillary lymph nodes.

RECOMMENDATION:
Screening mammogram in one year.(Code:MM-H-LNI)

I have discussed the findings and recommendations with the patient.
If applicable, a reminder letter will be sent to the patient
regarding the next appointment.

BI-RADS CATEGORY  1: Negative.

## 2021-08-27 ENCOUNTER — Other Ambulatory Visit: Payer: Self-pay | Admitting: Family Medicine

## 2021-08-27 DIAGNOSIS — Z1231 Encounter for screening mammogram for malignant neoplasm of breast: Secondary | ICD-10-CM

## 2021-09-19 ENCOUNTER — Encounter
Payer: Medicare (Managed Care) | Attending: Physical Medicine & Rehabilitation | Admitting: Physical Medicine & Rehabilitation

## 2021-10-23 NOTE — Progress Notes (Signed)
[facility] Note  10/27/2021     CHIEF COMPLAINT Patient presents for Retina Evaluation   HISTORY OF PRESENT ILLNESS: Christina Evans is a 60 y.o. female who presents to the clinic today for:   HPI     Retina Evaluation   In both eyes.  Associated Symptoms Floaters.  I, the attending physician,  performed the HPI with the patient and updated documentation appropriately.        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 For about a year she has been seeing black spots in vision OD, stationary.  Denies FOLs.  Also, light sensitivity causing eyes to tear states daughter. IDDM since 2008, BS 122 3-4 days ago, A1C unsure       Last edited by Bernarda Caffey, MD on 10/28/2021  4:27 PM.    Pt is here on the referral of Dr. Katy Fitch for cataract clearance and NPDR OU, pt states when she watches TV she sees spots in her vision, pt has hx of strokes, last one 2 years ago, her daughter states her first stroke in 2008 affected her left side and the most recent stroke affected her brain, daughter states she is not independent anymore like she used to be  Referring physician: Warden Fillers, MD [address] [place],  [place] [id number]  HISTORICAL INFORMATION:   Selected notes from the MEDICAL RECORD NUMBER Referred by Dr. Pearlean Brownie for concern of cataract clearance / NPDR LEE:  Ocular Hx- PMH-    CURRENT MEDICATIONS: Current Outpatient Medications (Ophthalmic Drugs)  Medication Sig   Bromfenac Sodium (PROLENSA) 0.07 % SOLN Place 1 drop into the right eye 4 (four) times daily.   prednisoLONE acetate (PRED FORTE) 1 % ophthalmic suspension Place 1 drop into the right eye 4 (four) times daily.   No current facility-administered medications for this visit. (Ophthalmic Drugs)   Current Outpatient Medications (Other)  Medication Sig   acetaminophen (TYLENOL) 650 MG CR tablet Take 650 mg by  mouth every 6 (six) hours as needed for pain.   albuterol (PROAIR HFA) 108 (90 Base) MCG/ACT inhaler Inhale 2 puffs into the lungs 4 (four) times daily as needed for wheezing or shortness of breath.   aspirin 325 MG EC tablet Take 1 tablet (325 mg total) by mouth daily.   atorvastatin (LIPITOR) 80 MG tablet Take 1 tablet (80 mg total) by mouth daily at 6 PM.   citalopram (CELEXA) 40 MG tablet Take 1 tablet (40 mg total) by mouth daily.   clopidogrel (PLAVIX) 75 MG tablet Take 1 tablet (75 mg total) by mouth daily.   insulin aspart (NOVOLOG FLEXPEN) 100 UNIT/ML FlexPen Inject 15 Units into the skin 3 (three) times daily with meals.   insulin lispro (HUMALOG) 100 UNIT/ML injection Inject 0.15 mLs (15 Units total) into the skin 3 (three) times daily before meals.   lisinopril (ZESTRIL) 5 MG tablet Take by mouth.   metFORMIN (GLUCOPHAGE-XR) 500 MG 24 hr tablet Take 2 tablets (1,000 mg total) by mouth 2 (two) times daily.   omega-3 acid ethyl esters (LOVAZA) 1 g capsule Take 1 capsule (1 g total) by mouth 2 (two) times daily.   tiZANidine (ZANAFLEX) 4 MG tablet Take 1 tablet (4 mg total) by mouth at bedtime.   atorvastatin (LIPITOR) 80 MG tablet TAKE 1 TABLET BY MOUTH ONCE DAILY.   atorvastatin (LIPITOR) 80 MG tablet TAKE  1 TABLET BY MOUTH ONCE DAILY.   blood glucose meter kit and supplies Dispense based on patient and insurance preference. Use up to four times daily as directed. (FOR ICD-9 250.00, 250.01).   botulinum toxin Type A (BOTOX) 100 units SOLR injection Inject 600 Units into the muscle every 3 (three) months. (Patient not taking: Reported on 10/27/2021)   citalopram (CELEXA) 40 MG tablet TAKE 1 TABLET BY MOUTH ONCE DAILY.   citalopram (CELEXA) 40 MG tablet TAKE 1 TABLET BY MOUTH ONCE A DAY   clopidogrel (PLAVIX) 75 MG tablet TAKE 1 TABLET BY MOUTH EVERY DAY   glucose blood test strip Use as instructed   guaiFENesin (MUCINEX) 600 MG 12 hr tablet guaifenesin ER 600 mg tablet, extended release  12 hr  Take 1 tablet every 12 hours by oral route for 10 days. (Patient not taking: Reported on 10/27/2021)   IncobotulinumtoxinA (XEOMIN IM) Inject into the muscle every 3 (three) months. (Patient not taking: Reported on 10/27/2021)   Insulin Glargine (LANTUS SOLOSTAR) 100 UNIT/ML Solostar Pen Inject 33 Units into the skin daily. (Patient not taking: Reported on 10/27/2021)   Lancets (ACCU-CHEK SOFT TOUCH) lancets Use as instructed   metFORMIN (GLUCOPHAGE) 1000 MG tablet TAKE 1 TABLET BY MOUTH TWO TIMES DAILY   methylphenidate (RITALIN) 10 MG tablet Take 1 tablet (10 mg total) by mouth in the morning. (Patient not taking: Reported on 10/27/2021)   omeprazole (PRILOSEC) 20 MG capsule Take by mouth. (Patient not taking: Reported on 10/27/2021)   polyethylene glycol powder (GLYCOLAX/MIRALAX) 17 GM/SCOOP powder Miralax 17 gram/dose oral powder  MIX 17 GRAMS IN LIQUID AND DRINK AS NEEDED (Patient not taking: Reported on 10/27/2021)   No current facility-administered medications for this visit. (Other)   REVIEW OF SYSTEMS: ROS   Positive for: Neurological, Musculoskeletal, Endocrine, Eyes, Respiratory, Psychiatric Negative for: Constitutional, Gastrointestinal, Skin, Genitourinary, HENT, Cardiovascular, Allergic/Imm, Heme/Lymph Last edited by Leonie Douglas, COA on 10/27/2021  1:18 PM.     ALLERGIES Allergies  Allergen Reactions   Tape Itching and Other (See Comments)    Causes a lot of scratching, also   PAST MEDICAL HISTORY Past Medical History:  Diagnosis Date   Asthma    CVA (cerebral vascular accident) ([place])    Diabetes mellitus without complication ([place])    Past Surgical History:  Procedure Laterality Date   IR ANGIO INTRA EXTRACRAN SEL COM CAROTID INNOMINATE BILAT MOD SED  09/15/2019   IR ANGIO VERTEBRAL SEL VERTEBRAL UNI L MOD SED  09/15/2019    FAMILY HISTORY Family History  Problem Relation Age of Onset   Hypertension Mother    Hypertension Father     SOCIAL HISTORY Social  History   Tobacco Use   Smoking status: Former   Smokeless tobacco: Never   Tobacco comments:    QUIT IN 2008  Vaping Use   Vaping Use: Never used  Substance Use Topics   Alcohol use: Yes    Comment: RARE   Drug use: No       OPHTHALMIC EXAM:  Base Eye Exam     Visual Acuity (Snellen - Linear)       Right Left   Dist [place] 20/200 +1 20/150 +1   Dist ph [place] 20/60 20/60  Patient lost glasses some time ago.        Pupils       Dark Light Shape React APD   Right 3 2 Round Minimal None   Left 3 2 Round Minimal None  Visual Fields   Poor cooperation, grossly FTFC OD, unable due to looking around OS.        Extraocular Movement       Right Left    Full Full         Neuro/Psych     Oriented x3: Yes   Mood/Affect: Normal         Dilation     Both eyes: 1.0% Mydriacyl, 2.5% Phenylephrine @ 1:50 PM           Slit Lamp and Fundus Exam     Slit Lamp Exam       Right Left   Lids/Lashes Dermatochalasis - upper lid, mild MGD Dermatochalasis - upper lid, mild MGD   Conjunctiva/Sclera temporal pinguecula temporal pinguecula   Cornea trace PEE Clear   Anterior Chamber Deep and quiet Deep and quiet   Iris Round and dilated, No NVI Round and dilated, No NVI   Lens 2+ Nuclear sclerosis, 3+ Cortical cataract 2+ Nuclear sclerosis, 3+ Cortical cataract   Anterior Vitreous Vitreous syneresis Vitreous syneresis         Fundus Exam       Right Left   Disc Trace Pallor, Sharp rim Pink and Sharp   C/D Ratio  0.5   Macula Flat, Blunted foveal reflex, central Cystic changes / edema, scattered MA Flat, Blunted foveal reflex, rare MA   Vessels attenuated, mild tortuousity mild attenuation   Periphery Attached, scattered MA Attached, scattered MA           Refraction     Manifest Refraction       Sphere Cylinder Dist VA   Right -2.25 Sphere 20/50-2   Left -1.50 Sphere 20/60-1            IMAGING AND PROCEDURES  Imaging and Procedures  for 10/27/2021  OCT, Retina - OU - Both Eyes       Right Eye Quality was good. Central Foveal Thickness: 471. Progression has no prior data. Findings include abnormal foveal contour, intraretinal fluid, no SRF, vitreomacular adhesion .   Left Eye Quality was borderline. Central Foveal Thickness: 286. Progression has no prior data. Findings include normal foveal contour, no IRF, no SRF, retinal drusen .   Notes *Images captured and stored on drive  Diagnosis / Impression:  OD: central IRF/edema OS: NFP, no IRF/SRF, +drusen  Clinical management:  See below  Abbreviations: NFP - Normal foveal profile. CME - cystoid macular edema. PED - pigment epithelial detachment. IRF - intraretinal fluid. SRF - subretinal fluid. EZ - ellipsoid zone. ERM - epiretinal membrane. ORA - outer retinal atrophy. ORT - outer retinal tubulation. SRHM - subretinal hyper-reflective material. IRHM - intraretinal hyper-reflective material              ASSESSMENT/PLAN:    ICD-10-CM   1. Retinal edema  H35.81 OCT, Retina - OU - Both Eyes    2. 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 CME (cystoid macular edema), right  H35.351     4. Essential hypertension  I10     5. Hypertensive retinopathy of both eyes  H35.033     6. Combined forms of age-related cataract of both eyes  H25.813      1-3. Macular edema OD  - OCT shows central IRF/edema OD  - BCVA OD 20/60  - differential includes NPDR and CME  - recommend FA today to  help identify etiology of central edema, but patient unable to undergo test today  - will empirically start PF and Prolensa QID OD for possible CME component  - discussed intravitreal injection for possible DME component  - f/u 4 weeks, DFE, OCT, FA (transit OD)  4,5. Hypertensive retinopathy OU - discussed importance of tight BP control - monitor  6. Mixed Cataract OU - The  symptoms of cataract, surgical options, and treatments and risks were discussed with patient. - discussed diagnosis and progression - under the expert management of Dr. Shirleen Schirmer - clear from a retina standpoint to proceed with cataract surgery OS when pt and surgeon are ready - recommend holding off on OD until work up is completed and treatment plan is initiated   Ophthalmic Meds Ordered this visit:  Meds ordered this encounter  Medications   prednisoLONE acetate (PRED FORTE) 1 % ophthalmic suspension    Sig: Place 1 drop into the right eye 4 (four) times daily.    Dispense:  10 mL    Refill:  0   Bromfenac Sodium (PROLENSA) 0.07 % SOLN    Sig: Place 1 drop into the right eye 4 (four) times daily.    Dispense:  3 mL    Refill:  3     Return in about 4 weeks (around 11/24/2021) for f/u macular edema OD, DFE, OCT.  There are no Patient Instructions on file for this visit.   Explained the diagnoses, plan, and follow up with the patient and they expressed understanding.  Patient expressed understanding of the importance of proper follow up care.   This document serves as a record of services personally performed by Gardiner Sleeper, MD, PhD. It was created on their behalf by Roselee Nova, COMT. The creation of this record is the provider's dictation and/or activities during the visit.  Electronically signed by: Roselee Nova, COMT 10/28/21 4:45 PM  This document serves as a record of services personally performed by Gardiner Sleeper, MD, PhD. It was created on their behalf by San Jetty. Owens Shark, OA an ophthalmic technician. The creation of this record is the provider's dictation and/or activities during the visit.    Electronically signed by: San Jetty. Owens Shark, [place] 12.05.2022 4:45 PM  Gardiner Sleeper, M.D., Ph.D. Diseases & Surgery of the Retina and Vitreous [facility]  I have reviewed the above documentation for accuracy and completeness, and I agree with the above.  Gardiner Sleeper, M.D., Ph.D. 10/28/21 4:45 PM   Abbreviations: M myopia (nearsighted); A astigmatism; H hyperopia (farsighted); P presbyopia; Mrx spectacle prescription;  CTL contact lenses; OD right eye; OS left eye; OU both eyes  XT exotropia; ET esotropia; PEK punctate epithelial keratitis; PEE punctate epithelial erosions; DES dry eye syndrome; MGD meibomian gland dysfunction; ATs artificial tears; PFAT's preservative free artificial tears; [place] nuclear sclerotic cataract; PSC posterior subcapsular cataract; ERM epi-retinal membrane; PVD posterior vitreous detachment; RD retinal detachment; DM diabetes mellitus; DR diabetic retinopathy; NPDR non-proliferative diabetic retinopathy; PDR proliferative diabetic retinopathy; CSME clinically significant macular edema; DME diabetic macular edema; dbh dot blot hemorrhages; CWS cotton wool spot; POAG primary open angle glaucoma; C/D cup-to-disc ratio; HVF humphrey visual field; GVF goldmann visual field; OCT optical coherence tomography; IOP intraocular pressure; BRVO Branch retinal vein occlusion; CRVO central retinal vein occlusion; CRAO central retinal artery occlusion; BRAO branch retinal artery occlusion; RT retinal tear; SB scleral buckle; PPV pars plana vitrectomy; VH Vitreous hemorrhage; PRP panretinal laser photocoagulation; IVK intravitreal kenalog;  VMT vitreomacular traction; MH Macular hole;  NVD neovascularization of the disc; NVE neovascularization elsewhere; AREDS age related eye disease study; ARMD age related macular degeneration; POAG primary open angle glaucoma; EBMD epithelial/anterior basement membrane dystrophy; ACIOL anterior chamber intraocular lens; IOL intraocular lens; PCIOL posterior chamber intraocular lens; Phaco/IOL phacoemulsification with intraocular lens placement; [place] photorefractive keratectomy; LASIK laser assisted in situ keratomileusis; HTN hypertension; DM diabetes mellitus; COPD chronic obstructive pulmonary disease

## 2021-10-27 ENCOUNTER — Ambulatory Visit (INDEPENDENT_AMBULATORY_CARE_PROVIDER_SITE_OTHER): Payer: Medicare (Managed Care) | Admitting: Ophthalmology

## 2021-10-27 ENCOUNTER — Other Ambulatory Visit: Payer: Self-pay

## 2021-10-27 ENCOUNTER — Encounter (INDEPENDENT_AMBULATORY_CARE_PROVIDER_SITE_OTHER): Payer: Self-pay | Admitting: Ophthalmology

## 2021-10-27 DIAGNOSIS — I10 Essential (primary) hypertension: Secondary | ICD-10-CM | POA: Diagnosis not present

## 2021-10-27 DIAGNOSIS — H35033 Hypertensive retinopathy, bilateral: Secondary | ICD-10-CM | POA: Diagnosis not present

## 2021-10-27 DIAGNOSIS — H35351 Cystoid macular degeneration, right eye: Secondary | ICD-10-CM | POA: Diagnosis not present

## 2021-10-27 DIAGNOSIS — H3581 Retinal edema: Secondary | ICD-10-CM

## 2021-10-27 DIAGNOSIS — H25813 Combined forms of age-related cataract, bilateral: Secondary | ICD-10-CM

## 2021-10-27 DIAGNOSIS — E113311 Type 2 diabetes mellitus with moderate nonproliferative diabetic retinopathy with macular edema, right eye: Secondary | ICD-10-CM | POA: Diagnosis not present

## 2021-10-27 MED ORDER — PROLENSA 0.07 % OP SOLN
1.0000 [drp] | Freq: Four times a day (QID) | OPHTHALMIC | 3 refills | Status: DC
Start: 2021-10-27 — End: 2022-02-11

## 2021-10-27 MED ORDER — PREDNISOLONE ACETATE 1 % OP SUSP: 1.0000 [drp] | Freq: Four times a day (QID) | OPHTHALMIC | 0 refills | Status: DC

## 2021-10-28 ENCOUNTER — Encounter (INDEPENDENT_AMBULATORY_CARE_PROVIDER_SITE_OTHER): Payer: Self-pay | Admitting: Ophthalmology

## 2021-11-24 ENCOUNTER — Encounter (INDEPENDENT_AMBULATORY_CARE_PROVIDER_SITE_OTHER): Payer: Medicare (Managed Care) | Admitting: Ophthalmology

## 2021-11-25 NOTE — Progress Notes (Signed)
[facility] Note  11/26/2021     CHIEF COMPLAINT Patient presents for Retina Follow Up    HISTORY OF PRESENT ILLNESS: Christina Evans is a 61 y.o. female who presents to the clinic today for:   HPI     Retina Follow Up   Patient presents with  Other.  In right eye.  This started 4 weeks ago.  I, the attending physician,  performed the HPI with the patient and updated documentation appropriately.        Comments   Patient here for 4 weeks retina follow up for mac edema OD. Patient states vision OD sees black stuff spots. Has from the beginning. No eye pain.       Last edited by Bernarda Caffey, MD on 11/26/2021  5:06 PM.      Referring physician: Inc, [address],  [place] [address]  HISTORICAL INFORMATION:   Selected notes from the MEDICAL RECORD NUMBER Referred by Dr. Pearlean Brownie for concern of cataract clearance / NPDR LEE:  Ocular Hx- PMH-    CURRENT MEDICATIONS: Current Outpatient Medications (Ophthalmic Drugs)  Medication Sig   Bromfenac Sodium (PROLENSA) 0.07 % SOLN Place 1 drop into the right eye 4 (four) times daily.   prednisoLONE acetate (PRED FORTE) 1 % ophthalmic suspension Place 1 drop into the right eye 4 (four) times daily.   prednisoLONE acetate (PRED FORTE) 1 % ophthalmic suspension Place 1 drop into the right eye 4 (four) times daily.   No current facility-administered medications for this visit. (Ophthalmic Drugs)   Current Outpatient Medications (Other)  Medication Sig   acetaminophen (TYLENOL) 650 MG CR tablet Take 650 mg by mouth every 6 (six) hours as needed for pain.   albuterol (PROAIR HFA) 108 (90 Base) MCG/ACT inhaler Inhale 2 puffs into the lungs 4 (four) times daily as needed for wheezing or shortness of breath.   aspirin 325 MG EC tablet Take 1 tablet (325 mg total) by mouth daily.   atorvastatin (LIPITOR) 80 MG tablet Take 1 tablet (80 mg total) by mouth  daily at 6 PM.   atorvastatin (LIPITOR) 80 MG tablet TAKE 1 TABLET BY MOUTH ONCE DAILY.   blood glucose meter kit and supplies Dispense based on patient and insurance preference. Use up to four times daily as directed. (FOR ICD-9 250.00, 250.01).   citalopram (CELEXA) 40 MG tablet Take 1 tablet (40 mg total) by mouth daily.   clopidogrel (PLAVIX) 75 MG tablet Take 1 tablet (75 mg total) by mouth daily.   glucose blood test strip Use as instructed   insulin aspart (NOVOLOG FLEXPEN) 100 UNIT/ML FlexPen Inject 15 Units into the skin 3 (three) times daily with meals.   insulin lispro (HUMALOG) 100 UNIT/ML injection Inject 0.15 mLs (15 Units total) into the skin 3 (three) times daily before meals.   Lancets (ACCU-CHEK SOFT TOUCH) lancets Use as instructed   lisinopril (ZESTRIL) 5 MG tablet Take by mouth.   metFORMIN (GLUCOPHAGE-XR) 500 MG 24 hr tablet Take 2 tablets (1,000 mg total) by mouth 2 (two) times daily.   omega-3 acid ethyl esters (LOVAZA) 1 g capsule Take 1 capsule (1 g total) by mouth 2 (two) times daily.   tiZANidine (ZANAFLEX) 4 MG tablet Take 1 tablet (4 mg total) by mouth at bedtime.   atorvastatin (LIPITOR) 80 MG tablet TAKE 1 TABLET BY MOUTH ONCE DAILY.   botulinum toxin Type A (BOTOX) 100  units SOLR injection Inject 600 Units into the muscle every 3 (three) months. (Patient not taking: Reported on 10/27/2021)   citalopram (CELEXA) 40 MG tablet TAKE 1 TABLET BY MOUTH ONCE DAILY.   citalopram (CELEXA) 40 MG tablet TAKE 1 TABLET BY MOUTH ONCE A DAY   clopidogrel (PLAVIX) 75 MG tablet TAKE 1 TABLET BY MOUTH EVERY DAY   guaiFENesin (MUCINEX) 600 MG 12 hr tablet guaifenesin ER 600 mg tablet, extended release 12 hr  Take 1 tablet every 12 hours by oral route for 10 days. (Patient not taking: Reported on 10/27/2021)   IncobotulinumtoxinA (XEOMIN IM) Inject into the muscle every 3 (three) months. (Patient not taking: Reported on 10/27/2021)   Insulin Glargine (LANTUS SOLOSTAR) 100 UNIT/ML  Solostar Pen Inject 33 Units into the skin daily. (Patient not taking: Reported on 10/27/2021)   metFORMIN (GLUCOPHAGE) 1000 MG tablet TAKE 1 TABLET BY MOUTH TWO TIMES DAILY   methylphenidate (RITALIN) 10 MG tablet Take 1 tablet (10 mg total) by mouth in the morning. (Patient not taking: Reported on 10/27/2021)   omeprazole (PRILOSEC) 20 MG capsule Take by mouth. (Patient not taking: Reported on 10/27/2021)   polyethylene glycol powder (GLYCOLAX/MIRALAX) 17 GM/SCOOP powder Miralax 17 gram/dose oral powder  MIX 17 GRAMS IN LIQUID AND DRINK AS NEEDED (Patient not taking: Reported on 10/27/2021)   No current facility-administered medications for this visit. (Other)   REVIEW OF SYSTEMS: ROS   Positive for: Neurological, Musculoskeletal, Endocrine, Eyes, Respiratory, Psychiatric Negative for: Constitutional, Gastrointestinal, Skin, Genitourinary, HENT, Cardiovascular, Allergic/Imm, Heme/Lymph Last edited by Theodore Demark, COA on 11/26/2021  1:45 PM.      ALLERGIES Allergies  Allergen Reactions   Tape Itching and Other (See Comments)    Causes a lot of scratching, also   PAST MEDICAL HISTORY Past Medical History:  Diagnosis Date   Asthma    CVA (cerebral vascular accident) ([place])    Diabetes mellitus without complication ([place])    Past Surgical History:  Procedure Laterality Date   IR ANGIO INTRA EXTRACRAN SEL COM CAROTID INNOMINATE BILAT MOD SED  09/15/2019   IR ANGIO VERTEBRAL SEL VERTEBRAL UNI L MOD SED  09/15/2019    FAMILY HISTORY Family History  Problem Relation Age of Onset   Hypertension Mother    Hypertension Father     SOCIAL HISTORY Social History   Tobacco Use   Smoking status: Former   Smokeless tobacco: Never   Tobacco comments:    QUIT IN 2008  Vaping Use   Vaping Use: Never used  Substance Use Topics   Alcohol use: Yes    Comment: RARE   Drug use: No       OPHTHALMIC EXAM:  Base Eye Exam     Visual Acuity (Snellen - Linear)       Right Left    Dist [place] 20/100 20/100 +1   Dist ph [place] 20/50 -1 20/70 -1         Tonometry   Unable to get pressure. Patient uncooperative. Squeezed real hard.        Pupils       Dark Light Shape React APD   Right 3 2 Round Minimal None   Left 3 2 Round Minimal None         Visual Fields (Counting fingers)       Left Right    Full Full         Extraocular Movement       Right Left    Full,  Ortho Full, Ortho         Neuro/Psych     Oriented x3: Yes   Mood/Affect: Normal         Dilation     Both eyes: 1.0% Mydriacyl, 2.5% Phenylephrine @ 1:40 PM           Slit Lamp and Fundus Exam     Slit Lamp Exam       Right Left   Lids/Lashes Dermatochalasis - upper lid, mild MGD Dermatochalasis - upper lid, mild MGD   Conjunctiva/Sclera temporal pinguecula temporal pinguecula   Cornea trace PEE Clear   Anterior Chamber Deep and quiet Deep and quiet   Iris Round and dilated, No NVI Round and dilated, No NVI   Lens 2+ Nuclear sclerosis, 3+ Cortical cataract 2+ Nuclear sclerosis, 3+ Cortical cataract   Anterior Vitreous Vitreous syneresis Vitreous syneresis         Fundus Exam       Right Left   Disc Trace Pallor, Sharp rim Pink and Sharp   C/D Ratio 0.6 0.5   Macula Flat, Blunted foveal reflex, central Cystic changes / edema--slightly improved, scattered MA Flat, Blunted foveal reflex, rare MA   Vessels attenuated, mild tortuousity mild attenuation   Periphery Attached, scattered MA Attached, scattered MA            IMAGING AND PROCEDURES  Imaging and Procedures for 11/26/2021  OCT, Retina - OU - Both Eyes       Right Eye Quality was good. Central Foveal Thickness: 441. Progression has improved. Findings include abnormal foveal contour, intraretinal fluid, no SRF, vitreomacular adhesion (Mild Interval decrease in central IRF).   Left Eye Quality was borderline. Central Foveal Thickness: 289. Progression has been stable. Findings include normal foveal  contour, no IRF, no SRF, retinal drusen .   Notes *Images captured and stored on drive  Diagnosis / Impression:  OD: interval decrease in central IRF/edema OS: NFP, no IRF/SRF, +drusen  Clinical management:  See below  Abbreviations: NFP - Normal foveal profile. CME - cystoid macular edema. PED - pigment epithelial detachment. IRF - intraretinal fluid. SRF - subretinal fluid. EZ - ellipsoid zone. ERM - epiretinal membrane. ORA - outer retinal atrophy. ORT - outer retinal tubulation. SRHM - subretinal hyper-reflective material. IRHM - intraretinal hyper-reflective material      Fluorescein Angiography Optos (Transit OD)       Right Eye Progression has no prior data. Early phase findings include microaneurysm, leakage. Mid/Late phase findings include staining (Perifoveal petaloid leakage).   Left Eye Progression has no prior data. Early phase findings include leakage, microaneurysm. Mid/Late phase findings include microaneurysm, leakage (Mild leaking MA's).   Notes Images stored on drive;   Impression: Moderate NPDR OU -- leaking MA OU OD: Perifoveal petaloid leakage--consistent w/ CME                ASSESSMENT/PLAN:    ICD-10-CM   1. CME (cystoid macular edema), right  H35.351 OCT, Retina - OU - Both Eyes    Fluorescein Angiography Optos (Transit OD)    prednisoLONE acetate (PRED FORTE) 1 % ophthalmic suspension    2. 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 Essential hypertension  I10     4. Hypertensive retinopathy of both eyes  H35.033 Fluorescein Angiography Optos (Transit OD)    5. Combined forms of age-related cataract  of both eyes  H25.813       1. CME OD  - OCT shows central IRF/edema OD--slightly improved  - BCVA OD 20/50 -- improved from 20/60  - FA shows petaloid leakage consistent with CME OD + mild leaking MA  OU  - will continue PF and Prolensa QID OD for CME component  - discussed possibility of periocular and intravitreal steroids if CME fails to improve / resolve  - discussed intravitreal injection for possible DME component, see below  - f/u 4-6 weeks, DFE, OCT  2. Moderate nonproliferative diabetic retinopathy OU  - mild cystic changes / DME OU - The incidence, risk factors for progression, natural history and treatment options for diabetic retinopathy were discussed with patient.   - The need for close monitoring of blood glucose, blood pressure, and serum lipids, avoiding cigarette or any type of tobacco, and the need for long term follow up was also discussed with patient. - exam with scattered MA OU  - FA shows mild late leaking MA OU - OCT w/ macular edema OD (likely combination of CME and DME); OS with mild cystic changes - discussed possible treatments for DME - f/u in 4-6 wks -- DFE/OCT  3,4. Hypertensive retinopathy OU - discussed importance of tight BP control - monitor  5. Mixed Cataract OU - The symptoms of cataract, surgical options, and treatments and risks were discussed with patient. - discussed diagnosis and progression - under the expert management of Dr. Shirleen Schirmer - clear from a retina standpoint to proceed with cataract surgery OS when pt and surgeon are ready - recommend holding off on OD until work up is completed and treatment plan is initiated  Ophthalmic Meds Ordered this visit:  Meds ordered this encounter  Medications   prednisoLONE acetate (PRED FORTE) 1 % ophthalmic suspension    Sig: Place 1 drop into the right eye 4 (four) times daily.    Dispense:  10 mL    Refill:  2     Return 4-6 weeks, for DFE, OCT.  There are no Patient Instructions on file for this visit.   Explained the diagnoses, plan, and follow up with the patient and they expressed understanding.  Patient expressed understanding of the importance of proper follow up care.   This  document serves as a record of services personally performed by Gardiner Sleeper, MD, PhD. It was created on their behalf by Orvan Falconer, an ophthalmic technician. The creation of this record is the provider's dictation and/or activities during the visit.    Electronically signed by: Orvan Falconer, OA, 11/26/21  5:15 PM  This document serves as a record of services personally performed by Gardiner Sleeper, MD, PhD. It was created on their behalf by Roselee Nova, COMT. The creation of this record is the provider's dictation and/or activities during the visit.  Electronically signed by: Roselee Nova, COMT 11/26/21 5:15 PM  Gardiner Sleeper, M.D., Ph.D. Diseases & Surgery of the Retina and Vitreous [facility]  I have reviewed the above documentation for accuracy and completeness, and I agree with the above. Gardiner Sleeper, M.D., Ph.D. 11/26/21 5:15 PM   Abbreviations: M myopia (nearsighted); A astigmatism; H hyperopia (farsighted); P presbyopia; Mrx spectacle prescription;  CTL contact lenses; OD right eye; OS left eye; OU both eyes  XT exotropia; ET esotropia; PEK punctate epithelial keratitis; PEE punctate epithelial erosions; DES dry eye syndrome; MGD meibomian gland dysfunction; ATs artificial tears; PFAT's preservative free  artificial tears; [place] nuclear sclerotic cataract; PSC posterior subcapsular cataract; ERM epi-retinal membrane; PVD posterior vitreous detachment; RD retinal detachment; DM diabetes mellitus; DR diabetic retinopathy; NPDR non-proliferative diabetic retinopathy; PDR proliferative diabetic retinopathy; CSME clinically significant macular edema; DME diabetic macular edema; dbh dot blot hemorrhages; CWS cotton wool spot; POAG primary open angle glaucoma; C/D cup-to-disc ratio; HVF humphrey visual field; GVF goldmann visual field; OCT optical coherence tomography; IOP intraocular pressure; BRVO Branch retinal vein occlusion; CRVO central retinal vein  occlusion; CRAO central retinal artery occlusion; BRAO branch retinal artery occlusion; RT retinal tear; SB scleral buckle; PPV pars plana vitrectomy; VH Vitreous hemorrhage; PRP panretinal laser photocoagulation; IVK intravitreal kenalog; VMT vitreomacular traction; MH Macular hole;  NVD neovascularization of the disc; NVE neovascularization elsewhere; AREDS age related eye disease study; ARMD age related macular degeneration; POAG primary open angle glaucoma; EBMD epithelial/anterior basement membrane dystrophy; ACIOL anterior chamber intraocular lens; IOL intraocular lens; PCIOL posterior chamber intraocular lens; Phaco/IOL phacoemulsification with intraocular lens placement; [place] photorefractive keratectomy; LASIK laser assisted in situ keratomileusis; HTN hypertension; DM diabetes mellitus; COPD chronic obstructive pulmonary disease

## 2021-11-26 ENCOUNTER — Other Ambulatory Visit: Payer: Self-pay

## 2021-11-26 ENCOUNTER — Encounter (INDEPENDENT_AMBULATORY_CARE_PROVIDER_SITE_OTHER): Payer: Self-pay | Admitting: Ophthalmology

## 2021-11-26 ENCOUNTER — Ambulatory Visit (INDEPENDENT_AMBULATORY_CARE_PROVIDER_SITE_OTHER): Payer: Medicare (Managed Care) | Admitting: Ophthalmology

## 2021-11-26 DIAGNOSIS — I10 Essential (primary) hypertension: Secondary | ICD-10-CM

## 2021-11-26 DIAGNOSIS — E113311 Type 2 diabetes mellitus with moderate nonproliferative diabetic retinopathy with macular edema, right eye: Secondary | ICD-10-CM | POA: Diagnosis not present

## 2021-11-26 DIAGNOSIS — H35033 Hypertensive retinopathy, bilateral: Secondary | ICD-10-CM | POA: Diagnosis not present

## 2021-11-26 DIAGNOSIS — H3581 Retinal edema: Secondary | ICD-10-CM

## 2021-11-26 DIAGNOSIS — H35351 Cystoid macular degeneration, right eye: Secondary | ICD-10-CM | POA: Diagnosis not present

## 2021-11-26 DIAGNOSIS — H25813 Combined forms of age-related cataract, bilateral: Secondary | ICD-10-CM

## 2021-11-26 MED ORDER — PREDNISOLONE ACETATE 1 % OP SUSP: 1.0000 [drp] | Freq: Four times a day (QID) | OPHTHALMIC | 2 refills | Status: DC

## 2021-12-22 NOTE — Progress Notes (Signed)
[facility] Note  12/24/2021     CHIEF COMPLAINT Patient presents for Retina Follow Up    HISTORY OF PRESENT ILLNESS: Christina Evans is a 61 y.o. female who presents to the clinic today for:   HPI     Retina Follow Up   Patient presents with  Other.  In right eye.  Severity is moderate.  Duration of 4 weeks.  Since onset it is stable.  I, the attending physician,  performed the HPI with the patient and updated documentation appropriately.        Comments   Pt here for 4 wk ret f/u CME OD. Pt states she feels she can see better out of OD but reports discontinuing gtts. Pt states her daughter received a phone call that she could stop taking the gtts.       Last edited by Bernarda Caffey, MD on 12/29/2021 12:07 AM.    Pt states she never used the drops that were prescribed to her, bc she doesn't like putting drops in her eyes   Referring physician: [facility], [address],  [place] [address]  HISTORICAL INFORMATION:   Selected notes from the [facility] Referred by Dr. Pearlean Brownie for concern of cataract clearance / NPDR LEE:  Ocular Hx- PMH-    CURRENT MEDICATIONS: Current Outpatient Medications (Ophthalmic Drugs)  Medication Sig   Bromfenac Sodium (PROLENSA) 0.07 % SOLN Place 1 drop into the right eye 4 (four) times daily.   prednisoLONE acetate (PRED FORTE) 1 % ophthalmic suspension Place 1 drop into the right eye 4 (four) times daily. (Patient not taking: Reported on 12/24/2021)   prednisoLONE acetate (PRED FORTE) 1 % ophthalmic suspension Place 1 drop into the right eye 4 (four) times daily. (Patient not taking: Reported on 12/24/2021)   No current facility-administered medications for this visit. (Ophthalmic Drugs)   Current Outpatient Medications (Other)  Medication Sig   acetaminophen (TYLENOL) 650 MG CR tablet Take 650 mg by mouth every 6 (six) hours as needed for pain.    albuterol (PROAIR HFA) 108 (90 Base) MCG/ACT inhaler Inhale 2 puffs into the lungs 4 (four) times daily as needed for wheezing or shortness of breath.   aspirin 325 MG EC tablet Take 1 tablet (325 mg total) by mouth daily.   atorvastatin (LIPITOR) 80 MG tablet Take 1 tablet (80 mg total) by mouth daily at 6 PM.   atorvastatin (LIPITOR) 80 MG tablet TAKE 1 TABLET BY MOUTH ONCE DAILY.   blood glucose meter kit and supplies Dispense based on patient and insurance preference. Use up to four times daily as directed. (FOR ICD-9 250.00, 250.01).   citalopram (CELEXA) 40 MG tablet Take 1 tablet (40 mg total) by mouth daily.   clopidogrel (PLAVIX) 75 MG tablet Take 1 tablet (75 mg total) by mouth daily.   glucose blood test strip Use as instructed   guaiFENesin (MUCINEX) 600 MG 12 hr tablet    insulin lispro (HUMALOG) 100 UNIT/ML injection Inject 0.15 mLs (15 Units total) into the skin 3 (three) times daily before meals.   Lancets (ACCU-CHEK SOFT TOUCH) lancets Use as instructed   lisinopril (ZESTRIL) 5 MG tablet Take by mouth.   metFORMIN (GLUCOPHAGE-XR) 500 MG 24 hr tablet Take 2 tablets (1,000 mg total) by mouth 2 (two) times daily.   omega-3 acid ethyl esters (LOVAZA) 1 g capsule Take 1 capsule (1 g total) by mouth  2 (two) times daily.   tiZANidine (ZANAFLEX) 4 MG tablet Take 1 tablet (4 mg total) by mouth at bedtime.   atorvastatin (LIPITOR) 80 MG tablet TAKE 1 TABLET BY MOUTH ONCE DAILY.   botulinum toxin Type A (BOTOX) 100 units SOLR injection Inject 600 Units into the muscle every 3 (three) months. (Patient not taking: Reported on 12/24/2021)   citalopram (CELEXA) 40 MG tablet TAKE 1 TABLET BY MOUTH ONCE DAILY.   citalopram (CELEXA) 40 MG tablet TAKE 1 TABLET BY MOUTH ONCE A DAY   clopidogrel (PLAVIX) 75 MG tablet TAKE 1 TABLET BY MOUTH EVERY DAY   IncobotulinumtoxinA (XEOMIN IM) Inject into the muscle every 3 (three) months. (Patient not taking: Reported on 10/27/2021)   insulin aspart (NOVOLOG  FLEXPEN) 100 UNIT/ML FlexPen Inject 15 Units into the skin 3 (three) times daily with meals. (Patient not taking: Reported on 12/24/2021)   Insulin Glargine (LANTUS SOLOSTAR) 100 UNIT/ML Solostar Pen Inject 33 Units into the skin daily. (Patient not taking: Reported on 10/27/2021)   metFORMIN (GLUCOPHAGE) 1000 MG tablet TAKE 1 TABLET BY MOUTH TWO TIMES DAILY   methylphenidate (RITALIN) 10 MG tablet Take 1 tablet (10 mg total) by mouth in the morning. (Patient not taking: Reported on 10/27/2021)   omeprazole (PRILOSEC) 20 MG capsule Take by mouth. (Patient not taking: Reported on 10/27/2021)   polyethylene glycol powder (GLYCOLAX/MIRALAX) 17 GM/SCOOP powder Miralax 17 gram/dose oral powder  MIX 17 GRAMS IN LIQUID AND DRINK AS NEEDED (Patient not taking: Reported on 10/27/2021)   No current facility-administered medications for this visit. (Other)   REVIEW OF SYSTEMS: ROS   Positive for: Neurological, Musculoskeletal, Endocrine, Eyes, Respiratory, Psychiatric Negative for: Constitutional, Gastrointestinal, Skin, Genitourinary, HENT, Cardiovascular, Allergic/Imm, Heme/Lymph Last edited by Kingsley Spittle, COT on 12/24/2021  2:19 PM.     ALLERGIES Allergies  Allergen Reactions   Tape Itching and Other (See Comments)    Causes a lot of scratching, also   PAST MEDICAL HISTORY Past Medical History:  Diagnosis Date   Asthma    CVA (cerebral vascular accident) ([place])    Diabetes mellitus without complication ([place])    Past Surgical History:  Procedure Laterality Date   IR ANGIO INTRA EXTRACRAN SEL COM CAROTID INNOMINATE BILAT MOD SED  09/15/2019   IR ANGIO VERTEBRAL SEL VERTEBRAL UNI L MOD SED  09/15/2019    FAMILY HISTORY Family History  Problem Relation Age of Onset   Hypertension Mother    Hypertension Father     SOCIAL HISTORY Social History   Tobacco Use   Smoking status: Former   Smokeless tobacco: Never   Tobacco comments:    QUIT IN 2008  Vaping Use   Vaping Use: Never  used  Substance Use Topics   Alcohol use: Yes    Comment: RARE   Drug use: No       OPHTHALMIC EXAM:  Base Eye Exam     Visual Acuity (Snellen - Linear)       Right Left   Dist [place] 20/100 20/40   Dist ph  Bend 20/70 NI         Tonometry     Unable to assess: Yes         Pupils       Dark Light Shape React APD   Right 3 2 Round Minimal None   Left 3 2 Round Minimal None         Visual Fields (Counting fingers)       Left Right  Full Full         Extraocular Movement       Right Left    Full, Ortho Full, Ortho         Neuro/Psych     Oriented x3: Yes   Mood/Affect: Normal         Dilation     Both eyes: 1.0% Mydriacyl, 2.5% Phenylephrine @ 2:42 PM           Slit Lamp and Fundus Exam     Slit Lamp Exam       Right Left   Lids/Lashes Dermatochalasis - upper lid, mild MGD Dermatochalasis - upper lid, mild MGD   Conjunctiva/Sclera temporal pinguecula temporal pinguecula   Cornea trace PEE Clear   Anterior Chamber Deep and quiet Deep and quiet   Iris Round and dilated, No NVI Round and dilated, No NVI   Lens 2+ Nuclear sclerosis, 3+ Cortical cataract 2+ Nuclear sclerosis, 3+ Cortical cataract   Anterior Vitreous Vitreous syneresis Vitreous syneresis         Fundus Exam       Right Left   Disc Trace Pallor, Sharp rim Pink and Sharp   C/D Ratio 0.6 0.5   Macula Flat, Blunted foveal reflex, central Cystic changes / edema -- slightly increased, scattered MA Flat, good foveal reflex, interval development of mild central edema, scattered MA   Vessels attenuated, mild tortuousity mild attenuation   Periphery Attached, scattered MA Attached, scattered MA           Refraction     Manifest Refraction       Sphere Cylinder Dist VA   Right -2.25 Sphere 20/70+2   Left               IMAGING AND PROCEDURES  Imaging and Procedures for 12/24/2021  OCT, Retina - OU - Both Eyes       Right Eye Quality was good. Central Foveal  Thickness: 457. Progression has worsened. Findings include abnormal foveal contour, intraretinal fluid, no SRF, vitreomacular adhesion (Mild Interval increase in central IRF).   Left Eye Quality was borderline. Central Foveal Thickness: 373. Progression has worsened. Findings include no SRF, intraretinal fluid, intraretinal hyper-reflective material, abnormal foveal contour, retinal drusen (Interval development of IRF/DME).   Notes *Images captured and stored on drive  Diagnosis / Impression:  OD: mild interval increase in central IRF/edema OS: Interval development of IRF/DME  Clinical management:  See below  Abbreviations: NFP - Normal foveal profile. CME - cystoid macular edema. PED - pigment epithelial detachment. IRF - intraretinal fluid. SRF - subretinal fluid. EZ - ellipsoid zone. ERM - epiretinal membrane. ORA - outer retinal atrophy. ORT - outer retinal tubulation. SRHM - subretinal hyper-reflective material. IRHM - intraretinal hyper-reflective material            ASSESSMENT/PLAN:    ICD-10-CM   1. CME (cystoid macular edema), right  H35.351 OCT, Retina - OU - Both Eyes    2. 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 Essential hypertension  I10     4. Hypertensive retinopathy of both eyes  H35.033     5. Combined forms of age-related cataract of both eyes  H25.813       1. CME OD  - OCT shows central IRF/edema OD -- mild increase  - BCVA OD 20/70 from 20/50  - FA (1.4.23) shows petaloid leakage consistent with  CME OD + mild leaking MA OU  - will continue PF and Prolensa QID OD for CME component -- pt has not been using drops  - discussed importance of using PF and Prolensa - pt stated she would "try" to use them  - discussed possibility of periocular and intravitreal steroids if CME fails to improve / resolve  - recommend intravitreal injection for possible DME  component, but pt refused  - f/u 4-6 wks for DFE, OCT  2. Moderate nonproliferative diabetic retinopathy OU  - mild cystic changes / DME OU - exam with scattered MA OU - FA shows mild late leaking MA OU - OCT OD: mild interval increase in central IRF/edema; OS: Interval development of IRF/DME - recommend IVA today, but pt refused injection - f/u in 4-6 wks -- DFE/OCT  3,4. Hypertensive retinopathy OU - discussed importance of tight BP control - monitor  5. Mixed Cataract OU - The symptoms of cataract, surgical options, and treatments and risks were discussed with patient. - discussed diagnosis and progression - under the expert management of Dr. Shirleen Schirmer - clear from a retina standpoint to proceed with cataract surgery OS when pt and surgeon are ready - recommend holding off on OD until work up is completed and treatment plan is initiated  Ophthalmic Meds Ordered this visit:  No orders of the defined types were placed in this encounter.    Return for f/u 4-6 weeks, CME OD / NPDR OU, DFE, OCT.  There are no Patient Instructions on file for this visit.   Explained the diagnoses, plan, and follow up with the patient and they expressed understanding.  Patient expressed understanding of the importance of proper follow up care.   This document serves as a record of services personally performed by Gardiner Sleeper, MD, PhD. It was created on their behalf by Orvan Falconer, an ophthalmic technician. The creation of this record is the provider's dictation and/or activities during the visit.    Electronically signed by: Orvan Falconer, OA, 12/29/21  12:07 AM  This document serves as a record of services personally performed by Gardiner Sleeper, MD, PhD. It was created on their behalf by San Jetty. Owens Shark, OA an ophthalmic technician. The creation of this record is the provider's dictation and/or activities during the visit.    Electronically signed by: San Jetty. Owens Shark, [place] 02.01.2023  12:07 AM   Gardiner Sleeper, M.D., Ph.D. Diseases & Surgery of the Retina and Vitreous [facility]  I have reviewed the above documentation for accuracy and completeness, and I agree with the above. Gardiner Sleeper, M.D., Ph.D. 12/29/21 12:19 AM   Abbreviations: M myopia (nearsighted); A astigmatism; H hyperopia (farsighted); P presbyopia; Mrx spectacle prescription;  CTL contact lenses; OD right eye; OS left eye; OU both eyes  XT exotropia; ET esotropia; PEK punctate epithelial keratitis; PEE punctate epithelial erosions; DES dry eye syndrome; MGD meibomian gland dysfunction; ATs artificial tears; PFAT's preservative free artificial tears; [place] nuclear sclerotic cataract; PSC posterior subcapsular cataract; ERM epi-retinal membrane; PVD posterior vitreous detachment; RD retinal detachment; DM diabetes mellitus; DR diabetic retinopathy; NPDR non-proliferative diabetic retinopathy; PDR proliferative diabetic retinopathy; CSME clinically significant macular edema; DME diabetic macular edema; dbh dot blot hemorrhages; CWS cotton wool spot; POAG primary open angle glaucoma; C/D cup-to-disc ratio; HVF humphrey visual field; GVF goldmann visual field; OCT optical coherence tomography; IOP intraocular pressure; BRVO Branch retinal vein occlusion; CRVO central retinal vein occlusion; CRAO central retinal artery occlusion;  BRAO branch retinal artery occlusion; RT retinal tear; SB scleral buckle; PPV pars plana vitrectomy; VH Vitreous hemorrhage; PRP panretinal laser photocoagulation; IVK intravitreal kenalog; VMT vitreomacular traction; MH Macular hole;  NVD neovascularization of the disc; NVE neovascularization elsewhere; AREDS age related eye disease study; ARMD age related macular degeneration; POAG primary open angle glaucoma; EBMD epithelial/anterior basement membrane dystrophy; ACIOL anterior chamber intraocular lens; IOL intraocular lens; PCIOL posterior chamber intraocular lens;  Phaco/IOL phacoemulsification with intraocular lens placement; [place] photorefractive keratectomy; LASIK laser assisted in situ keratomileusis; HTN hypertension; DM diabetes mellitus; COPD chronic obstructive pulmonary disease

## 2021-12-24 ENCOUNTER — Other Ambulatory Visit: Payer: Self-pay

## 2021-12-24 ENCOUNTER — Ambulatory Visit (INDEPENDENT_AMBULATORY_CARE_PROVIDER_SITE_OTHER): Payer: Medicare (Managed Care) | Admitting: Ophthalmology

## 2021-12-24 DIAGNOSIS — H35351 Cystoid macular degeneration, right eye: Secondary | ICD-10-CM | POA: Diagnosis not present

## 2021-12-24 DIAGNOSIS — I10 Essential (primary) hypertension: Secondary | ICD-10-CM | POA: Diagnosis not present

## 2021-12-24 DIAGNOSIS — E113313 Type 2 diabetes mellitus with moderate nonproliferative diabetic retinopathy with macular edema, bilateral: Secondary | ICD-10-CM | POA: Diagnosis not present

## 2021-12-24 DIAGNOSIS — E113311 Type 2 diabetes mellitus with moderate nonproliferative diabetic retinopathy with macular edema, right eye: Secondary | ICD-10-CM

## 2021-12-24 DIAGNOSIS — H35033 Hypertensive retinopathy, bilateral: Secondary | ICD-10-CM | POA: Diagnosis not present

## 2021-12-24 DIAGNOSIS — H25813 Combined forms of age-related cataract, bilateral: Secondary | ICD-10-CM

## 2021-12-29 ENCOUNTER — Encounter (INDEPENDENT_AMBULATORY_CARE_PROVIDER_SITE_OTHER): Payer: Self-pay | Admitting: Ophthalmology

## 2022-01-19 NOTE — Progress Notes (Shared)
[facility] Note  01/21/2022     CHIEF COMPLAINT Patient presents for No chief complaint on file.    HISTORY OF PRESENT ILLNESS: Christina Evans is a 61 y.o. female who presents to the clinic today for:     Referring physician: [facility], [address] [place],  [place] [address]  HISTORICAL INFORMATION:   Selected notes from the MEDICAL RECORD NUMBER Referred by Dr. Pearlean Brownie for concern of cataract clearance / NPDR LEE:  Ocular Hx- PMH-    CURRENT MEDICATIONS: Current Outpatient Medications (Ophthalmic Drugs)  Medication Sig   Bromfenac Sodium (PROLENSA) 0.07 % SOLN Place 1 drop into the right eye 4 (four) times daily.   prednisoLONE acetate (PRED FORTE) 1 % ophthalmic suspension Place 1 drop into the right eye 4 (four) times daily. (Patient not taking: Reported on 12/24/2021)   prednisoLONE acetate (PRED FORTE) 1 % ophthalmic suspension Place 1 drop into the right eye 4 (four) times daily. (Patient not taking: Reported on 12/24/2021)   No current facility-administered medications for this visit. (Ophthalmic Drugs)   Current Outpatient Medications (Other)  Medication Sig   acetaminophen (TYLENOL) 650 MG CR tablet Take 650 mg by mouth every 6 (six) hours as needed for pain.   albuterol (PROAIR HFA) 108 (90 Base) MCG/ACT inhaler Inhale 2 puffs into the lungs 4 (four) times daily as needed for wheezing or shortness of breath.   aspirin 325 MG EC tablet Take 1 tablet (325 mg total) by mouth daily.   atorvastatin (LIPITOR) 80 MG tablet Take 1 tablet (80 mg total) by mouth daily at 6 PM.   atorvastatin (LIPITOR) 80 MG tablet TAKE 1 TABLET BY MOUTH ONCE DAILY.   atorvastatin (LIPITOR) 80 MG tablet TAKE 1 TABLET BY MOUTH ONCE DAILY.   blood glucose meter kit and supplies Dispense based on patient and insurance preference. Use up to four times daily as directed. (FOR ICD-9 250.00, 250.01).   botulinum toxin Type A  (BOTOX) 100 units SOLR injection Inject 600 Units into the muscle every 3 (three) months. (Patient not taking: Reported on 12/24/2021)   citalopram (CELEXA) 40 MG tablet Take 1 tablet (40 mg total) by mouth daily.   citalopram (CELEXA) 40 MG tablet TAKE 1 TABLET BY MOUTH ONCE DAILY.   citalopram (CELEXA) 40 MG tablet TAKE 1 TABLET BY MOUTH ONCE A DAY   clopidogrel (PLAVIX) 75 MG tablet Take 1 tablet (75 mg total) by mouth daily.   clopidogrel (PLAVIX) 75 MG tablet TAKE 1 TABLET BY MOUTH EVERY DAY   glucose blood test strip Use as instructed   guaiFENesin (MUCINEX) 600 MG 12 hr tablet    IncobotulinumtoxinA (XEOMIN IM) Inject into the muscle every 3 (three) months. (Patient not taking: Reported on 10/27/2021)   insulin aspart (NOVOLOG FLEXPEN) 100 UNIT/ML FlexPen Inject 15 Units into the skin 3 (three) times daily with meals. (Patient not taking: Reported on 12/24/2021)   Insulin Glargine (LANTUS SOLOSTAR) 100 UNIT/ML Solostar Pen Inject 33 Units into the skin daily. (Patient not taking: Reported on 10/27/2021)   insulin lispro (HUMALOG) 100 UNIT/ML injection Inject 0.15 mLs (15 Units total) into the skin 3 (three) times daily before meals.   Lancets (ACCU-CHEK SOFT TOUCH) lancets Use as instructed   lisinopril (ZESTRIL) 5 MG tablet Take by mouth.   metFORMIN (GLUCOPHAGE) 1000 MG tablet TAKE 1 TABLET BY MOUTH TWO TIMES DAILY   metFORMIN (GLUCOPHAGE-XR) 500 MG 24 hr  tablet Take 2 tablets (1,000 mg total) by mouth 2 (two) times daily.   methylphenidate (RITALIN) 10 MG tablet Take 1 tablet (10 mg total) by mouth in the morning. (Patient not taking: Reported on 10/27/2021)   omega-3 acid ethyl esters (LOVAZA) 1 g capsule Take 1 capsule (1 g total) by mouth 2 (two) times daily.   omeprazole (PRILOSEC) 20 MG capsule Take by mouth. (Patient not taking: Reported on 10/27/2021)   polyethylene glycol powder (GLYCOLAX/MIRALAX) 17 GM/SCOOP powder Miralax 17 gram/dose oral powder  MIX 17 GRAMS IN LIQUID AND DRINK AS  NEEDED (Patient not taking: Reported on 10/27/2021)   tiZANidine (ZANAFLEX) 4 MG tablet Take 1 tablet (4 mg total) by mouth at bedtime.   No current facility-administered medications for this visit. (Other)   REVIEW OF SYSTEMS:   ALLERGIES Allergies  Allergen Reactions   Tape Itching and Other (See Comments)    Causes a lot of scratching, also   PAST MEDICAL HISTORY Past Medical History:  Diagnosis Date   Asthma    CVA (cerebral vascular accident) ([place])    Diabetes mellitus without complication ([place])    Past Surgical History:  Procedure Laterality Date   IR ANGIO INTRA EXTRACRAN SEL COM CAROTID INNOMINATE BILAT MOD SED  09/15/2019   IR ANGIO VERTEBRAL SEL VERTEBRAL UNI L MOD SED  09/15/2019    FAMILY HISTORY Family History  Problem Relation Age of Onset   Hypertension Mother    Hypertension Father     SOCIAL HISTORY Social History   Tobacco Use   Smoking status: Former   Smokeless tobacco: Never   Tobacco comments:    QUIT IN 2008  Vaping Use   Vaping Use: Never used  Substance Use Topics   Alcohol use: Yes    Comment: RARE   Drug use: No       OPHTHALMIC EXAM:  Not recorded     IMAGING AND PROCEDURES  Imaging and Procedures for 01/21/2022          ASSESSMENT/PLAN:  No diagnosis found.   1. CME OD  - OCT shows central IRF/edema OD -- mild increase  - BCVA OD 20/70 from 20/50  - FA (1.4.23) shows petaloid leakage consistent with CME OD + mild leaking MA OU  - will continue PF and Prolensa QID OD for CME component -- pt has not been using drops  - discussed importance of using PF and Prolensa - pt stated she would "try" to use them  - discussed possibility of periocular and intravitreal steroids if CME fails to improve / resolve  - recommend intravitreal injection for possible DME component, but pt refused  - f/u 4-6 wks for DFE, OCT  2. Moderate nonproliferative diabetic retinopathy OU  - mild cystic changes / DME OU - exam with scattered  MA OU - FA shows mild late leaking MA OU - OCT OD: mild interval increase in central IRF/edema; OS: Interval development of IRF/DME - recommend IVA today, but pt refused injection - f/u in 4-6 wks -- DFE/OCT  3,4. Hypertensive retinopathy OU - discussed importance of tight BP control - monitor  5. Mixed Cataract OU - The symptoms of cataract, surgical options, and treatments and risks were discussed with patient. - discussed diagnosis and progression - under the expert management of Dr. Shirleen Schirmer - clear from a retina standpoint to proceed with cataract surgery OS when pt and surgeon are ready - recommend holding off on OD until work up is completed and treatment plan  is initiated  Ophthalmic Meds Ordered this visit:  No orders of the defined types were placed in this encounter.    No follow-ups on file.  There are no Patient Instructions on file for this visit.   Explained the diagnoses, plan, and follow up with the patient and they expressed understanding.  Patient expressed understanding of the importance of proper follow up care.   This document serves as a record of services personally performed by Gardiner Sleeper, MD, PhD. It was created on their behalf by Orvan Falconer, an ophthalmic technician. The creation of this record is the provider's dictation and/or activities during the visit.    Electronically signed by: Orvan Falconer, OA, 01/19/22  12:13 PM     Gardiner Sleeper, M.D., Ph.D. Diseases & Surgery of the Retina and Vitreous [facility]  I have reviewed the above documentation for accuracy and completeness, and I agree with the above. Gardiner Sleeper, M.D., Ph.D. 12/29/21 12:13 PM   Abbreviations: M myopia (nearsighted); A astigmatism; H hyperopia (farsighted); P presbyopia; Mrx spectacle prescription;  CTL contact lenses; OD right eye; OS left eye; OU both eyes  XT exotropia; ET esotropia; PEK punctate epithelial keratitis; PEE  punctate epithelial erosions; DES dry eye syndrome; MGD meibomian gland dysfunction; ATs artificial tears; PFAT's preservative free artificial tears; [place] nuclear sclerotic cataract; PSC posterior subcapsular cataract; ERM epi-retinal membrane; PVD posterior vitreous detachment; RD retinal detachment; DM diabetes mellitus; DR diabetic retinopathy; NPDR non-proliferative diabetic retinopathy; PDR proliferative diabetic retinopathy; CSME clinically significant macular edema; DME diabetic macular edema; dbh dot blot hemorrhages; CWS cotton wool spot; POAG primary open angle glaucoma; C/D cup-to-disc ratio; HVF humphrey visual field; GVF goldmann visual field; OCT optical coherence tomography; IOP intraocular pressure; BRVO Branch retinal vein occlusion; CRVO central retinal vein occlusion; CRAO central retinal artery occlusion; BRAO branch retinal artery occlusion; RT retinal tear; SB scleral buckle; PPV pars plana vitrectomy; VH Vitreous hemorrhage; PRP panretinal laser photocoagulation; IVK intravitreal kenalog; VMT vitreomacular traction; MH Macular hole;  NVD neovascularization of the disc; NVE neovascularization elsewhere; AREDS age related eye disease study; ARMD age related macular degeneration; POAG primary open angle glaucoma; EBMD epithelial/anterior basement membrane dystrophy; ACIOL anterior chamber intraocular lens; IOL intraocular lens; PCIOL posterior chamber intraocular lens; Phaco/IOL phacoemulsification with intraocular lens placement; [place] photorefractive keratectomy; LASIK laser assisted in situ keratomileusis; HTN hypertension; DM diabetes mellitus; COPD chronic obstructive pulmonary disease

## 2022-01-21 ENCOUNTER — Encounter (INDEPENDENT_AMBULATORY_CARE_PROVIDER_SITE_OTHER): Payer: Medicare (Managed Care) | Admitting: Ophthalmology

## 2022-01-27 ENCOUNTER — Ambulatory Visit
Admission: RE | Admit: 2022-01-27 | Discharge: 2022-01-27 | Disposition: A | Discharge status: Home / Self Care | Payer: Medicare (Managed Care) | Source: Ambulatory Visit | Attending: Family Medicine | Admitting: Family Medicine

## 2022-01-27 ENCOUNTER — Other Ambulatory Visit: Payer: Self-pay | Admitting: Family Medicine

## 2022-01-27 DIAGNOSIS — R52 Pain, unspecified: Secondary | ICD-10-CM

## 2022-01-27 NOTE — Progress Notes (Shared)
[facility] Note  02/05/2022     CHIEF COMPLAINT Patient presents for No chief complaint on file.    HISTORY OF PRESENT ILLNESS: Christina Evans is a 61 y.o. female who presents to the clinic today for:    Pt states she never used the drops that were prescribed to her, bc she doesn't like putting drops in her eyes   Referring physician: Inc, [place],  [place] [id number]  HISTORICAL INFORMATION:   Selected notes from the [facility] Referred by Dr. Pearlean Brownie for concern of cataract clearance / NPDR LEE:  Ocular Hx- PMH-    CURRENT MEDICATIONS: Current Outpatient Medications (Ophthalmic Drugs)  Medication Sig   Bromfenac Sodium (PROLENSA) 0.07 % SOLN Place 1 drop into the right eye 4 (four) times daily.   prednisoLONE acetate (PRED FORTE) 1 % ophthalmic suspension Place 1 drop into the right eye 4 (four) times daily. (Patient not taking: Reported on 12/24/2021)   prednisoLONE acetate (PRED FORTE) 1 % ophthalmic suspension Place 1 drop into the right eye 4 (four) times daily. (Patient not taking: Reported on 12/24/2021)   No current facility-administered medications for this visit. (Ophthalmic Drugs)   Current Outpatient Medications (Other)  Medication Sig   acetaminophen (TYLENOL) 650 MG CR tablet Take 650 mg by mouth every 6 (six) hours as needed for pain.   albuterol (PROAIR HFA) 108 (90 Base) MCG/ACT inhaler Inhale 2 puffs into the lungs 4 (four) times daily as needed for wheezing or shortness of breath.   aspirin 325 MG EC tablet Take 1 tablet (325 mg total) by mouth daily.   atorvastatin (LIPITOR) 80 MG tablet Take 1 tablet (80 mg total) by mouth daily at 6 PM.   atorvastatin (LIPITOR) 80 MG tablet TAKE 1 TABLET BY MOUTH ONCE DAILY.   atorvastatin (LIPITOR) 80 MG tablet TAKE 1 TABLET BY MOUTH ONCE DAILY.   blood glucose meter kit and supplies Dispense based on patient and  insurance preference. Use up to four times daily as directed. (FOR ICD-9 250.00, 250.01).   botulinum toxin Type A (BOTOX) 100 units SOLR injection Inject 600 Units into the muscle every 3 (three) months. (Patient not taking: Reported on 12/24/2021)   citalopram (CELEXA) 40 MG tablet Take 1 tablet (40 mg total) by mouth daily.   citalopram (CELEXA) 40 MG tablet TAKE 1 TABLET BY MOUTH ONCE DAILY.   citalopram (CELEXA) 40 MG tablet TAKE 1 TABLET BY MOUTH ONCE A DAY   clopidogrel (PLAVIX) 75 MG tablet Take 1 tablet (75 mg total) by mouth daily.   clopidogrel (PLAVIX) 75 MG tablet TAKE 1 TABLET BY MOUTH EVERY DAY   glucose blood test strip Use as instructed   guaiFENesin (MUCINEX) 600 MG 12 hr tablet    IncobotulinumtoxinA (XEOMIN IM) Inject into the muscle every 3 (three) months. (Patient not taking: Reported on 10/27/2021)   insulin aspart (NOVOLOG FLEXPEN) 100 UNIT/ML FlexPen Inject 15 Units into the skin 3 (three) times daily with meals. (Patient not taking: Reported on 12/24/2021)   Insulin Glargine (LANTUS SOLOSTAR) 100 UNIT/ML Solostar Pen Inject 33 Units into the skin daily. (Patient not taking: Reported on 10/27/2021)   insulin lispro (HUMALOG) 100 UNIT/ML injection Inject 0.15 mLs (15 Units total) into the skin 3 (three) times daily before meals.   Lancets (ACCU-CHEK SOFT TOUCH) lancets Use as instructed   lisinopril (ZESTRIL) 5 MG tablet Take by mouth.  metFORMIN (GLUCOPHAGE) 1000 MG tablet TAKE 1 TABLET BY MOUTH TWO TIMES DAILY   metFORMIN (GLUCOPHAGE-XR) 500 MG 24 hr tablet Take 2 tablets (1,000 mg total) by mouth 2 (two) times daily.   methylphenidate (RITALIN) 10 MG tablet Take 1 tablet (10 mg total) by mouth in the morning. (Patient not taking: Reported on 10/27/2021)   omega-3 acid ethyl esters (LOVAZA) 1 g capsule Take 1 capsule (1 g total) by mouth 2 (two) times daily.   omeprazole (PRILOSEC) 20 MG capsule Take by mouth. (Patient not taking: Reported on 10/27/2021)   polyethylene glycol  powder (GLYCOLAX/MIRALAX) 17 GM/SCOOP powder Miralax 17 gram/dose oral powder  MIX 17 GRAMS IN LIQUID AND DRINK AS NEEDED (Patient not taking: Reported on 10/27/2021)   tiZANidine (ZANAFLEX) 4 MG tablet Take 1 tablet (4 mg total) by mouth at bedtime.   No current facility-administered medications for this visit. (Other)   REVIEW OF SYSTEMS:   ALLERGIES Allergies  Allergen Reactions   Tape Itching and Other (See Comments)    Causes a lot of scratching, also   PAST MEDICAL HISTORY Past Medical History:  Diagnosis Date   Asthma    CVA (cerebral vascular accident) ([place])    Diabetes mellitus without complication ([place])    Past Surgical History:  Procedure Laterality Date   IR ANGIO INTRA EXTRACRAN SEL COM CAROTID INNOMINATE BILAT MOD SED  09/15/2019   IR ANGIO VERTEBRAL SEL VERTEBRAL UNI L MOD SED  09/15/2019    FAMILY HISTORY Family History  Problem Relation Age of Onset   Hypertension Mother    Hypertension Father     SOCIAL HISTORY Social History   Tobacco Use   Smoking status: Former   Smokeless tobacco: Never   Tobacco comments:    QUIT IN 2008  Vaping Use   Vaping Use: Never used  Substance Use Topics   Alcohol use: Yes    Comment: RARE   Drug use: No       OPHTHALMIC EXAM:  Not recorded     IMAGING AND PROCEDURES  Imaging and Procedures for 02/05/2022          ASSESSMENT/PLAN:    ICD-10-CM   1. CME (cystoid macular edema), right  H35.351     2. Moderate nonproliferative diabetic retinopathy of both eyes with macular edema associated with type 2 diabetes mellitus ([place])  Z76.7341     3. Essential hypertension  I10     4. Hypertensive retinopathy of both eyes  H35.033     5. Combined forms of age-related cataract of both eyes  H25.813     6. Moderate nonproliferative diabetic retinopathy of right eye with macular edema associated with type 2 diabetes mellitus ([place])  E11.3311       1. CME OD  - OCT shows central IRF/edema OD -- mild  increase  - BCVA OD 20/70 from 20/50  - FA (1.4.23) shows petaloid leakage consistent with CME OD + mild leaking MA OU  - will continue PF and Prolensa QID OD for CME component -- pt has not been using drops  - discussed importance of using PF and Prolensa - pt stated she would "try" to use them  - discussed possibility of periocular and intravitreal steroids if CME fails to improve / resolve  - recommend intravitreal injection for possible DME component, but pt refused  - f/u 4-6 wks for DFE, OCT  2. Moderate nonproliferative diabetic retinopathy OU  - mild cystic changes / DME OU - exam with scattered  MA OU - FA shows mild late leaking MA OU - OCT OD: mild interval increase in central IRF/edema; OS: Interval development of IRF/DME - recommend IVA today, but pt refused injection - f/u in 4-6 wks -- DFE/OCT  3,4. Hypertensive retinopathy OU - discussed importance of tight BP control - monitor  5. Mixed Cataract OU - The symptoms of cataract, surgical options, and treatments and risks were discussed with patient. - discussed diagnosis and progression - under the expert management of Dr. Shirleen Schirmer - clear from a retina standpoint to proceed with cataract surgery OS when pt and surgeon are ready - recommend holding off on OD until work up is completed and treatment plan is initiated  Ophthalmic Meds Ordered this visit:  No orders of the defined types were placed in this encounter.    No follow-ups on file.  There are no Patient Instructions on file for this visit.   Explained the diagnoses, plan, and follow up with the patient and they expressed understanding.  Patient expressed understanding of the importance of proper follow up care.   This document serves as a record of services personally performed by Gardiner Sleeper, MD, PhD. It was created on their behalf by San Jetty. Owens Shark, OA an ophthalmic technician. The creation of this record is the provider's dictation and/or activities  during the visit.    Electronically signed by: San Jetty. Owens Shark, [place] 03.07.2023 10:44 AM    Gardiner Sleeper, M.D., Ph.D. Diseases & Surgery of the Retina and Vitreous [facility]     Abbreviations: M myopia (nearsighted); A astigmatism; H hyperopia (farsighted); P presbyopia; Mrx spectacle prescription;  CTL contact lenses; OD right eye; OS left eye; OU both eyes  XT exotropia; ET esotropia; PEK punctate epithelial keratitis; PEE punctate epithelial erosions; DES dry eye syndrome; MGD meibomian gland dysfunction; ATs artificial tears; PFAT's preservative free artificial tears; [place] nuclear sclerotic cataract; PSC posterior subcapsular cataract; ERM epi-retinal membrane; PVD posterior vitreous detachment; RD retinal detachment; DM diabetes mellitus; DR diabetic retinopathy; NPDR non-proliferative diabetic retinopathy; PDR proliferative diabetic retinopathy; CSME clinically significant macular edema; DME diabetic macular edema; dbh dot blot hemorrhages; CWS cotton wool spot; POAG primary open angle glaucoma; C/D cup-to-disc ratio; HVF humphrey visual field; GVF goldmann visual field; OCT optical coherence tomography; IOP intraocular pressure; BRVO Branch retinal vein occlusion; CRVO central retinal vein occlusion; CRAO central retinal artery occlusion; BRAO branch retinal artery occlusion; RT retinal tear; SB scleral buckle; PPV pars plana vitrectomy; VH Vitreous hemorrhage; PRP panretinal laser photocoagulation; IVK intravitreal kenalog; VMT vitreomacular traction; MH Macular hole;  NVD neovascularization of the disc; NVE neovascularization elsewhere; AREDS age related eye disease study; ARMD age related macular degeneration; POAG primary open angle glaucoma; EBMD epithelial/anterior basement membrane dystrophy; ACIOL anterior chamber intraocular lens; IOL intraocular lens; PCIOL posterior chamber intraocular lens; Phaco/IOL phacoemulsification with intraocular lens placement; [place]  photorefractive keratectomy; LASIK laser assisted in situ keratomileusis; HTN hypertension; DM diabetes mellitus; COPD chronic obstructive pulmonary disease

## 2022-01-28 ENCOUNTER — Other Ambulatory Visit: Payer: Self-pay | Admitting: Orthopaedic Surgery

## 2022-02-02 ENCOUNTER — Ambulatory Visit: Payer: Medicare (Managed Care)

## 2022-02-02 ENCOUNTER — Ambulatory Visit
Admission: RE | Admit: 2022-02-02 | Discharge: 2022-02-02 | Disposition: A | Discharge status: Home / Self Care | Payer: Medicare (Managed Care) | Source: Ambulatory Visit | Attending: Family Medicine | Admitting: Family Medicine

## 2022-02-02 ENCOUNTER — Other Ambulatory Visit: Payer: Self-pay

## 2022-02-02 ENCOUNTER — Encounter (HOSPITAL_COMMUNITY): Payer: Self-pay | Admitting: Orthopaedic Surgery

## 2022-02-02 DIAGNOSIS — Z1231 Encounter for screening mammogram for malignant neoplasm of breast: Secondary | ICD-10-CM

## 2022-02-02 NOTE — Pre-Procedure Instructions (Signed)
?[facility] - [place], [place] - [address] ?[address] ?[place] [address] ?Phone: [phone] Fax: [phone] ? ?[facility] ([facility] ) - [place], [place] - [address] ?[address] ?[address] ?[place] [address] ?Phone: [phone] Fax: [phone] ? ?PCP - [facility] [place] and [address] ? ? ?ECHO - 09/14/19 ? ? ?Fasting Blood Sugar - 120's ?Checks Blood Sugar 3/day ? ?Blood Thinner Instructions: Plavix last dose ?  ?ERAS Protcol - Clears until 0600 ? ?COVID TEST- N/A Ambulatory ? ?Anesthesia review: Y ? ?Patient verbally denies any shortness of breath, fever, cough and chest pain during phone call ? ? ?-------------  SDW INSTRUCTIONS given: ? ?Your procedure is scheduled on 02/03/22. ? Report to [facility] Main Entrance "A" at 0630 A.M., and check in at the Admitting office. ? Call this number if you have problems the morning of surgery: ? [phone] ? ? Remember: ? Do not eat after midnight the night before your surgery ? ?You may drink clear liquids until 0600 the morning of your surgery.   ?Clear liquids allowed are: Water, Non-Citrus Juices (without pulp), Carbonated Beverages, Clear Tea, Black Coffee Only, and Gatorade ?  ? Take these medicines the morning of surgery with A SIP OF WATER  ?citalopram (CELEXA) ?tamsulosin [facility]) ?trimethoprim (TRIMPEX)  ?acetaminophen (TYLENOL)-if needed ?albuterol (PROAIR HFA)-if needed (Please bring day of surgery) ?HYDROcodone-acetaminophen (NORCO/VICODIN)-if needed ? ? ?As of today, STOP taking any Aspirin (unless otherwise instructed by your surgeon) Aleve, Naproxen, Ibuprofen, Motrin, Advil, Goody's, BC's, all herbal medications, fish oil, and all vitamins. ? ?** PLEASE check your blood sugar the morning of your surgery when you wake up and every 2 hours until you get to the Short Stay unit. ? ?If your blood sugar is less than 70 mg/dL, you will need to treat for low blood  sugar: ?Do not take insulin. ?Treat a low blood sugar (less than 70 mg/dL) with ? cup of clear juice (cranberry or apple), 4 glucose tablets, OR glucose gel. ?Recheck blood sugar in 15 minutes after treatment (to make sure it is greater than 70 mg/dL). If your blood sugar is not greater than 70 mg/dL on recheck, call [phone] for further instructions.  ?         ?           Do not wear jewelry, make up, or nail polish ?           Do not wear lotions, powders, perfumes/colognes, or deodorant. ?           Do not shave 48 hours prior to surgery.  Men may shave face and neck. ?           Do not bring valuables to the hospital. ?           [facility] is not responsible for any belongings or valuables. ? ?Do NOT Smoke (Tobacco/Vaping) 24 hours prior to your procedure ?If you use a CPAP at night, you may bring all equipment for your overnight stay. ?  ?Contacts, glasses, dentures or bridgework may not be worn into surgery.    ?  ?For patients admitted to the hospital, discharge time will be determined by your treatment team. ?  ?Patients discharged the day of surgery will not be allowed to drive home, and someone needs to stay with them for 24 hours. ? ? ? ?Special instructions:   ?[place]- Preparing For Surgery ? ?  Before surgery, you can play an important role. Because skin is not sterile, your skin needs to be as free of germs as possible. You can reduce the number of germs on your skin by washing with CHG (chlorahexidine gluconate) Soap before surgery.  CHG is an antiseptic cleaner which kills germs and bonds with the skin to continue killing germs even after washing.   ? ?Oral Hygiene is also important to reduce your risk of infection.  Remember - BRUSH YOUR TEETH THE MORNING OF SURGERY WITH YOUR REGULAR TOOTHPASTE ? ?Please do not use if you have an allergy to CHG or antibacterial soaps. If your skin becomes reddened/irritated stop using the CHG.  ?Do not shave (including legs and underarms) for at least 48  hours prior to first CHG shower. It is OK to shave your face. ? ?Please follow these instructions carefully. ?  ?Shower the NIGHT BEFORE SURGERY and the MORNING OF SURGERY with DIAL Soap.  ? ?Pat yourself dry with a CLEAN TOWEL. ? ?Wear CLEAN PAJAMAS to bed the night before surgery ? ?Place CLEAN SHEETS on your bed the night of your first shower and DO NOT SLEEP WITH PETS. ? ? ?Day of Surgery: ?Please shower morning of surgery  ?Wear Clean/Comfortable clothing the morning of surgery ?Do not apply any deodorants/lotions.   ?Remember to brush your teeth WITH YOUR REGULAR TOOTHPASTE. ?  ?Questions were answered. Patient verbalized understanding of instructions.  ? ? ?    ?

## 2022-02-03 ENCOUNTER — Ambulatory Visit (HOSPITAL_BASED_OUTPATIENT_CLINIC_OR_DEPARTMENT_OTHER): Payer: Medicare (Managed Care) | Admitting: Emergency Medicine

## 2022-02-03 ENCOUNTER — Other Ambulatory Visit: Payer: Self-pay

## 2022-02-03 ENCOUNTER — Encounter (HOSPITAL_COMMUNITY)
Admission: RE | Disposition: A | Discharge status: Home / Self Care | Payer: Self-pay | Source: Ambulatory Visit | Attending: Orthopaedic Surgery

## 2022-02-03 ENCOUNTER — Ambulatory Visit (HOSPITAL_COMMUNITY)
Admission: RE | Admit: 2022-02-03 | Discharge: 2022-02-03 | Disposition: A | Discharge status: Home / Self Care | Payer: Medicare (Managed Care) | Source: Ambulatory Visit | Attending: Orthopaedic Surgery | Admitting: Orthopaedic Surgery

## 2022-02-03 ENCOUNTER — Ambulatory Visit (HOSPITAL_COMMUNITY): Payer: Medicare (Managed Care) | Admitting: Emergency Medicine

## 2022-02-03 ENCOUNTER — Encounter (HOSPITAL_COMMUNITY): Payer: Self-pay | Admitting: Orthopaedic Surgery

## 2022-02-03 ENCOUNTER — Other Ambulatory Visit: Payer: Self-pay | Admitting: Family Medicine

## 2022-02-03 ENCOUNTER — Ambulatory Visit (HOSPITAL_COMMUNITY): Payer: Medicare (Managed Care)

## 2022-02-03 DIAGNOSIS — I10 Essential (primary) hypertension: Secondary | ICD-10-CM

## 2022-02-03 DIAGNOSIS — E119 Type 2 diabetes mellitus without complications: Secondary | ICD-10-CM | POA: Insufficient documentation

## 2022-02-03 DIAGNOSIS — Z87891 Personal history of nicotine dependence: Secondary | ICD-10-CM | POA: Insufficient documentation

## 2022-02-03 DIAGNOSIS — Z7902 Long term (current) use of antithrombotics/antiplatelets: Secondary | ICD-10-CM | POA: Diagnosis not present

## 2022-02-03 DIAGNOSIS — S82842A Displaced bimalleolar fracture of left lower leg, initial encounter for closed fracture: Secondary | ICD-10-CM | POA: Diagnosis present

## 2022-02-03 DIAGNOSIS — I69354 Hemiplegia and hemiparesis following cerebral infarction affecting left non-dominant side: Secondary | ICD-10-CM | POA: Diagnosis not present

## 2022-02-03 DIAGNOSIS — J45909 Unspecified asthma, uncomplicated: Secondary | ICD-10-CM | POA: Insufficient documentation

## 2022-02-03 DIAGNOSIS — S93432A Sprain of tibiofibular ligament of left ankle, initial encounter: Secondary | ICD-10-CM

## 2022-02-03 DIAGNOSIS — Z794 Long term (current) use of insulin: Secondary | ICD-10-CM | POA: Insufficient documentation

## 2022-02-03 DIAGNOSIS — F0390 Unspecified dementia without behavioral disturbance: Secondary | ICD-10-CM | POA: Diagnosis not present

## 2022-02-03 DIAGNOSIS — Z79899 Other long term (current) drug therapy: Secondary | ICD-10-CM | POA: Diagnosis not present

## 2022-02-03 DIAGNOSIS — S82852A Displaced trimalleolar fracture of left lower leg, initial encounter for closed fracture: Secondary | ICD-10-CM | POA: Insufficient documentation

## 2022-02-03 DIAGNOSIS — Z419 Encounter for procedure for purposes other than remedying health state, unspecified: Secondary | ICD-10-CM

## 2022-02-03 DIAGNOSIS — Z7984 Long term (current) use of oral hypoglycemic drugs: Secondary | ICD-10-CM | POA: Diagnosis not present

## 2022-02-03 DIAGNOSIS — Z1231 Encounter for screening mammogram for malignant neoplasm of breast: Secondary | ICD-10-CM

## 2022-02-03 HISTORY — PX: ORIF ANKLE FRACTURE: SHX5408

## 2022-02-03 HISTORY — DX: Unspecified dementia, unspecified severity, without behavioral disturbance, psychotic disturbance, mood disturbance, and anxiety: F03.90

## 2022-02-03 HISTORY — PX: SYNDESMOSIS REPAIR: SHX5182

## 2022-02-03 LAB — CBC
HCT: 39.1 % (ref 36.0–46.0)
Hemoglobin: 13.2 g/dL (ref 12.0–15.0)
MCH: 32 pg (ref 26.0–34.0)
MCHC: 33.8 g/dL (ref 30.0–36.0)
MCV: 94.9 fL (ref 80.0–100.0)
Platelets: 303 10*3/uL (ref 150–400)
RBC: 4.12 MIL/uL (ref 3.87–5.11)
RDW: 12.1 % (ref 11.5–15.5)
WBC: 7.1 10*3/uL (ref 4.0–10.5)
nRBC: 0 % (ref 0.0–0.2)

## 2022-02-03 LAB — BASIC METABOLIC PANEL
Anion gap: 9 (ref 5–15)
BUN: 12 mg/dL (ref 6–20)
CO2: 25 mmol/L (ref 22–32)
Calcium: 9.4 mg/dL (ref 8.9–10.3)
Chloride: 104 mmol/L (ref 98–111)
Creatinine, Ser: 0.56 mg/dL (ref 0.44–1.00)
GFR, Estimated: >60 mL/min (ref >60)
Glucose, Bld: 113 mg/dL — ABNORMAL HIGH (ref 70–99)
Potassium: 4 mmol/L (ref 3.5–5.1)
Sodium: 138 mmol/L (ref 135–145)

## 2022-02-03 LAB — GLUCOSE, CAPILLARY
Glucose-Capillary: 108 mg/dL — ABNORMAL HIGH (ref 70–99)
Glucose-Capillary: 106 mg/dL — ABNORMAL HIGH (ref 70–99)
Glucose-Capillary: 133 mg/dL — ABNORMAL HIGH (ref 70–99)

## 2022-02-03 LAB — SURGICAL PCR SCREEN
MRSA, PCR: NEGATIVE
Staphylococcus aureus: NEGATIVE

## 2022-02-03 SURGERY — OPEN REDUCTION INTERNAL FIXATION (ORIF) ANKLE FRACTURE
Anesthesia: Regional | Site: Ankle | Laterality: Left

## 2022-02-03 MED ORDER — CEFAZOLIN SODIUM-DEXTROSE 2-4 GM/100ML-% IV SOLN
2.0000 g | INTRAVENOUS | Status: AC
  Administered 2022-02-03: 2 g via INTRAVENOUS
  Filled 2022-02-03: qty 100

## 2022-02-03 MED ORDER — OXYCODONE HCL 5 MG PO TABS: 5.0000 mg | ORAL_TABLET | ORAL | 0 refills | Status: AC | PRN

## 2022-02-03 MED ORDER — ONDANSETRON HCL 4 MG/2ML IJ SOLN
INTRAMUSCULAR | Status: AC
  Filled 2022-02-03: qty 2

## 2022-02-03 MED ORDER — FENTANYL CITRATE (PF) 100 MCG/2ML IJ SOLN
INTRAMUSCULAR | Status: AC
  Filled 2022-02-03: qty 2

## 2022-02-03 MED ORDER — CHLORHEXIDINE GLUCONATE 0.12 % MT SOLN
15.0000 mL | Freq: Once | OROMUCOSAL | Status: AC
  Administered 2022-02-03: 15 mL via OROMUCOSAL
  Filled 2022-02-03: qty 15

## 2022-02-03 MED ORDER — PROMETHAZINE HCL 25 MG/ML IJ SOLN: 6.2500 mg | INTRAMUSCULAR | Status: DC | PRN

## 2022-02-03 MED ORDER — DEXAMETHASONE SODIUM PHOSPHATE 10 MG/ML IJ SOLN
INTRAMUSCULAR | Status: AC
  Filled 2022-02-03: qty 1

## 2022-02-03 MED ORDER — FENTANYL CITRATE (PF) 250 MCG/5ML IJ SOLN
INTRAMUSCULAR | Status: AC
  Filled 2022-02-03: qty 5

## 2022-02-03 MED ORDER — PHENYLEPHRINE HCL-NACL 20-0.9 MG/250ML-% IV SOLN
INTRAVENOUS | Status: DC | PRN
  Administered 2022-02-03: 40 ug/min via INTRAVENOUS

## 2022-02-03 MED ORDER — ROPIVACAINE HCL 5 MG/ML IJ SOLN
INTRAMUSCULAR | Status: DC | PRN
  Administered 2022-02-03: 50 mL via PERINEURAL

## 2022-02-03 MED ORDER — ONDANSETRON HCL 4 MG/2ML IJ SOLN
INTRAMUSCULAR | Status: DC | PRN
  Administered 2022-02-03: 4 mg via INTRAVENOUS

## 2022-02-03 MED ORDER — INSULIN ASPART 100 UNIT/ML IJ SOLN: 0.0000 [IU] | INTRAMUSCULAR | Status: DC | PRN

## 2022-02-03 MED ORDER — OXYCODONE HCL 5 MG PO TABS: 5.0000 mg | ORAL_TABLET | Freq: Once | ORAL | Status: DC | PRN

## 2022-02-03 MED ORDER — PROPOFOL 10 MG/ML IV BOLUS
INTRAVENOUS | Status: AC
  Filled 2022-02-03: qty 20

## 2022-02-03 MED ORDER — MEPERIDINE HCL 25 MG/ML IJ SOLN: 6.2500 mg | INTRAMUSCULAR | Status: DC | PRN

## 2022-02-03 MED ORDER — MIDAZOLAM HCL 2 MG/2ML IJ SOLN
INTRAMUSCULAR | Status: DC | PRN
  Administered 2022-02-03: 1 mg via INTRAVENOUS

## 2022-02-03 MED ORDER — ORAL CARE MOUTH RINSE: 15.0000 mL | Freq: Once | OROMUCOSAL | Status: AC

## 2022-02-03 MED ORDER — AMISULPRIDE (ANTIEMETIC) 5 MG/2ML IV SOLN: 10.0000 mg | Freq: Once | INTRAVENOUS | Status: DC | PRN

## 2022-02-03 MED ORDER — MIDAZOLAM HCL 2 MG/2ML IJ SOLN
INTRAMUSCULAR | Status: AC
  Filled 2022-02-03: qty 2

## 2022-02-03 MED ORDER — 0.9 % SODIUM CHLORIDE (POUR BTL) OPTIME
TOPICAL | Status: DC | PRN
  Administered 2022-02-03: 1000 mL

## 2022-02-03 MED ORDER — LACTATED RINGERS IV SOLN: INTRAVENOUS | Status: DC

## 2022-02-03 MED ORDER — LIDOCAINE 2% (20 MG/ML) 5 ML SYRINGE
INTRAMUSCULAR | Status: DC | PRN
  Administered 2022-02-03: 40 mg via INTRAVENOUS

## 2022-02-03 MED ORDER — PROPOFOL 10 MG/ML IV BOLUS
INTRAVENOUS | Status: DC | PRN
  Administered 2022-02-03: 150 mg via INTRAVENOUS

## 2022-02-03 MED ORDER — OXYCODONE HCL 5 MG/5ML PO SOLN: 5.0000 mg | Freq: Once | ORAL | Status: DC | PRN

## 2022-02-03 MED ORDER — LIDOCAINE 2% (20 MG/ML) 5 ML SYRINGE
INTRAMUSCULAR | Status: AC
  Filled 2022-02-03: qty 5

## 2022-02-03 MED ORDER — DEXAMETHASONE SODIUM PHOSPHATE 10 MG/ML IJ SOLN
INTRAMUSCULAR | Status: DC | PRN
  Administered 2022-02-03: 10 mg via INTRAVENOUS

## 2022-02-03 MED ORDER — HYDROMORPHONE HCL 1 MG/ML IJ SOLN: 0.2500 mg | INTRAMUSCULAR | Status: DC | PRN

## 2022-02-03 MED ORDER — FENTANYL CITRATE (PF) 250 MCG/5ML IJ SOLN
INTRAMUSCULAR | Status: DC | PRN
Start: 2022-02-03 — End: 2022-02-03
  Administered 2022-02-03: 50 ug via INTRAVENOUS

## 2022-02-03 SURGICAL SUPPLY — 67 items
ALCOHOL 70% 16 OZ (MISCELLANEOUS) ×3 IMPLANT
BAG COUNTER SPONGE SURGICOUNT (BAG) ×3 IMPLANT
BANDAGE ESMARK 6X9 LF (GAUZE/BANDAGES/DRESSINGS) IMPLANT
BIT DRILL 2 CANN GRADUATED (BIT) ×1 IMPLANT
BIT DRILL 2.5 CANN STRL (BIT) ×1 IMPLANT
BIT DRILL 2.6 CANN (BIT) ×1 IMPLANT
BIT DRILL 3 CANN ENDOSCOPIC (BIT) ×1 IMPLANT
BLADE SURG 15 STRL LF DISP TIS (BLADE) ×2 IMPLANT
BLADE SURG 15 STRL SS (BLADE) ×1
BNDG COHESIVE 4X5 TAN ST LF (GAUZE/BANDAGES/DRESSINGS) ×1 IMPLANT
BNDG COHESIVE 4X5 TAN STRL (GAUZE/BANDAGES/DRESSINGS) IMPLANT
BNDG COHESIVE 6X5 TAN STRL LF (GAUZE/BANDAGES/DRESSINGS) IMPLANT
BNDG ELASTIC 6X10 VLCR STRL LF (GAUZE/BANDAGES/DRESSINGS) ×3 IMPLANT
BNDG ESMARK 6X9 LF (GAUZE/BANDAGES/DRESSINGS)
CANISTER SUCT 3000ML PPV (MISCELLANEOUS) ×3 IMPLANT
CHLORAPREP W/TINT 26 (MISCELLANEOUS) ×7 IMPLANT
COVER SURGICAL LIGHT HANDLE (MISCELLANEOUS) ×3 IMPLANT
CUFF TOURN SGL QUICK 34 (TOURNIQUET CUFF) ×1
CUFF TOURN SGL QUICK 42 (TOURNIQUET CUFF) IMPLANT
CUFF TRNQT CYL 34X4.125X (TOURNIQUET CUFF) ×2 IMPLANT
DRAPE C-ARM 42X72 X-RAY (DRAPES) ×1 IMPLANT
DRAPE C-ARMOR (DRAPES) ×1 IMPLANT
DRAPE OEC MINIVIEW 54X84 (DRAPES) ×2 IMPLANT
DRAPE U-SHAPE 47X51 STRL (DRAPES) ×3 IMPLANT
DRSG MEPITEL 4X7.2 (GAUZE/BANDAGES/DRESSINGS) ×3 IMPLANT
DRSG PAD ABDOMINAL 8X10 ST (GAUZE/BANDAGES/DRESSINGS) ×6 IMPLANT
DRSG XEROFORM 1X8 (GAUZE/BANDAGES/DRESSINGS) ×3 IMPLANT
ELECT REM PT RETURN 9FT ADLT (ELECTROSURGICAL) ×3
ELECTRODE REM PT RTRN 9FT ADLT (ELECTROSURGICAL) ×2 IMPLANT
GAUZE SPONGE 4X4 12PLY STRL (GAUZE/BANDAGES/DRESSINGS) ×1 IMPLANT
GAUZE SPONGE 4X4 12PLY STRL LF (GAUZE/BANDAGES/DRESSINGS) ×3 IMPLANT
GLOVE SRG 8 PF TXTR STRL LF DI (GLOVE) ×4 IMPLANT
GLOVE SURG ENC TEXT LTX SZ7.5 (GLOVE) ×6 IMPLANT
GLOVE SURG UNDER POLY LF SZ8 (GLOVE) ×2
GOWN STRL REUS W/ TWL LRG LVL3 (GOWN DISPOSABLE) ×2 IMPLANT
GOWN STRL REUS W/ TWL XL LVL3 (GOWN DISPOSABLE) ×4 IMPLANT
GOWN STRL REUS W/TWL LRG LVL3 (GOWN DISPOSABLE) ×1
GOWN STRL REUS W/TWL XL LVL3 (GOWN DISPOSABLE) ×2
GUIDEWIRE 1.35MM (WIRE) ×2 IMPLANT
KIT BASIN OR (CUSTOM PROCEDURE TRAY) ×3 IMPLANT
KIT TURNOVER KIT B (KITS) ×3 IMPLANT
NS IRRIG 1000ML POUR BTL (IV SOLUTION) ×3 IMPLANT
PACK ORTHO EXTREMITY (CUSTOM PROCEDURE TRAY) ×3 IMPLANT
PAD ARMBOARD 7.5X6 YLW CONV (MISCELLANEOUS) ×6 IMPLANT
PAD CAST 4YDX4 CTTN HI CHSV (CAST SUPPLIES) ×2 IMPLANT
PADDING CAST COTTON 4X4 STRL (CAST SUPPLIES) ×1
PLATE LOCK DIST FIB 5H TTNIUM (Plate) ×1 IMPLANT
SCREW CORT FT ANKLE 3.5X54 (Screw) ×1 IMPLANT
SCREW LO-PRO TI 3.5X16MM (Screw) ×1 IMPLANT
SCREW LOCK 18X3XVALOPRFL (Screw) IMPLANT
SCREW LOCK TI QF 3X20 (Screw) ×1 IMPLANT
SCREW LOCKING 3.0X18 (Screw) ×2 IMPLANT
SCREW LOCKING VARIABLE 3.0X16 (Screw) ×1 IMPLANT
SCREW LP CORT 3.0X20 (Screw) ×1 IMPLANT
SCREW LP TI 3.5X14MM (Screw) ×2 IMPLANT
SCREW NL FT KREULOCK 3.5X50 (Screw) ×1 IMPLANT
SCREW QCFIX CANN 4X46 SHT (Screw) ×2 IMPLANT
SPONGE T-LAP 18X18 ~~LOC~~+RFID (SPONGE) ×3 IMPLANT
SUCTION FRAZIER HANDLE 10FR (MISCELLANEOUS) ×1
SUCTION TUBE FRAZIER 10FR DISP (MISCELLANEOUS) ×2 IMPLANT
SUT ETHILON 3 0 PS 1 (SUTURE) ×5 IMPLANT
SUT MNCRL AB 3-0 PS2 27 (SUTURE) ×4 IMPLANT
SUT VIC AB 2-0 CT1 27 (SUTURE) ×2
SUT VIC AB 2-0 CT1 TAPERPNT 27 (SUTURE) ×4 IMPLANT
TOWEL GREEN STERILE (TOWEL DISPOSABLE) ×3 IMPLANT
TOWEL GREEN STERILE FF (TOWEL DISPOSABLE) ×3 IMPLANT
TUBE CONNECTING 12X1/4 (SUCTIONS) ×3 IMPLANT

## 2022-02-03 NOTE — Anesthesia Postprocedure Evaluation (Signed)
Anesthesia Post Note ? ?Patient: Christina Evans ? ?Procedure(s) Performed: OPEN TREATMENT OF LEFT BIMALLEOLAR ANKLE FRACTURE (Left: Ankle) ?SYNDESMOSIS (Left) ? ?  ? ?Patient location during evaluation: PACU ?Anesthesia Type: Regional and General ?Level of consciousness: awake and alert ?Pain management: pain level controlled ?Vital Signs Assessment: post-procedure vital signs reviewed and stable ?Respiratory status: spontaneous breathing, nonlabored ventilation and respiratory function stable ?Cardiovascular status: blood pressure returned to baseline and stable ?Postop Assessment: no apparent nausea or vomiting ?Anesthetic complications: no ? ? ?No notable events documented. ? ?Last Vitals:  ?Vitals:  ? 02/03/22 1115 02/03/22 1130  ?BP: (!) 145/72 (!) 146/75  ?Pulse: 90 95  ?Resp: (!) 23 (!) 21  ?Temp:  36.7 ?C  ?SpO2: 100% 96%  ?  ?Last Pain:  ?Vitals:  ? 02/03/22 1130  ?TempSrc:   ?PainSc: 0-No pain  ? ? ?  ?  ?  ?  ?  ?  ? ?Lowella Curb ? ? ? ? ?

## 2022-02-03 NOTE — Anesthesia Procedure Notes (Signed)
Procedure Name: LMA Insertion ?Date/Time: 02/03/2022 9:16 AM ?Performed by: Quentin Ore, CRNA ?Pre-anesthesia Checklist: Patient identified, Emergency Drugs available, Suction available and Patient being monitored ?Patient Re-evaluated:Patient Re-evaluated prior to induction ?Oxygen Delivery Method: Circle system utilized ?Preoxygenation: Pre-oxygenation with 100% oxygen ?Induction Type: IV induction ?LMA: LMA inserted ?LMA Size: 4.0 ?Number of attempts: 1 ?Placement Confirmation: positive ETCO2 and breath sounds checked- equal and bilateral ?Tube secured with: Tape ?Dental Injury: Teeth and Oropharynx as per pre-operative assessment  ? ? ? ? ?

## 2022-02-03 NOTE — Discharge Instructions (Signed)
DR. Susa Simmonds FOOT & ANKLE SURGERY POST-OP INSTRUCTIONS ? ? ?Pain Management ?The numbing medicine and your leg will last around 18 hours, take a dose of your pain medicine as soon as you feel it wearing off to avoid rebound pain. ?Keep your foot elevated above heart level.  Make sure that your heel hangs free ('floats'). ?Take all prescribed medication as directed. ?If taking narcotic pain medication you may want to use an over-the-counter stool softener to avoid constipation. ?You may take over-the-counter NSAIDs (ibuprofen, naproxen, etc.) as well as over-the-counter acetaminophen as directed on the packaging as a supplement for your pain and may also use it to wean away from the prescription medication. ?May resume baseline Plavix dosage 24 hours after surgery  ? ?Activity ?Non-weightbearing ?Keep splint intact ? ?First Postoperative Visit ?Your first postop visit will be at least 2 weeks after surgery.  This should be scheduled when you schedule surgery. ?If you do not have a postoperative visit scheduled please call [phone] to schedule an appointment. ?At the appointment your incision will be evaluated for suture removal, x-rays will be obtained if necessary. ? ?General Instructions ?Swelling is very common after foot and ankle surgery.  It often takes 3 months for the foot and ankle to begin to feel comfortable.  Some amount of swelling will persist for 6-12 months. ?DO NOT change the dressing.  If there is a problem with the dressing (too tight, loose, gets wet, etc.) please contact Dr. Donnie Mesa office. ?DO NOT get the dressing wet.  For showers you can use an over-the-counter cast cover or wrap a washcloth around the top of your dressing and then cover it with a plastic bag and tape it to your leg. ?DO NOT soak the incision (no tubs, pools, bath, etc.) until you have approval from Dr. Susa Simmonds. ? ?Contact Dr. Garret Reddish office or go to Emergency Room if: ?Temperature above 101? F. ?Increasing pain that is  unresponsive to pain medication or elevation ?Excessive redness or swelling in your foot ?Dressing problems - excessive bloody drainage, looseness or tightness, or if dressing gets wet ?Develop pain, swelling, warmth, or discoloration of your calf ? ?

## 2022-02-03 NOTE — Op Note (Signed)
?Christina Evans ?female ?61 y.o. ?02/03/2022 ? ?PreOperative Diagnosis: ?Left trimalleolar ankle fracture ? ?PostOperative Diagnosis: ?Left trimalleolar ankle fracture ?Syndesmotic disruption ? ?PROCEDURE: ?Open treatment of left trimalleolar ankle fracture without posterior fixation ?Open treatment of syndesmosis ?Stress view fluoroscopy ? ?SURGEON: Dub Mikes, MD ? ?ASSISTANT: Jesse [place], PA-C: His assistance was necessary for prep and drape, exposure, holding retractors, placement of hardware, provisional fixation, wound closure and splinting.  ? ?ANESTHESIA: General LMA with peripheral nerve blockade ? ?FINDINGS: ?Displaced trimalleolar ankle fracture with syndesmotic disruption ? ?IMPLANTS: ?Arthrex distal fibular locking plate ?4.55mm cannulated screws ?Fully threaded syndesmotic screws ? ?INDICATIONS:61 y.o. female sustained the above injury when she got her foot caught in the car.  She has had the [place] due to previous stroke.  She also has diabetes.  Given the displacement and instability pattern she was indicated for surgery.  She did understand that having diabetes will increase her risk of wound healing complications, infection and overall poor outcome.  She was to proceed with surgery.   Patient understood the risks, benefits and alternatives to surgery which include but are not limited to wound healing complications, infection, nonunion, malunion, need for further surgery as well as damage to surrounding structures. They also understood the potential for continued pain in that there were no guarantees of acceptable outcome After weighing these risks the patient opted to proceed with surgery. ? ?PROCEDURE: ?Patient was identified in the preoperative holding area.  The left leg was marked by myself.  Consent was signed by myself and the patient.  Block was performed by anesthesia in the preoperative holding area.  Patient was taken to the operative suite and placed supine on the operative  table.  General LMA anesthesia was induced without difficulty. Bump was placed under the operative hip and bone foam was used.  All bony prominences were well padded.  Tourniquet was placed on the operative thigh.  Preoperative antibiotics were given. The extremity was prepped and draped in the usual sterile fashion and surgical timeout was performed.  The limb was elevated and the tourniquet was inflated to 250 mmHg. ? ?We began by making a longitudinal incision overlying the fibula.  This was taken sharply down through skin and subcutaneous tissue.  Blunt dissection was used to identify any branch of the superficial peroneal nerve that was  not identified.  The incision was then taken sharply down to bone and the fracture site was identified.  The fracture site was mobilized.   The fracture site were cleaned with a rondure and curette of any fracture hematoma and callus formation.  Then the fracture of the fibula was reduced under direct visualization and held provisionally with a lobster claw.  Then fluoroscopy confirmed adequate reduction of the ankle mortise at that time.  Then a distal fibular locking plate was placed across the fracture site for fixation.  This provided good stability of the distal fibula fracture.   ? ?We then turned our attention to the medial malleolus.  The medial malleolus fracture was well reduced after reduction of the fibula.  We proceeded with percutaneous fixation of the medial malleolus. ?This was done with the ankle held in an inverted position.  Then fluoroscopy confirmed adequate reduction.  2 4.0 mm partially-threaded cannulated screws were placed across the fracture site with good fixation.   ? ?We then proceeded with stress view fluoroscopy of the ankle.  It was noted on stress views that there is widening of the syndesmosis and medial clear space.  We then proceeded with open treatment of the syndesmosis. ? ?Separate deep incision was created distally along the level of the  syndesmosis.  We would gain access to the syndesmosis and the ligaments had been torn.  The syndesmosis was in cleared of any interposed tissue using a rongeur and a Therapist, nutritional.  Then the syndesmosis was reduced under direct position and held provisionally with a Weber clamp.  Fluoroscopy confirmed appropriate position of the syndesmosis and closing down the mortise.  We then proceeded placed to fully threaded screws through the plate into the medial cortex of the tibia.  This was tensioned well and provided good stability of the syndesmosis.  Postplacement stress views were stable. ? ?Then lateral x-rays were obtained in the posterior malleolar fragment appeared to be well reduced and did not require internal fixation.  Final films were obtained. ? ?The wounds were irrigated and the subcuticular tissue was closed with 2-0 Vicryl, 3-0 Monocryl and the skin with nylon suture.  Xeroform was placed on the wounds as well as 4 x 4's and sterile sheet cotton.  The tourniquet was released.   ? ?Patient was placed in a nonweightbearing short leg splint.  Patient tolerated the procedure well.  There were no complications.  Patient was awakened from anesthesia and taken recovery in stable condition. ? ?POST OPERATIVE INSTRUCTIONS: ?Nonweightbearing on operative extremity ?Keep splint dry and limb elevated ?Continue Plavix starting tomorrow ?Call the office with concerns ?Follow-up in 2 weeks for splint removal, x-rays of the operative ankle, nonweightbearing and suture removal if appropriate.   ?We will place her in a tall walking boot and she will begin weightbearing at that point ? ? ?TOURNIQUET TIME: Less than 2 hours ? ?BLOOD LOSS:  Minimal ?        ?DRAINS: none ?        ?SPECIMEN: none ?      ?COMPLICATIONS:  * No complications entered in OR log * ?        ?Disposition: PACU - hemodynamically stable. ?        ?Condition: stable  ?

## 2022-02-03 NOTE — Transfer of Care (Signed)
Immediate Anesthesia Transfer of Care Note ? ?Patient: Christina Evans ? ?Procedure(s) Performed: OPEN TREATMENT OF LEFT BIMALLEOLAR ANKLE FRACTURE (Left: Ankle) ?SYNDESMOSIS (Left) ? ?Patient Location: PACU ? ?Anesthesia Type:GA combined with regional for post-op pain ? ?Level of Consciousness: drowsy ? ?Airway & Oxygen Therapy: Patient Spontanous Breathing and Patient connected to nasal cannula oxygen ? ?Post-op Assessment: Report given to RN and Post -op Vital signs reviewed and stable ? ?Post vital signs: Reviewed and stable ? ?Last Vitals:  ?Vitals Value Taken Time  ?BP 129/62 02/03/22 1100  ?Temp    ?Pulse 88 02/03/22 1102  ?Resp 26 02/03/22 1102  ?SpO2 99 % 02/03/22 1102  ?Vitals shown include unvalidated device data. ? ?Last Pain:  ?Vitals:  ? 02/03/22 0804  ?TempSrc:   ?PainSc: 0-No pain  ?   ? ?  ? ?Complications: No notable events documented. ?

## 2022-02-03 NOTE — Anesthesia Procedure Notes (Signed)
Anesthesia Regional Block: Popliteal block  ? ?Pre-Anesthetic Checklist: , timeout performed,  Correct Patient, Correct Site, Correct Laterality,  Correct Procedure, Correct Position, site marked,  Risks and benefits discussed,  Surgical consent,  Pre-op evaluation,  At surgeon's request and post-op pain management ? ?Laterality: Left ? ?Prep: chloraprep     ?  ?Needles:  ?Injection technique: Single-shot ? ?Needle Type: Stimiplex   ? ? ?Needle Length: 9cm  ?Needle Gauge: 21  ? ? ? ?Additional Needles: ? ? ?Procedures:,,,, ultrasound used (permanent image in chart),,    ?Narrative:  ?Start time: 02/03/2022 8:59 AM ?End time: 02/03/2022 9:04 AM ?Injection made incrementally with aspirations every 5 mL. ? ?Performed by: Personally  ?Anesthesiologist: Lynda Rainwater, MD ? ? ? ? ?

## 2022-02-03 NOTE — H&P (Signed)
PREOPERATIVE H&P ? ?Chief Complaint: Left bimalleolar ankle fracture ? ?HPI: ?Christina Evans is a 61 y.o. female who presents for preoperative history and physical with a diagnosis of left bimalleolar ankle fracture.  She got her foot caught while trying to get out of the car.  She has a history of a stroke and has some left-sided weakness.  She does use that leg for ambulating and for standing.  She is here today for surgical intervention for her fracture.  She does have a history of diabetes. Symptoms are rated as moderate to severe, and have been worsening.  This is significantly impairing activities of daily living.  She has elected for surgical management.  ? ?Past Medical History:  ?Diagnosis Date  ? Asthma   ? CVA (cerebral vascular accident) ([place])   ? 2008 and 2020; left spastic hemiplegia  ? Dementia ([place])   ? Diabetes mellitus without complication ([place])   ? ?Past Surgical History:  ?Procedure Laterality Date  ? IR ANGIO INTRA EXTRACRAN SEL COM CAROTID INNOMINATE BILAT MOD SED  09/15/2019  ? IR ANGIO VERTEBRAL SEL VERTEBRAL UNI L MOD SED  09/15/2019  ? ?Social History  ? ?Socioeconomic History  ? Marital status: Single  ?  Spouse name: Not on file  ? Number of children: Not on file  ? Years of education: Not on file  ? Highest education level: Not on file  ?Occupational History  ? Not on file  ?Tobacco Use  ? Smoking status: Former  ? Smokeless tobacco: Never  ? Tobacco comments:  ?  QUIT IN 2008  ?Vaping Use  ? Vaping Use: Never used  ?Substance and Sexual Activity  ? Alcohol use: Yes  ?  Comment: RARE  ? Drug use: No  ? Sexual activity: Not Currently  ?Other Topics Concern  ? Not on file  ?Social History Narrative  ? Lives at home with significant other (not married).    ? Stays in close communication with 2 of her children who live in [place].    ? She has multiple other children who currently live in [place].   ?  Former smoker, quit in 2008.   ? Right handed  ? No caffeine  ? ?Social  Determinants of Health  ? ?Financial Resource Strain: Not on file  ?Food Insecurity: Not on file  ?Transportation Needs: Not on file  ?Physical Activity: Not on file  ?Stress: Not on file  ?Social Connections: Not on file  ? ?Family History  ?Problem Relation Age of Onset  ? Hypertension Mother   ? Hypertension Father   ? ?Allergies  ?Allergen Reactions  ? Tape Itching and Other (See Comments)  ?  Causes a lot of scratching, also  ? ?Prior to Admission medications   ?Medication Sig Start Date End Date Taking? Authorizing Provider  ?acetaminophen (TYLENOL) 500 MG tablet Take 500 mg by mouth every 6 (six) hours as needed for moderate pain or mild pain.   Yes [provider]  ?albuterol (PROAIR HFA) 108 (90 Base) MCG/ACT inhaler Inhale 2 puffs into the lungs 4 (four) times daily as needed for wheezing or shortness of breath. 10/06/19  Yes Hennie Duos, MD  ?atorvastatin (LIPITOR) 80 MG tablet TAKE 1 TABLET BY MOUTH ONCE DAILY. ?Patient taking differently: Take 40 mg by mouth at bedtime. 02/21/21 02/21/22 Yes Clovia Cuff, MD  ?Bromfenac Sodium (PROLENSA) 0.07 % SOLN Place 1 drop into the right eye 4 (four) times daily. 10/27/21  Yes Bernarda Caffey, MD  ?  Cholecalciferol (VITAMIN D3) 50 MCG (2000 UT) TABS Take 2,000 Units by mouth daily.   Yes [provider]  ?citalopram (CELEXA) 40 MG tablet TAKE 1 TABLET BY MOUTH ONCE DAILY. ?Patient taking differently: Take 20 mg by mouth daily. 11/25/20 02/03/22 Yes Clovia Cuff, MD  ?clopidogrel (PLAVIX) 75 MG tablet Take 1 tablet (75 mg total) by mouth daily. 10/06/19  Yes Hennie Duos, MD  ?HYDROcodone-acetaminophen (NORCO/VICODIN) 5-325 MG tablet Take 0.5 tablets by mouth every 6 (six) hours as needed for moderate pain.   Yes [provider]  ?insulin lispro (HUMALOG) 100 UNIT/ML injection Inject 0.15 mLs (15 Units total) into the skin 3 (three) times daily before meals. ?Patient taking differently: Inject 5 Units into the skin 3 (three) times  daily with meals as needed for high blood sugar. 10/20/19  Yes Charlesetta Shanks, MD  ?melatonin 5 MG TABS Take 5 mg by mouth at bedtime.   Yes [provider]  ?metFORMIN (GLUCOPHAGE-XR) 500 MG 24 hr tablet Take 2 tablets (1,000 mg total) by mouth 2 (two) times daily. 10/06/19  Yes Hennie Duos, MD  ?tamsulosin (FLOMAX) 0.4 MG CAPS capsule Take 0.4 mg by mouth daily.   Yes [provider]  ?trimethoprim (TRIMPEX) 100 MG tablet Take 100 mg by mouth daily.   Yes [provider]  ?blood glucose meter kit and supplies Dispense based on patient and insurance preference. Use up to four times daily as directed. (FOR ICD-9 250.00, 250.01). 09/02/17   Reino Bellis B, NP  ?glucose blood test strip Use as instructed 10/15/19   Kerin Perna, NP  ?Lancets (ACCU-CHEK SOFT TOUCH) lancets Use as instructed 10/15/19   Kerin Perna, NP  ? ? ? ?Positive ROS: All other systems have been reviewed and were otherwise negative with the exception of those mentioned in the HPI and as above. ? ?Physical Exam:  ?Vitals:  ? 02/03/22 0651  ?BP: (!) 168/81  ?Pulse: 92  ?Resp: 18  ?Temp: 97.7 ?F (36.5 ?C)  ?SpO2: 100%  ? ?General: Alert, no acute distress ?Cardiovascular: No pedal edema ?Respiratory: No cyanosis, no use of accessory musculature ?GI: No organomegaly, abdomen is soft and non-tender ?Skin: No lesions in the area of chief complaint ?Neurologic: Sensation intact distally ?Psychiatric: Patient is competent for consent with normal mood and affect ?Lymphatic: No axillary or cervical lymphadenopathy ? ?MUSCULOSKELETAL: Left leg in a short leg splint.  Onychomycosis present.  Swelling to the forefoot.  No tenderness proximal to the splint near the level of the knee.  She is able to wiggle her toes.  Endorses sensation to light touch.  Foot is warm and well-perfused. ? ?Assessment: ?Left bimalleolar ankle fracture, displaced ? ? ?Plan: ?Plan for open treatment of her left bimalleolar ankle  fracture.  She does have a history of diabetes and understands the increased risk with wound healing complications, infection and overall poor outcome.  She does have a displaced fracture and therefore surgical intervention is indicated.  She may require syndesmotic fixation given the level of the fracture but we will stress the ankle after fixation of the fibula and medial malleolus to determine instability at the syndesmosis. ? ?We discussed the risks, benefits and alternatives of surgery which include but are not limited to wound healing complications, infection, nonunion, malunion, need for further surgery, damage to surrounding structures and continued pain.  They understand there is no guarantees to an acceptable outcome.  After weighing these risks they opted to proceed with surgery. ? ? ? ? ?  Erle Crocker, MD ? ? ? ?02/03/2022 ?8:07 AM  ? ?

## 2022-02-03 NOTE — Anesthesia Procedure Notes (Signed)
Anesthesia Regional Block: Adductor canal block  ? ?Pre-Anesthetic Checklist: , timeout performed,  Correct Patient, Correct Site, Correct Laterality,  Correct Procedure, Correct Position, site marked,  Risks and benefits discussed,  Surgical consent,  Pre-op evaluation,  At surgeon's request and post-op pain management ? ?Laterality: Left ? ?Prep: chloraprep     ?  ?Needles:  ?Injection technique: Single-shot ? ?Needle Type: Stimiplex   ? ? ?Needle Length: 9cm  ?Needle Gauge: 21  ? ? ? ?Additional Needles: ? ? ?Procedures:,,,, ultrasound used (permanent image in chart),,    ?Narrative:  ?Start time: 02/03/2022 8:59 AM ?End time: 02/03/2022 9:04 AM ?Injection made incrementally with aspirations every 5 mL. ? ?Performed by: Personally  ?Anesthesiologist: Lowella Curb, MD ? ? ? ? ?

## 2022-02-03 NOTE — Anesthesia Preprocedure Evaluation (Signed)
Anesthesia Evaluation  ?Patient identified by MRN, date of birth, ID band ?Patient awake ? ? ? ?Reviewed: ?Allergy & Precautions, NPO status , Patient's Chart, lab work & pertinent test results ? ?Airway ?Mallampati: II ? ?TM Distance: >3 FB ?Neck ROM: Full ? ? ? Dental ?no notable dental hx. ? ?  ?Pulmonary ?asthma , former smoker,  ?  ?Pulmonary exam normal ?breath sounds clear to auscultation ? ? ? ? ? ? Cardiovascular ?hypertension, Normal cardiovascular exam ?Rhythm:Regular Rate:Normal ? ? ?  ?Neuro/Psych ?Depression Dementia CVA negative psych ROS  ? GI/Hepatic ?negative GI ROS, Neg liver ROS,   ?Endo/Other  ?negative endocrine ROSdiabetes, Type 2 ? Renal/GU ?negative Renal ROS  ?negative genitourinary ?  ?Musculoskeletal ?negative musculoskeletal ROS ?(+)  ? Abdominal ?  ?Peds ?negative pediatric ROS ?(+)  Hematology ?negative hematology ROS ?(+)   ?Anesthesia Other Findings ? ? Reproductive/Obstetrics ?negative OB ROS ? ?  ? ? ? ? ? ? ? ? ? ? ? ? ? ?  ?  ? ? ? ? ? ? ? ? ?Anesthesia Physical ?Anesthesia Plan ? ?ASA: 3 ? ?Anesthesia Plan: General  ? ?Post-op Pain Management: Regional block*, Dilaudid IV and Minimal or no pain anticipated  ? ?Induction: Intravenous ? ?PONV Risk Score and Plan: 3 and Ondansetron, Dexamethasone, Midazolam and Treatment may vary due to age or medical condition ? ?Airway Management Planned: LMA ? ?Additional Equipment:  ? ?Intra-op Plan:  ? ?Post-operative Plan: Extubation in OR ? ?Informed Consent: I have reviewed the patients History and Physical, chart, labs and discussed the procedure including the risks, benefits and alternatives for the proposed anesthesia with the patient or authorized representative who has indicated his/her understanding and acceptance.  ? ? ? ?Dental advisory given ? ?Plan Discussed with: CRNA ? ?Anesthesia Plan Comments:   ? ? ? ? ? ? ?Anesthesia Quick Evaluation ? ?

## 2022-02-04 ENCOUNTER — Encounter (HOSPITAL_COMMUNITY): Payer: Self-pay | Admitting: Orthopaedic Surgery

## 2022-02-04 NOTE — Progress Notes (Signed)
?[facility] Note ? ?02/11/2022 ? ?  ? ?CHIEF COMPLAINT ?Patient presents for Retina Follow Up ? ? ? ?HISTORY OF PRESENT ILLNESS: ?Christina Evans is a 61 y.o. female who presents to the clinic today for:  ? ?HPI   ? ? Retina Follow Up   ?Patient presents with  Other (CME).  In right eye.  Severity is moderate.  Duration of 7 weeks.  Since onset it is stable.  I, the attending physician,  performed the HPI with the patient and updated documentation appropriately. ? ?  ?  ? ? Comments   ?Patient states vision the same OU. BS was 168 last pm. Using prolensa only qid OD.  ? ?  ?  ?Last edited by Bernarda Caffey, MD on 02/16/2022 12:42 AM.  ?  ?Pt broke her foot while on a trip to [place], pt is using Prolensa QID OD only, her daughter states [facility] did not give her the PF ? ?Referring physician: ?[facility], [place] ?[place],  [place] [address] ? ?HISTORICAL INFORMATION:  ? ?Selected notes from the [facility] ?Referred by Dr. Midge Aver for concern of cataract clearance / NPDR ?LEE:  ?Ocular Hx- ?PMH- ?  ? ?CURRENT MEDICATIONS: ?Current Outpatient Medications (Ophthalmic Drugs)  ?Medication Sig  ? Bromfenac Sodium (PROLENSA) 0.07 % SOLN Place 1 drop into both eyes 4 (four) times daily.  ? prednisoLONE acetate (PRED FORTE) 1 % ophthalmic suspension Place 1 drop into both eyes 4 (four) times daily.  ? ?No current facility-administered medications for this visit. (Ophthalmic Drugs)  ? ?Current Outpatient Medications (Other)  ?Medication Sig  ? acetaminophen (TYLENOL) 500 MG tablet Take 500 mg by mouth every 6 (six) hours as needed for moderate pain or mild pain.  ? albuterol (PROAIR HFA) 108 (90 Base) MCG/ACT inhaler Inhale 2 puffs into the lungs 4 (four) times daily as needed for wheezing or shortness of breath.  ? atorvastatin (LIPITOR) 80 MG tablet TAKE 1 TABLET BY MOUTH ONCE DAILY. (Patient taking differently: Take 40 mg by mouth at bedtime.)  ? blood  glucose meter kit and supplies Dispense based on patient and insurance preference. Use up to four times daily as directed. (FOR ICD-9 250.00, 250.01).  ? Cholecalciferol (VITAMIN D3) 50 MCG (2000 UT) TABS Take 2,000 Units by mouth daily.  ? clopidogrel (PLAVIX) 75 MG tablet Take 1 tablet (75 mg total) by mouth daily.  ? glucose blood test strip Use as instructed  ? HYDROcodone-acetaminophen (NORCO/VICODIN) 5-325 MG tablet Take 0.5 tablets by mouth every 6 (six) hours as needed for moderate pain.  ? insulin lispro (HUMALOG) 100 UNIT/ML injection Inject 0.15 mLs (15 Units total) into the skin 3 (three) times daily before meals. (Patient taking differently: Inject 5 Units into the skin 3 (three) times daily with meals as needed for high blood sugar.)  ? Lancets (ACCU-CHEK SOFT TOUCH) lancets Use as instructed  ? melatonin 5 MG TABS Take 5 mg by mouth at bedtime.  ? metFORMIN (GLUCOPHAGE-XR) 500 MG 24 hr tablet Take 2 tablets (1,000 mg total) by mouth 2 (two) times daily.  ? tamsulosin (FLOMAX) 0.4 MG CAPS capsule Take 0.4 mg by mouth daily.  ? trimethoprim (TRIMPEX) 100 MG tablet Take 100 mg by mouth daily.  ? citalopram (CELEXA) 40 MG tablet TAKE 1 TABLET BY MOUTH ONCE DAILY. (Patient taking differently: Take 20 mg by mouth daily.)  ? ?No current facility-administered medications for this  visit. (Other)  ? ?REVIEW OF SYSTEMS: ?ROS   ?Positive for: Neurological, Musculoskeletal, Endocrine, Eyes, Respiratory, Psychiatric ?Negative for: Constitutional, Gastrointestinal, Skin, Genitourinary, HENT, Cardiovascular, Allergic/Imm, Heme/Lymph ?Last edited by Jobe Marker, COT on 02/11/2022  8:44 AM.  ?  ? ?ALLERGIES ?Allergies  ?Allergen Reactions  ? Tape Itching and Other (See Comments)  ?  Causes a lot of scratching, also  ? ?PAST MEDICAL HISTORY ?Past Medical History:  ?Diagnosis Date  ? Asthma   ? CVA (cerebral vascular accident) ([place])   ? 2008 and 2020; left spastic hemiplegia  ? Dementia ([place])   ? Diabetes mellitus  without complication ([place])   ? ?Past Surgical History:  ?Procedure Laterality Date  ? IR ANGIO INTRA EXTRACRAN SEL COM CAROTID INNOMINATE BILAT MOD SED  09/15/2019  ? IR ANGIO VERTEBRAL SEL VERTEBRAL UNI L MOD SED  09/15/2019  ? ORIF ANKLE FRACTURE Left 02/03/2022  ? Procedure: OPEN TREATMENT OF LEFT BIMALLEOLAR ANKLE FRACTURE;  Surgeon: Erle Crocker, MD;  Location: [facility];  Service: Orthopedics;  Laterality: Left;  LENGTH OF SURGERY: 90 MINUTES ?POSITION: SUPINE  ? SYNDESMOSIS REPAIR Left 02/03/2022  ? Procedure: SYNDESMOSIS;  Surgeon: Erle Crocker, MD;  Location: [facility];  Service: Orthopedics;  Laterality: Left;  LENGTH OF SURGERY: 90 MINUTES ?POSITION: SUPINE  ? ?FAMILY HISTORY ?Family History  ?Problem Relation Age of Onset  ? Hypertension Mother   ? Hypertension Father   ? ?SOCIAL HISTORY ?Social History  ? ?Tobacco Use  ? Smoking status: Former  ? Smokeless tobacco: Never  ? Tobacco comments:  ?  QUIT IN 2008  ?Vaping Use  ? Vaping Use: Never used  ?Substance Use Topics  ? Alcohol use: Yes  ?  Comment: RARE  ? Drug use: No  ?  ? ?  ?OPHTHALMIC EXAM: ? ?Base Eye Exam   ? ? Visual Acuity (Snellen - Linear)   ? ?   Right Left  ? Dist [place] 20/80 -2 20/40 -1  ? Dist ph [place] 20/50 20/30 -1  ? ?  ?  ? ? Tonometry (Tonopen, 9:00 AM)   ? ?   Right Left  ? Pressure 17 18  ? ?  ?  ? ? Pupils   ? ?   Dark Light Shape React APD  ? Right 3 2 Round Minimal None  ? Left 3 2 Round Minimal None  ? ?  ?  ? ? Visual Fields (Counting fingers)   ? ?   Left Right  ?  Full Full  ? ?  ?  ? ? Extraocular Movement   ? ?   Right Left  ?  Full, Ortho Full, Ortho  ? ?  ?  ? ? Neuro/Psych   ? ? Oriented x3: Yes  ? Mood/Affect: Normal  ? ?  ?  ? ?  ? ?Slit Lamp and Fundus Exam   ? ? Slit Lamp Exam   ? ?   Right Left  ? Lids/Lashes Dermatochalasis - upper lid, mild MGD Dermatochalasis - upper lid, mild MGD  ? Conjunctiva/Sclera temporal pinguecula temporal pinguecula  ? Cornea trace PEE Clear  ? Anterior Chamber Deep and quiet Deep  and quiet  ? Iris Round and dilated, No NVI Round and dilated, No NVI  ? Lens 2+ Nuclear sclerosis, 3+ Cortical cataract 2+ Nuclear sclerosis, 3+ Cortical cataract  ? Anterior Vitreous Vitreous syneresis Vitreous syneresis  ? ?  ?  ? ? Fundus Exam   ? ?  Right Left  ? Disc Trace Pallor, Sharp rim Pink and Sharp  ? C/D Ratio 0.6 0.5  ? Macula Flat, Blunted foveal reflex, central Cystic changes / edema -- slightly improved, scattered MA Flat, blunted foveal reflex, interval increase in central edema, scattered MA, cluster of DBH temporal macula  ? Vessels attenuated, mild tortuousity mild attenuation  ? Periphery Attached, scattered MA Attached, scattered MA  ? ?  ?  ? ?  ? ? ?IMAGING AND PROCEDURES  ?Imaging and Procedures for 02/11/2022 ? ?OCT, Retina - OU - Both Eyes   ? ?   ?Right Eye ?Quality was good. Central Foveal Thickness: 409. Progression has improved. Findings include abnormal foveal contour, intraretinal fluid, no SRF, vitreomacular adhesion (Interval improvement in central IRF, partial PVD).  ? ?Left Eye ?Quality was borderline. Central Foveal Thickness: 407. Progression has worsened. Findings include no SRF, intraretinal fluid, intraretinal hyper-reflective material, abnormal foveal contour, retinal drusen (Mild Interval increase in IRF, interval development of central SRF).  ? ?Notes ?*Images captured and stored on drive ? ?Diagnosis / Impression:  ?OD: Interval improvement in central IRF, partial PVD ?OS: Mild Interval increase in IRF, interval development of central SRF ? ?Clinical management:  ?See below ? ?Abbreviations: NFP - Normal foveal profile. CME - cystoid macular edema. PED - pigment epithelial detachment. IRF - intraretinal fluid. SRF - subretinal fluid. EZ - ellipsoid zone. ERM - epiretinal membrane. ORA - outer retinal atrophy. ORT - outer retinal tubulation. SRHM - subretinal hyper-reflective material. IRHM - intraretinal hyper-reflective material ? ? ?  ?  ?  ? ?  ?ASSESSMENT/PLAN: ? ?   ICD-10-CM   ?1. CME (cystoid macular edema), right  H35.351 OCT, Retina - OU - Both Eyes  ?  ?2. 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

## 2022-02-05 ENCOUNTER — Encounter (INDEPENDENT_AMBULATORY_CARE_PROVIDER_SITE_OTHER): Payer: Medicare (Managed Care) | Admitting: Ophthalmology

## 2022-02-05 DIAGNOSIS — H35033 Hypertensive retinopathy, bilateral: Secondary | ICD-10-CM

## 2022-02-05 DIAGNOSIS — I10 Essential (primary) hypertension: Secondary | ICD-10-CM

## 2022-02-05 DIAGNOSIS — H35351 Cystoid macular degeneration, right eye: Secondary | ICD-10-CM

## 2022-02-05 DIAGNOSIS — E113311 Type 2 diabetes mellitus with moderate nonproliferative diabetic retinopathy with macular edema, right eye: Secondary | ICD-10-CM

## 2022-02-05 DIAGNOSIS — H25813 Combined forms of age-related cataract, bilateral: Secondary | ICD-10-CM

## 2022-02-05 DIAGNOSIS — E113313 Type 2 diabetes mellitus with moderate nonproliferative diabetic retinopathy with macular edema, bilateral: Secondary | ICD-10-CM

## 2022-02-11 ENCOUNTER — Other Ambulatory Visit: Payer: Self-pay

## 2022-02-11 ENCOUNTER — Ambulatory Visit (INDEPENDENT_AMBULATORY_CARE_PROVIDER_SITE_OTHER): Payer: Medicare (Managed Care) | Admitting: Ophthalmology

## 2022-02-11 ENCOUNTER — Encounter (INDEPENDENT_AMBULATORY_CARE_PROVIDER_SITE_OTHER): Payer: Self-pay | Admitting: Ophthalmology

## 2022-02-11 DIAGNOSIS — H35033 Hypertensive retinopathy, bilateral: Secondary | ICD-10-CM

## 2022-02-11 DIAGNOSIS — H35351 Cystoid macular degeneration, right eye: Secondary | ICD-10-CM

## 2022-02-11 DIAGNOSIS — I10 Essential (primary) hypertension: Secondary | ICD-10-CM | POA: Diagnosis not present

## 2022-02-11 DIAGNOSIS — E113313 Type 2 diabetes mellitus with moderate nonproliferative diabetic retinopathy with macular edema, bilateral: Secondary | ICD-10-CM

## 2022-02-11 DIAGNOSIS — H25813 Combined forms of age-related cataract, bilateral: Secondary | ICD-10-CM

## 2022-02-11 MED ORDER — PREDNISOLONE ACETATE 1 % OP SUSP: 1.0000 [drp] | Freq: Four times a day (QID) | OPHTHALMIC | 0 refills | Status: DC

## 2022-02-11 MED ORDER — PROLENSA 0.07 % OP SOLN: 1.0000 [drp] | Freq: Four times a day (QID) | OPHTHALMIC | 5 refills | Status: DC

## 2022-02-16 ENCOUNTER — Encounter (INDEPENDENT_AMBULATORY_CARE_PROVIDER_SITE_OTHER): Payer: Self-pay | Admitting: Ophthalmology

## 2022-03-12 NOTE — Progress Notes (Shared)
?[facility] Note ? ?03/18/2022 ? ?  ? ?CHIEF COMPLAINT ?Patient presents for No chief complaint on file. ? ? ? ?HISTORY OF PRESENT ILLNESS: ?Christina Evans is a 61 y.o. female who presents to the clinic today for:  ? ? ? ?Referring physician: ?[facility], [place] ?[place],  [place] [address] ? ?HISTORICAL INFORMATION:  ? ?Selected notes from the [facility] ?Referred by Dr. Midge Aver for concern of cataract clearance / NPDR ?LEE:  ?Ocular Hx- ?PMH- ?  ? ?CURRENT MEDICATIONS: ?Current Outpatient Medications (Ophthalmic Drugs)  ?Medication Sig  ? Bromfenac Sodium (PROLENSA) 0.07 % SOLN Place 1 drop into both eyes 4 (four) times daily.  ? prednisoLONE acetate (PRED FORTE) 1 % ophthalmic suspension Place 1 drop into both eyes 4 (four) times daily.  ? ?No current facility-administered medications for this visit. (Ophthalmic Drugs)  ? ?Current Outpatient Medications (Other)  ?Medication Sig  ? acetaminophen (TYLENOL) 500 MG tablet Take 500 mg by mouth every 6 (six) hours as needed for moderate pain or mild pain.  ? albuterol (PROAIR HFA) 108 (90 Base) MCG/ACT inhaler Inhale 2 puffs into the lungs 4 (four) times daily as needed for wheezing or shortness of breath.  ? atorvastatin (LIPITOR) 80 MG tablet TAKE 1 TABLET BY MOUTH ONCE DAILY. (Patient taking differently: Take 40 mg by mouth at bedtime.)  ? blood glucose meter kit and supplies Dispense based on patient and insurance preference. Use up to four times daily as directed. (FOR ICD-9 250.00, 250.01).  ? Cholecalciferol (VITAMIN D3) 50 MCG (2000 UT) TABS Take 2,000 Units by mouth daily.  ? citalopram (CELEXA) 40 MG tablet TAKE 1 TABLET BY MOUTH ONCE DAILY. (Patient taking differently: Take 20 mg by mouth daily.)  ? clopidogrel (PLAVIX) 75 MG tablet Take 1 tablet (75 mg total) by mouth daily.  ? glucose blood test strip Use as instructed  ? HYDROcodone-acetaminophen (NORCO/VICODIN) 5-325 MG  tablet Take 0.5 tablets by mouth every 6 (six) hours as needed for moderate pain.  ? insulin lispro (HUMALOG) 100 UNIT/ML injection Inject 0.15 mLs (15 Units total) into the skin 3 (three) times daily before meals. (Patient taking differently: Inject 5 Units into the skin 3 (three) times daily with meals as needed for high blood sugar.)  ? Lancets (ACCU-CHEK SOFT TOUCH) lancets Use as instructed  ? melatonin 5 MG TABS Take 5 mg by mouth at bedtime.  ? metFORMIN (GLUCOPHAGE-XR) 500 MG 24 hr tablet Take 2 tablets (1,000 mg total) by mouth 2 (two) times daily.  ? tamsulosin (FLOMAX) 0.4 MG CAPS capsule Take 0.4 mg by mouth daily.  ? trimethoprim (TRIMPEX) 100 MG tablet Take 100 mg by mouth daily.  ? ?No current facility-administered medications for this visit. (Other)  ? ?REVIEW OF SYSTEMS: ? ? ?ALLERGIES ?Allergies  ?Allergen Reactions  ? Tape Itching and Other (See Comments)  ?  Causes a lot of scratching, also  ? ?PAST MEDICAL HISTORY ?Past Medical History:  ?Diagnosis Date  ? Asthma   ? CVA (cerebral vascular accident) ([facility])   ? 2008 and 2020; left spastic hemiplegia  ? Dementia ([place])   ? Diabetes mellitus without complication ([place])   ? ?Past Surgical History:  ?Procedure Laterality Date  ? IR ANGIO INTRA EXTRACRAN SEL COM CAROTID INNOMINATE BILAT MOD SED  09/15/2019  ? IR ANGIO VERTEBRAL SEL VERTEBRAL UNI L MOD SED  09/15/2019  ? ORIF ANKLE FRACTURE Left 02/03/2022  ?  Procedure: OPEN TREATMENT OF LEFT BIMALLEOLAR ANKLE FRACTURE;  Surgeon: Erle Crocker, MD;  Location: [facility];  Service: Orthopedics;  Laterality: Left;  LENGTH OF SURGERY: 90 MINUTES ?POSITION: SUPINE  ? SYNDESMOSIS REPAIR Left 02/03/2022  ? Procedure: SYNDESMOSIS;  Surgeon: Erle Crocker, MD;  Location: [facility];  Service: Orthopedics;  Laterality: Left;  LENGTH OF SURGERY: 90 MINUTES ?POSITION: SUPINE  ? ?FAMILY HISTORY ?Family History  ?Problem Relation Age of Onset  ? Hypertension Mother   ? Hypertension Father   ? ?SOCIAL HISTORY ?Social  History  ? ?Tobacco Use  ? Smoking status: Former  ? Smokeless tobacco: Never  ? Tobacco comments:  ?  QUIT IN 2008  ?Vaping Use  ? Vaping Use: Never used  ?Substance Use Topics  ? Alcohol use: Yes  ?  Comment: RARE  ? Drug use: No  ?  ? ?  ?OPHTHALMIC EXAM: ? ?Not recorded ?  ? ? ?IMAGING AND PROCEDURES  ?Imaging and Procedures for 03/18/2022 ? ? ?  ?  ? ?  ?ASSESSMENT/PLAN: ? ?No diagnosis found. ? ?1. CME OD ? - OCT shows central IRF/edema OD -- mild increase ? - BCVA OD 20/70 from 20/50 ? - FA (1.4.23) shows petaloid leakage consistent with CME OD + mild leaking MA OU ? - will continue PF and Prolensa QID OD for CME component -- pt has only been using Prolensa, [facility] did not give them PF ? - discussed importance of using PF and Prolensa - will get PF from [facility] and start using both PF and Prolensa in both eyes ? - discussed possibility of periocular and intravitreal steroids if CME fails to improve / resolve ? - recommend intravitreal injection for possible DME component, but pt refused ? - f/u 4-6 wks for DFE, OCT ? ?2. Moderate nonproliferative diabetic retinopathy OU ? - mild cystic changes/ DME OU ?- exam w/ scattered MA OU ?- FA shows mild late leaking MA OU ?- OCT OD: Interval improvement in central IRF, partial PVD; OS: Mild Interval increase in IRF, interval development of central SRF ?- recommend IVA today, but pt refused injection ?- f/u in 4-6 wks -- DFE/OCT ? ?3,4. Hypertensive retinopathy OU ?- discussed importance of tight BP control ?- monitor ? ?5. Mixed Cataract OU ?- The symptoms of cataract, surgical options, and treatments and risks were discussed with patient. ?- discussed diagnosis and progression ?- under the expert management of Dr. Shirleen Schirmer ?- clear from a retina standpoint to proceed with cataract surgery OS when pt and surgeon are ready ?- recommend holding off on OD until work up is completed and treatment plan is initiated ? ?Ophthalmic Meds Ordered this visit:  ?No orders of the  defined types were placed in this encounter. ?  ? ?No follow-ups on file. ? ?There are no Patient Instructions on file for this visit. ? ? ?Explained the diagnoses, plan, and follow up with the patient and they expressed understanding.  Patient expressed understanding of the importance of proper follow up care.  ? ?This document serves as a record of services personally performed by Gardiner Sleeper, MD, PhD. It was created on their behalf by Orvan Falconer, an ophthalmic technician. The creation of this record is the provider's dictation and/or activities during the visit.   ? ?Electronically signed by: Orvan Falconer, OA, 03/12/22  1:26 PM ? ? ?Gardiner Sleeper, M.D., Ph.D. ?Diseases & Surgery of the Retina and Vitreous ?[facility] ? ?I have  reviewed the above documentation for accuracy and completeness, and I agree with the above. Gardiner Sleeper, M.D., Ph.D. 02/16/22 1:26 PM ? ? ?Abbreviations: ?M myopia (nearsighted); A astigmatism; H hyperopia (farsighted); P presbyopia; Mrx spectacle prescription;  CTL contact lenses; OD right eye; OS left eye; OU both eyes  XT exotropia; ET esotropia; PEK punctate epithelial keratitis; PEE punctate epithelial erosions; DES dry eye syndrome; MGD meibomian gland dysfunction; ATs artificial tears; PFAT's preservative free artificial tears; [place] nuclear sclerotic cataract; PSC posterior subcapsular cataract; ERM epi-retinal membrane; PVD posterior vitreous detachment; RD retinal detachment; DM diabetes mellitus; DR diabetic retinopathy; NPDR non-proliferative diabetic retinopathy; PDR proliferative diabetic retinopathy; CSME clinically significant macular edema; DME diabetic macular edema; dbh dot blot hemorrhages; CWS cotton wool spot; POAG primary open angle glaucoma; C/D cup-to-disc ratio; HVF humphrey visual field; GVF goldmann visual field; OCT optical coherence tomography; IOP intraocular pressure; BRVO Branch retinal vein occlusion; CRVO central  retinal vein occlusion; CRAO central retinal artery occlusion; BRAO branch retinal artery occlusion; RT retinal tear; SB scleral buckle; PPV pars plana vitrectomy; VH Vitreous hemorrhage; PRP panretinal lase

## 2022-03-18 ENCOUNTER — Encounter (INDEPENDENT_AMBULATORY_CARE_PROVIDER_SITE_OTHER): Payer: Medicare (Managed Care) | Admitting: Ophthalmology

## 2022-03-18 DIAGNOSIS — E113311 Type 2 diabetes mellitus with moderate nonproliferative diabetic retinopathy with macular edema, right eye: Secondary | ICD-10-CM

## 2022-03-18 DIAGNOSIS — H25813 Combined forms of age-related cataract, bilateral: Secondary | ICD-10-CM

## 2022-03-18 DIAGNOSIS — H35033 Hypertensive retinopathy, bilateral: Secondary | ICD-10-CM

## 2022-03-18 DIAGNOSIS — E113313 Type 2 diabetes mellitus with moderate nonproliferative diabetic retinopathy with macular edema, bilateral: Secondary | ICD-10-CM

## 2022-03-18 DIAGNOSIS — I10 Essential (primary) hypertension: Secondary | ICD-10-CM

## 2022-03-18 DIAGNOSIS — H35351 Cystoid macular degeneration, right eye: Secondary | ICD-10-CM

## 2022-07-28 ENCOUNTER — Other Ambulatory Visit: Payer: Self-pay | Admitting: Family Medicine

## 2022-07-28 DIAGNOSIS — Z1231 Encounter for screening mammogram for malignant neoplasm of breast: Secondary | ICD-10-CM

## 2022-08-07 NOTE — Progress Notes (Signed)
[facility] Note  08/14/2022     CHIEF COMPLAINT Patient presents for Retina Follow Up    HISTORY OF PRESENT ILLNESS: Christina Evans is a 61 y.o. female who presents to the clinic today for:   HPI     Retina Follow Up   Patient presents with  Other (CME).  In right eye.  This started months ago.  Severity is moderate.  Duration of 6 months.  Since onset it is stable.  I, the attending physician,  performed the HPI with the patient and updated documentation appropriately.        Comments   Patient feels that the vision is the same since her last appointment 6 months ago. She is unsure of her blood sugar and the A1c as well.       Last edited by Bernarda Caffey, MD on 08/14/2022  3:38 PM.    Pt is delayed to follow up from 4-6 weeks to 6 months, due to traveling back and forth to [place] with her daughter, pts husband died in 08-Jul-2023 from kidney cancer and her daughter found out she is pregnant in 07-08-2023 as well, pt is not using any drops at this time, pt has not had any new health problems, pt has not been checking her blood sugar bc she left the meter in [place]   Referring physician: [facility] [address] [place],  [place] [address]  HISTORICAL INFORMATION:   Selected notes from the [facility] [facility] Referred by Dr. Midge Aver for concern of cataract clearance / NPDR [facility]:  Ocular Hx- PMH-    CURRENT MEDICATIONS: Current Outpatient Medications (Ophthalmic Drugs)  Medication Sig   Bromfenac Sodium (PROLENSA) 0.07 % SOLN Place 1 drop into both eyes 4 (four) times daily.   prednisoLONE acetate (PRED FORTE) 1 % ophthalmic suspension Place 1 drop into both eyes 4 (four) times daily.   No current facility-administered medications for this visit. (Ophthalmic Drugs)   Current Outpatient Medications (Other)  Medication Sig   acetaminophen (TYLENOL) 500 MG tablet Take 500 mg by mouth every 6 (six) hours as needed  for moderate pain or mild pain.   albuterol (PROAIR HFA) 108 (90 Base) MCG/ACT inhaler Inhale 2 puffs into the lungs 4 (four) times daily as needed for wheezing or shortness of breath.   atorvastatin (LIPITOR) 80 MG tablet TAKE 1 TABLET BY MOUTH ONCE DAILY. (Patient taking differently: Take 40 mg by mouth at bedtime.)   blood glucose meter kit and supplies Dispense based on patient and insurance preference. Use up to four times daily as directed. (FOR ICD-9 250.00, 250.01).   Cholecalciferol (VITAMIN D3) 50 MCG (2000 UT) TABS Take 2,000 Units by mouth daily.   citalopram (CELEXA) 40 MG tablet TAKE 1 TABLET BY MOUTH ONCE DAILY. (Patient taking differently: Take 20 mg by mouth daily.)   clopidogrel (PLAVIX) 75 MG tablet Take 1 tablet (75 mg total) by mouth daily.   glucose blood test strip Use as instructed   HYDROcodone-acetaminophen (NORCO/VICODIN) 5-325 MG tablet Take 0.5 tablets by mouth every 6 (six) hours as needed for moderate pain.   insulin lispro (HUMALOG) 100 UNIT/ML injection Inject 0.15 mLs (15 Units total) into the skin 3 (three) times daily before meals. (Patient taking differently: Inject 5 Units into the skin 3 (three) times daily with meals as needed for high blood sugar.)   Lancets (ACCU-CHEK SOFT TOUCH) lancets Use as instructed   melatonin  5 MG TABS Take 5 mg by mouth at bedtime.   metFORMIN (GLUCOPHAGE-XR) 500 MG 24 hr tablet Take 2 tablets (1,000 mg total) by mouth 2 (two) times daily.   tamsulosin (FLOMAX) 0.4 MG CAPS capsule Take 0.4 mg by mouth daily.   trimethoprim (TRIMPEX) 100 MG tablet Take 100 mg by mouth daily.   No current facility-administered medications for this visit. (Other)   REVIEW OF SYSTEMS: ROS   Positive for: Neurological, Musculoskeletal, Endocrine, Eyes, Respiratory, Psychiatric Negative for: Constitutional, Gastrointestinal, Skin, Genitourinary, HENT, Cardiovascular, Allergic/Imm, Heme/Lymph Last edited by Annie Paras, COT on 08/14/2022   9:20 AM.     ALLERGIES Allergies  Allergen Reactions   Tape Itching and Other (See Comments)    Causes a lot of scratching, also   PAST MEDICAL HISTORY Past Medical History:  Diagnosis Date   Asthma    CVA (cerebral vascular accident) ([place])    2008 and 2020; left spastic hemiplegia   Dementia ([place])    Diabetes mellitus without complication ([place])    Past Surgical History:  Procedure Laterality Date   IR ANGIO INTRA EXTRACRAN SEL COM CAROTID INNOMINATE BILAT MOD SED  09/15/2019   IR ANGIO VERTEBRAL SEL VERTEBRAL UNI L MOD SED  09/15/2019   ORIF ANKLE FRACTURE Left 02/03/2022   Procedure: OPEN TREATMENT OF LEFT BIMALLEOLAR ANKLE FRACTURE;  Surgeon: Erle Crocker, MD;  Location: [facility];  Service: Orthopedics;  Laterality: Left;  LENGTH OF SURGERY: 90 MINUTES POSITION: SUPINE   SYNDESMOSIS REPAIR Left 02/03/2022   Procedure: SYNDESMOSIS;  Surgeon: Erle Crocker, MD;  Location: [facility];  Service: Orthopedics;  Laterality: Left;  LENGTH OF SURGERY: 90 MINUTES POSITION: SUPINE   FAMILY HISTORY Family History  Problem Relation Age of Onset   Hypertension Mother    Hypertension Father    SOCIAL HISTORY Social History   Tobacco Use   Smoking status: Former   Smokeless tobacco: Never   Tobacco comments:    QUIT IN 2008  Vaping Use   Vaping Use: Never used  Substance Use Topics   Alcohol use: Yes    Comment: RARE   Drug use: No       OPHTHALMIC EXAM:  Base Eye Exam     Visual Acuity (Snellen - Linear)       Right Left   Dist [place] 20/50 20/50   Dist ph [place] NI NI         Tonometry     Unable to assess: Yes  Squeezing and turning head        Pupils       Dark Light Shape React APD   Right 3 2 Round Minimal None   Left 3 2 Round Minimal None         Visual Fields       Left Right    Full Full         Extraocular Movement       Right Left    Full, Ortho Full, Ortho         Neuro/Psych     Oriented x3: Yes   Mood/Affect: Normal          Dilation     Both eyes: 1.0% Mydriacyl, 2.5% Phenylephrine @ 9:22 AM           Slit Lamp and Fundus Exam     Slit Lamp Exam       Right Left   Lids/Lashes Dermatochalasis - upper lid, mild MGD Dermatochalasis -  upper lid, mild MGD   Conjunctiva/Sclera temporal pinguecula temporal pinguecula   Cornea Clear well healed cataract wound   Anterior Chamber Deep and quiet Deep and quiet   Iris Round and dilated, No NVI Round and dilated, No NVI   Lens 2-3+ Nuclear sclerosis, 3+ Cortical cataract PC IOL in good position   Anterior Vitreous Vitreous syneresis Vitreous syneresis         Fundus Exam       Right Left   Disc Trace Pallor, Sharp rim Pink and Sharp, mild PPA   C/D Ratio 0.6 0.5   Macula Flat, Blunted foveal reflex, central Cystic changes / edema -- slightly improved, scattered MA blunted foveal reflex, DBH and edema temporal macula, +CWS   Vessels attenuated, mild tortuosity attenuated, Tortuous   Periphery Attached, scattered MA Attached, scattered MA            IMAGING AND PROCEDURES  Imaging and Procedures for 08/14/2022  OCT, Retina - OU - Both Eyes       Right Eye Quality was borderline. Central Foveal Thickness: 352. Progression has improved. Findings include no SRF, abnormal foveal contour, intraretinal fluid, vitreomacular adhesion (Interval improvement in foveal contour and central IRF, partial PVD).   Left Eye Quality was borderline. Central Foveal Thickness: 538. Progression has worsened. Findings include no SRF, abnormal foveal contour, retinal drusen , intraretinal hyper-reflective material, intraretinal fluid (Interval increase in IRF temporal fovea and macula).   Notes *Images captured and stored on drive  Diagnosis / Impression:  OD: Interval improvement in foveal contour and central IRF, partial PVD OS: Interval increase in IRF temporal fovea and macula  Clinical management:  See below  Abbreviations: NFP - Normal foveal  profile. CME - cystoid macular edema. PED - pigment epithelial detachment. IRF - intraretinal fluid. SRF - subretinal fluid. EZ - ellipsoid zone. ERM - epiretinal membrane. ORA - outer retinal atrophy. ORT - outer retinal tubulation. SRHM - subretinal hyper-reflective material. IRHM - intraretinal hyper-reflective material      Intravitreal Injection, Pharmacologic Agent - OS - Left Eye       Time Out 08/14/2022. 10:02 AM. Confirmed correct patient, procedure, site, and patient consented.   Anesthesia Topical anesthesia was used. Anesthetic medications included Lidocaine 2%, Proparacaine 0.5%.   Procedure Preparation included 5% betadine to ocular surface, eyelid speculum. A (32g) needle was used.   Injection: 1.25 mg Bevacizumab 1.95m/0.05ml   Route: Intravitreal, Site: Left Eye   NDC:: 73710-626-94 Lot: [id number] Expiration date: 09/15/2022   Post-op Post injection exam found visual acuity of at least counting fingers. The patient tolerated the procedure well. There were no complications. The patient received written and verbal post procedure care education.            ASSESSMENT/PLAN:    ICD-10-CM   1. CME (cystoid macular edema), right  H35.351     2. Moderate nonproliferative diabetic retinopathy of both eyes with macular edema associated with type 2 diabetes mellitus (HCC)  E11.3313 OCT, Retina - OU - Both Eyes    Intravitreal Injection, Pharmacologic Agent - OS - Left Eye    Bevacizumab (AVASTIN) SOLN 1.25 mg    3. Essential hypertension  I10     4. Hypertensive retinopathy of both eyes  H35.033     5. 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 Pseudophakia  Z96.1      1. CME OD  - pt lost to f/u for 6 mos rather  than 4-6 wks  - OCT shows OD: Interval improvement in foveal contour and central IRF, partial PVD  - BCVA OD stable at 20/50  - FA (1.4.23) shows petaloid leakage consistent with CME OD + mild leaking MA OU  - restart PF  and Prolensa QID OU for CME component -- pt has been out of drops for months  - f/u 4 wks for DFE, OCT  2. Moderate nonproliferative diabetic retinopathy OU  - mild cystic changes/ DME OU - exam with scattered MA OU - FA (1.4.23) shows mild late leaking MA OU - OCT OD: Interval improvement in foveal contour and central IRF, partial PVD; OS: Interval increase in IRF temporal fovea and macula -- significantly worse DME  - recommend IVA OS #1 today, 09.22.23 for worsening DME  - pt wishes to proceed with injection  - RBA of procedure discussed, questions answered - IVA informed consent obtained and signed, 09.22.23 (OS) - see procedure note - f/u in 4 wks -- DFE/OCT  3,4. Hypertensive retinopathy OU - discussed importance of tight BP control - monitor  5. Mixed Cataract OU - The symptoms of cataract, surgical options, and treatments and risks were discussed with patient. - discussed diagnosis and progression. - under the expert management of Dr. Shirleen Schirmer - recommend holding off on OD until work up is completed and treatment plan is initiated  6. Pseudophakia OS  - s/p CE/IOL (Dr. Shirleen Schirmer)  - IOL in good position, doing well  - monitor   Ophthalmic Meds Ordered this visit:  Meds ordered this encounter  Medications   Bromfenac Sodium (PROLENSA) 0.07 % SOLN    Sig: Place 1 drop into both eyes 4 (four) times daily.    Dispense:  6 mL    Refill:  5   prednisoLONE acetate (PRED FORTE) 1 % ophthalmic suspension    Sig: Place 1 drop into both eyes 4 (four) times daily.    Dispense:  15 mL    Refill:  0   Bevacizumab (AVASTIN) SOLN 1.25 mg     Return in about 4 weeks (around 09/11/2022) for f/u CME OD, DFE, OCT.  There are no Patient Instructions on file for this visit.   Explained the diagnoses, plan, and follow up with the patient and they expressed understanding.  Patient expressed understanding of the importance of proper follow up care.   This document serves as a record  of services personally performed by Gardiner Sleeper, MD, PhD. It was created on their behalf by San Jetty. Owens Shark, OA an ophthalmic technician. The creation of this record is the provider's dictation and/or activities during the visit.    Electronically signed by: San Jetty. Marguerita Merles 09.15.2023 3:48 PM   Gardiner Sleeper, M.D., Ph.D. Diseases & Surgery of the Retina and Vitreous [facility]  I have reviewed the above documentation for accuracy and completeness, and I agree with the above. Gardiner Sleeper, M.D., Ph.D. 08/14/22 3:58 PM  Abbreviations: M myopia (nearsighted); A astigmatism; H hyperopia (farsighted); P presbyopia; Mrx spectacle prescription;  CTL contact lenses; OD right eye; OS left eye; OU both eyes  XT exotropia; ET esotropia; PEK punctate epithelial keratitis; PEE punctate epithelial erosions; DES dry eye syndrome; MGD meibomian gland dysfunction; ATs artificial tears; PFAT's preservative free artificial tears; [place] nuclear sclerotic cataract; PSC posterior subcapsular cataract; ERM epi-retinal membrane; PVD posterior vitreous detachment; RD retinal detachment; DM diabetes mellitus; DR diabetic retinopathy; NPDR non-proliferative diabetic retinopathy; PDR proliferative  diabetic retinopathy; CSME clinically significant macular edema; DME diabetic macular edema; dbh dot blot hemorrhages; CWS cotton wool spot; POAG primary open angle glaucoma; C/D cup-to-disc ratio; HVF humphrey visual field; GVF goldmann visual field; OCT optical coherence tomography; IOP intraocular pressure; BRVO Branch retinal vein occlusion; CRVO central retinal vein occlusion; CRAO central retinal artery occlusion; BRAO branch retinal artery occlusion; RT retinal tear; SB scleral buckle; PPV pars plana vitrectomy; VH Vitreous hemorrhage; PRP panretinal laser photocoagulation; IVK intravitreal kenalog; VMT vitreomacular traction; MH Macular hole;  NVD neovascularization of the disc; NVE  neovascularization elsewhere; AREDS age related eye disease study; ARMD age related macular degeneration; POAG primary open angle glaucoma; EBMD epithelial/anterior basement membrane dystrophy; ACIOL anterior chamber intraocular lens; IOL intraocular lens; PCIOL posterior chamber intraocular lens; Phaco/IOL phacoemulsification with intraocular lens placement; [place] photorefractive keratectomy; LASIK laser assisted in situ keratomileusis; HTN hypertension; DM diabetes mellitus; COPD chronic obstructive pulmonary disease

## 2022-08-14 ENCOUNTER — Ambulatory Visit (INDEPENDENT_AMBULATORY_CARE_PROVIDER_SITE_OTHER): Payer: Medicare (Managed Care) | Admitting: Ophthalmology

## 2022-08-14 ENCOUNTER — Encounter (INDEPENDENT_AMBULATORY_CARE_PROVIDER_SITE_OTHER): Payer: Self-pay | Admitting: Ophthalmology

## 2022-08-14 DIAGNOSIS — H35033 Hypertensive retinopathy, bilateral: Secondary | ICD-10-CM

## 2022-08-14 DIAGNOSIS — I10 Essential (primary) hypertension: Secondary | ICD-10-CM | POA: Diagnosis not present

## 2022-08-14 DIAGNOSIS — E113313 Type 2 diabetes mellitus with moderate nonproliferative diabetic retinopathy with macular edema, bilateral: Secondary | ICD-10-CM | POA: Diagnosis not present

## 2022-08-14 DIAGNOSIS — H25813 Combined forms of age-related cataract, bilateral: Secondary | ICD-10-CM

## 2022-08-14 DIAGNOSIS — H35351 Cystoid macular degeneration, right eye: Secondary | ICD-10-CM

## 2022-08-14 DIAGNOSIS — H25811 Combined forms of age-related cataract, right eye: Secondary | ICD-10-CM

## 2022-08-14 DIAGNOSIS — Z961 Presence of intraocular lens: Secondary | ICD-10-CM

## 2022-08-14 MED ORDER — BEVACIZUMAB CHEMO INJECTION 1.25MG/0.05ML SYRINGE FOR KALEIDOSCOPE
1.2500 mg | INTRAVITREAL | Status: AC | PRN
  Administered 2022-08-14: 1.25 mg via INTRAVITREAL

## 2022-08-14 MED ORDER — PREDNISOLONE ACETATE 1 % OP SUSP: 1.0000 [drp] | Freq: Four times a day (QID) | OPHTHALMIC | 0 refills | Status: AC

## 2022-08-14 MED ORDER — PROLENSA 0.07 % OP SOLN: 1.0000 [drp] | Freq: Four times a day (QID) | OPHTHALMIC | 5 refills | Status: AC

## 2022-09-11 ENCOUNTER — Encounter (HOSPITAL_COMMUNITY): Payer: Self-pay | Admitting: Emergency Medicine

## 2022-09-11 ENCOUNTER — Emergency Department (HOSPITAL_COMMUNITY): Payer: Medicare (Managed Care)

## 2022-09-11 ENCOUNTER — Other Ambulatory Visit: Payer: Self-pay

## 2022-09-11 ENCOUNTER — Emergency Department (HOSPITAL_COMMUNITY)
Admission: EM | Admit: 2022-09-11 | Discharge: 2022-09-12 | Disposition: A | Discharge status: Home / Self Care | Payer: Medicare (Managed Care) | Attending: Emergency Medicine | Admitting: Emergency Medicine

## 2022-09-11 ENCOUNTER — Encounter (INDEPENDENT_AMBULATORY_CARE_PROVIDER_SITE_OTHER): Payer: Medicare (Managed Care) | Admitting: Ophthalmology

## 2022-09-11 DIAGNOSIS — E113313 Type 2 diabetes mellitus with moderate nonproliferative diabetic retinopathy with macular edema, bilateral: Secondary | ICD-10-CM

## 2022-09-11 DIAGNOSIS — I10 Essential (primary) hypertension: Secondary | ICD-10-CM

## 2022-09-11 DIAGNOSIS — Z7902 Long term (current) use of antithrombotics/antiplatelets: Secondary | ICD-10-CM | POA: Diagnosis not present

## 2022-09-11 DIAGNOSIS — H35351 Cystoid macular degeneration, right eye: Secondary | ICD-10-CM

## 2022-09-11 DIAGNOSIS — R059 Cough, unspecified: Secondary | ICD-10-CM | POA: Diagnosis present

## 2022-09-11 DIAGNOSIS — J028 Acute pharyngitis due to other specified organisms: Secondary | ICD-10-CM | POA: Diagnosis not present

## 2022-09-11 DIAGNOSIS — B9789 Other viral agents as the cause of diseases classified elsewhere: Secondary | ICD-10-CM | POA: Insufficient documentation

## 2022-09-11 DIAGNOSIS — H35033 Hypertensive retinopathy, bilateral: Secondary | ICD-10-CM

## 2022-09-11 DIAGNOSIS — U071 COVID-19: Secondary | ICD-10-CM | POA: Insufficient documentation

## 2022-09-11 DIAGNOSIS — H25811 Combined forms of age-related cataract, right eye: Secondary | ICD-10-CM

## 2022-09-11 DIAGNOSIS — J029 Acute pharyngitis, unspecified: Secondary | ICD-10-CM

## 2022-09-11 DIAGNOSIS — Z961 Presence of intraocular lens: Secondary | ICD-10-CM

## 2022-09-11 DIAGNOSIS — R051 Acute cough: Secondary | ICD-10-CM

## 2022-09-11 LAB — COMPREHENSIVE METABOLIC PANEL WITH GFR
ALT: 27 U/L (ref 0–44)
AST: 21 U/L (ref 15–41)
Albumin: 3.8 g/dL (ref 3.5–5.0)
Alkaline Phosphatase: 77 U/L (ref 38–126)
Anion gap: 12 (ref 5–15)
BUN: 6 mg/dL — ABNORMAL LOW (ref 8–23)
CO2: 26 mmol/L (ref 22–32)
Calcium: 9.2 mg/dL (ref 8.9–10.3)
Chloride: 104 mmol/L (ref 98–111)
Creatinine, Ser: 0.64 mg/dL (ref 0.44–1.00)
GFR, Estimated: >60 mL/min
Glucose, Bld: 148 mg/dL — ABNORMAL HIGH (ref 70–99)
Potassium: 3.7 mmol/L (ref 3.5–5.1)
Sodium: 142 mmol/L (ref 135–145)
Total Bilirubin: 0.4 mg/dL (ref 0.3–1.2)
Total Protein: 7.1 g/dL (ref 6.5–8.1)

## 2022-09-11 LAB — CBC WITH DIFFERENTIAL/PLATELET
Abs Immature Granulocytes: 0.02 10*3/uL (ref 0.00–0.07)
Basophils Absolute: 0 10*3/uL (ref 0.0–0.1)
Basophils Relative: 1 %
Eosinophils Absolute: 0 10*3/uL (ref 0.0–0.5)
Eosinophils Relative: 1 %
HCT: 38.6 % (ref 36.0–46.0)
Hemoglobin: 13 g/dL (ref 12.0–15.0)
Immature Granulocytes: 0 %
Lymphocytes Relative: 20 %
Lymphs Abs: 1.2 10*3/uL (ref 0.7–4.0)
MCH: 31.9 pg (ref 26.0–34.0)
MCHC: 33.7 g/dL (ref 30.0–36.0)
MCV: 94.8 fL (ref 80.0–100.0)
Monocytes Absolute: 0.6 10*3/uL (ref 0.1–1.0)
Monocytes Relative: 9 %
Neutro Abs: 4.3 10*3/uL (ref 1.7–7.7)
Neutrophils Relative %: 69 %
Platelets: 236 10*3/uL (ref 150–400)
RBC: 4.07 MIL/uL (ref 3.87–5.11)
RDW: 12.4 % (ref 11.5–15.5)
WBC: 6.1 10*3/uL (ref 4.0–10.5)
nRBC: 0 % (ref 0.0–0.2)

## 2022-09-11 LAB — CBG MONITORING, ED: Glucose-Capillary: 210 mg/dL — ABNORMAL HIGH (ref 70–99)

## 2022-09-11 LAB — RESP PANEL BY RT-PCR (FLU A&B, COVID) ARPGX2
Influenza A by PCR: NEGATIVE
Influenza B by PCR: NEGATIVE
SARS Coronavirus 2 by RT PCR: POSITIVE — AB

## 2022-09-11 NOTE — ED Triage Notes (Signed)
Per [facility] pt coming from home- originally called out for shortness of breath but on arrival patient had used home inhaler and improved symptoms. Cough, sore throat and fever x 3 days. Reports recent covid exposure. Lung sounds clear. Left sided deficits from previous CVAs. Wheelchair bound at baseline.

## 2022-09-11 NOTE — ED Provider Triage Note (Signed)
Emergency Medicine Provider Triage Evaluation Note  Christina Evans , a 61 y.o. female  was evaluated in triage.  Pt complains of cough, sore throat, nasal congestion, shortness of breath, fever.  Patient states that symptoms been present for the past 3 to 4 days.  Patient reports possible recent COVID exposure.  She reports being able to swallow food/liquids but with pain.  Reports no difficulty breathing.  Reports no chest pain, abdominal pain, nausea, vomiting.  Review of Systems  Positive: See abov Negative:   Physical Exam  BP 130/70   Pulse (!) 101   Temp 98 F (36.7 C) (Oral)   Resp 14   Ht 5\' 1"  (1.549 m)   Wt 61.2 kg   SpO2 98%   BMI 25.51 kg/m  Gen:   Awake, no distress   Resp:  Normal effort  MSK:   Moves extremities without difficulty  Other:  Lungs clear to auscultation bilaterally.  No abdominal tenderness.  Medical Decision Making  Medically screening exam initiated at 9:37 AM.  Appropriate orders placed.  Christina Evans was informed that the remainder of the evaluation will be completed by another provider, this initial triage assessment does not replace that evaluation, and the importance of remaining in the ED until their evaluation is complete.     Wilnette Kales, [place] 09/11/22 [phone]

## 2022-09-12 NOTE — Discharge Instructions (Addendum)
You were seen in the ER for symptoms of COVID.  Symptoms can last 1 to 2 weeks, usually with the cough being the last symptom to resolve.  You may use honey, warm liquids, sugar-free cough drops to help with sore throat and cough.  Avoid over-the-counter cough medicines as they can raise your blood pressure.  You may also use Tylenol for fevers and body aches.  I also recommend wearing a mask when you are around others including family members to prevent the spread of COVID-19.  I recommend following up with your PCP if your symptoms do not improve.  Return to the ER if you develop new or worsening symptoms including shortness of breath that does not improve with your inhaler.

## 2022-09-12 NOTE — ED Notes (Signed)
Daughter called in and states that patient has not made it back home yet and they are worried as she has dementia. Had check in call patient in the lobby, noone replied. If patient is found daughter is [phone], daughters name is also Katharine Look

## 2022-09-12 NOTE — ED Provider Notes (Signed)
[facility] EMERGENCY DEPARTMENT Provider Note   CSN: [id number] Arrival date & time: 09/11/22  0908     History  Chief Complaint  Patient presents with   Cough   Sore Throat    Covid+    Christina Evans is a 62 y.o. female presents to the ER coming from home via EMS complaining of shortness of breath.  Upon EMS arrival patient had used her inhaler and had improved symptoms.  She does report cough, sore throat, fever x3 days.  She also says she was recently exposed to [place].  Denies nausea, vomiting, diarrhea, headache, dizziness, lightheadedness.  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  She is wheelchair-bound at baseline.  Her daughter also gave her OTC cough medicine and tea with honey.   Cough Associated symptoms: fever, rhinorrhea and shortness of breath   Associated symptoms: no chest pain, no chills and no wheezing   Sore Throat Associated symptoms include shortness of breath. Pertinent negatives include no chest pain.       Home Medications Prior to Admission medications   Medication Sig Start Date End Date Taking? Authorizing Provider  acetaminophen (TYLENOL) 500 MG tablet Take 500 mg by mouth every 6 (six) hours as needed for moderate pain or mild pain.    [provider]  albuterol (PROAIR HFA) 108 (90 Base) MCG/ACT inhaler Inhale 2 puffs into the lungs 4 (four) times daily as needed for wheezing or shortness of breath. 10/06/19   Hennie Duos, MD  atorvastatin (LIPITOR) 80 MG tablet TAKE 1 TABLET BY MOUTH ONCE DAILY. Patient taking differently: Take 40 mg by mouth at bedtime. 02/21/21 02/21/22  Clovia Cuff, MD  blood glucose meter kit and supplies Dispense based on patient and insurance preference. Use up to four times daily as directed. (FOR ICD-9 250.00, 250.01). 09/02/17   Cheryln Manly, NP  Bromfenac Sodium (PROLENSA) 0.07 % SOLN Place 1 drop into both eyes 4 (four) times daily. 08/14/22   Bernarda Caffey, MD   Cholecalciferol (VITAMIN D3) 50 MCG (2000 UT) TABS Take 2,000 Units by mouth daily.    [provider]  citalopram (CELEXA) 40 MG tablet TAKE 1 TABLET BY MOUTH ONCE DAILY. Patient taking differently: Take 20 mg by mouth daily. 11/25/20 02/03/22  Clovia Cuff, MD  clopidogrel (PLAVIX) 75 MG tablet Take 1 tablet (75 mg total) by mouth daily. 10/06/19   Hennie Duos, MD  glucose blood test strip Use as instructed 10/15/19   Kerin Perna, NP  HYDROcodone-acetaminophen (NORCO/VICODIN) 5-325 MG tablet Take 0.5 tablets by mouth every 6 (six) hours as needed for moderate pain.    [provider]  insulin lispro (HUMALOG) 100 UNIT/ML injection Inject 0.15 mLs (15 Units total) into the skin 3 (three) times daily before meals. Patient taking differently: Inject 5 Units into the skin 3 (three) times daily with meals as needed for high blood sugar. 10/20/19   Charlesetta Shanks, MD  Lancets (ACCU-CHEK SOFT TOUCH) lancets Use as instructed 10/15/19   Kerin Perna, NP  melatonin 5 MG TABS Take 5 mg by mouth at bedtime.    [provider]  metFORMIN (GLUCOPHAGE-XR) 500 MG 24 hr tablet Take 2 tablets (1,000 mg total) by mouth 2 (two) times daily. 10/06/19   Hennie Duos, MD  prednisoLONE acetate (PRED FORTE) 1 % ophthalmic suspension Place 1 drop into both eyes 4 (four) times daily. 08/14/22   Bernarda Caffey, MD  tamsulosin (FLOMAX) 0.4 MG CAPS capsule  Take 0.4 mg by mouth daily.    [provider]  trimethoprim (TRIMPEX) 100 MG tablet Take 100 mg by mouth daily.    [provider]      Allergies    Tape    Review of Systems   Review of Systems  Constitutional:  Positive for fever. Negative for chills.  HENT:  Positive for congestion and rhinorrhea.   Respiratory:  Positive for cough and shortness of breath. Negative for wheezing.   Cardiovascular:  Negative for chest pain and palpitations.  Gastrointestinal:  Negative for diarrhea, nausea and  vomiting.    Physical Exam Updated Vital Signs BP (!) 167/76 (BP Location: Right Arm)   Pulse 95   Temp 98.2 F (36.8 C) (Oral)   Resp 17   Ht 5' 1"  (1.549 m)   Wt 61.2 kg   SpO2 98%   BMI 25.51 kg/m  Physical Exam Vitals and nursing note reviewed.  Constitutional:      General: She is not in acute distress.    Appearance: Normal appearance. She is not ill-appearing or diaphoretic.  HENT:     Nose: Congestion and rhinorrhea present.     Mouth/Throat:     Mouth: Mucous membranes are moist.     Pharynx: Oropharynx is clear. Uvula midline. No pharyngeal swelling, oropharyngeal exudate or posterior oropharyngeal erythema.  Cardiovascular:     Rate and Rhythm: Normal rate and regular rhythm.     Pulses: Normal pulses.     Heart sounds: Normal heart sounds.  Pulmonary:     Effort: Pulmonary effort is normal. No tachypnea or accessory muscle usage.     Breath sounds: Normal breath sounds and air entry. No decreased air movement.  Abdominal:     Palpations: Abdomen is soft.     Tenderness: There is no abdominal tenderness.  Skin:    General: Skin is warm and dry.     Capillary Refill: Capillary refill takes less than 2 seconds.  Neurological:     Mental Status: She is alert. Mental status is at baseline.  Psychiatric:        Mood and Affect: Mood normal.        Behavior: Behavior normal.     ED Results / Procedures / Treatments   Labs (all labs ordered are listed, but only abnormal results are displayed) Labs Reviewed  RESP PANEL BY RT-PCR (FLU A&B, COVID) ARPGX2 - Abnormal; Notable for the following components:      Result Value   SARS Coronavirus 2 by RT PCR POSITIVE (*)    All other components within normal limits  COMPREHENSIVE METABOLIC PANEL - Abnormal; Notable for the following components:   Glucose, Bld 148 (*)    BUN 6 (*)    All other components within normal limits  CBG MONITORING, ED - Abnormal; Notable for the following components:   Glucose-Capillary  210 (*)    All other components within normal limits  CBC WITH DIFFERENTIAL/PLATELET    EKG None  Radiology DG Chest 2 View  Result Date: 09/11/2022 CLINICAL DATA:  Cough EXAM: CHEST - 2 VIEW COMPARISON:  10/20/2019 FINDINGS: The heart size and mediastinal contours are within normal limits. Slightly low lung volumes. No focal airspace consolidation, pleural effusion, or pneumothorax. The visualized skeletal structures are unremarkable. IMPRESSION: No active cardiopulmonary disease. Electronically Signed   By: Davina Poke D.O.   On: 09/11/2022 10:15    Procedures Procedures    Medications Ordered in ED Medications - No data  to display  ED Course/ Medical Decision Making/ A&P                           Medical Decision Making Amount and/or Complexity of Data Reviewed Independent Historian: parent Labs: ordered.   Patient presents to the ER with concerns of shortness of breath, cough, sore throat, and fever.  She has recent exposure to [place].  She has been experiencing the symptoms approximately 3 days.  She called EMS today for shortness of breath, improved with use of inhaler.  Patient is positive for COVID.  Negative for influenza.  Laboratory work-up demonstrates no leukocytosis, anemia, metabolic disturbance aside from elevated glucose.  Patient has history of diabetes.  Physical exam is reassuring, lung fields clear to auscultation.  Patient has dry cough.  She is able to speak and swallow without difficulty.  Chest x-ray shows no active cardiopulmonary disease process.    Plan to discharge patient home with instructions on home management of symptoms of COVID-19.    Educated patient on use of OTC cough medicines and hypertension.  Advised patient to avoid using these medications.  Educated patient that warm liquids, honey, sugar-free cough drops are good alternatives to help with sore throat and cough.  Advised patient to use inhaler if she develops shortness of breath and  wheezing.  Strict return precautions were given.    Document critical care time when appropriate:1}      Final Clinical Impression(s) / ED Diagnoses Final diagnoses:  COVID-19  Acute cough  Viral pharyngitis    Rx / DC Orders ED Discharge Orders     None         Pat Kocher, PA 09/12/22 [place], [place], DO 09/12/22 [id number]

## 2022-09-15 NOTE — Progress Notes (Shared)
[facility] Note  09/23/2022     CHIEF COMPLAINT Patient presents for No chief complaint on file.    HISTORY OF PRESENT ILLNESS: Christina Evans is a 61 y.o. female who presents to the clinic today for:      Referring physician: [facility] [address] [place],  [place] [id number]  HISTORICAL INFORMATION:   Selected notes from the [facility] Referred by Dr. Midge Aver for concern of cataract clearance / NPDR LEE:  Ocular Hx- PMH-    CURRENT MEDICATIONS: Current Outpatient Medications (Ophthalmic Drugs)  Medication Sig   Bromfenac Sodium (PROLENSA) 0.07 % SOLN Place 1 drop into both eyes 4 (four) times daily.   prednisoLONE acetate (PRED FORTE) 1 % ophthalmic suspension Place 1 drop into both eyes 4 (four) times daily.   No current facility-administered medications for this visit. (Ophthalmic Drugs)   Current Outpatient Medications (Other)  Medication Sig   acetaminophen (TYLENOL) 500 MG tablet Take 500 mg by mouth every 6 (six) hours as needed for moderate pain or mild pain.   albuterol (PROAIR HFA) 108 (90 Base) MCG/ACT inhaler Inhale 2 puffs into the lungs 4 (four) times daily as needed for wheezing or shortness of breath.   atorvastatin (LIPITOR) 80 MG tablet TAKE 1 TABLET BY MOUTH ONCE DAILY. (Patient taking differently: Take 40 mg by mouth at bedtime.)   blood glucose meter kit and supplies Dispense based on patient and insurance preference. Use up to four times daily as directed. (FOR ICD-9 250.00, 250.01).   Cholecalciferol (VITAMIN D3) 50 MCG (2000 UT) TABS Take 2,000 Units by mouth daily.   citalopram (CELEXA) 40 MG tablet TAKE 1 TABLET BY MOUTH ONCE DAILY. (Patient taking differently: Take 20 mg by mouth daily.)   clopidogrel (PLAVIX) 75 MG tablet Take 1 tablet (75 mg total) by mouth daily.   glucose blood test strip Use as instructed   HYDROcodone-acetaminophen (NORCO/VICODIN) 5-325 MG  tablet Take 0.5 tablets by mouth every 6 (six) hours as needed for moderate pain.   insulin lispro (HUMALOG) 100 UNIT/ML injection Inject 0.15 mLs (15 Units total) into the skin 3 (three) times daily before meals. (Patient taking differently: Inject 5 Units into the skin 3 (three) times daily with meals as needed for high blood sugar.)   Lancets (ACCU-CHEK SOFT TOUCH) lancets Use as instructed   melatonin 5 MG TABS Take 5 mg by mouth at bedtime.   metFORMIN (GLUCOPHAGE-XR) 500 MG 24 hr tablet Take 2 tablets (1,000 mg total) by mouth 2 (two) times daily.   tamsulosin (FLOMAX) 0.4 MG CAPS capsule Take 0.4 mg by mouth daily.   trimethoprim (TRIMPEX) 100 MG tablet Take 100 mg by mouth daily.   No current facility-administered medications for this visit. (Other)   REVIEW OF SYSTEMS:   ALLERGIES Allergies  Allergen Reactions   Tape Itching and Other (See Comments)    Causes a lot of scratching, also   PAST MEDICAL HISTORY Past Medical History:  Diagnosis Date   Asthma    CVA (cerebral vascular accident) ([place])    2008 and 2020; left spastic hemiplegia   Dementia ([place])    Diabetes mellitus without complication ([place])    Past Surgical History:  Procedure Laterality Date   IR ANGIO INTRA EXTRACRAN SEL COM CAROTID INNOMINATE BILAT MOD SED  09/15/2019   IR ANGIO VERTEBRAL SEL VERTEBRAL UNI L MOD SED  09/15/2019   ORIF ANKLE FRACTURE Left 02/03/2022  Procedure: OPEN TREATMENT OF LEFT BIMALLEOLAR ANKLE FRACTURE;  Surgeon: Erle Crocker, MD;  Location: [facility];  Service: Orthopedics;  Laterality: Left;  LENGTH OF SURGERY: 90 MINUTES POSITION: SUPINE   SYNDESMOSIS REPAIR Left 02/03/2022   Procedure: SYNDESMOSIS;  Surgeon: Erle Crocker, MD;  Location: [facility];  Service: Orthopedics;  Laterality: Left;  LENGTH OF SURGERY: 90 MINUTES POSITION: SUPINE   FAMILY HISTORY Family History  Problem Relation Age of Onset   Hypertension Mother    Hypertension Father    SOCIAL HISTORY Social  History   Tobacco Use   Smoking status: Former   Smokeless tobacco: Never   Tobacco comments:    QUIT IN 2008  Vaping Use   Vaping Use: Never used  Substance Use Topics   Alcohol use: Yes    Comment: RARE   Drug use: No       OPHTHALMIC EXAM:  Not recorded     IMAGING AND PROCEDURES  Imaging and Procedures for 09/23/2022          ASSESSMENT/PLAN:  No diagnosis found.  1. CME OD  - pt lost to f/u for 6 mos rather than 4-6 wks  - OCT shows OD: Interval improvement in foveal contour and central IRF, partial PVD  - BCVA OD stable at 20/50  - FA (1.4.23) shows petaloid leakage consistent with CME OD + mild leaking MA OU  - restart PF and Prolensa QID OU for CME component -- pt has been out of drops for months  - f/u 4 wks for DFE, OCT  2. Moderate nonproliferative diabetic retinopathy OU  - mild cystic changes/ DME OU - exam with scattered MA OU - FA (1.4.23) shows mild late leaking MA OU - OCT OD: Interval improvement in foveal contour and central IRF, partial PVD; OS: Interval increase in IRF temporal fovea and macula -- significantly worse DME - s/p IVA OS #1 (09.22.23)  - recommend IVA OS #2 today, 11.01.23 for worsening DME  - pt wishes to proceed with injection  - RBA of procedure discussed, questions answered - IVA informed consent obtained and signed, 09.22.23 (OS) - see procedure note - f/u in 4 wks -- DFE/OCT  3,4. Hypertensive retinopathy OU - discussed importance of tight BP control - monitor  5. Mixed Cataract OU - The symptoms of cataract, surgical options, and treatments and risks were discussed with patient. - discussed diagnosis and progression. - under the expert management of Dr. Shirleen Schirmer - recommend holding off on OD until work up is completed and treatment plan is initiated  6. Pseudophakia OS  - s/p CE/IOL (Dr. Shirleen Schirmer)  - IOL in good position, doing well  - monitor   Ophthalmic Meds Ordered this visit:  No orders of the defined  types were placed in this encounter.    No follow-ups on file.  There are no Patient Instructions on file for this visit.   Explained the diagnoses, plan, and follow up with the patient and they expressed understanding.  Patient expressed understanding of the importance of proper follow up care.   This document serves as a record of services personally performed by Gardiner Sleeper, MD, PhD. It was created on their behalf by Orvan Falconer, an ophthalmic technician. The creation of this record is the provider's dictation and/or activities during the visit.    Electronically signed by: Orvan Falconer, OA, 09/15/22  10:09 AM    Gardiner Sleeper, M.D., Ph.D. Diseases & Surgery of the Retina and  Vitreous [facility]   Abbreviations: M myopia (nearsighted); A astigmatism; H hyperopia (farsighted); P presbyopia; Mrx spectacle prescription;  CTL contact lenses; OD right eye; OS left eye; OU both eyes  XT exotropia; ET esotropia; PEK punctate epithelial keratitis; PEE punctate epithelial erosions; DES dry eye syndrome; MGD meibomian gland dysfunction; ATs artificial tears; PFAT's preservative free artificial tears; [place] nuclear sclerotic cataract; PSC posterior subcapsular cataract; ERM epi-retinal membrane; PVD posterior vitreous detachment; RD retinal detachment; DM diabetes mellitus; DR diabetic retinopathy; NPDR non-proliferative diabetic retinopathy; PDR proliferative diabetic retinopathy; CSME clinically significant macular edema; DME diabetic macular edema; dbh dot blot hemorrhages; CWS cotton wool spot; POAG primary open angle glaucoma; C/D cup-to-disc ratio; HVF humphrey visual field; GVF goldmann visual field; OCT optical coherence tomography; IOP intraocular pressure; BRVO Branch retinal vein occlusion; CRVO central retinal vein occlusion; CRAO central retinal artery occlusion; BRAO branch retinal artery occlusion; RT retinal tear; SB scleral buckle; PPV pars plana  vitrectomy; VH Vitreous hemorrhage; PRP panretinal laser photocoagulation; IVK intravitreal kenalog; VMT vitreomacular traction; MH Macular hole;  NVD neovascularization of the disc; NVE neovascularization elsewhere; AREDS age related eye disease study; ARMD age related macular degeneration; POAG primary open angle glaucoma; EBMD epithelial/anterior basement membrane dystrophy; ACIOL anterior chamber intraocular lens; IOL intraocular lens; PCIOL posterior chamber intraocular lens; Phaco/IOL phacoemulsification with intraocular lens placement; [place] photorefractive keratectomy; LASIK laser assisted in situ keratomileusis; HTN hypertension; DM diabetes mellitus; COPD chronic obstructive pulmonary disease

## 2022-09-23 ENCOUNTER — Encounter (INDEPENDENT_AMBULATORY_CARE_PROVIDER_SITE_OTHER): Payer: Medicare (Managed Care) | Admitting: Ophthalmology

## 2022-09-23 DIAGNOSIS — I10 Essential (primary) hypertension: Secondary | ICD-10-CM

## 2022-09-23 DIAGNOSIS — H25811 Combined forms of age-related cataract, right eye: Secondary | ICD-10-CM

## 2022-09-23 DIAGNOSIS — H25813 Combined forms of age-related cataract, bilateral: Secondary | ICD-10-CM

## 2022-09-23 DIAGNOSIS — H35351 Cystoid macular degeneration, right eye: Secondary | ICD-10-CM

## 2022-09-23 DIAGNOSIS — E113313 Type 2 diabetes mellitus with moderate nonproliferative diabetic retinopathy with macular edema, bilateral: Secondary | ICD-10-CM

## 2022-09-23 DIAGNOSIS — E113311 Type 2 diabetes mellitus with moderate nonproliferative diabetic retinopathy with macular edema, right eye: Secondary | ICD-10-CM

## 2022-09-23 DIAGNOSIS — Z961 Presence of intraocular lens: Secondary | ICD-10-CM

## 2022-09-23 DIAGNOSIS — H35033 Hypertensive retinopathy, bilateral: Secondary | ICD-10-CM

## 2023-02-09 ENCOUNTER — Ambulatory Visit: Payer: Medicare (Managed Care)

## 2023-02-09 ENCOUNTER — Ambulatory Visit: Payer: Medicare Other

## 2023-09-04 ENCOUNTER — Ambulatory Visit (HOSPITAL_COMMUNITY)
Admission: EM | Admit: 2023-09-04 | Discharge: 2023-09-04 | Disposition: A | Discharge status: Home / Self Care | Payer: Medicare (Managed Care) | Attending: Family Medicine | Admitting: Family Medicine

## 2023-09-04 ENCOUNTER — Encounter (HOSPITAL_COMMUNITY): Payer: Self-pay

## 2023-09-04 DIAGNOSIS — J069 Acute upper respiratory infection, unspecified: Secondary | ICD-10-CM | POA: Diagnosis not present

## 2023-09-04 DIAGNOSIS — J4521 Mild intermittent asthma with (acute) exacerbation: Secondary | ICD-10-CM | POA: Diagnosis not present

## 2023-09-04 MED ORDER — BENZONATATE 100 MG PO CAPS: 100.0000 mg | ORAL_CAPSULE | Freq: Three times a day (TID) | ORAL | 0 refills | Status: AC | PRN

## 2023-09-04 MED ORDER — PREDNISONE 20 MG PO TABS: 40.0000 mg | ORAL_TABLET | Freq: Every day | ORAL | 0 refills | Status: AC

## 2023-09-04 NOTE — ED Provider Notes (Signed)
[facility]    CSN: [id number] Arrival date & time: 09/04/23  1033      History   Chief Complaint Chief Complaint  Patient presents with   Cough    HPI Christina Evans is a 62 y.o. female.    Cough Here for cough and congestion and wheezing.  Symptoms began 4 days ago.  No fever and no vomiting or diarrhea.  She has been exposed to her 2 granddaughters who have had similar symptoms.    This patient does have a history of asthma.  She does have an inhaler at home to use as needed  Then her daughter mentions that she thinks her lower lip looks a little puffy, but then she states that maybe it is because her left facial droop is a little more prominent today.  She states that since her stroke when she does not feel good sometimes the droop will be more noticeable.  No itching or tingling of the lip.  No swelling of the tongue noted.  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/2019   Metatarsal fracture 09/22/2019   Depression 09/22/2019   Asthma 09/22/2019   Palliative care by specialist    Goals of care, counseling/discussion    Encephalopathy    Acute CVA (cerebrovascular accident) (HCC) 09/14/2019   Acute arterial ischemic stroke, multifocal, anterior circulation, right (HCC) 09/14/2019   Severe comorbid illness    AMS (altered mental status) 09/13/2019   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   Hyperlipidemia  09/02/2017   Hypertension 09/02/2017   Chest pain 09/02/2017    Past Surgical History:  Procedure Laterality Date   IR ANGIO INTRA EXTRACRAN SEL COM CAROTID INNOMINATE BILAT MOD SED  09/15/2019   IR ANGIO VERTEBRAL SEL VERTEBRAL UNI L MOD SED  09/15/2019   ORIF ANKLE FRACTURE Left 02/03/2022   Procedure: OPEN TREATMENT OF LEFT BIMALLEOLAR ANKLE FRACTURE;  Surgeon: Terance Hart, MD;  Location: [facility] OR;  Service: Orthopedics;  Laterality: Left;  LENGTH OF SURGERY: 90 MINUTES POSITION: SUPINE   SYNDESMOSIS REPAIR Left 02/03/2022   Procedure: SYNDESMOSIS;  Surgeon: Terance Hart, MD;  Location: [facility] OR;  Service: Orthopedics;  Laterality: Left;  LENGTH OF SURGERY: 90 MINUTES POSITION: SUPINE    OB History   No obstetric history on file.      Home Medications    Prior to Admission medications   Medication Sig Start Date End Date Taking? Authorizing Provider  benzonatate (TESSALON) 100 MG capsule Take 1 capsule (100 mg total) by mouth 3 (three) times daily as needed for cough. 09/04/23  Yes Zenia Resides, MD  diclofenac Sodium (VOLTAREN) 1 % GEL Apply 2 g topically. 06/29/23  Yes [provider]  predniSONE (DELTASONE) 20 MG tablet Take 2 tablets (40 mg total) by mouth daily with breakfast for 5 days. 09/04/23 09/09/23 Yes Shuntel Fishburn, Janace Aris, MD  acetaminophen (TYLENOL) 500 MG tablet Take 500 mg by mouth every 6 (  six) hours as needed for moderate pain or mild pain.    [provider]  albuterol (PROAIR HFA) 108 (90 Base) MCG/ACT inhaler Inhale 2 puffs into the lungs 4 (four) times daily as needed for wheezing or shortness of breath. 10/06/19   Margit Hanks, MD  atorvastatin (LIPITOR) 80 MG tablet TAKE 1 TABLET BY MOUTH ONCE DAILY. Patient taking differently: Take 40 mg by mouth at bedtime. 02/21/21 02/21/22  Annita Brod, MD  blood glucose meter kit and supplies Dispense based on patient and insurance preference. Use up to four times daily as directed.  (FOR ICD-9 250.00, 250.01). 09/02/17   Arty Baumgartner, NP  Bromfenac Sodium (PROLENSA) 0.07 % SOLN Place 1 drop into both eyes 4 (four) times daily. 08/14/22   Rennis Chris, MD  Cholecalciferol (VITAMIN D3) 50 MCG (2000 UT) TABS Take 2,000 Units by mouth daily.    [provider]  citalopram (CELEXA) 40 MG tablet TAKE 1 TABLET BY MOUTH ONCE DAILY. Patient taking differently: Take 20 mg by mouth daily. 11/25/20 02/03/22  Annita Brod, MD  clopidogrel (PLAVIX) 75 MG tablet Take 1 tablet (75 mg total) by mouth daily. 10/06/19   Margit Hanks, MD  glucose blood test strip Use as instructed 10/15/19   Grayce Sessions, NP  Lancets (ACCU-CHEK SOFT TOUCH) lancets Use as instructed 10/15/19   Grayce Sessions, NP  melatonin 5 MG TABS Take 5 mg by mouth at bedtime.    [provider]  metFORMIN (GLUCOPHAGE-XR) 500 MG 24 hr tablet Take 2 tablets (1,000 mg total) by mouth 2 (two) times daily. 10/06/19   Margit Hanks, MD  prednisoLONE acetate (PRED FORTE) 1 % ophthalmic suspension Place 1 drop into both eyes 4 (four) times daily. 08/14/22   Rennis Chris, MD  tamsulosin (FLOMAX) 0.4 MG CAPS capsule Take 0.4 mg by mouth daily.    [provider]  trimethoprim (TRIMPEX) 100 MG tablet Take 100 mg by mouth daily.    [provider]    Family History Family History  Problem Relation Age of Onset   Hypertension Mother    Hypertension Father     Social History Social History   Tobacco Use   Smoking status: Former   Smokeless tobacco: Never   Tobacco comments:    QUIT IN 2008  Vaping Use   Vaping status: Never Used  Substance Use Topics   Alcohol use: Yes    Comment: RARE   Drug use: No     Allergies   Tape   Review of Systems Review of Systems  Respiratory:  Positive for cough.      Physical Exam Triage Vital Signs ED Triage Vitals  Encounter Vitals Group     BP 09/04/23 1141 (!) 146/78     Systolic BP Percentile --       Diastolic BP Percentile --      Pulse Rate 09/04/23 1141 84     Resp 09/04/23 1141 16     Temp 09/04/23 1141 98.1 F (36.7 C)     Temp Source 09/04/23 1141 Oral     SpO2 09/04/23 1141 98 %     Weight 09/04/23 1140 139 lb (63 kg)     Height 09/04/23 1140 5' (1.524 m)     Head Circumference --      Peak Flow --      Pain Score 09/04/23 1140 8     Pain Loc --      Pain Education --  Exclude from Growth Chart --    No data found.  Updated Vital Signs BP (!) 146/78 (BP Location: Right Arm)   Pulse 84   Temp 98.1 F (36.7 C) (Oral)   Resp 16   Ht 5' (1.524 m)   Wt 63 kg   SpO2 98%   BMI 27.15 kg/m   Visual Acuity Right Eye Distance:   Left Eye Distance:   Bilateral Distance:    Right Eye Near:   Left Eye Near:    Bilateral Near:     Physical Exam Vitals reviewed.  Constitutional:      General: She is not in acute distress.    Appearance: She is not ill-appearing, toxic-appearing or diaphoretic.     Comments: of note the patient is able to converse normally and speaks in complete coherent sentences.  HENT:     Nose: Nose normal.     Mouth/Throat:     Mouth: Mucous membranes are moist.     Pharynx: No oropharyngeal exudate or posterior oropharyngeal erythema.     Comments: There is no erythema of the lips.  I cannot tell that they are swollen, as I do not know what her facial appearance is at baseline.  Eyes:     Extraocular Movements: Extraocular movements intact.     Conjunctiva/sclera: Conjunctivae normal.     Pupils: Pupils are equal, round, and reactive to light.  Cardiovascular:     Rate and Rhythm: Normal rate and regular rhythm.     Heart sounds: No murmur heard. Pulmonary:     Effort: Pulmonary effort is normal.     Breath sounds: Normal breath sounds.  Abdominal:     Palpations: Abdomen is soft.     Tenderness: There is no abdominal tenderness.  Musculoskeletal:     Cervical back: Neck supple.  Lymphadenopathy:     Cervical: No cervical  adenopathy.  Skin:    Coloration: Skin is not jaundiced or pale.  Neurological:     Mental Status: She is alert and oriented to person, place, and time.     Comments: There is a mild left facial droop noted.  Speech is not slurred and is coherent and clearly understandable.  Psychiatric:        Behavior: Behavior normal.      UC Treatments / Results  Labs (all labs ordered are listed, but only abnormal results are displayed) Labs Reviewed - No data to display  EKG   Radiology No results found.  Procedures Procedures (including critical care time)  Medications Ordered in UC Medications - No data to display  Initial Impression / Assessment and Plan / UC Course  I have reviewed the triage vital signs and the nursing notes.  Pertinent labs & imaging results that were available during my care of the patient were reviewed by me and considered in my medical decision making (see chart for details).        Burst of prednisone is sent in for asthma exacerbation and Tessalon Perles are sent in for cough.  Initially her daughter had me understand that her poststroke symptoms possibly worsen when she feels ill.  Then discharge the daughter wanted to know what we were going to do about the possible worsening facial droop.  We have discussed that if they truly feel that her neurologic symptoms are very prominent and worsening she needs to be evaluated in the emergency room.  This patient had COVID about 1 month ago, so I am not going to  test for that today.  Also her granddaughter who has been ill in the same period of time was tested negative for COVID yesterday. Final Clinical Impressions(s) / UC Diagnoses   Final diagnoses:  Mild intermittent asthma with exacerbation  Viral URI with cough     Discharge Instructions      Take prednisone 20 mg--2 daily for 5 days  Take benzonatate 100 mg, 1 tab every 8 hours as needed for cough.  If facial droop is worsening or if you  begin not to be able to speak well or you have new weakness of the leg or arm, please proceed to the emergency room for further evaluation     ED Prescriptions     Medication Sig Dispense Auth. Provider   predniSONE (DELTASONE) 20 MG tablet Take 2 tablets (40 mg total) by mouth daily with breakfast for 5 days. 10 tablet Zenia Resides, MD   benzonatate (TESSALON) 100 MG capsule Take 1 capsule (100 mg total) by mouth 3 (three) times daily as needed for cough. 21 capsule Zenia Resides, MD      PDMP not reviewed this encounter.   Zenia Resides, MD 09/04/23 [phone]

## 2023-09-04 NOTE — ED Triage Notes (Signed)
Patient here today with c/o cough, SOB, wheeze, congestion, and runny nose X 4 days. She has been using Mucinex and has an inhaler with some relief. Her granddaughters are also sick with a cough.

## 2023-09-04 NOTE — Discharge Instructions (Signed)
Take prednisone 20 mg--2 daily for 5 days  Take benzonatate 100 mg, 1 tab every 8 hours as needed for cough.  If facial droop is worsening or if you begin not to be able to speak well or you have new weakness of the leg or arm, please proceed to the emergency room for further evaluation

## 2024-02-25 ENCOUNTER — Other Ambulatory Visit: Payer: Self-pay | Admitting: Internal Medicine

## 2024-02-25 DIAGNOSIS — Z1231 Encounter for screening mammogram for malignant neoplasm of breast: Secondary | ICD-10-CM

## 2024-03-06 ENCOUNTER — Ambulatory Visit
Admission: RE | Admit: 2024-03-06 | Discharge: 2024-03-06 | Disposition: A | Discharge status: Home / Self Care | Payer: Medicare (Managed Care) | Source: Ambulatory Visit | Attending: Internal Medicine | Admitting: Internal Medicine

## 2024-03-06 DIAGNOSIS — Z1231 Encounter for screening mammogram for malignant neoplasm of breast: Secondary | ICD-10-CM

## 2024-03-16 NOTE — Progress Notes (Addendum)
 [facility] - Clinic Note  03/29/2024     CHIEF COMPLAINT Patient presents for Retina Follow Up    HISTORY OF PRESENT ILLNESS: Christina Evans is a 63 y.o. female who presents to the clinic today for:   HPI     Retina Follow Up   Patient presents with  Other (CME).  In right eye.  This started 1.8 years ago.  Severity is moderate.  Duration of 2.5 years.  Since onset it is gradually worsening.  I, the attending physician,  performed the HPI with the patient and updated documentation appropriately.        Comments   Pt presents for retina eval, NPDR OU, CME OD. Pt states vision has become more blurry in the past month with both NVA and DVA. Pt states her eye are straining so she falls asleep after a short time of watching TV. Pt states she sees floaters and black spots in both eyes. Pt denies any flashes/discomfort. Pt noted she fell down the stairs 1 month ago, daughter states she was bruised badly. Daughter believes she should have glasses but they do not have any. Pt has had more trouble recently with dementia.  IDDM since 2008       Last edited by Ronelle Coffee, MD on 03/29/2024 12:37 PM.    Pt has been lost to follow up from September 2023 due to family issues: husband passed away; pt was living in [place] for a while, etc.   Referring physician: [facility] [address] [place],  [place] [address]  HISTORICAL INFORMATION:   Selected notes from the MEDICAL RECORD NUMBER Referred by Dr. Hope Ly for concern of cataract clearance / NPDR LEE:  Ocular Hx- PMH-    CURRENT MEDICATIONS: Current Outpatient Medications (Ophthalmic Drugs)  Medication Sig   Bromfenac  Sodium (PROLENSA ) 0.07 % SOLN Place 1 drop into both eyes 4 (four) times daily.   prednisoLONE  acetate (PRED FORTE ) 1 % ophthalmic suspension Place 1 drop into both eyes 4 (four) times daily.   No current facility-administered medications for this visit.  (Ophthalmic Drugs)   Current Outpatient Medications (Other)  Medication Sig   acetaminophen  (TYLENOL ) 500 MG tablet Take 500 mg by mouth every 6 (six) hours as needed for moderate pain or mild pain.   albuterol  (PROAIR  HFA) 108 (90 Base) MCG/ACT inhaler Inhale 2 puffs into the lungs 4 (four) times daily as needed for wheezing or shortness of breath.   benzonatate  (TESSALON ) 100 MG capsule Take 1 capsule (100 mg total) by mouth 3 (three) times daily as needed for cough.   blood glucose meter kit and supplies Dispense based on patient and insurance preference. Use up to four times daily as directed. (FOR ICD-9 250.00, 250.01).   Cholecalciferol (VITAMIN D3) 50 MCG (2000 UT) TABS Take 2,000 Units by mouth daily.   clopidogrel  (PLAVIX ) 75 MG tablet Take 1 tablet (75 mg total) by mouth daily.   diclofenac Sodium (VOLTAREN) 1 % GEL Apply 2 g topically.   glucose blood test strip Use as instructed   Lancets (ACCU-CHEK SOFT TOUCH) lancets Use as instructed   melatonin 5 MG TABS Take 5 mg by mouth at bedtime.   metFORMIN  (GLUCOPHAGE -XR) 500 MG 24 hr tablet Take 2 tablets (1,000 mg total) by mouth 2 (two) times daily.   tamsulosin (FLOMAX) 0.4 MG CAPS capsule Take 0.4 mg by mouth daily.   trimethoprim (TRIMPEX) 100 MG tablet Take 100 mg  by mouth daily.   atorvastatin  (LIPITOR ) 80 MG tablet TAKE 1 TABLET BY MOUTH ONCE DAILY. (Patient taking differently: Take 40 mg by mouth at bedtime.)   citalopram  (CELEXA ) 40 MG tablet TAKE 1 TABLET BY MOUTH ONCE DAILY. (Patient taking differently: Take 20 mg by mouth daily.)   No current facility-administered medications for this visit. (Other)   REVIEW OF SYSTEMS: ROS   Positive for: Neurological, Musculoskeletal, Endocrine, Eyes, Respiratory, Psychiatric Negative for: Constitutional, Gastrointestinal, Skin, Genitourinary, HENT, Cardiovascular, Allergic/Imm, Heme/Lymph Last edited by Carrington Clack, COT on 03/29/2024 10:06 AM.      ALLERGIES Allergies   Allergen Reactions   Tape Itching and Other (See Comments)    Causes a lot of scratching, also   PAST MEDICAL HISTORY Past Medical History:  Diagnosis Date   Asthma    CVA (cerebral vascular accident) (HCC)    2008 and 2020; left spastic hemiplegia   Dementia (HCC)    Diabetes mellitus without complication (HCC)    Past Surgical History:  Procedure Laterality Date   IR ANGIO INTRA EXTRACRAN SEL COM CAROTID INNOMINATE BILAT MOD SED  09/15/2019   IR ANGIO VERTEBRAL SEL VERTEBRAL UNI L MOD SED  09/15/2019   ORIF ANKLE FRACTURE Left 02/03/2022   Procedure: OPEN TREATMENT OF LEFT BIMALLEOLAR ANKLE FRACTURE;  Surgeon: Donnamarie Gables, MD;  Location[facility] OR;  Service: Orthopedics;  Laterality: Left;  LENGTH OF SURGERY: 90 MINUTES POSITION: SUPINE   SYNDESMOSIS REPAIR Left 02/03/2022   Procedure: SYNDESMOSIS;  Surgeon: Donnamarie Gables, MD;  Location: [facility] OR;  Service: Orthopedics;  Laterality: Left;  LENGTH OF SURGERY: 90 MINUTES POSITION: SUPINE   FAMILY HISTORY Family History  Problem Relation Age of Onset   Hypertension Mother    Hypertension Father    SOCIAL HISTORY Social History   Tobacco Use   Smoking status: Former   Smokeless tobacco: Never   Tobacco comments:    QUIT IN 2008  Vaping Use   Vaping status: Never Used  Substance Use Topics   Alcohol use: Yes    Comment: RARE   Drug use: No       OPHTHALMIC EXAM:  Base Eye Exam     Visual Acuity (Snellen - Linear)       Right Left   Dist [place] 20/50 +1 20/50 +1   Dist ph [place] NI 20/40 -1         Tonometry (Tonopen (holding lids/squeezing), 10:19 AM)       Right Left   Pressure 20 17         Pupils       Pupils Dark Light Shape React APD   Right PERRL 3 2 Round Brisk None   Left PERRL 3 2 Round Brisk None         Visual Fields       Left Right    Full Full         Extraocular Movement       Right Left    Full, Ortho Full, Ortho         Neuro/Psych     Oriented x3: Yes    Mood/Affect: Normal         Dilation     Both eyes: 1.0% Mydriacyl, 2.5% Phenylephrine  @ 10:21 AM           Slit Lamp and Fundus Exam     Slit Lamp Exam       Right Left   Lids/Lashes Dermatochalasis - upper lid,  mild MGD Dermatochalasis - upper lid, mild MGD   Conjunctiva/Sclera temporal pinguecula nasal and temporal pinguecula   Cornea Clear well healed cataract wound   Anterior Chamber deep and clear deep and clear   Iris Round and dilated, No NVI Round and dilated, No NVI   Lens 2+ Nuclear sclerosis, 3+ Cortical cataract, 2+Posterior subcapsular cataract PC IOL in good position   Anterior Vitreous Vitreous syneresis Vitreous syneresis         Fundus Exam       Right Left   Disc Trace Pallor, Sharp rim Pink and Sharp, mild PPA   C/D Ratio 0.6 0.5   Macula Flat, Blunted foveal reflex, scattered MA and cystic changes blunted foveal reflex, scattered MA/ DBH, cystic changes / edema temporal macula, +CWS   Vessels attenuated, mild tortuosity attenuated, Tortuous   Periphery Attached, scattered MA Attached, scattered MA            IMAGING AND PROCEDURES  Imaging and Procedures for 03/29/2024  OCT, Retina - OU - Both Eyes        Right Eye Quality was good. Central Foveal Thickness: 373. Progression has worsened. Findings include no SRF, abnormal foveal contour, intraretinal hyper-reflective material, intraretinal fluid, vitreomacular adhesion (Persistent IRF / edema and interval worsening of foveal contour, partial PVD).   Left Eye Quality was good. Central Foveal Thickness: 314. Progression has improved. Findings include no SRF, abnormal foveal contour, retinal drusen , intraretinal hyper-reflective material, intraretinal fluid (Interval improvement in massive IRF / edema temporal fovea and macula).   Notes  *Images captured and stored on drive  Diagnosis / Impression:  OD: Persistent IRF / edema and interval worsening of foveal contour, partial PVD OS:  Interval improvement in massive IRF / edema temporal fovea and macula  Clinical management:  See below  Abbreviations: NFP - Normal foveal profile. CME - cystoid macular edema. PED - pigment epithelial detachment. IRF - intraretinal fluid. SRF - subretinal fluid. EZ - ellipsoid zone. ERM - epiretinal membrane. ORA - outer retinal atrophy. ORT - outer retinal tubulation. SRHM - subretinal hyper-reflective material. IRHM - intraretinal hyper-reflective material      Intravitreal Injection, Pharmacologic Agent - OD - Right Eye       Time Out 03/29/2024. 11:41 AM. Confirmed correct patient, procedure, site, and patient consented.   Anesthesia Topical anesthesia was used. Anesthetic medications included Lidocaine  2%, Proparacaine 0.5%.   Procedure Preparation included 5% betadine to ocular surface, eyelid speculum. A supplied needle was used.   Injection: 1.25 mg Bevacizumab  1.25mg /0.34ml   Route: Intravitreal, Site: Right Eye   NDC: [id number], Lot: [id number], Expiration date: 04/20/2024   Post-op Post injection exam found visual acuity of at least counting fingers. The patient tolerated the procedure well. There were no complications. The patient received written and verbal post procedure care education.      Intravitreal Injection, Pharmacologic Agent - OS - Left Eye       Time Out 03/29/2024. 11:42 AM. Confirmed correct patient, procedure, site, and patient consented.   Anesthesia Topical anesthesia was used. Anesthetic medications included Lidocaine  2%, Proparacaine 0.5%.   Procedure Preparation included 5% betadine to ocular surface, eyelid speculum. A (32g) needle was used.   Injection: 1.25 mg Bevacizumab  1.25mg /0.79ml   Route: Intravitreal, Site: Left Eye   NDC: H525437, Lot: [id number], Expiration date: 06/27/2024   Post-op Post injection exam found visual acuity of at least counting fingers. The patient tolerated the procedure well. There were no complications. The  patient received  written and verbal post procedure care education.            ASSESSMENT/PLAN:    ICD-10-CM   1. CME (cystoid macular edema), right  H35.351     2. Moderate nonproliferative diabetic retinopathy of both eyes with macular edema associated with type 2 diabetes mellitus (HCC)  E11.3313 OCT, Retina - OU - Both Eyes    Intravitreal Injection, Pharmacologic Agent - OD - Right Eye    Intravitreal Injection, Pharmacologic Agent - OS - Left Eye    Bevacizumab  (AVASTIN ) SOLN 1.25 mg    Bevacizumab  (AVASTIN ) SOLN 1.25 mg    3. Essential hypertension  I10     4. Hypertensive retinopathy of both eyes  H35.033     5. 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 Pseudophakia, both eyes  Z96.1      1. CME OD  - pt lost to follow up from 4 weeks to 6 months (03.22.23 - 09.22.23)  - pt lost to follow up from 4 weeks to 19 months (09.22.23 - 05.07.25)  - OCT shows OD: Persistent IRF / edema and interval worsening of foveal contour, partial PVD  - BCVA OD stable at 20/50  - FA (1.4.23) shows petaloid leakage consistent with CME OD + mild leaking MA OU  - okay to stay off PF and Prolensa  for now  - recommend IVA OD -- see below  - f/u 4 wks for DFE, OCT  2. Moderate nonproliferative diabetic retinopathy OU  - s/p IVA OS #1 (09.22.23)  - +cystic changes/ DME OU - exam with scattered MA OU - FA (1.4.23) shows mild late leaking MA OU - OCT shows OD: Persistent IRF / edema and interval worsening of foveal contour, partial PVD; OS: Interval improvement in massive IRF / edema temporal fovea and macula at 19 months since last appt  - recommend IVA OU (OD #1 and OS #2) today, 05.07.25 for persistent DME  - pt wishes to proceed with injections  - RBA of procedure discussed, questions answered - IVA informed consent obtained and signed, 05.07.25 (OU) - see procedure note - f/u in 4 wks -- DFE/OCT  3,4. Hypertensive retinopathy OU - discussed importance of  tight BP control - monitor  5. Mixed Cataract OD - The symptoms of cataract, surgical options, and treatments and risks were discussed with patient. - discussed diagnosis and progression - monitor for now, but planning on getting re-established at [facility]  6. Pseudophakia OS  - s/p CE/IOL (Dr. Anthoney Kipper)  - IOL in good position, doing well  - monitor   Ophthalmic Meds Ordered this visit:  Meds ordered this encounter  Medications   Bevacizumab  (AVASTIN ) SOLN 1.25 mg   Bevacizumab  (AVASTIN ) SOLN 1.25 mg     Return in about 4 weeks (around 04/26/2024) for f/u NPDR OU, DFE, OCT.  There are no Patient Instructions on file for this visit.   Explained the diagnoses, plan, and follow up with the patient and they expressed understanding.  Patient expressed understanding of the importance of proper follow up care.   This document serves as a record of services personally performed by Jeanice Millard, MD, PhD. It was created on their behalf by Angelia Kelp, an ophthalmic technician. The creation of this record is the provider's dictation and/or activities during the visit.    Electronically signed by: Angelia Kelp, OA, 03/31/24  10:47 PM  This document serves as a record of  services personally performed by Jeanice Millard, MD, PhD. It was created on their behalf by Morley Arabia. Bevin Bucks, OA an ophthalmic technician. The creation of this record is the provider's dictation and/or activities during the visit.    Electronically signed by: Morley Arabia. Bevin Bucks, OA 03/31/24 10:47 PM   Jeanice Millard, M.D., Ph.D. Diseases & Surgery of the Retina and Vitreous [facility]  I have reviewed the above documentation for accuracy and completeness, and I agree with the above. Jeanice Millard, M.D., Ph.D. 03/31/24 10:51 PM   Abbreviations: M myopia (nearsighted); A astigmatism; H hyperopia (farsighted); P presbyopia; Mrx spectacle prescription;  CTL contact lenses; OD right  eye; OS left eye; OU both eyes  XT exotropia; ET esotropia; PEK punctate epithelial keratitis; PEE punctate epithelial erosions; DES dry eye syndrome; MGD meibomian gland dysfunction; ATs artificial tears; PFAT's preservative free artificial tears; NSC nuclear sclerotic cataract; PSC posterior subcapsular cataract; ERM epi-retinal membrane; PVD posterior vitreous detachment; RD retinal detachment; DM diabetes mellitus; DR diabetic retinopathy; NPDR non-proliferative diabetic retinopathy; PDR proliferative diabetic retinopathy; CSME clinically significant macular edema; DME diabetic macular edema; dbh dot blot hemorrhages; CWS cotton wool spot; POAG primary open angle glaucoma; C/D cup-to-disc ratio; HVF humphrey visual field; GVF goldmann visual field; OCT optical coherence tomography; IOP intraocular pressure; BRVO Branch retinal vein occlusion; CRVO central retinal vein occlusion; CRAO central retinal artery occlusion; BRAO branch retinal artery occlusion; RT retinal tear; SB scleral buckle; PPV pars plana vitrectomy; VH Vitreous hemorrhage; PRP panretinal laser photocoagulation; IVK intravitreal kenalog; VMT vitreomacular traction; MH Macular hole;  NVD neovascularization of the disc; NVE neovascularization elsewhere; AREDS age related eye disease study; ARMD age related macular degeneration; POAG primary open angle glaucoma; EBMD epithelial/anterior basement membrane dystrophy; ACIOL anterior chamber intraocular lens; IOL intraocular lens; PCIOL posterior chamber intraocular lens; Phaco/IOL phacoemulsification with intraocular lens placement; PRK photorefractive keratectomy; LASIK laser assisted in situ keratomileusis; HTN hypertension; DM diabetes mellitus; COPD chronic obstructive pulmonary disease

## 2024-03-29 ENCOUNTER — Encounter (INDEPENDENT_AMBULATORY_CARE_PROVIDER_SITE_OTHER): Payer: Self-pay | Admitting: Ophthalmology

## 2024-03-29 ENCOUNTER — Ambulatory Visit (INDEPENDENT_AMBULATORY_CARE_PROVIDER_SITE_OTHER): Payer: Medicare (Managed Care) | Admitting: Ophthalmology

## 2024-03-29 DIAGNOSIS — H35351 Cystoid macular degeneration, right eye: Secondary | ICD-10-CM

## 2024-03-29 DIAGNOSIS — H35033 Hypertensive retinopathy, bilateral: Secondary | ICD-10-CM | POA: Diagnosis not present

## 2024-03-29 DIAGNOSIS — Z961 Presence of intraocular lens: Secondary | ICD-10-CM

## 2024-03-29 DIAGNOSIS — I10 Essential (primary) hypertension: Secondary | ICD-10-CM | POA: Diagnosis not present

## 2024-03-29 DIAGNOSIS — E113313 Type 2 diabetes mellitus with moderate nonproliferative diabetic retinopathy with macular edema, bilateral: Secondary | ICD-10-CM

## 2024-03-29 DIAGNOSIS — H25811 Combined forms of age-related cataract, right eye: Secondary | ICD-10-CM

## 2024-03-29 MED ORDER — BEVACIZUMAB CHEMO INJECTION 1.25MG/0.05ML SYRINGE FOR KALEIDOSCOPE
1.2500 mg | INTRAVITREAL | Status: AC | PRN
  Administered 2024-03-29: 1.25 mg via INTRAVITREAL

## 2024-04-27 NOTE — Progress Notes (Shared)
 [facility] - Clinic Note  04/28/2024     CHIEF COMPLAINT Patient presents for No chief complaint on file.    HISTORY OF PRESENT ILLNESS: Christina Evans is a 63 y.o. female who presents to the clinic today for:    Pt has been lost to follow up from September 2023 due to family issues: husband passed away; pt was living in [place] for a while, etc.   Referring physician: [facility], [facility] [address] [place],  [place] [address]  HISTORICAL INFORMATION:   Selected notes from the MEDICAL RECORD NUMBER Referred by Dr. Hope Ly for concern of cataract clearance / NPDR LEE:  Ocular Hx- PMH-    CURRENT MEDICATIONS: Current Outpatient Medications (Ophthalmic Drugs)  Medication Sig   Bromfenac  Sodium (PROLENSA ) 0.07 % SOLN Place 1 drop into both eyes 4 (four) times daily.   prednisoLONE  acetate (PRED FORTE ) 1 % ophthalmic suspension Place 1 drop into both eyes 4 (four) times daily.   No current facility-administered medications for this visit. (Ophthalmic Drugs)   Current Outpatient Medications (Other)  Medication Sig   acetaminophen  (TYLENOL ) 500 MG tablet Take 500 mg by mouth every 6 (six) hours as needed for moderate pain or mild pain.   albuterol  (PROAIR  HFA) 108 (90 Base) MCG/ACT inhaler Inhale 2 puffs into the lungs 4 (four) times daily as needed for wheezing or shortness of breath.   atorvastatin  (LIPITOR ) 80 MG tablet TAKE 1 TABLET BY MOUTH ONCE DAILY. (Patient taking differently: Take 40 mg by mouth at bedtime.)   benzonatate  (TESSALON ) 100 MG capsule Take 1 capsule (100 mg total) by mouth 3 (three) times daily as needed for cough.   blood glucose meter kit and supplies Dispense based on patient and insurance preference. Use up to four times daily as directed. (FOR ICD-9 250.00, 250.01).   Cholecalciferol (VITAMIN D3) 50 MCG (2000 UT) TABS Take 2,000 Units by mouth daily.   citalopram  (CELEXA ) 40 MG tablet TAKE 1 TABLET BY  MOUTH ONCE DAILY. (Patient taking differently: Take 20 mg by mouth daily.)   clopidogrel  (PLAVIX ) 75 MG tablet Take 1 tablet (75 mg total) by mouth daily.   diclofenac Sodium (VOLTAREN) 1 % GEL Apply 2 g topically.   glucose blood test strip Use as instructed   Lancets (ACCU-CHEK SOFT TOUCH) lancets Use as instructed   melatonin 5 MG TABS Take 5 mg by mouth at bedtime.   metFORMIN  (GLUCOPHAGE -XR) 500 MG 24 hr tablet Take 2 tablets (1,000 mg total) by mouth 2 (two) times daily.   tamsulosin (FLOMAX) 0.4 MG CAPS capsule Take 0.4 mg by mouth daily.   trimethoprim (TRIMPEX) 100 MG tablet Take 100 mg by mouth daily.   No current facility-administered medications for this visit. (Other)   REVIEW OF SYSTEMS:    ALLERGIES Allergies  Allergen Reactions   Tape Itching and Other (See Comments)    Causes a lot of scratching, also   PAST MEDICAL HISTORY Past Medical History:  Diagnosis Date   Asthma    CVA (cerebral vascular accident) (HCC)    2008 and 2020; left spastic hemiplegia   Dementia (HCC)    Diabetes mellitus without complication (HCC)    Past Surgical History:  Procedure Laterality Date   IR ANGIO INTRA EXTRACRAN SEL COM CAROTID INNOMINATE BILAT MOD SED  09/15/2019   IR ANGIO VERTEBRAL SEL VERTEBRAL UNI L MOD SED  09/15/2019   ORIF ANKLE FRACTURE Left 02/03/2022   Procedure:  OPEN TREATMENT OF LEFT BIMALLEOLAR ANKLE FRACTURE;  Surgeon: Donnamarie Gables, MD;  Location[facility] OR;  Service: Orthopedics;  Laterality: Left;  LENGTH OF SURGERY: 90 MINUTES POSITION: SUPINE   SYNDESMOSIS REPAIR Left 02/03/2022   Procedure: SYNDESMOSIS;  Surgeon: Donnamarie Gables, MD;  Location: [facility] OR;  Service: Orthopedics;  Laterality: Left;  LENGTH OF SURGERY: 90 MINUTES POSITION: SUPINE   FAMILY HISTORY Family History  Problem Relation Age of Onset   Hypertension Mother    Hypertension Father    SOCIAL HISTORY Social History   Tobacco Use   Smoking status: Former   Smokeless tobacco:  Never   Tobacco comments:    QUIT IN 2008  Vaping Use   Vaping status: Never Used  Substance Use Topics   Alcohol use: Yes    Comment: RARE   Drug use: No       OPHTHALMIC EXAM:  Not recorded     IMAGING AND PROCEDURES  Imaging and Procedures for 04/28/2024          ASSESSMENT/PLAN:  No diagnosis found.  1. CME OD  - pt lost to follow up from 4 weeks to 6 months (03.22.23 - 09.22.23)  - pt lost to follow up from 4 weeks to 19 months (09.22.23 - 05.07.25)  - OCT shows OD: Persistent IRF / edema and interval worsening of foveal contour, partial PVD  - BCVA OD stable at 20/50  - FA (1.4.23) shows petaloid leakage consistent with CME OD + mild leaking MA OU  - okay to stay off PF and Prolensa  for now  - recommend IVA OD -- see below  - f/u 4 wks for DFE, OCT  2. Moderate nonproliferative diabetic retinopathy OU  -s/p IVA OD (05.07.25)  - s/p IVA OS #2 (22.23) #2(05.07.25)  - +cystic changes/ DME OU - exam with scattered MA OU - FA (1.4.23) shows mild late leaking MA OU - OCT shows OD: Persistent IRF / edema and interval worsening of foveal contour, partial PVD; OS: Interval improvement in massive IRF / edema temporal fovea and macula at 19 months since last appt  - recommend IVA OU (OD #2 06.07.25and OS #3) today, 06.07.25 for persistent DME  - pt wishes to proceed with injections  - RBA of procedure discussed, questions answered - IVA informed consent obtained and signed, 05.06.25 (OU) - see procedure note - f/u in 4 wks -- DFE/OCT  3,4. Hypertensive retinopathy OU - discussed importance of tight BP control - monitor  5. Mixed Cataract OD - The symptoms of cataract, surgical options, and treatments and risks were discussed with patient. - discussed diagnosis and progression - monitor for now, but planning on getting re-established at [facility]  6. Pseudophakia OS  - s/p CE/IOL (Dr. Anthoney Kipper)  - IOL in good position, doing well  -  monitor   Ophthalmic Meds Ordered this visit:  No orders of the defined types were placed in this encounter.    No follow-ups on file.  There are no Patient Instructions on file for this visit.  This document serves as a record of services personally performed by Jeanice Millard, MD, PhD. It was created on their behalf by Eller Gut COT, an ophthalmic technician. The creation of this record is the provider's dictation and/or activities during the visit.    Electronically signed by: Eller Gut COT 06.05.25 8:56 AM   Abbreviations: M myopia (nearsighted); A astigmatism; H hyperopia (farsighted); P presbyopia; Mrx spectacle prescription;  CTL contact  lenses; OD right eye; OS left eye; OU both eyes  XT exotropia; ET esotropia; PEK punctate epithelial keratitis; PEE punctate epithelial erosions; DES dry eye syndrome; MGD meibomian gland dysfunction; ATs artificial tears; PFAT's preservative free artificial tears; NSC nuclear sclerotic cataract; PSC posterior subcapsular cataract; ERM epi-retinal membrane; PVD posterior vitreous detachment; RD retinal detachment; DM diabetes mellitus; DR diabetic retinopathy; NPDR non-proliferative diabetic retinopathy; PDR proliferative diabetic retinopathy; CSME clinically significant macular edema; DME diabetic macular edema; dbh dot blot hemorrhages; CWS cotton wool spot; POAG primary open angle glaucoma; C/D cup-to-disc ratio; HVF humphrey visual field; GVF goldmann visual field; OCT optical coherence tomography; IOP intraocular pressure; BRVO Branch retinal vein occlusion; CRVO central retinal vein occlusion; CRAO central retinal artery occlusion; BRAO branch retinal artery occlusion; RT retinal tear; SB scleral buckle; PPV pars plana vitrectomy; VH Vitreous hemorrhage; PRP panretinal laser photocoagulation; IVK intravitreal kenalog; VMT vitreomacular traction; MH Macular hole;  NVD neovascularization of the disc; NVE neovascularization elsewhere;  AREDS age related eye disease study; ARMD age related macular degeneration; POAG primary open angle glaucoma; EBMD epithelial/anterior basement membrane dystrophy; ACIOL anterior chamber intraocular lens; IOL intraocular lens; PCIOL posterior chamber intraocular lens; Phaco/IOL phacoemulsification with intraocular lens placement; PRK photorefractive keratectomy; LASIK laser assisted in situ keratomileusis; HTN hypertension; DM diabetes mellitus; COPD chronic obstructive pulmonary disease

## 2024-04-28 ENCOUNTER — Encounter (INDEPENDENT_AMBULATORY_CARE_PROVIDER_SITE_OTHER): Payer: Medicare (Managed Care) | Admitting: Ophthalmology

## 2024-04-28 ENCOUNTER — Encounter (INDEPENDENT_AMBULATORY_CARE_PROVIDER_SITE_OTHER): Payer: Self-pay

## 2024-04-28 DIAGNOSIS — I10 Essential (primary) hypertension: Secondary | ICD-10-CM

## 2024-04-28 DIAGNOSIS — H35033 Hypertensive retinopathy, bilateral: Secondary | ICD-10-CM

## 2024-04-28 DIAGNOSIS — H25811 Combined forms of age-related cataract, right eye: Secondary | ICD-10-CM

## 2024-04-28 DIAGNOSIS — E113313 Type 2 diabetes mellitus with moderate nonproliferative diabetic retinopathy with macular edema, bilateral: Secondary | ICD-10-CM

## 2024-04-28 DIAGNOSIS — H35351 Cystoid macular degeneration, right eye: Secondary | ICD-10-CM

## 2024-04-28 DIAGNOSIS — Z961 Presence of intraocular lens: Secondary | ICD-10-CM

## 2024-05-19 ENCOUNTER — Encounter (HOSPITAL_COMMUNITY): Payer: Self-pay | Admitting: Interventional Radiology

## 2024-06-21 NOTE — Progress Notes (Signed)
 [facility] - Clinic Note  07/05/2024     CHIEF COMPLAINT Patient presents for Retina Follow Up    HISTORY OF PRESENT ILLNESS: Christina Evans is a 63 y.o. female who presents to the clinic today for:   HPI     Retina Follow Up   Patient presents with  Diabetic Retinopathy.  In right eye.  Severity is moderate.  Duration of 14 weeks.  Since onset it is stable.  I, the attending physician,  performed the HPI with the patient and updated documentation appropriately.        Comments   14 weeks Retina eval. Patient delayed 4 weeks. Patient states no changes in vision. Blood sugar unknown and A1C unknown      Last edited by Valdemar Rogue, MD on 07/05/2024  2:44 PM.     Pt states she does feel VA is blurry.    Referring physician: [facility] [address] [place],  [place] [address]  HISTORICAL INFORMATION:   Selected notes from the MEDICAL RECORD NUMBER Referred by Dr. Medford Gaudy for concern of cataract clearance / NPDR LEE:  Ocular Hx- PMH-    CURRENT MEDICATIONS: Current Outpatient Medications (Ophthalmic Drugs)  Medication Sig   Bromfenac  Sodium (PROLENSA ) 0.07 % SOLN Place 1 drop into both eyes 4 (four) times daily.   prednisoLONE  acetate (PRED FORTE ) 1 % ophthalmic suspension Place 1 drop into both eyes 4 (four) times daily.   No current facility-administered medications for this visit. (Ophthalmic Drugs)   Current Outpatient Medications (Other)  Medication Sig   acetaminophen  (TYLENOL ) 500 MG tablet Take 500 mg by mouth every 6 (six) hours as needed for moderate pain or mild pain.   albuterol  (PROAIR  HFA) 108 (90 Base) MCG/ACT inhaler Inhale 2 puffs into the lungs 4 (four) times daily as needed for wheezing or shortness of breath.   atorvastatin  (LIPITOR ) 80 MG tablet TAKE 1 TABLET BY MOUTH ONCE DAILY.   benzonatate  (TESSALON ) 100 MG capsule Take 1 capsule (100 mg total) by mouth 3 (three) times daily as  needed for cough.   blood glucose meter kit and supplies Dispense based on patient and insurance preference. Use up to four times daily as directed. (FOR ICD-9 250.00, 250.01).   Cholecalciferol (VITAMIN D3) 50 MCG (2000 UT) TABS Take 2,000 Units by mouth daily.   citalopram  (CELEXA ) 40 MG tablet TAKE 1 TABLET BY MOUTH ONCE DAILY.   clopidogrel  (PLAVIX ) 75 MG tablet Take 1 tablet (75 mg total) by mouth daily.   diclofenac Sodium (VOLTAREN) 1 % GEL Apply 2 g topically.   glucose blood test strip Use as instructed   Lancets (ACCU-CHEK SOFT TOUCH) lancets Use as instructed   melatonin 5 MG TABS Take 5 mg by mouth at bedtime.   metFORMIN  (GLUCOPHAGE -XR) 500 MG 24 hr tablet Take 2 tablets (1,000 mg total) by mouth 2 (two) times daily.   tamsulosin (FLOMAX) 0.4 MG CAPS capsule Take 0.4 mg by mouth daily.   trimethoprim (TRIMPEX) 100 MG tablet Take 100 mg by mouth daily.   No current facility-administered medications for this visit. (Other)   REVIEW OF SYSTEMS: ROS   Positive for: Neurological, Musculoskeletal, Endocrine, Eyes, Respiratory, Psychiatric Negative for: Constitutional, Gastrointestinal, Skin, Genitourinary, HENT, Cardiovascular, Allergic/Imm, Heme/Lymph Last edited by German Olam BRAVO, COT on 07/05/2024  1:18 PM.       ALLERGIES Allergies  Allergen Reactions   Tape Itching and Other (See Comments)  Causes a lot of scratching, also   PAST MEDICAL HISTORY Past Medical History:  Diagnosis Date   Asthma    CVA (cerebral vascular accident) (HCC)    2008 and 2020; left spastic hemiplegia   Dementia (HCC)    Diabetes mellitus without complication (HCC)    Past Surgical History:  Procedure Laterality Date   IR ANGIO INTRA EXTRACRAN SEL COM CAROTID INNOMINATE BILAT MOD SED  09/15/2019   IR ANGIO VERTEBRAL SEL VERTEBRAL UNI L MOD SED  09/15/2019   ORIF ANKLE FRACTURE Left 02/03/2022   Procedure: OPEN TREATMENT OF LEFT BIMALLEOLAR ANKLE FRACTURE;  Surgeon: Elsa Lonni SAUNDERS,  MD;  Location: [facility] OR;  Service: Orthopedics;  Laterality: Left;  LENGTH OF SURGERY: 90 MINUTES POSITION: SUPINE   SYNDESMOSIS REPAIR Left 02/03/2022   Procedure: SYNDESMOSIS;  Surgeon: Elsa Lonni SAUNDERS, MD;  Location: [facility] OR;  Service: Orthopedics;  Laterality: Left;  LENGTH OF SURGERY: 90 MINUTES POSITION: SUPINE   FAMILY HISTORY Family History  Problem Relation Age of Onset   Hypertension Mother    Hypertension Father    SOCIAL HISTORY Social History   Tobacco Use   Smoking status: Former   Smokeless tobacco: Never   Tobacco comments:    QUIT IN 2008  Vaping Use   Vaping status: Never Used  Substance Use Topics   Alcohol use: Yes    Comment: RARE   Drug use: No       OPHTHALMIC EXAM:  Base Eye Exam     Visual Acuity (Snellen - Linear)       Right Left   Dist [place] 20/50 20/50   Dist ph [place] 20/NI 20/NI         Tonometry (Tonopen, 1:30 PM)       Right Left   Pressure 18 14         Pupils       Dark Light Shape React APD   Right 3 2 Round Brisk None   Left 3 2 Round Brisk None         Visual Fields (Counting fingers)       Left Right    Full Full         Extraocular Movement       Right Left    Full, Ortho Full, Ortho         Neuro/Psych     Oriented x3: Yes   Mood/Affect: Normal         Dilation     Both eyes: 1.0% Mydriacyl, 2.5% Phenylephrine  @ 1:31 PM           Slit Lamp and Fundus Exam     Slit Lamp Exam       Right Left   Lids/Lashes Dermatochalasis - upper lid, mild MGD Dermatochalasis - upper lid, mild MGD   Conjunctiva/Sclera temporal pinguecula nasal and temporal pinguecula   Cornea Clear well healed cataract wound   Anterior Chamber deep and clear deep and clear   Iris Round and dilated, No NVI Round and dilated, No NVI   Lens 2+ Nuclear sclerosis, 3+ Cortical cataract, 2+Posterior subcapsular cataract PC IOL in good position   Anterior Vitreous Vitreous syneresis Vitreous syneresis         Fundus Exam        Right Left   Disc Trace Pallor, Sharp rim Pink and Sharp, mild PPA   C/D Ratio 0.6 0.5   Macula Flat, Blunted foveal reflex, scattered MA and cystic changes blunted foveal reflex,  scattered MA/ DBH, cystic changes / edema temporal macula--slightly increased, +CWS   Vessels attenuated, mild tortuosity attenuated, Tortuous   Periphery Attached, scattered MA Attached, scattered MA           Refraction     Manifest Refraction (Auto)       Sphere Cylinder Axis Dist VA   Right -1.50 +1.00 018 20/40   Left +0.00 +1.25 023 20/25-3            IMAGING AND PROCEDURES  Imaging and Procedures for 07/05/2024  OCT, Retina - OU - Both Eyes       Right Eye Quality was good. Central Foveal Thickness: 360. Progression has worsened. Findings include no SRF, abnormal foveal contour, intraretinal hyper-reflective material, intraretinal fluid, vitreomacular adhesion (Scattered persistent IRF / edema with mild interval increase centrally, partial PVD).   Left Eye Quality was good. Central Foveal Thickness: 363. Progression has worsened. Findings include no SRF, abnormal foveal contour, retinal drusen , intraretinal hyper-reflective material, intraretinal fluid (Interval increase in IRF / edema temporal fovea and macula).   Notes *Images captured and stored on drive  Diagnosis / Impression:  OD: Scattered persistent IRF / edema with mild interval increase centrally, partial PVD OS: Interval increase in IRF / edema temporal fovea and macula  Clinical management:  See below  Abbreviations: NFP - Normal foveal profile. CME - cystoid macular edema. PED - pigment epithelial detachment. IRF - intraretinal fluid. SRF - subretinal fluid. EZ - ellipsoid zone. ERM - epiretinal membrane. ORA - outer retinal atrophy. ORT - outer retinal tubulation. SRHM - subretinal hyper-reflective material. IRHM - intraretinal hyper-reflective material      Intravitreal Injection, Pharmacologic Agent - OD -  Right Eye       Time Out 07/05/2024. 2:27 PM. Confirmed correct patient, procedure, site, and patient consented.   Anesthesia Topical anesthesia was used. Anesthetic medications included Lidocaine  2%, Proparacaine 0.5%.   Procedure Preparation included 5% betadine to ocular surface, eyelid speculum. A supplied needle was used.   Injection: 1.25 mg Bevacizumab  1.25mg /0.80ml   Route: Intravitreal, Site: Right Eye   NDC: [id number], Lot: [id number], Expiration date: 07/17/2024   Post-op Post injection exam found visual acuity of at least counting fingers. The patient tolerated the procedure well. There were no complications. The patient received written and verbal post procedure care education.      Intravitreal Injection, Pharmacologic Agent - OS - Left Eye       Time Out 07/05/2024. 2:27 PM. Confirmed correct patient, procedure, site, and patient consented.   Anesthesia Topical anesthesia was used. Anesthetic medications included Lidocaine  2%, Proparacaine 0.5%.   Procedure Preparation included 5% betadine to ocular surface, eyelid speculum. A supplied (32g) needle was used.   Injection: 1.25 mg Bevacizumab  1.25mg /0.62ml   Route: Intravitreal, Site: Left Eye   NDC: [id number], Lot: [id number], Expiration date: 08/05/2024   Post-op Post injection exam found visual acuity of at least counting fingers. The patient tolerated the procedure well. There were no complications. The patient received written and verbal post procedure care education.             ASSESSMENT/PLAN:    ICD-10-CM   1. Moderate nonproliferative diabetic retinopathy of both eyes with macular edema associated with type 2 diabetes mellitus (HCC)  E11.3313 OCT, Retina - OU - Both Eyes    Intravitreal Injection, Pharmacologic Agent - OD - Right Eye    Intravitreal Injection, Pharmacologic Agent - OS - Left Eye    Bevacizumab  (AVASTIN ) SOLN 1.25  mg    Bevacizumab  (AVASTIN ) SOLN 1.25 mg    2. CME (cystoid  macular edema), right  H35.351     3. Essential hypertension  I10     4. Hypertensive retinopathy of both eyes  H35.033     5. 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 Pseudophakia, both eyes  Z96.1      1. Moderate nonproliferative diabetic retinopathy OU  - pt lost to follow up from 4 weeks to 6 months (03.22.23 - 09.22.23)  - pt lost to follow up from 4 weeks to 19 months (09.22.23 - 05.07.25)  - pt lost to folllow up from 4 weeks to 3 months (05.07.25-08.13.25)  - s/p IVA OD #1 (05.07.25)  - s/p IVA OS #1 (09.22.23), #2 (05.07.25)  - +cystic changes/ DME OU - exam with scattered MA OU - FA (1.4.23) shows mild late leaking MA OU - OCT shows OD: Scattered persistent IRF / edema with mild interval increase centrally, partial PVD; OS: Interval increase in IRF / edema temporal fovea and macula at 3 months since last appt  - recommend IVA OU (OD #2 and OS #3) today, (08.13.25) for persistent DME  - pt wishes to proceed with injections  - RBA of procedure discussed, questions answered - IVA informed consent obtained and signed, 05.07.25 (OU) - see procedure note - f/u in 4 wks -- DFE/OCT  2. CME OD  - OCT shows OD: Scattered persistent IRF / edema with mild interval increase centrally, partial PVD  - BCVA OD stable at 20/50  - FA (1.4.23) shows petaloid leakage consistent with CME OD + mild leaking MA OU  - okay to stay off PF and Prolensa  for now  - recommend IVA OD -- see above  - f/u 4 wks for DFE, OCT  3,4. Hypertensive retinopathy OU - discussed importance of tight BP control - monitor  5. Mixed Cataract OD - The symptoms of cataract, surgical options, and treatments and risks were discussed with patient. - discussed diagnosis and progression - monitor for now, but planning on getting re-established at [facility]  6. Pseudophakia OS  - s/p CE/IOL (Dr. KYM Gaudy)  - IOL in good position, doing well  - monitor   Ophthalmic Meds Ordered  this visit:  Meds ordered this encounter  Medications   Bevacizumab  (AVASTIN ) SOLN 1.25 mg   Bevacizumab  (AVASTIN ) SOLN 1.25 mg     Return in about 4 weeks (around 08/02/2024) for NPDR OU, DFE, OCT, Possible Injxn.  There are no Patient Instructions on file for this visit.   Explained the diagnoses, plan, and follow up with the patient and they expressed understanding.  Patient expressed understanding of the importance of proper follow up care.   This document serves as a record of services personally performed by Redell JUDITHANN Hans, MD, PhD. It was created on their behalf by Avelina Pereyra, COA an ophthalmic technician. The creation of this record is the provider's dictation and/or activities during the visit.   Electronically signed by: Avelina GORMAN Pereyra, COT  07/09/24  1:55 PM   Redell JUDITHANN Hans, M.D., Ph.D. Diseases & Surgery of the Retina and Vitreous [facility]  I have reviewed the above documentation for accuracy and completeness, and I agree with the above. Redell JUDITHANN Hans, M.D., Ph.D. 07/09/24 2:03 PM   Abbreviations: M myopia (nearsighted); A astigmatism; H hyperopia (farsighted); P presbyopia; Mrx spectacle prescription;  CTL contact  lenses; OD right eye; OS left eye; OU both eyes  XT exotropia; ET esotropia; PEK punctate epithelial keratitis; PEE punctate epithelial erosions; DES dry eye syndrome; MGD meibomian gland dysfunction; ATs artificial tears; PFAT's preservative free artificial tears; NSC nuclear sclerotic cataract; PSC posterior subcapsular cataract; ERM epi-retinal membrane; PVD posterior vitreous detachment; RD retinal detachment; DM diabetes mellitus; DR diabetic retinopathy; NPDR non-proliferative diabetic retinopathy; PDR proliferative diabetic retinopathy; CSME clinically significant macular edema; DME diabetic macular edema; dbh dot blot hemorrhages; CWS cotton wool spot; POAG primary open angle glaucoma; C/D cup-to-disc ratio; HVF humphrey visual  field; GVF goldmann visual field; OCT optical coherence tomography; IOP intraocular pressure; BRVO Branch retinal vein occlusion; CRVO central retinal vein occlusion; CRAO central retinal artery occlusion; BRAO branch retinal artery occlusion; RT retinal tear; SB scleral buckle; PPV pars plana vitrectomy; VH Vitreous hemorrhage; PRP panretinal laser photocoagulation; IVK intravitreal kenalog; VMT vitreomacular traction; MH Macular hole;  NVD neovascularization of the disc; NVE neovascularization elsewhere; AREDS age related eye disease study; ARMD age related macular degeneration; POAG primary open angle glaucoma; EBMD epithelial/anterior basement membrane dystrophy; ACIOL anterior chamber intraocular lens; IOL intraocular lens; PCIOL posterior chamber intraocular lens; Phaco/IOL phacoemulsification with intraocular lens placement; PRK photorefractive keratectomy; LASIK laser assisted in situ keratomileusis; HTN hypertension; DM diabetes mellitus; COPD chronic obstructive pulmonary disease

## 2024-07-05 ENCOUNTER — Ambulatory Visit (INDEPENDENT_AMBULATORY_CARE_PROVIDER_SITE_OTHER): Payer: Medicare (Managed Care) | Admitting: Ophthalmology

## 2024-07-05 ENCOUNTER — Encounter (INDEPENDENT_AMBULATORY_CARE_PROVIDER_SITE_OTHER): Payer: Self-pay | Admitting: Ophthalmology

## 2024-07-05 DIAGNOSIS — H35033 Hypertensive retinopathy, bilateral: Secondary | ICD-10-CM | POA: Diagnosis not present

## 2024-07-05 DIAGNOSIS — H35351 Cystoid macular degeneration, right eye: Secondary | ICD-10-CM | POA: Diagnosis not present

## 2024-07-05 DIAGNOSIS — H25811 Combined forms of age-related cataract, right eye: Secondary | ICD-10-CM

## 2024-07-05 DIAGNOSIS — I10 Essential (primary) hypertension: Secondary | ICD-10-CM | POA: Diagnosis not present

## 2024-07-05 DIAGNOSIS — E113313 Type 2 diabetes mellitus with moderate nonproliferative diabetic retinopathy with macular edema, bilateral: Secondary | ICD-10-CM

## 2024-07-05 DIAGNOSIS — Z961 Presence of intraocular lens: Secondary | ICD-10-CM

## 2024-07-05 MED ORDER — BEVACIZUMAB CHEMO INJECTION 1.25MG/0.05ML SYRINGE FOR KALEIDOSCOPE
1.2500 mg | INTRAVITREAL | Status: AC | PRN
  Administered 2024-07-05 (×2): 1.25 mg via INTRAVITREAL

## 2024-07-26 NOTE — Progress Notes (Signed)
 [facility] - Clinic Note  08/02/2024     CHIEF COMPLAINT Patient presents for Retina Follow Up    HISTORY OF PRESENT ILLNESS: Christina Evans is a 63 y.o. female who presents to the clinic today for:   HPI     Retina Follow Up   Patient presents with  Diabetic Retinopathy.  In both eyes.  Severity is moderate.  Duration of 4 weeks.  Since onset it is stable.  I, the attending physician,  performed the HPI with the patient and updated documentation appropriately.        Comments   Pt denies any changes in vision/FOL/floaters/pain. Pt does not use ats. BS/A1c=unknown      Last edited by Valdemar Rogue, MD on 08/02/2024  6:41 PM.    Pt states she has a reaction after the injections. The eyes itch, water, burn, and get really red. She tired artifical tears and it did not help at all.   Referring physician: Cloria Annabella CROME, DO [address] [place],  [place] [address]  HISTORICAL INFORMATION:   Selected notes from the MEDICAL RECORD NUMBER Referred by Dr. Medford Gaudy for concern of cataract clearance / NPDR LEE:  Ocular Hx- PMH-    CURRENT MEDICATIONS: Current Outpatient Medications (Ophthalmic Drugs)  Medication Sig   Bromfenac  Sodium (PROLENSA ) 0.07 % SOLN Place 1 drop into both eyes 4 (four) times daily.   prednisoLONE  acetate (PRED FORTE ) 1 % ophthalmic suspension Place 1 drop into both eyes 4 (four) times daily.   No current facility-administered medications for this visit. (Ophthalmic Drugs)   Current Outpatient Medications (Other)  Medication Sig   acetaminophen  (TYLENOL ) 500 MG tablet Take 500 mg by mouth every 6 (six) hours as needed for moderate pain or mild pain.   albuterol  (PROAIR  HFA) 108 (90 Base) MCG/ACT inhaler Inhale 2 puffs into the lungs 4 (four) times daily as needed for wheezing or shortness of breath.   atorvastatin  (LIPITOR ) 80 MG tablet TAKE 1 TABLET BY MOUTH ONCE DAILY.   benzonatate  (TESSALON ) 100 MG capsule Take 1  capsule (100 mg total) by mouth 3 (three) times daily as needed for cough.   blood glucose meter kit and supplies Dispense based on patient and insurance preference. Use up to four times daily as directed. (FOR ICD-9 250.00, 250.01).   Cholecalciferol (VITAMIN D3) 50 MCG (2000 UT) TABS Take 2,000 Units by mouth daily.   citalopram  (CELEXA ) 40 MG tablet TAKE 1 TABLET BY MOUTH ONCE DAILY.   clopidogrel  (PLAVIX ) 75 MG tablet Take 1 tablet (75 mg total) by mouth daily.   diclofenac Sodium (VOLTAREN) 1 % GEL Apply 2 g topically.   glucose blood test strip Use as instructed   Lancets (ACCU-CHEK SOFT TOUCH) lancets Use as instructed   melatonin 5 MG TABS Take 5 mg by mouth at bedtime.   metFORMIN  (GLUCOPHAGE -XR) 500 MG 24 hr tablet Take 2 tablets (1,000 mg total) by mouth 2 (two) times daily.   tamsulosin (FLOMAX) 0.4 MG CAPS capsule Take 0.4 mg by mouth daily.   trimethoprim (TRIMPEX) 100 MG tablet Take 100 mg by mouth daily.   No current facility-administered medications for this visit. (Other)   REVIEW OF SYSTEMS: ROS   Positive for: Neurological, Musculoskeletal, Endocrine, Eyes, Respiratory, Psychiatric Negative for: Constitutional, Gastrointestinal, Skin, Genitourinary, HENT, Cardiovascular, Allergic/Imm, Heme/Lymph Last edited by Elnor Avelina RAMAN, COT on 08/02/2024  2:20 PM.     ALLERGIES Allergies  Allergen Reactions   Tape Itching  and Other (See Comments)    Causes a lot of scratching, also   PAST MEDICAL HISTORY Past Medical History:  Diagnosis Date   Asthma    CVA (cerebral vascular accident) (HCC)    2008 and 2020; left spastic hemiplegia   Dementia (HCC)    Diabetes mellitus without complication (HCC)    Past Surgical History:  Procedure Laterality Date   IR ANGIO INTRA EXTRACRAN SEL COM CAROTID INNOMINATE BILAT MOD SED  09/15/2019   IR ANGIO VERTEBRAL SEL VERTEBRAL UNI L MOD SED  09/15/2019   ORIF ANKLE FRACTURE Left 02/03/2022   Procedure: OPEN TREATMENT OF LEFT  BIMALLEOLAR ANKLE FRACTURE;  Surgeon: Elsa Lonni SAUNDERS, MD;  Location: [facility] OR;  Service: Orthopedics;  Laterality: Left;  LENGTH OF SURGERY: 90 MINUTES POSITION: SUPINE   SYNDESMOSIS REPAIR Left 02/03/2022   Procedure: SYNDESMOSIS;  Surgeon: Elsa Lonni SAUNDERS, MD;  Location: [facility] OR;  Service: Orthopedics;  Laterality: Left;  LENGTH OF SURGERY: 90 MINUTES POSITION: SUPINE   FAMILY HISTORY Family History  Problem Relation Age of Onset   Hypertension Mother    Hypertension Father    SOCIAL HISTORY Social History   Tobacco Use   Smoking status: Former   Smokeless tobacco: Never   Tobacco comments:    QUIT IN 2008  Vaping Use   Vaping status: Never Used  Substance Use Topics   Alcohol use: Yes    Comment: RARE   Drug use: No       OPHTHALMIC EXAM:  Base Eye Exam     Visual Acuity (Snellen - Linear)       Right Left   Dist [place] 20/50 -2 20/50 +1   Dist ph [place] 20/40 20/40 +2         Tonometry (Tonopen, 2:14 PM)       Right Left   Pressure 18 20         Pupils       Pupils Dark Light Shape React APD   Right PERRL 3 2 Round Brisk None   Left PERRL 3 2 Round Brisk None         Visual Fields       Left Right    Full Full         Extraocular Movement       Right Left    Full, Ortho Full, Ortho         Neuro/Psych     Oriented x3: Yes   Mood/Affect: Normal         Dilation     Both eyes: 1.0% Mydriacyl, 2.5% Phenylephrine  @ 2:15 PM           Slit Lamp and Fundus Exam     Slit Lamp Exam       Right Left   Lids/Lashes Dermatochalasis - upper lid, mild MGD Dermatochalasis - upper lid, mild MGD   Conjunctiva/Sclera temporal pinguecula nasal and temporal pinguecula   Cornea Clear well healed cataract wound   Anterior Chamber deep and clear deep and clear   Iris Round and dilated, No NVI Round and dilated, No NVI   Lens 2+ Nuclear sclerosis, 3+ Cortical cataract, 2+Posterior subcapsular cataract PC IOL in good position   Anterior  Vitreous Vitreous syneresis Vitreous syneresis         Fundus Exam       Right Left   Disc Trace Pallor, Sharp rim Pink and Sharp, mild PPA   C/D Ratio 0.6 0.5   Macula Flat,  Blunted foveal reflex, scattered MA and cystic changes-- slightly improved blunted foveal reflex, scattered MA/ DBH, cystic changes / edema temporal macula +CWS- improved   Vessels attenuated, mild tortuosity attenuated, Tortuous   Periphery Attached, scattered MA Attached, scattered MA            IMAGING AND PROCEDURES  Imaging and Procedures for 08/02/2024  OCT, Retina - OU - Both Eyes       Right Eye Quality was good. Central Foveal Thickness: 363. Progression has improved. Findings include no SRF, abnormal foveal contour, intraretinal hyper-reflective material, intraretinal fluid, vitreomacular adhesion (Scattered persistent IRF / edema-- slightly improved, partial PVD).   Left Eye Quality was good. Central Foveal Thickness: 412. Progression has worsened. Findings include no SRF, abnormal foveal contour, retinal drusen , intraretinal hyper-reflective material, intraretinal fluid (Persistent IRF / edema temporal fovea and macula --slightly increased).   Notes *Images captured and stored on drive  Diagnosis / Impression:  OD: Scattered persistent IRF / edema-- slightly improved,  partial PVD ND:Ezmdpduzwu IRF / edema temporal fovea and macula --slightly increased  Clinical management:  See below  Abbreviations: NFP - Normal foveal profile. CME - cystoid macular edema. PED - pigment epithelial detachment. IRF - intraretinal fluid. SRF - subretinal fluid. EZ - ellipsoid zone. ERM - epiretinal membrane. ORA - outer retinal atrophy. ORT - outer retinal tubulation. SRHM - subretinal hyper-reflective material. IRHM - intraretinal hyper-reflective material      Intravitreal Injection, Pharmacologic Agent - OD - Right Eye       Time Out 08/02/2024. 3:01 PM. Confirmed correct patient, procedure, site,  and patient consented.   Anesthesia Topical anesthesia was used. Anesthetic medications included Lidocaine  2%, Proparacaine 0.5%.   Procedure Preparation included 5% betadine to ocular surface, eyelid speculum. A supplied needle was used.   Injection: 1.25 mg Bevacizumab  1.25mg /0.80ml   Route: Intravitreal, Site: Right Eye   NDC: [id number], Lot: [id number], Expiration date: 08/13/2024   Post-op Post injection exam found visual acuity of at least counting fingers. The patient tolerated the procedure well. There were no complications. The patient received written and verbal post procedure care education.      Intravitreal Injection, Pharmacologic Agent - OS - Left Eye       Time Out 08/02/2024. 3:02 PM. Confirmed correct patient, procedure, site, and patient consented.   Anesthesia Topical anesthesia was used. Anesthetic medications included Lidocaine  2%, Proparacaine 0.5%.   Procedure Preparation included 5% betadine to ocular surface, eyelid speculum. A supplied (32g) needle was used.   Injection: 1.25 mg Bevacizumab  1.25mg /0.58ml   Route: Intravitreal, Site: Left Eye   NDC: 49757-939-98, Lot: [id number]_bkkLOLmtNiasetAEyiwZvChyuPuudkuy$$MzfnczAzqnmzIZPI_bkkLOLmtNiasetAEyiwZvChyuPuudkuy$ , Expiration date: 08/24/2024   Post-op Post injection exam found visual acuity of at least counting fingers. The patient tolerated the procedure well. There were no complications. The patient received written and verbal post procedure care education.              ASSESSMENT/PLAN:    ICD-10-CM   1. Moderate nonproliferative diabetic retinopathy of both eyes with macular edema associated with type 2 diabetes mellitus (HCC)  E11.3313 OCT, Retina - OU - Both Eyes    Intravitreal Injection, Pharmacologic Agent - OD - Right Eye    Intravitreal Injection, Pharmacologic Agent - OS - Left Eye    Bevacizumab  (AVASTIN ) SOLN 1.25 mg    Bevacizumab  (AVASTIN ) SOLN 1.25 mg    2. CME (cystoid macular edema), right  H35.351     3. Essential hypertension  I10     4. Hypertensive  retinopathy of both eyes  H35.033     5. 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 Pseudophakia, both eyes  Z96.1       1. Moderate nonproliferative diabetic retinopathy OU  - pt lost to follow up from 4 weeks to 6 months (03.22.23 - 09.22.23)  - pt lost to follow up from 4 weeks to 19 months (09.22.23 - 05.07.25)  - pt lost to folllow up from 4 weeks to 3 months (05.07.25-08.13.25)  - s/p IVA OD #1 (05.07.25), #2 (08.13.25)  - s/p IVA OS #1 (09.22.23), #2 (05.07.25), #3 (08.13.25)  - +cystic changes/ DME OU - exam with scattered MA OU - FA (1.4.23) shows mild late leaking MA OU - OCT shows OD: Scattered persistent IRF / edema-- slightly improved,  partial PVD, OS: Persistent IRF / edema temporal fovea and macula --slightly increased at 4 weeks - recommend IVA OU (OD #3 and OS #4) today, (09.10.25) w/ f/u in 4 wks  - pt wishes to proceed with injections  - RBA of procedure discussed, questions answered - IVA informed consent obtained and signed, 05.07.25 (OU) - see procedure note - f/u in 4 wks -- DFE/OCT  2. CME OD - OCT shows OD: Scattered persistent IRF / edema with mild interval increase centrally, partial PVD  - BCVA OD 20/40 - FA (01.4.23) shows petaloid leakage consistent with CME OD + mild leaking MA OU  - okay to stay off PF and Prolensa  for now  - recommend IVA OD -- see above  - f/u 4 wks for DFE, OCT  3,4. Hypertensive retinopathy OU - discussed importance of tight BP control - monitor  5. Mixed Cataract OD - The symptoms of cataract, surgical options, and treatments and risks were discussed with patient. - discussed diagnosis and progression - monitor for now, but planning on getting re-established at [facility]  6. Pseudophakia OS  - s/p CE/IOL (Dr. KYM Gaudy)  - IOL in good position, doing well  - monitor   Ophthalmic Meds Ordered this visit:  Meds ordered this encounter  Medications   Bevacizumab  (AVASTIN ) SOLN 1.25 mg    Bevacizumab  (AVASTIN ) SOLN 1.25 mg     Return in about 4 weeks (around 08/30/2024) for f/u, NPDR, DFE, OCT, Possible, IVA, OU.  There are no Patient Instructions on file for this visit.   Explained the diagnoses, plan, and follow up with the patient and they expressed understanding.  Patient expressed understanding of the importance of proper follow up care.   This document serves as a record of services personally performed by Redell JUDITHANN Hans, MD, PhD. It was created on their behalf by Almetta Pesa, an ophthalmic technician. The creation of this record is the provider's dictation and/or activities during the visit.    Electronically signed by: Almetta Pesa, OA, 08/07/24  2:38 AM  This document serves as a record of services personally performed by Redell JUDITHANN Hans, MD, PhD. It was created on their behalf by Wanda GEANNIE Keens, COT an ophthalmic technician. The creation of this record is the provider's dictation and/or activities during the visit.    Electronically signed by:  Wanda GEANNIE Keens, COT  08/07/24 2:38 AM  Redell JUDITHANN Hans, M.D., Ph.D. Diseases & Surgery of the Retina and Vitreous [facility]  I have reviewed the above documentation for accuracy and completeness, and I agree with the above. Redell JUDITHANN Hans, M.D., Ph.D. 08/07/24 2:39 AM  Abbreviations: M myopia (nearsighted); A astigmatism; H hyperopia (farsighted); P presbyopia; Mrx spectacle prescription;  CTL contact lenses; OD right eye; OS left eye; OU both eyes  XT exotropia; ET esotropia; PEK punctate epithelial keratitis; PEE punctate epithelial erosions; DES dry eye syndrome; MGD meibomian gland dysfunction; ATs artificial tears; PFAT's preservative free artificial tears; NSC nuclear sclerotic cataract; PSC posterior subcapsular cataract; ERM epi-retinal membrane; PVD posterior vitreous detachment; RD retinal detachment; DM diabetes mellitus; DR diabetic retinopathy; NPDR  non-proliferative diabetic retinopathy; PDR proliferative diabetic retinopathy; CSME clinically significant macular edema; DME diabetic macular edema; dbh dot blot hemorrhages; CWS cotton wool spot; POAG primary open angle glaucoma; C/D cup-to-disc ratio; HVF humphrey visual field; GVF goldmann visual field; OCT optical coherence tomography; IOP intraocular pressure; BRVO Branch retinal vein occlusion; CRVO central retinal vein occlusion; CRAO central retinal artery occlusion; BRAO branch retinal artery occlusion; RT retinal tear; SB scleral buckle; PPV pars plana vitrectomy; VH Vitreous hemorrhage; PRP panretinal laser photocoagulation; IVK intravitreal kenalog; VMT vitreomacular traction; MH Macular hole;  NVD neovascularization of the disc; NVE neovascularization elsewhere; AREDS age related eye disease study; ARMD age related macular degeneration; POAG primary open angle glaucoma; EBMD epithelial/anterior basement membrane dystrophy; ACIOL anterior chamber intraocular lens; IOL intraocular lens; PCIOL posterior chamber intraocular lens; Phaco/IOL phacoemulsification with intraocular lens placement; PRK photorefractive keratectomy; LASIK laser assisted in situ keratomileusis; HTN hypertension; DM diabetes mellitus; COPD chronic obstructive pulmonary disease

## 2024-08-02 ENCOUNTER — Ambulatory Visit (INDEPENDENT_AMBULATORY_CARE_PROVIDER_SITE_OTHER): Payer: Medicare (Managed Care) | Admitting: Ophthalmology

## 2024-08-02 ENCOUNTER — Encounter (INDEPENDENT_AMBULATORY_CARE_PROVIDER_SITE_OTHER): Payer: Self-pay | Admitting: Ophthalmology

## 2024-08-02 DIAGNOSIS — I10 Essential (primary) hypertension: Secondary | ICD-10-CM

## 2024-08-02 DIAGNOSIS — H35033 Hypertensive retinopathy, bilateral: Secondary | ICD-10-CM | POA: Diagnosis not present

## 2024-08-02 DIAGNOSIS — Z961 Presence of intraocular lens: Secondary | ICD-10-CM

## 2024-08-02 DIAGNOSIS — H35351 Cystoid macular degeneration, right eye: Secondary | ICD-10-CM | POA: Diagnosis not present

## 2024-08-02 DIAGNOSIS — H25811 Combined forms of age-related cataract, right eye: Secondary | ICD-10-CM

## 2024-08-02 DIAGNOSIS — E113313 Type 2 diabetes mellitus with moderate nonproliferative diabetic retinopathy with macular edema, bilateral: Secondary | ICD-10-CM

## 2024-08-02 MED ORDER — BEVACIZUMAB CHEMO INJECTION 1.25MG/0.05ML SYRINGE FOR KALEIDOSCOPE
1.2500 mg | INTRAVITREAL | Status: AC | PRN
  Administered 2024-08-02: 1.25 mg via INTRAVITREAL

## 2024-08-24 NOTE — Progress Notes (Signed)
 [facility] - Clinic Note  08/30/2024     CHIEF COMPLAINT Patient presents for Retina Follow Up    HISTORY OF PRESENT ILLNESS: Christina Evans is a 63 y.o. female who presents to the clinic today for:   HPI     Retina Follow Up   Patient presents with  Diabetic Retinopathy.  In both eyes.  This started 4 weeks ago.  Duration of 4 weeks.  I, the attending physician,  performed the HPI with the patient and updated documentation appropriately.        Comments   4 week retina follow up NPDR OU and IVA OU pt is reporting vision is dark in os and can hardly see anything she has floaters denies any flashes pt last reading was 200 last night       Last edited by Valdemar Rogue, MD on 08/30/2024  5:11 PM.      Patient states she sees floaters. Two day ago she got dizzy from spinning around and vomited.   Referring physician: Cloria Annabella CROME, DO [address] [place],  [place] [address]  HISTORICAL INFORMATION:   Selected notes from the MEDICAL RECORD NUMBER Referred by Dr. Medford Gaudy for concern of cataract clearance / NPDR LEE:  Ocular Hx- PMH-    CURRENT MEDICATIONS: Current Outpatient Medications (Ophthalmic Drugs)  Medication Sig   Bromfenac  Sodium (PROLENSA ) 0.07 % SOLN Place 1 drop into both eyes 4 (four) times daily.   prednisoLONE  acetate (PRED FORTE ) 1 % ophthalmic suspension Place 1 drop into both eyes 4 (four) times daily.   No current facility-administered medications for this visit. (Ophthalmic Drugs)   Current Outpatient Medications (Other)  Medication Sig   acetaminophen  (TYLENOL ) 500 MG tablet Take 500 mg by mouth every 6 (six) hours as needed for moderate pain or mild pain.   albuterol  (PROAIR  HFA) 108 (90 Base) MCG/ACT inhaler Inhale 2 puffs into the lungs 4 (four) times daily as needed for wheezing or shortness of breath.   atorvastatin  (LIPITOR ) 80 MG tablet TAKE 1 TABLET BY MOUTH ONCE DAILY.   benzonatate  (TESSALON ) 100 MG  capsule Take 1 capsule (100 mg total) by mouth 3 (three) times daily as needed for cough.   blood glucose meter kit and supplies Dispense based on patient and insurance preference. Use up to four times daily as directed. (FOR ICD-9 250.00, 250.01).   Cholecalciferol (VITAMIN D3) 50 MCG (2000 UT) TABS Take 2,000 Units by mouth daily.   citalopram  (CELEXA ) 40 MG tablet TAKE 1 TABLET BY MOUTH ONCE DAILY.   clopidogrel  (PLAVIX ) 75 MG tablet Take 1 tablet (75 mg total) by mouth daily.   diclofenac Sodium (VOLTAREN) 1 % GEL Apply 2 g topically.   glucose blood test strip Use as instructed   Lancets (ACCU-CHEK SOFT TOUCH) lancets Use as instructed   melatonin 5 MG TABS Take 5 mg by mouth at bedtime.   metFORMIN  (GLUCOPHAGE -XR) 500 MG 24 hr tablet Take 2 tablets (1,000 mg total) by mouth 2 (two) times daily.   tamsulosin (FLOMAX) 0.4 MG CAPS capsule Take 0.4 mg by mouth daily.   trimethoprim (TRIMPEX) 100 MG tablet Take 100 mg by mouth daily.   No current facility-administered medications for this visit. (Other)   REVIEW OF SYSTEMS: ROS   Positive for: Neurological, Musculoskeletal, Endocrine, Eyes, Respiratory, Psychiatric Negative for: Constitutional, Gastrointestinal, Skin, Genitourinary, HENT, Cardiovascular, Allergic/Imm, Heme/Lymph Last edited by Resa Delon ORN, COT on 08/30/2024  2:40 PM.  ALLERGIES Allergies  Allergen Reactions   Tape Itching and Other (See Comments)    Causes a lot of scratching, also   PAST MEDICAL HISTORY Past Medical History:  Diagnosis Date   Asthma    CVA (cerebral vascular accident) (HCC)    2008 and 2020; left spastic hemiplegia   Dementia (HCC)    Diabetes mellitus without complication (HCC)    Past Surgical History:  Procedure Laterality Date   IR ANGIO INTRA EXTRACRAN SEL COM CAROTID INNOMINATE BILAT MOD SED  09/15/2019   IR ANGIO VERTEBRAL SEL VERTEBRAL UNI L MOD SED  09/15/2019   ORIF ANKLE FRACTURE Left 02/03/2022   Procedure: OPEN  TREATMENT OF LEFT BIMALLEOLAR ANKLE FRACTURE;  Surgeon: Elsa Lonni SAUNDERS, MD;  Location: [facility] OR;  Service: Orthopedics;  Laterality: Left;  LENGTH OF SURGERY: 90 MINUTES POSITION: SUPINE   SYNDESMOSIS REPAIR Left 02/03/2022   Procedure: SYNDESMOSIS;  Surgeon: Elsa Lonni SAUNDERS, MD;  Location: [facility] OR;  Service: Orthopedics;  Laterality: Left;  LENGTH OF SURGERY: 90 MINUTES POSITION: SUPINE   FAMILY HISTORY Family History  Problem Relation Age of Onset   Hypertension Mother    Hypertension Father    SOCIAL HISTORY Social History   Tobacco Use   Smoking status: Former   Smokeless tobacco: Never   Tobacco comments:    QUIT IN 2008  Vaping Use   Vaping status: Never Used  Substance Use Topics   Alcohol use: Yes    Comment: RARE   Drug use: No       OPHTHALMIC EXAM:  Base Eye Exam     Visual Acuity (Snellen - Linear)       Right Left   Dist [place] 20/50 -1 20/70 -2   Dist ph [place] NI NI         Tonometry (Tonopen, 2:50 PM)       Right Left   Pressure 8 8         Pupils       Pupils Dark Light Shape React APD   Right PERRL 3 2 Round Brisk None   Left PERRL 3 2 Round Brisk None         Visual Fields       Left Right    Full Full         Extraocular Movement       Right Left    Full, Ortho Full, Ortho         Neuro/Psych     Oriented x3: Yes   Mood/Affect: Normal           Slit Lamp and Fundus Exam     Slit Lamp Exam       Right Left   Lids/Lashes Dermatochalasis - upper lid, mild MGD Dermatochalasis - upper lid, mild MGD   Conjunctiva/Sclera temporal pinguecula nasal and temporal pinguecula   Cornea Clear well healed cataract wound   Anterior Chamber deep and clear deep and clear   Iris Round and dilated, No NVI Round and dilated, No NVI   Lens 2+ Nuclear sclerosis, 3+ Cortical cataract, 2+Posterior subcapsular cataract PC IOL in good position   Anterior Vitreous Vitreous syneresis Vitreous syneresis, blood stained Vitreous  condensations         Fundus Exam       Right Left   Disc Trace Pallor, Sharp rim Pink and Sharp, mild PPA   C/D Ratio 0.6 0.5   Macula Flat, Blunted foveal reflex, scattered MA and cystic changes-- slightly improved  blunted foveal reflex, scattered MA/ DBH, cystic changes / edema temporal macula +CWS- improved   Vessels attenuated, mild tortuosity attenuated, Tortuous   Periphery Attached, scattered MA Attached, scattered MA, mild pre retinal hemes, Retinal detachment            IMAGING AND PROCEDURES  Imaging and Procedures for 08/30/2024  OCT, Retina - OU - Both Eyes       Right Eye Quality was good. Central Foveal Thickness: 343. Progression has improved. Findings include no SRF, abnormal foveal contour, intraretinal hyper-reflective material, intraretinal fluid, vitreomacular adhesion (Scattered persistent IRF / edema-- slightly improved, partial PVD).   Left Eye Quality was good. Central Foveal Thickness: 371. Progression has worsened. Findings include no SRF, abnormal foveal contour, retinal drusen , intraretinal hyper-reflective material, intraretinal fluid (Interval development of vitreous opacities, persistent IRF / edema temporal fovea and macula ).   Notes *Images captured and stored on drive  Diagnosis / Impression:  OD: Scattered persistent IRF / edema-- slightly improved, partial PVD OS: Interval development of vitreous opacities, persistent IRF / edema temporal fovea and macula   Clinical management:  See below  Abbreviations: NFP - Normal foveal profile. CME - cystoid macular edema. PED - pigment epithelial detachment. IRF - intraretinal fluid. SRF - subretinal fluid. EZ - ellipsoid zone. ERM - epiretinal membrane. ORA - outer retinal atrophy. ORT - outer retinal tubulation. SRHM - subretinal hyper-reflective material. IRHM - intraretinal hyper-reflective material      Intravitreal Injection, Pharmacologic Agent - OD - Right Eye       Time  Out 08/30/2024. 3:14 PM. Confirmed correct patient, procedure, site, and patient consented.   Anesthesia Topical anesthesia was used. Anesthetic medications included Lidocaine  2%, Proparacaine 0.5%.   Procedure Preparation included 5% betadine to ocular surface, eyelid speculum. A (32g) needle was used.   Injection: 1.25 mg Bevacizumab  1.25mg /0.56ml   Route: Intravitreal, Site: Right Eye   NDC: [id number], Lot: [id number], Expiration date: 12/02/2024   Post-op Post injection exam found visual acuity of at least counting fingers. The patient tolerated the procedure well. There were no complications. The patient received written and verbal post procedure care education.      Intravitreal Injection, Pharmacologic Agent - OS - Left Eye       Time Out 08/30/2024. 3:14 PM. Confirmed correct patient, procedure, site, and patient consented.   Anesthesia Topical anesthesia was used. Anesthetic medications included Lidocaine  2%, Proparacaine 0.5%.   Procedure Preparation included 5% betadine to ocular surface, eyelid speculum. A supplied (32g) needle was used.   Injection: 1.25 mg Bevacizumab  1.25mg /0.52ml   Route: Intravitreal, Site: Left Eye   NDC: [id number], Lot: [id number], Expiration date: 09/17/2024   Post-op Post injection exam found visual acuity of at least counting fingers. The patient tolerated the procedure well. There were no complications. The patient received written and verbal post procedure care education.            ASSESSMENT/PLAN:    ICD-10-CM   1. Moderate nonproliferative diabetic retinopathy of both eyes with macular edema associated with type 2 diabetes mellitus (HCC)  E11.3313 OCT, Retina - OU - Both Eyes    Intravitreal Injection, Pharmacologic Agent - OD - Right Eye    Intravitreal Injection, Pharmacologic Agent - OS - Left Eye    Bevacizumab  (AVASTIN ) SOLN 1.25 mg    Bevacizumab  (AVASTIN ) SOLN 1.25 mg    2. CME (cystoid macular edema), right  H35.351      3. Essential hypertension  I10  4. Hypertensive retinopathy of both eyes  H35.033     5. 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 Pseudophakia, both eyes  Z96.1      1. Moderate nonproliferative diabetic retinopathy OU - pt lost to follow up from 4 weeks to 6 months (03.22.23 - 09.22.23) - pt lost to follow up from 4 weeks to 19 months (09.22.23 - 05.07.25) - pt lost to folllow up from 4 weeks to 3 months (05.07.25-08.13.25)  - s/p IVA OD #1 (05.07.25), #2 (08.13.25), #3 (09.10.25) - s/p IVA OS #1 (09.22.23), #2 (05.07.25), #3 (08.13.25), #4 (09.10.25)  - +cystic changes/ DME OU - exam with scattered MA OU - FA (1.4.23) shows mild late leaking MA OU - OCT shows OD: Scattered persistent IRF / edema-- slightly improved, partial PVD, OS: Interval development of vitreous opacities, persistent IRF / edema temporal fovea and macula at 4 weeks - recommend IVA OU (OD #4 and OS #5) today, (10.08.25) w/ f/u in 4 wks  - pt wishes to proceed with injections  - RBA of procedure discussed, questions answered - IVA informed consent obtained and signed, 05.07.25 (OU) - see procedure note - f/u in 4 wks -- DFE/OCT  2. CME OD - OCT shows OD: Scattered persistent IRF / edema -- slightly improved; partial PVD  - BCVA OD 20/50 from 20/40 - FA (01.4.23) shows petaloid leakage consistent with CME OD + mild leaking MA OU  - okay to stay off PF and Prolensa  for now  - recommend IVA OD -- see above  - f/u 4 wks for DFE, OCT  3,4. Hypertensive retinopathy OU - discussed importance of tight BP control - monitor  5. Mixed Cataract OD - The symptoms of cataract, surgical options, and treatments and risks were discussed with patient. - discussed diagnosis and progression - monitor for now, but planning on getting re-established at [facility]  6. Pseudophakia OS  - s/p CE/IOL (Dr. KYM Gaudy)  - IOL in good position, doing well  - monitor   Ophthalmic Meds  Ordered this visit:  Meds ordered this encounter  Medications   Bevacizumab  (AVASTIN ) SOLN 1.25 mg   Bevacizumab  (AVASTIN ) SOLN 1.25 mg     Return in about 4 weeks (around 09/27/2024) for f/u, NPDR, DFE, OCT, Possible, IVA, OU.  There are no Patient Instructions on file for this visit.   Explained the diagnoses, plan, and follow up with the patient and they expressed understanding.  Patient expressed understanding of the importance of proper follow up care.   This document serves as a record of services personally performed by Redell JUDITHANN Hans, MD, PhD. It was created on their behalf by Almetta Pesa, an ophthalmic technician. The creation of this record is the provider's dictation and/or activities during the visit.    Electronically signed by: Almetta Pesa, OA, 09/03/24  5:08 PM  This document serves as a record of services personally performed by Redell JUDITHANN Hans, MD, PhD. It was created on their behalf by Wanda GEANNIE Keens, COT an ophthalmic technician. The creation of this record is the provider's dictation and/or activities during the visit.    Electronically signed by:  Wanda GEANNIE Keens, COT  09/03/24 5:08 PM  Redell JUDITHANN Hans, M.D., Ph.D. Diseases & Surgery of the Retina and Vitreous [facility]  I have reviewed the above documentation for accuracy and completeness, and I agree with the above. Redell JUDITHANN Hans, M.D., Ph.D.  09/03/24 5:10 PM   Abbreviations: M myopia (nearsighted); A astigmatism; H hyperopia (farsighted); P presbyopia; Mrx spectacle prescription;  CTL contact lenses; OD right eye; OS left eye; OU both eyes  XT exotropia; ET esotropia; PEK punctate epithelial keratitis; PEE punctate epithelial erosions; DES dry eye syndrome; MGD meibomian gland dysfunction; ATs artificial tears; PFAT's preservative free artificial tears; NSC nuclear sclerotic cataract; PSC posterior subcapsular cataract; ERM epi-retinal membrane; PVD posterior vitreous  detachment; RD retinal detachment; DM diabetes mellitus; DR diabetic retinopathy; NPDR non-proliferative diabetic retinopathy; PDR proliferative diabetic retinopathy; CSME clinically significant macular edema; DME diabetic macular edema; dbh dot blot hemorrhages; CWS cotton wool spot; POAG primary open angle glaucoma; C/D cup-to-disc ratio; HVF humphrey visual field; GVF goldmann visual field; OCT optical coherence tomography; IOP intraocular pressure; BRVO Branch retinal vein occlusion; CRVO central retinal vein occlusion; CRAO central retinal artery occlusion; BRAO branch retinal artery occlusion; RT retinal tear; SB scleral buckle; PPV pars plana vitrectomy; VH Vitreous hemorrhage; PRP panretinal laser photocoagulation; IVK intravitreal kenalog; VMT vitreomacular traction; MH Macular hole;  NVD neovascularization of the disc; NVE neovascularization elsewhere; AREDS age related eye disease study; ARMD age related macular degeneration; POAG primary open angle glaucoma; EBMD epithelial/anterior basement membrane dystrophy; ACIOL anterior chamber intraocular lens; IOL intraocular lens; PCIOL posterior chamber intraocular lens; Phaco/IOL phacoemulsification with intraocular lens placement; PRK photorefractive keratectomy; LASIK laser assisted in situ keratomileusis; HTN hypertension; DM diabetes mellitus; COPD chronic obstructive pulmonary disease

## 2024-08-30 ENCOUNTER — Encounter (INDEPENDENT_AMBULATORY_CARE_PROVIDER_SITE_OTHER): Payer: Self-pay | Admitting: Ophthalmology

## 2024-08-30 ENCOUNTER — Ambulatory Visit (INDEPENDENT_AMBULATORY_CARE_PROVIDER_SITE_OTHER): Payer: Medicare (Managed Care) | Admitting: Ophthalmology

## 2024-08-30 DIAGNOSIS — I10 Essential (primary) hypertension: Secondary | ICD-10-CM

## 2024-08-30 DIAGNOSIS — H35351 Cystoid macular degeneration, right eye: Secondary | ICD-10-CM

## 2024-08-30 DIAGNOSIS — E113313 Type 2 diabetes mellitus with moderate nonproliferative diabetic retinopathy with macular edema, bilateral: Secondary | ICD-10-CM

## 2024-08-30 DIAGNOSIS — H35033 Hypertensive retinopathy, bilateral: Secondary | ICD-10-CM

## 2024-08-30 DIAGNOSIS — Z961 Presence of intraocular lens: Secondary | ICD-10-CM

## 2024-08-30 DIAGNOSIS — H25811 Combined forms of age-related cataract, right eye: Secondary | ICD-10-CM

## 2024-08-30 MED ORDER — BEVACIZUMAB CHEMO INJECTION 1.25MG/0.05ML SYRINGE FOR KALEIDOSCOPE
1.2500 mg | INTRAVITREAL | Status: AC | PRN
  Administered 2024-08-30: 1.25 mg via INTRAVITREAL

## 2024-09-21 NOTE — Progress Notes (Signed)
 [facility] - Clinic Note  09/27/2024     CHIEF COMPLAINT Patient presents for Retina Follow Up    HISTORY OF PRESENT ILLNESS: Christina Evans is a 63 y.o. female who presents to the clinic today for:   HPI     Retina Follow Up   Patient presents with  Diabetic Retinopathy.  In both eyes.  This started 4 weeks ago.  Duration of 4 weeks.  I, the attending physician,  performed the HPI with the patient and updated documentation appropriately.        Comments   Pt states vision is the same, no FOL/floaters/pain. Pt states she is given ATS very day.      Last edited by Valdemar Rogue, MD on 09/28/2024  2:56 PM.     Patient states her one daughter moved to [place]. She still lives with another daughter, [name]. Pt states her eyes are watering a lot. Pt is at a nursing home currently, will be moving home 11/26.   Referring physician: Cloria Annabella CROME, DO [address] [place],  [place] [address]  HISTORICAL INFORMATION:   Selected notes from the MEDICAL RECORD NUMBER Referred by Dr. Medford Gaudy for concern of cataract clearance / NPDR LEE:  Ocular Hx- PMH-    CURRENT MEDICATIONS: Current Outpatient Medications (Ophthalmic Drugs)  Medication Sig   Bromfenac  Sodium (PROLENSA ) 0.07 % SOLN Place 1 drop into both eyes 4 (four) times daily.   prednisoLONE  acetate (PRED FORTE ) 1 % ophthalmic suspension Place 1 drop into both eyes 4 (four) times daily.   No current facility-administered medications for this visit. (Ophthalmic Drugs)   Current Outpatient Medications (Other)  Medication Sig   acetaminophen  (TYLENOL ) 500 MG tablet Take 500 mg by mouth every 6 (six) hours as needed for moderate pain or mild pain.   albuterol  (PROAIR  HFA) 108 (90 Base) MCG/ACT inhaler Inhale 2 puffs into the lungs 4 (four) times daily as needed for wheezing or shortness of breath.   atorvastatin  (LIPITOR ) 80 MG tablet TAKE 1 TABLET BY MOUTH ONCE DAILY.   benzonatate   (TESSALON ) 100 MG capsule Take 1 capsule (100 mg total) by mouth 3 (three) times daily as needed for cough.   blood glucose meter kit and supplies Dispense based on patient and insurance preference. Use up to four times daily as directed. (FOR ICD-9 250.00, 250.01).   Cholecalciferol (VITAMIN D3) 50 MCG (2000 UT) TABS Take 2,000 Units by mouth daily.   citalopram  (CELEXA ) 40 MG tablet TAKE 1 TABLET BY MOUTH ONCE DAILY.   clopidogrel  (PLAVIX ) 75 MG tablet Take 1 tablet (75 mg total) by mouth daily.   diclofenac Sodium (VOLTAREN) 1 % GEL Apply 2 g topically.   glucose blood test strip Use as instructed   Lancets (ACCU-CHEK SOFT TOUCH) lancets Use as instructed   melatonin 5 MG TABS Take 5 mg by mouth at bedtime.   metFORMIN  (GLUCOPHAGE -XR) 500 MG 24 hr tablet Take 2 tablets (1,000 mg total) by mouth 2 (two) times daily.   tamsulosin (FLOMAX) 0.4 MG CAPS capsule Take 0.4 mg by mouth daily.   trimethoprim (TRIMPEX) 100 MG tablet Take 100 mg by mouth daily.   No current facility-administered medications for this visit. (Other)   REVIEW OF SYSTEMS: ROS   Positive for: Neurological, Musculoskeletal, Endocrine, Eyes, Respiratory, Psychiatric Negative for: Constitutional, Gastrointestinal, Skin, Genitourinary, HENT, Cardiovascular, Allergic/Imm, Heme/Lymph Last edited by Elnor Avelina RAMAN, COT on 09/27/2024  2:56 PM.  ALLERGIES Allergies  Allergen Reactions   Tape Itching and Other (See Comments)    Causes a lot of scratching, also   PAST MEDICAL HISTORY Past Medical History:  Diagnosis Date   Asthma    CVA (cerebral vascular accident) (HCC)    2008 and 2020; left spastic hemiplegia   Dementia (HCC)    Diabetes mellitus without complication (HCC)    Past Surgical History:  Procedure Laterality Date   IR ANGIO INTRA EXTRACRAN SEL COM CAROTID INNOMINATE BILAT MOD SED  09/15/2019   IR ANGIO VERTEBRAL SEL VERTEBRAL UNI L MOD SED  09/15/2019   ORIF ANKLE FRACTURE Left 02/03/2022    Procedure: OPEN TREATMENT OF LEFT BIMALLEOLAR ANKLE FRACTURE;  Surgeon: Elsa Lonni SAUNDERS, MD;  Location: [facility] OR;  Service: Orthopedics;  Laterality: Left;  LENGTH OF SURGERY: 90 MINUTES POSITION: SUPINE   SYNDESMOSIS REPAIR Left 02/03/2022   Procedure: SYNDESMOSIS;  Surgeon: Elsa Lonni SAUNDERS, MD;  Location: [facility] OR;  Service: Orthopedics;  Laterality: Left;  LENGTH OF SURGERY: 90 MINUTES POSITION: SUPINE   FAMILY HISTORY Family History  Problem Relation Age of Onset   Hypertension Mother    Hypertension Father    SOCIAL HISTORY Social History   Tobacco Use   Smoking status: Former   Smokeless tobacco: Never   Tobacco comments:    QUIT IN 2008  Vaping Use   Vaping status: Never Used  Substance Use Topics   Alcohol use: Yes    Comment: RARE   Drug use: No       OPHTHALMIC EXAM:  Base Eye Exam     Visual Acuity (Snellen - Linear)       Right Left   Dist [place] 20/80 20/70 -2   Dist ph [place] 20/40 -1 20/40         Tonometry (Tonopen, 2:50 PM)       Right Left   Pressure 21 14         Pupils       Pupils Dark Light Shape React APD   Right PERRL 3 2 Round Brisk None   Left PERRL 3 2 Round Brisk None         Visual Fields       Left Right    Full Full         Extraocular Movement       Right Left    Full, Ortho Full, Ortho         Neuro/Psych     Oriented x3: Yes   Mood/Affect: Normal         Dilation     Both eyes: 1.0% Mydriacyl, 2.5% Phenylephrine  @ 2:52 PM           Slit Lamp and Fundus Exam     Slit Lamp Exam       Right Left   Lids/Lashes Dermatochalasis - upper lid, mild MGD Dermatochalasis - upper lid, mild MGD   Conjunctiva/Sclera temporal pinguecula nasal and temporal pinguecula   Cornea Clear well healed cataract wound   Anterior Chamber deep and clear deep and clear   Iris Round and dilated, No NVI Round and dilated, No NVI   Lens 2+ Nuclear sclerosis, 3+ Cortical cataract, 2+Posterior subcapsular cataract PC IOL  in good position   Anterior Vitreous Vitreous syneresis Vitreous syneresis, blood stained Vitreous condensations         Fundus Exam       Right Left   Disc Trace Pallor, Sharp rim Pink and Sharp,  mild PPA   C/D Ratio 0.6 0.5   Macula Flat, Blunted foveal reflex, scattered MA and cystic changes blunted foveal reflex, scattered MA/ DBH, cystic changes / edema temporal macula, +CWS- stably improved   Vessels attenuated, mild tortuosity attenuated, Tortuous   Periphery Attached, scattered MA Attached, scattered MA, mild pre retinal hemes, Retinal detachment            IMAGING AND PROCEDURES  Imaging and Procedures for 09/27/2024  OCT, Retina - OU - Both Eyes       Right Eye Quality was good. Central Foveal Thickness: 388. Progression has worsened. Findings include no SRF, abnormal foveal contour, intraretinal hyper-reflective material, intraretinal fluid, vitreomacular adhesion (Scattered persistent IRF / edema-- increased centrally, central SRF improved, partial PVD).   Left Eye Quality was good. Central Foveal Thickness: 477. Progression has worsened. Findings include no SRF, abnormal foveal contour, retinal drusen , intraretinal hyper-reflective material, intraretinal fluid (Interval improvement of vitreous opacities, persistent IRF / edema temporal fovea and macula--increased centrally).   Notes *Images captured and stored on drive  Diagnosis / Impression:  OD: Scattered persistent IRF / edema-- increased centrally, central SRF improved, partial PVD OS: Interval improvement of vitreous opacities, persistent IRF / edema temporal fovea and macula--increased centrally  Clinical management:  See below  Abbreviations: NFP - Normal foveal profile. CME - cystoid macular edema. PED - pigment epithelial detachment. IRF - intraretinal fluid. SRF - subretinal fluid. EZ - ellipsoid zone. ERM - epiretinal membrane. ORA - outer retinal atrophy. ORT - outer retinal tubulation. SRHM -  subretinal hyper-reflective material. IRHM - intraretinal hyper-reflective material      Intravitreal Injection, Pharmacologic Agent - OD - Right Eye       Time Out 09/27/2024. 3:19 PM. Confirmed correct patient, procedure, site, and patient consented.   Anesthesia Topical anesthesia was used. Anesthetic medications included Lidocaine  2%, Proparacaine 0.5%.   Procedure Preparation included 5% betadine to ocular surface, eyelid speculum. A (32g) needle was used.   Injection: 1.25 mg Bevacizumab  1.25mg /0.32ml   Route: Intravitreal, Site: Right Eye   NDC: 49757-939-98, Lot: [id number], Expiration date: 10/15/2024   Post-op Post injection exam found visual acuity of at least counting fingers. The patient tolerated the procedure well. There were no complications. The patient received written and verbal post procedure care education.      Intravitreal Injection, Pharmacologic Agent - OS - Left Eye       Time Out 09/27/2024. 3:19 PM. Confirmed correct patient, procedure, site, and patient consented.   Anesthesia Topical anesthesia was used. Anesthetic medications included Lidocaine  2%, Proparacaine 0.5%.   Procedure Preparation included 5% betadine to ocular surface, eyelid speculum. A supplied (32g) needle was used.   Injection: 1.25 mg Bevacizumab  1.25mg /0.70ml   Route: Intravitreal, Site: Left Eye   NDC: C2662926, Lot: [id number]$ , Expiration date: 10/19/2024   Post-op Post injection exam found visual acuity of at least counting fingers. The patient tolerated the procedure well. There were no complications. The patient received written and verbal post procedure care education.             ASSESSMENT/PLAN:    ICD-10-CM   1. Moderate nonproliferative diabetic retinopathy of both eyes with macular edema associated with type 2 diabetes mellitus (HCC)  E11.3313 OCT, Retina - OU - Both Eyes    Intravitreal Injection, Pharmacologic Agent - OD - Right Eye    Intravitreal  Injection, Pharmacologic Agent - OS - Left Eye    Bevacizumab  (AVASTIN ) SOLN 1.25 mg  Bevacizumab  (AVASTIN ) SOLN 1.25 mg    2. CME (cystoid macular edema), right  H35.351     3. Essential hypertension  I10     4. Hypertensive retinopathy of both eyes  H35.033     5. 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 Pseudophakia, both eyes  Z96.1      1. Moderate nonproliferative diabetic retinopathy OU - pt lost to follow up from 4 weeks to 6 months (03.22.23 - 09.22.23) - pt lost to follow up from 4 weeks to 19 months (09.22.23 - 05.07.25) - pt lost to folllow up from 4 weeks to 3 months (05.07.25-08.13.25)  - s/p IVA OD #1 (05.07.25), #2 (08.13.25), #3 (09.10.25), #4 (10.08.25) - s/p IVA OS #1 (09.22.23), #2 (05.07.25), #3 (08.13.25), #4 (09.10.25), #5 (10.08.25)  - +cystic changes/ DME OU - exam with scattered MA OU - FA (1.4.23) shows mild late leaking MA OU - OCT shows OD: Scattered persistent IRF / edema-- increased centrally, central SRF improved, partial PVD; OS: Interval improvement of vitreous opacities, persistent IRF / edema temporal fovea and macula--increased centrally 4 weeks - recommend IVA OU (OD #5 and OS #6) today, (10.08.25) w/ f/u in 4 wks  - pt wishes to proceed with injections  - RBA of procedure discussed, questions answered - IVA informed consent obtained and signed, 05.07.25 (OU) - see procedure note - f/u in 4 wks -- DFE/OCT  2. CME OD - OCT shows OD: Scattered persistent IRF / edema -- slightly improved; partial PVD  - BCVA OD 20/40--stable - FA (01.4.23) shows petaloid leakage consistent with CME OD + mild leaking MA OU  - okay to stay off PF and Prolensa  for now  - recommend IVA OD -- see above  - f/u 4 wks for DFE, OCT  3,4. Hypertensive retinopathy OU - discussed importance of tight BP control - monitor  5. Mixed Cataract OD - The symptoms of cataract, surgical options, and treatments and risks were discussed with  patient. - discussed diagnosis and progression - pt planning on getting [facility] at [facility]  6. Pseudophakia OS  - s/p CE/IOL OS (Dr. KYM Gaudy)  - IOL in good position, doing well  - monitor   Ophthalmic Meds Ordered this visit:  Meds ordered this encounter  Medications   Bevacizumab  (AVASTIN ) SOLN 1.25 mg   Bevacizumab  (AVASTIN ) SOLN 1.25 mg     Return in about 4 weeks (around 10/25/2024) for NPDR OU, DFE, OCT.  There are no Patient Instructions on file for this visit.   Explained the diagnoses, plan, and follow up with the patient and they expressed understanding.  Patient expressed understanding of the importance of proper follow up care.   This document serves as a record of services personally performed by Redell JUDITHANN Hans, MD, PhD. It was created on their behalf by Almetta Pesa, an ophthalmic technician. The creation of this record is the provider's dictation and/or activities during the visit.    Electronically signed by: Almetta Pesa, OA, 09/28/24  2:59 PM  This document serves as a record of services personally performed by Redell JUDITHANN Hans, MD, PhD. It was created on their behalf by Wanda GEANNIE Keens, COT an ophthalmic technician. The creation of this record is the provider's dictation and/or activities during the visit.    Electronically signed by:  Wanda GEANNIE Keens, COT  09/28/24 2:59 PM   Redell JUDITHANN Hans, M.D., Ph.D. Diseases & Surgery of the Retina  and Vitreous [facility]  I have reviewed the above documentation for accuracy and completeness, and I agree with the above. Redell JUDITHANN Hans, M.D., Ph.D. 09/28/24 3:00 PM   Abbreviations: M myopia (nearsighted); A astigmatism; H hyperopia (farsighted); P presbyopia; Mrx spectacle prescription;  CTL contact lenses; OD right eye; OS left eye; OU both eyes  XT exotropia; ET esotropia; PEK punctate epithelial keratitis; PEE punctate epithelial erosions; DES dry eye syndrome;  MGD meibomian gland dysfunction; ATs artificial tears; PFAT's preservative free artificial tears; NSC nuclear sclerotic cataract; PSC posterior subcapsular cataract; ERM epi-retinal membrane; PVD posterior vitreous detachment; RD retinal detachment; DM diabetes mellitus; DR diabetic retinopathy; NPDR non-proliferative diabetic retinopathy; PDR proliferative diabetic retinopathy; CSME clinically significant macular edema; DME diabetic macular edema; dbh dot blot hemorrhages; CWS cotton wool spot; POAG primary open angle glaucoma; C/D cup-to-disc ratio; HVF humphrey visual field; GVF goldmann visual field; OCT optical coherence tomography; IOP intraocular pressure; BRVO Branch retinal vein occlusion; CRVO central retinal vein occlusion; CRAO central retinal artery occlusion; BRAO branch retinal artery occlusion; RT retinal tear; SB scleral buckle; PPV pars plana vitrectomy; VH Vitreous hemorrhage; PRP panretinal laser photocoagulation; IVK intravitreal kenalog; VMT vitreomacular traction; MH Macular hole;  NVD neovascularization of the disc; NVE neovascularization elsewhere; AREDS age related eye disease study; ARMD age related macular degeneration; POAG primary open angle glaucoma; EBMD epithelial/anterior basement membrane dystrophy; ACIOL anterior chamber intraocular lens; IOL intraocular lens; PCIOL posterior chamber intraocular lens; Phaco/IOL phacoemulsification with intraocular lens placement; PRK photorefractive keratectomy; LASIK laser assisted in situ keratomileusis; HTN hypertension; DM diabetes mellitus; COPD chronic obstructive pulmonary disease

## 2024-09-27 ENCOUNTER — Encounter (INDEPENDENT_AMBULATORY_CARE_PROVIDER_SITE_OTHER): Payer: Self-pay | Admitting: Ophthalmology

## 2024-09-27 ENCOUNTER — Ambulatory Visit (INDEPENDENT_AMBULATORY_CARE_PROVIDER_SITE_OTHER): Payer: Medicare (Managed Care) | Admitting: Ophthalmology

## 2024-09-27 DIAGNOSIS — H35351 Cystoid macular degeneration, right eye: Secondary | ICD-10-CM | POA: Diagnosis not present

## 2024-09-27 DIAGNOSIS — I10 Essential (primary) hypertension: Secondary | ICD-10-CM

## 2024-09-27 DIAGNOSIS — E113313 Type 2 diabetes mellitus with moderate nonproliferative diabetic retinopathy with macular edema, bilateral: Secondary | ICD-10-CM

## 2024-09-27 DIAGNOSIS — H25811 Combined forms of age-related cataract, right eye: Secondary | ICD-10-CM

## 2024-09-27 DIAGNOSIS — H35033 Hypertensive retinopathy, bilateral: Secondary | ICD-10-CM

## 2024-09-27 DIAGNOSIS — Z961 Presence of intraocular lens: Secondary | ICD-10-CM

## 2024-09-28 ENCOUNTER — Encounter (INDEPENDENT_AMBULATORY_CARE_PROVIDER_SITE_OTHER): Payer: Self-pay | Admitting: Ophthalmology

## 2024-09-28 MED ORDER — BEVACIZUMAB CHEMO INJECTION 1.25MG/0.05ML SYRINGE FOR KALEIDOSCOPE
1.2500 mg | INTRAVITREAL | Status: AC | PRN
  Administered 2024-09-28: 1.25 mg via INTRAVITREAL

## 2024-10-18 NOTE — Progress Notes (Shared)
 [facility] - Clinic Note  10/25/2024     CHIEF COMPLAINT Patient presents for No chief complaint on file.    HISTORY OF PRESENT ILLNESS: Christina Evans is a 63 y.o. female who presents to the clinic today for:     Patient states her one daughter moved to [place]. She still lives with another daughter, [name]. Pt states her eyes are watering a lot. Pt is at a nursing home currently, will be moving home 11/26.   Referring physician: Cloria Annabella CROME, DO [address] [place],  [place] [address]  HISTORICAL INFORMATION:   Selected notes from the MEDICAL RECORD NUMBER Referred by Dr. Medford Gaudy for concern of cataract clearance / NPDR LEE:  Ocular Hx- PMH-    CURRENT MEDICATIONS: Current Outpatient Medications (Ophthalmic Drugs)  Medication Sig   Bromfenac  Sodium (PROLENSA ) 0.07 % SOLN Place 1 drop into both eyes 4 (four) times daily.   prednisoLONE  acetate (PRED FORTE ) 1 % ophthalmic suspension Place 1 drop into both eyes 4 (four) times daily.   No current facility-administered medications for this visit. (Ophthalmic Drugs)   Current Outpatient Medications (Other)  Medication Sig   acetaminophen  (TYLENOL ) 500 MG tablet Take 500 mg by mouth every 6 (six) hours as needed for moderate pain or mild pain.   albuterol  (PROAIR  HFA) 108 (90 Base) MCG/ACT inhaler Inhale 2 puffs into the lungs 4 (four) times daily as needed for wheezing or shortness of breath.   atorvastatin  (LIPITOR ) 80 MG tablet TAKE 1 TABLET BY MOUTH ONCE DAILY.   benzonatate  (TESSALON ) 100 MG capsule Take 1 capsule (100 mg total) by mouth 3 (three) times daily as needed for cough.   blood glucose meter kit and supplies Dispense based on patient and insurance preference. Use up to four times daily as directed. (FOR ICD-9 250.00, 250.01).   Cholecalciferol (VITAMIN D3) 50 MCG (2000 UT) TABS Take 2,000 Units by mouth daily.   citalopram  (CELEXA ) 40 MG tablet TAKE 1 TABLET BY MOUTH ONCE  DAILY.   clopidogrel  (PLAVIX ) 75 MG tablet Take 1 tablet (75 mg total) by mouth daily.   diclofenac Sodium (VOLTAREN) 1 % GEL Apply 2 g topically.   glucose blood test strip Use as instructed   Lancets (ACCU-CHEK SOFT TOUCH) lancets Use as instructed   melatonin 5 MG TABS Take 5 mg by mouth at bedtime.   metFORMIN  (GLUCOPHAGE -XR) 500 MG 24 hr tablet Take 2 tablets (1,000 mg total) by mouth 2 (two) times daily.   tamsulosin (FLOMAX) 0.4 MG CAPS capsule Take 0.4 mg by mouth daily.   trimethoprim (TRIMPEX) 100 MG tablet Take 100 mg by mouth daily.   No current facility-administered medications for this visit. (Other)   REVIEW OF SYSTEMS:     ALLERGIES Allergies  Allergen Reactions   Tape Itching and Other (See Comments)    Causes a lot of scratching, also   PAST MEDICAL HISTORY Past Medical History:  Diagnosis Date   Asthma    CVA (cerebral vascular accident) (HCC)    2008 and 2020; left spastic hemiplegia   Dementia (HCC)    Diabetes mellitus without complication (HCC)    Past Surgical History:  Procedure Laterality Date   IR ANGIO INTRA EXTRACRAN SEL COM CAROTID INNOMINATE BILAT MOD SED  09/15/2019   IR ANGIO VERTEBRAL SEL VERTEBRAL UNI L MOD SED  09/15/2019   ORIF ANKLE FRACTURE Left 02/03/2022   Procedure: OPEN TREATMENT OF LEFT BIMALLEOLAR ANKLE FRACTURE;  Surgeon: Elsa,  Lonni SAUNDERS, MD;  Location: [facility] OR;  Service: Orthopedics;  Laterality: Left;  LENGTH OF SURGERY: 90 MINUTES POSITION: SUPINE   SYNDESMOSIS REPAIR Left 02/03/2022   Procedure: SYNDESMOSIS;  Surgeon: Elsa Lonni SAUNDERS, MD;  Location: [facility] OR;  Service: Orthopedics;  Laterality: Left;  LENGTH OF SURGERY: 90 MINUTES POSITION: SUPINE   FAMILY HISTORY Family History  Problem Relation Age of Onset   Hypertension Mother    Hypertension Father    SOCIAL HISTORY Social History   Tobacco Use   Smoking status: Former   Smokeless tobacco: Never   Tobacco comments:    QUIT IN 2008  Vaping Use   Vaping  status: Never Used  Substance Use Topics   Alcohol use: Yes    Comment: RARE   Drug use: No       OPHTHALMIC EXAM:  Not recorded     IMAGING AND PROCEDURES  Imaging and Procedures for 10/25/2024           ASSESSMENT/PLAN:    ICD-10-CM   1. Moderate nonproliferative diabetic retinopathy of both eyes with macular edema associated with type 2 diabetes mellitus (HCC)  Z88.6686     2. CME (cystoid macular edema), right  H35.351     3. Essential hypertension  I10     4. Hypertensive retinopathy of both eyes  H35.033     5. 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 Pseudophakia, both eyes  Z96.1       1. Moderate nonproliferative diabetic retinopathy OU - pt lost to follow up from 4 weeks to 6 months (03.22.23 - 09.22.23) - pt lost to follow up from 4 weeks to 19 months (09.22.23 - 05.07.25) - pt lost to folllow up from 4 weeks to 3 months (05.07.25-08.13.25)  - s/p IVA OD #1 (05.07.25), #2 (08.13.25), #3 (09.10.25), #4 (10.08.25), #5 (10.08.25) - s/p IVA OS #1 (09.22.23), #2 (05.07.25), #3 (08.13.25), #4 (09.10.25), #5 (10.08.25), #6 (10.08.25)  - +cystic changes/ DME OU - exam with scattered MA OU - FA (1.4.23) shows mild late leaking MA OU - OCT shows OD: Scattered persistent IRF / edema-- increased centrally, central SRF improved, partial PVD; OS: Interval improvement of vitreous opacities, persistent IRF / edema temporal fovea and macula--increased centrally 4 weeks - recommend IVA OU (OD #6 and OS #7) today, (12.03.25) w/ f/u in 4 wks  - pt wishes to proceed with injections  - RBA of procedure discussed, questions answered - IVA informed consent obtained and signed, 05.07.25 (OU) - see procedure note - f/u in 4 wks -- DFE/OCT  2. CME OD - OCT shows OD: Scattered persistent IRF / edema -- slightly improved; partial PVD  - BCVA OD 20/40--stable - FA (01.4.23) shows petaloid leakage consistent with CME OD + mild leaking MA OU  - okay  to stay off PF and Prolensa  for now  - recommend IVA OD -- see above  - f/u 4 wks for DFE, OCT  3,4. Hypertensive retinopathy OU - discussed importance of tight BP control - monitor  5. Mixed Cataract OD - The symptoms of cataract, surgical options, and treatments and risks were discussed with patient. - discussed diagnosis and progression - pt planning on getting [facility]established at [facility]  6. Pseudophakia OS  - s/p CE/IOL OS (Dr. KYM Gaudy)  - IOL in good position, doing well  - monitor   Ophthalmic Meds Ordered this visit:  No orders of the defined types were  placed in this encounter.    No follow-ups on file.  There are no Patient Instructions on file for this visit.   Explained the diagnoses, plan, and follow up with the patient and they expressed understanding.  Patient expressed understanding of the importance of proper follow up care.   This document serves as a record of services personally performed by Redell JUDITHANN Hans, MD, PhD. It was created on their behalf by Almetta Pesa, an ophthalmic technician. The creation of this record is the provider's dictation and/or activities during the visit.    Electronically signed by: Almetta Pesa, OA, 10/24/24  2:55 PM  This document serves as a record of services personally performed by Redell JUDITHANN Hans, MD, PhD. It was created on their behalf by Wanda GEANNIE Keens, COT an ophthalmic technician. The creation of this record is the provider's dictation and/or activities during the visit.    Electronically signed by:  Wanda GEANNIE Keens, COT  10/24/24 2:55 PM  Redell JUDITHANN Hans, M.D., Ph.D. Diseases & Surgery of the Retina and Vitreous [facility]   Abbreviations: M myopia (nearsighted); A astigmatism; H hyperopia (farsighted); P presbyopia; Mrx spectacle prescription;  CTL contact lenses; OD right eye; OS left eye; OU both eyes  XT exotropia; ET esotropia; PEK punctate epithelial keratitis; PEE  punctate epithelial erosions; DES dry eye syndrome; MGD meibomian gland dysfunction; ATs artificial tears; PFAT's preservative free artificial tears; NSC nuclear sclerotic cataract; PSC posterior subcapsular cataract; ERM epi-retinal membrane; PVD posterior vitreous detachment; RD retinal detachment; DM diabetes mellitus; DR diabetic retinopathy; NPDR non-proliferative diabetic retinopathy; PDR proliferative diabetic retinopathy; CSME clinically significant macular edema; DME diabetic macular edema; dbh dot blot hemorrhages; CWS cotton wool spot; POAG primary open angle glaucoma; C/D cup-to-disc ratio; HVF humphrey visual field; GVF goldmann visual field; OCT optical coherence tomography; IOP intraocular pressure; BRVO Branch retinal vein occlusion; CRVO central retinal vein occlusion; CRAO central retinal artery occlusion; BRAO branch retinal artery occlusion; RT retinal tear; SB scleral buckle; PPV pars plana vitrectomy; VH Vitreous hemorrhage; PRP panretinal laser photocoagulation; IVK intravitreal kenalog; VMT vitreomacular traction; MH Macular hole;  NVD neovascularization of the disc; NVE neovascularization elsewhere; AREDS age related eye disease study; ARMD age related macular degeneration; POAG primary open angle glaucoma; EBMD epithelial/anterior basement membrane dystrophy; ACIOL anterior chamber intraocular lens; IOL intraocular lens; PCIOL posterior chamber intraocular lens; Phaco/IOL phacoemulsification with intraocular lens placement; PRK photorefractive keratectomy; LASIK laser assisted in situ keratomileusis; HTN hypertension; DM diabetes mellitus; COPD chronic obstructive pulmonary disease

## 2024-10-25 ENCOUNTER — Encounter (INDEPENDENT_AMBULATORY_CARE_PROVIDER_SITE_OTHER): Payer: Medicare (Managed Care) | Admitting: Ophthalmology

## 2024-10-25 DIAGNOSIS — H25811 Combined forms of age-related cataract, right eye: Secondary | ICD-10-CM

## 2024-10-25 DIAGNOSIS — E113313 Type 2 diabetes mellitus with moderate nonproliferative diabetic retinopathy with macular edema, bilateral: Secondary | ICD-10-CM

## 2024-10-25 DIAGNOSIS — H35033 Hypertensive retinopathy, bilateral: Secondary | ICD-10-CM

## 2024-10-25 DIAGNOSIS — I10 Essential (primary) hypertension: Secondary | ICD-10-CM

## 2024-10-25 DIAGNOSIS — H35351 Cystoid macular degeneration, right eye: Secondary | ICD-10-CM

## 2024-10-25 DIAGNOSIS — Z961 Presence of intraocular lens: Secondary | ICD-10-CM

## 2024-10-30 NOTE — Progress Notes (Signed)
 [facility] - Clinic Note  11/01/2024     CHIEF COMPLAINT Patient presents for No chief complaint on file.    HISTORY OF PRESENT ILLNESS: Christina Evans is a 63 y.o. female who presents to the clinic today for:     Patient states her one daughter moved to [place]. She still lives with another daughter, [name]. Pt states her eyes are watering a lot. Pt is at a nursing home currently, will be moving home 11/26.   Referring physician: Cloria Annabella CROME, DO [address] [place],  [place] [address]  HISTORICAL INFORMATION:   Selected notes from the MEDICAL RECORD NUMBER Referred by Dr. Medford Gaudy for concern of cataract clearance / NPDR LEE:  Ocular Hx- PMH-    CURRENT MEDICATIONS: Current Outpatient Medications (Ophthalmic Drugs)  Medication Sig   Bromfenac  Sodium (PROLENSA ) 0.07 % SOLN Place 1 drop into both eyes 4 (four) times daily.   prednisoLONE  acetate (PRED FORTE ) 1 % ophthalmic suspension Place 1 drop into both eyes 4 (four) times daily.   No current facility-administered medications for this visit. (Ophthalmic Drugs)   Current Outpatient Medications (Other)  Medication Sig   acetaminophen  (TYLENOL ) 500 MG tablet Take 500 mg by mouth every 6 (six) hours as needed for moderate pain or mild pain.   albuterol  (PROAIR  HFA) 108 (90 Base) MCG/ACT inhaler Inhale 2 puffs into the lungs 4 (four) times daily as needed for wheezing or shortness of breath.   atorvastatin  (LIPITOR ) 80 MG tablet TAKE 1 TABLET BY MOUTH ONCE DAILY.   benzonatate  (TESSALON ) 100 MG capsule Take 1 capsule (100 mg total) by mouth 3 (three) times daily as needed for cough.   blood glucose meter kit and supplies Dispense based on patient and insurance preference. Use up to four times daily as directed. (FOR ICD-9 250.00, 250.01).   Cholecalciferol (VITAMIN D3) 50 MCG (2000 UT) TABS Take 2,000 Units by mouth daily.   citalopram  (CELEXA ) 40 MG tablet TAKE 1 TABLET BY MOUTH ONCE  DAILY.   clopidogrel  (PLAVIX ) 75 MG tablet Take 1 tablet (75 mg total) by mouth daily.   diclofenac Sodium (VOLTAREN) 1 % GEL Apply 2 g topically.   glucose blood test strip Use as instructed   Lancets (ACCU-CHEK SOFT TOUCH) lancets Use as instructed   melatonin 5 MG TABS Take 5 mg by mouth at bedtime.   metFORMIN  (GLUCOPHAGE -XR) 500 MG 24 hr tablet Take 2 tablets (1,000 mg total) by mouth 2 (two) times daily.   tamsulosin (FLOMAX) 0.4 MG CAPS capsule Take 0.4 mg by mouth daily.   trimethoprim (TRIMPEX) 100 MG tablet Take 100 mg by mouth daily.   No current facility-administered medications for this visit. (Other)   REVIEW OF SYSTEMS:     ALLERGIES Allergies  Allergen Reactions   Tape Itching and Other (See Comments)    Causes a lot of scratching, also   PAST MEDICAL HISTORY Past Medical History:  Diagnosis Date   Asthma    CVA (cerebral vascular accident) (HCC)    2008 and 2020; left spastic hemiplegia   Dementia (HCC)    Diabetes mellitus without complication (HCC)    Past Surgical History:  Procedure Laterality Date   IR ANGIO INTRA EXTRACRAN SEL COM CAROTID INNOMINATE BILAT MOD SED  09/15/2019   IR ANGIO VERTEBRAL SEL VERTEBRAL UNI L MOD SED  09/15/2019   ORIF ANKLE FRACTURE Left 02/03/2022   Procedure: OPEN TREATMENT OF LEFT BIMALLEOLAR ANKLE FRACTURE;  Surgeon: Elsa,  Lonni SAUNDERS, MD;  Location: [facility] OR;  Service: Orthopedics;  Laterality: Left;  LENGTH OF SURGERY: 90 MINUTES POSITION: SUPINE   SYNDESMOSIS REPAIR Left 02/03/2022   Procedure: SYNDESMOSIS;  Surgeon: Elsa Lonni SAUNDERS, MD;  Location: [facility] OR;  Service: Orthopedics;  Laterality: Left;  LENGTH OF SURGERY: 90 MINUTES POSITION: SUPINE   FAMILY HISTORY Family History  Problem Relation Age of Onset   Hypertension Mother    Hypertension Father    SOCIAL HISTORY Social History   Tobacco Use   Smoking status: Former   Smokeless tobacco: Never   Tobacco comments:    QUIT IN 2008  Vaping Use   Vaping  status: Never Used  Substance Use Topics   Alcohol use: Yes    Comment: RARE   Drug use: No       OPHTHALMIC EXAM:  Not recorded     IMAGING AND PROCEDURES  Imaging and Procedures for 11/01/2024           ASSESSMENT/PLAN:  No diagnosis found.  1. Moderate nonproliferative diabetic retinopathy OU - pt lost to follow up from 4 weeks to 6 months (03.22.23 - 09.22.23) - pt lost to follow up from 4 weeks to 19 months (09.22.23 - 05.07.25) - pt lost to folllow up from 4 weeks to 3 months (05.07.25-08.13.25)  - s/p IVA OD #1 (05.07.25), #2 (08.13.25), #3 (09.10.25), #4 (10.08.25) - s/p IVA OS #1 (09.22.23), #2 (05.07.25), #3 (08.13.25), #4 (09.10.25), #5 (10.08.25)  - +cystic changes/ DME OU - exam with scattered MA OU - FA (1.4.23) shows mild late leaking MA OU - OCT shows OD: Scattered persistent IRF / edema-- increased centrally, central SRF improved, partial PVD; OS: Interval improvement of vitreous opacities, persistent IRF / edema temporal fovea and macula--increased centrally 4 weeks - recommend IVA OU (OD #5 and OS #6) today, (12.10.25) w/ f/u in 4 wks  - pt wishes to proceed with injections  - RBA of procedure discussed, questions answered - IVA informed consent obtained and signed, 05.07.25 (OU) - see procedure note - f/u in 4 wks -- DFE/OCT  2. CME OD - OCT shows OD: Scattered persistent IRF / edema -- slightly improved; partial PVD  - BCVA OD 20/40--stable - FA (01.4.23) shows petaloid leakage consistent with CME OD + mild leaking MA OU  - okay to stay off PF and Prolensa  for now  - recommend IVA OD -- see above  - f/u 4 wks for DFE, OCT  3,4. Hypertensive retinopathy OU - discussed importance of tight BP control - monitor  5. Mixed Cataract OD - The symptoms of cataract, surgical options, and treatments and risks were discussed with patient. - discussed diagnosis and progression - pt planning on getting [facility] at [facility]  6.  Pseudophakia OS  - s/p CE/IOL OS (Dr. KYM Gaudy)  - IOL in good position, doing well  - monitor   Ophthalmic Meds Ordered this visit:  No orders of the defined types were placed in this encounter.    No follow-ups on file.  There are no Patient Instructions on file for this visit.   Explained the diagnoses, plan, and follow up with the patient and they expressed understanding.  Patient expressed understanding of the importance of proper follow up care.   This document serves as a record of services personally performed by Redell JUDITHANN Hans, MD, PhD. It was created on their behalf by Almetta Pesa, an ophthalmic technician. The creation of this record is the provider's dictation  and/or activities during the visit.    Electronically signed by: Almetta Pesa, OA, 10/30/24  12:20 PM     Redell JUDITHANN Hans, M.D., Ph.D. Diseases & Surgery of the Retina and Vitreous [facility]   Abbreviations: M myopia (nearsighted); A astigmatism; H hyperopia (farsighted); P presbyopia; Mrx spectacle prescription;  CTL contact lenses; OD right eye; OS left eye; OU both eyes  XT exotropia; ET esotropia; PEK punctate epithelial keratitis; PEE punctate epithelial erosions; DES dry eye syndrome; MGD meibomian gland dysfunction; ATs artificial tears; PFAT's preservative free artificial tears; NSC nuclear sclerotic cataract; PSC posterior subcapsular cataract; ERM epi-retinal membrane; PVD posterior vitreous detachment; RD retinal detachment; DM diabetes mellitus; DR diabetic retinopathy; NPDR non-proliferative diabetic retinopathy; PDR proliferative diabetic retinopathy; CSME clinically significant macular edema; DME diabetic macular edema; dbh dot blot hemorrhages; CWS cotton wool spot; POAG primary open angle glaucoma; C/D cup-to-disc ratio; HVF humphrey visual field; GVF goldmann visual field; OCT optical coherence tomography; IOP intraocular pressure; BRVO Branch retinal vein occlusion;  CRVO central retinal vein occlusion; CRAO central retinal artery occlusion; BRAO branch retinal artery occlusion; RT retinal tear; SB scleral buckle; PPV pars plana vitrectomy; VH Vitreous hemorrhage; PRP panretinal laser photocoagulation; IVK intravitreal kenalog; VMT vitreomacular traction; MH Macular hole;  NVD neovascularization of the disc; NVE neovascularization elsewhere; AREDS age related eye disease study; ARMD age related macular degeneration; POAG primary open angle glaucoma; EBMD epithelial/anterior basement membrane dystrophy; ACIOL anterior chamber intraocular lens; IOL intraocular lens; PCIOL posterior chamber intraocular lens; Phaco/IOL phacoemulsification with intraocular lens placement; PRK photorefractive keratectomy; LASIK laser assisted in situ keratomileusis; HTN hypertension; DM diabetes mellitus; COPD chronic obstructive pulmonary disease

## 2024-11-01 ENCOUNTER — Encounter (INDEPENDENT_AMBULATORY_CARE_PROVIDER_SITE_OTHER): Payer: Self-pay | Admitting: Ophthalmology

## 2024-11-01 ENCOUNTER — Ambulatory Visit (INDEPENDENT_AMBULATORY_CARE_PROVIDER_SITE_OTHER): Payer: Medicare (Managed Care) | Admitting: Ophthalmology

## 2024-11-01 DIAGNOSIS — H35351 Cystoid macular degeneration, right eye: Secondary | ICD-10-CM

## 2024-11-01 DIAGNOSIS — E113313 Type 2 diabetes mellitus with moderate nonproliferative diabetic retinopathy with macular edema, bilateral: Secondary | ICD-10-CM | POA: Diagnosis not present

## 2024-11-01 DIAGNOSIS — Z961 Presence of intraocular lens: Secondary | ICD-10-CM

## 2024-11-01 DIAGNOSIS — I10 Essential (primary) hypertension: Secondary | ICD-10-CM

## 2024-11-01 DIAGNOSIS — H35033 Hypertensive retinopathy, bilateral: Secondary | ICD-10-CM

## 2024-11-01 DIAGNOSIS — H25811 Combined forms of age-related cataract, right eye: Secondary | ICD-10-CM

## 2024-11-01 MED ORDER — BEVACIZUMAB CHEMO INJECTION 1.25MG/0.05ML SYRINGE FOR KALEIDOSCOPE
1.2500 mg | INTRAVITREAL | Status: AC | PRN
  Administered 2024-11-01: 1.25 mg via INTRAVITREAL

## 2024-11-30 NOTE — Progress Notes (Signed)
 " [facility] - Clinic Note  12/06/2024     CHIEF COMPLAINT Patient presents for Retina Follow Up    HISTORY OF PRESENT ILLNESS: Christina Evans is a 64 y.o. female who presents to the clinic today for:   HPI     Retina Follow Up   Patient presents with  Diabetic Retinopathy.  In both eyes.  This started 5 weeks ago.  Duration of 5 weeks.  Since onset it is stable.  I, the attending physician,  performed the HPI with the patient and updated documentation appropriately.        Comments   5 week retina follow up NPDR OU and IVA OU pt is reporting vision is blurred in OS and can see little better in OD she denies any flashes or floater her last reading was 300  range today       Last edited by Valdemar Rogue, MD on 12/06/2024 10:46 PM.    Pt states she can see better OU   Referring physician: Cloria Annabella CROME, DO [address] [place],  [place] [address]  HISTORICAL INFORMATION:   Selected notes from the MEDICAL RECORD NUMBER Referred by Dr. Medford Gaudy for concern of cataract clearance / NPDR LEE:  Ocular Hx- PMH-    CURRENT MEDICATIONS: Current Outpatient Medications (Ophthalmic Drugs)  Medication Sig   Bromfenac  Sodium (PROLENSA ) 0.07 % SOLN Place 1 drop into both eyes 4 (four) times daily.   prednisoLONE  acetate (PRED FORTE ) 1 % ophthalmic suspension Place 1 drop into both eyes 4 (four) times daily.   No current facility-administered medications for this visit. (Ophthalmic Drugs)   Current Outpatient Medications (Other)  Medication Sig   acetaminophen  (TYLENOL ) 500 MG tablet Take 500 mg by mouth every 6 (six) hours as needed for moderate pain or mild pain.   albuterol  (PROAIR  HFA) 108 (90 Base) MCG/ACT inhaler Inhale 2 puffs into the lungs 4 (four) times daily as needed for wheezing or shortness of breath.   atorvastatin  (LIPITOR ) 80 MG tablet TAKE 1 TABLET BY MOUTH ONCE DAILY.   benzonatate  (TESSALON ) 100 MG capsule Take 1 capsule (100 mg  total) by mouth 3 (three) times daily as needed for cough.   blood glucose meter kit and supplies Dispense based on patient and insurance preference. Use up to four times daily as directed. (FOR ICD-9 250.00, 250.01).   Cholecalciferol (VITAMIN D3) 50 MCG (2000 UT) TABS Take 2,000 Units by mouth daily.   citalopram  (CELEXA ) 40 MG tablet TAKE 1 TABLET BY MOUTH ONCE DAILY.   clopidogrel  (PLAVIX ) 75 MG tablet Take 1 tablet (75 mg total) by mouth daily.   diclofenac Sodium (VOLTAREN) 1 % GEL Apply 2 g topically.   glucose blood test strip Use as instructed   Lancets (ACCU-CHEK SOFT TOUCH) lancets Use as instructed   melatonin 5 MG TABS Take 5 mg by mouth at bedtime.   metFORMIN  (GLUCOPHAGE -XR) 500 MG 24 hr tablet Take 2 tablets (1,000 mg total) by mouth 2 (two) times daily.   tamsulosin (FLOMAX) 0.4 MG CAPS capsule Take 0.4 mg by mouth daily.   trimethoprim (TRIMPEX) 100 MG tablet Take 100 mg by mouth daily.   No current facility-administered medications for this visit. (Other)   REVIEW OF SYSTEMS: ROS   Positive for: Neurological, Musculoskeletal, Endocrine, Eyes, Respiratory, Psychiatric Negative for: Constitutional, Gastrointestinal, Skin, Genitourinary, HENT, Cardiovascular, Allergic/Imm, Heme/Lymph Last edited by Resa Delon ORN, COT on 12/06/2024  1:33 PM.     ALLERGIES  Allergies  Allergen Reactions   Tape Itching and Other (See Comments)    Causes a lot of scratching, also   PAST MEDICAL HISTORY Past Medical History:  Diagnosis Date   Asthma    CVA (cerebral vascular accident) (HCC)    2008 and 2020; left spastic hemiplegia   Dementia (HCC)    Diabetes mellitus without complication (HCC)    Past Surgical History:  Procedure Laterality Date   IR ANGIO INTRA EXTRACRAN SEL COM CAROTID INNOMINATE BILAT MOD SED  09/15/2019   IR ANGIO VERTEBRAL SEL VERTEBRAL UNI L MOD SED  09/15/2019   ORIF ANKLE FRACTURE Left 02/03/2022   Procedure: OPEN TREATMENT OF LEFT BIMALLEOLAR  ANKLE FRACTURE;  Surgeon: Elsa Lonni SAUNDERS, MD;  [facility];  Service: Orthopedics;  Laterality: Left;  LENGTH OF SURGERY: 90 MINUTES POSITION: SUPINE   SYNDESMOSIS REPAIR Left 02/03/2022   Procedure: SYNDESMOSIS;  Surgeon: Elsa Lonni SAUNDERS, MD;  Location: [facility] OR;  Service: Orthopedics;  Laterality: Left;  LENGTH OF SURGERY: 90 MINUTES POSITION: SUPINE   FAMILY HISTORY Family History  Problem Relation Age of Onset   Hypertension Mother    Hypertension Father    SOCIAL HISTORY Social History   Tobacco Use   Smoking status: Former   Smokeless tobacco: Never   Tobacco comments:    QUIT IN 2008  Vaping Use   Vaping status: Never Used  Substance Use Topics   Alcohol use: Yes    Comment: RARE   Drug use: No       OPHTHALMIC EXAM:  Base Eye Exam     Visual Acuity (Snellen - Linear)       Right Left   Dist [place] 20/50 -2 20/50 -1   Dist ph [place] NI NI         Tonometry (Tonopen, 1:36 PM)       Right Left   Pressure 18 18         Pupils       Pupils Dark Light Shape React APD   Right PERRL 3 2 Round Brisk None   Left PERRL 3 2 Round Brisk None         Visual Fields       Left Right    Full Full         Extraocular Movement       Right Left    Full, Ortho Full, Ortho         Neuro/Psych     Oriented x3: Yes   Mood/Affect: Normal         Dilation     Both eyes: 2.5% Phenylephrine  @ 1:36 PM           Slit Lamp and Fundus Exam     Slit Lamp Exam       Right Left   Lids/Lashes Dermatochalasis - upper lid, mild MGD Dermatochalasis - upper lid, mild MGD   Conjunctiva/Sclera temporal pinguecula nasal and temporal pinguecula   Cornea Clear well healed cataract wound   Anterior Chamber deep and clear deep and clear   Iris Round and dilated, No NVI Round and dilated, No NVI   Lens 2+ Nuclear sclerosis, 3+ Cortical cataract, 2+Posterior subcapsular cataract PC IOL in good position   Anterior Vitreous Vitreous syneresis Vitreous  syneresis, blood stained Vitreous condensations--now white and settled inferiorly         Fundus Exam       Right Left   Disc Trace Pallor, Sharp rim Pink and  Sharp, mild PPA   C/D Ratio 0.6 0.5   Macula Flat, Blunted foveal reflex, scattered MA and cystic changes--slightly improved blunted foveal reflex, scattered MA/ DBH, cystic changes / edema temporal macula--slightly improved, +CWS- stably improved   Vessels attenuated, mild tortuosity attenuated, Tortuous   Periphery Attached, scattered MA Attached, scattered MA, mild pre retinal hemes, Retinal detachment            IMAGING AND PROCEDURES  Imaging and Procedures for 12/06/2024  OCT, Retina - OU - Both Eyes       Right Eye Quality was good. Central Foveal Thickness: 415. Progression has improved. Findings include no SRF, abnormal foveal contour, intraretinal hyper-reflective material, intraretinal fluid, vitreomacular adhesion (Scattered persistent IRF / edema-- slightly improved, central SRF stably improved).   Left Eye Quality was good. Central Foveal Thickness: 387. Progression has improved. Findings include no SRF, abnormal foveal contour, retinal drusen , intraretinal hyper-reflective material, intraretinal fluid (Persistent vitreous opacities, persistent IRF / edema temporal fovea and macula--slightly improved ).   Notes *Images captured and stored on drive  Diagnosis / Impression:  OD: Scattered persistent IRF / edema--slightly improved, central SRF stably improved OS: Persistent vitreous opacities, persistent IRF / edema temporal fovea and macula--slightly improved   Clinical management:  See below  Abbreviations: NFP - Normal foveal profile. CME - cystoid macular edema. PED - pigment epithelial detachment. IRF - intraretinal fluid. SRF - subretinal fluid. EZ - ellipsoid zone. ERM - epiretinal membrane. ORA - outer retinal atrophy. ORT - outer retinal tubulation. SRHM - subretinal hyper-reflective material. IRHM  - intraretinal hyper-reflective material      Intravitreal Injection, Pharmacologic Agent - OD - Right Eye       Time Out 12/06/2024. 2:24 PM. Confirmed correct patient, procedure, site, and patient consented.   Anesthesia Topical anesthesia was used. Anesthetic medications included Lidocaine  2%, Proparacaine 0.5%.   Procedure Preparation included 5% betadine to ocular surface, eyelid speculum. A (32g) needle was used.   Injection: 1.25 mg Bevacizumab  1.25mg /0.20ml   Route: Intravitreal, Site: Right Eye   NDC: H525437, Lot: [id number], Expiration date: 02/08/2025   Post-op Post injection exam found visual acuity of at least counting fingers. The patient tolerated the procedure well. There were no complications. The patient received written and verbal post procedure care education.      Intravitreal Injection, Pharmacologic Agent - OS - Left Eye       Time Out 12/06/2024. 2:24 PM. Confirmed correct patient, procedure, site, and patient consented.   Anesthesia Topical anesthesia was used. Anesthetic medications included Lidocaine  2%, Proparacaine 0.5%.   Procedure Preparation included 5% betadine to ocular surface, eyelid speculum. A (32g) needle was used.   Injection: 1.25 mg Bevacizumab  1.25mg /0.56ml   Route: Intravitreal, Site: Left Eye   NDC: H525437, Lot: [id number]$ , Expiration date: 12/16/2024   Post-op Post injection exam found visual acuity of at least counting fingers. The patient tolerated the procedure well. There were no complications. The patient received written and verbal post procedure care education.            ASSESSMENT/PLAN:    ICD-10-CM   1. Moderate nonproliferative diabetic retinopathy of both eyes with macular edema associated with type 2 diabetes mellitus (HCC)  E11.3313 OCT, Retina - OU - Both Eyes    Intravitreal Injection, Pharmacologic Agent - OD - Right Eye    Intravitreal Injection, Pharmacologic Agent - OS - Left Eye     Bevacizumab  (AVASTIN ) SOLN 1.25 mg    Bevacizumab  (AVASTIN ) SOLN 1.25  mg    2. CME (cystoid macular edema), right  H35.351     3. Essential hypertension  I10     4. Hypertensive retinopathy of both eyes  H35.033     5. 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 Pseudophakia of left eye  Z96.1      1. Moderate nonproliferative diabetic retinopathy OU - pt lost to follow up from 4 weeks to 6 months (03.22.23 - 09.22.23) - pt lost to follow up from 4 weeks to 19 months (09.22.23 - 05.07.25) - pt lost to folllow up from 4 weeks to 3 months (05.07.25-08.13.25)  - s/p IVA OD #1 (05.07.25), #2 (08.13.25), #3 (09.10.25), #4 (10.08.25), #5 (11.05.25), #6 (12.10.25) - s/p IVA OS #1 (09.22.23), #2 (05.07.25), #3 (08.13.25), #4 (09.10.25), #5 (10.08.25), #6 (11.05.25), #7 (12.10.25)  - +cystic changes/ DME OU - exam with scattered MA OU - FA (1.4.23) shows mild late leaking MA OU - BCVA 20/50 OU - stable - OCT shows OD: Scattered persistent IRF / edema-- slightly improved, central SRF stably improved; OS: Persistent vitreous opacities, persistent IRF / edema temporal fovea and macula--slightly improved 5 weeks - recommend IVA OU (OD #7, OS #8) today, (01.14.26) w/ f/u in 4-5 wks  - pt wishes to proceed with injections  - RBA of procedure discussed, questions answered - IVA informed consent obtained and signed, 05.07.25 (OU) - see procedure note - f/u in 4-5 wks -- DFE/OCT  2. CME OD - OCT shows OD: Scattered persistent IRF / edema -- slightly improved; partial PVD  - BCVA OD 20/40--stable - FA (01.4.23) shows petaloid leakage consistent with CME OD + mild leaking MA OU  - okay to stay off PF and Prolensa  for now  - recommend IVA OD -- see above  - f/u 4 wks for DFE, OCT  3,4. Hypertensive retinopathy OU - discussed importance of tight BP control - monitor  5. Mixed Cataract OD - The symptoms of cataract, surgical options, and treatments and risks were  discussed with patient. - discussed diagnosis and progression - pt planning on getting re-established at [facility]  6. Pseudophakia OS  - s/p CE/IOL OS (Dr. KYM Gaudy)  - IOL in good position, doing well  - monitor   Ophthalmic Meds Ordered this visit:  Meds ordered this encounter  Medications   Bevacizumab  (AVASTIN ) SOLN 1.25 mg   Bevacizumab  (AVASTIN ) SOLN 1.25 mg     Return for 4-5 weeks NPDR OU, DFE, OCT, likely IVA OU.  There are no Patient Instructions on file for this visit.   Explained the diagnoses, plan, and follow up with the patient and they expressed understanding.  Patient expressed understanding of the importance of proper follow up care.   This document serves as a record of services personally performed by Redell JUDITHANN Hans, MD, PhD. It was created on their behalf by Almetta Pesa, an ophthalmic technician. The creation of this record is the provider's dictation and/or activities during the visit.    Electronically signed by: Almetta Pesa, OA, 12/06/24  10:50 PM   Redell JUDITHANN Hans, M.D., Ph.D. Diseases & Surgery of the Retina and Vitreous [facility]  I have reviewed the above documentation for accuracy and completeness, and I agree with the above. Redell JUDITHANN Hans, M.D., Ph.D. 12/06/24 10:52 PM   Abbreviations: M myopia (nearsighted); A astigmatism; H hyperopia (farsighted); P presbyopia; Mrx spectacle prescription;  CTL contact lenses; OD  right eye; OS left eye; OU both eyes  XT exotropia; ET esotropia; PEK punctate epithelial keratitis; PEE punctate epithelial erosions; DES dry eye syndrome; MGD meibomian gland dysfunction; ATs artificial tears; PFAT's preservative free artificial tears; NSC nuclear sclerotic cataract; PSC posterior subcapsular cataract; ERM epi-retinal membrane; PVD posterior vitreous detachment; RD retinal detachment; DM diabetes mellitus; DR diabetic retinopathy; NPDR non-proliferative diabetic retinopathy; PDR  proliferative diabetic retinopathy; CSME clinically significant macular edema; DME diabetic macular edema; dbh dot blot hemorrhages; CWS cotton wool spot; POAG primary open angle glaucoma; C/D cup-to-disc ratio; HVF humphrey visual field; GVF goldmann visual field; OCT optical coherence tomography; IOP intraocular pressure; BRVO Branch retinal vein occlusion; CRVO central retinal vein occlusion; CRAO central retinal artery occlusion; BRAO branch retinal artery occlusion; RT retinal tear; SB scleral buckle; PPV pars plana vitrectomy; VH Vitreous hemorrhage; PRP panretinal laser photocoagulation; IVK intravitreal kenalog; VMT vitreomacular traction; MH Macular hole;  NVD neovascularization of the disc; NVE neovascularization elsewhere; AREDS age related eye disease study; ARMD age related macular degeneration; POAG primary open angle glaucoma; EBMD epithelial/anterior basement membrane dystrophy; ACIOL anterior chamber intraocular lens; IOL intraocular lens; PCIOL posterior chamber intraocular lens; Phaco/IOL phacoemulsification with intraocular lens placement; PRK photorefractive keratectomy; LASIK laser assisted in situ keratomileusis; HTN hypertension; DM diabetes mellitus; COPD chronic obstructive pulmonary disease "

## 2024-12-06 ENCOUNTER — Encounter (INDEPENDENT_AMBULATORY_CARE_PROVIDER_SITE_OTHER): Payer: Self-pay | Admitting: Ophthalmology

## 2024-12-06 ENCOUNTER — Ambulatory Visit (INDEPENDENT_AMBULATORY_CARE_PROVIDER_SITE_OTHER): Payer: Medicare (Managed Care) | Admitting: Ophthalmology

## 2024-12-06 DIAGNOSIS — H35351 Cystoid macular degeneration, right eye: Secondary | ICD-10-CM

## 2024-12-06 DIAGNOSIS — H35033 Hypertensive retinopathy, bilateral: Secondary | ICD-10-CM | POA: Diagnosis not present

## 2024-12-06 DIAGNOSIS — I10 Essential (primary) hypertension: Secondary | ICD-10-CM

## 2024-12-06 DIAGNOSIS — Z961 Presence of intraocular lens: Secondary | ICD-10-CM

## 2024-12-06 DIAGNOSIS — H25811 Combined forms of age-related cataract, right eye: Secondary | ICD-10-CM

## 2024-12-06 DIAGNOSIS — E113313 Type 2 diabetes mellitus with moderate nonproliferative diabetic retinopathy with macular edema, bilateral: Secondary | ICD-10-CM

## 2024-12-06 MED ORDER — BEVACIZUMAB CHEMO INJECTION 1.25MG/0.05ML SYRINGE FOR KALEIDOSCOPE
1.2500 mg | INTRAVITREAL | Status: AC | PRN
  Administered 2024-12-06: 1.25 mg via INTRAVITREAL

## 2024-12-27 NOTE — Progress Notes (Shared)
 " [facility] - Clinic Note  01/03/2025     CHIEF COMPLAINT Patient presents for No chief complaint on file.    HISTORY OF PRESENT ILLNESS: Christina Evans is a 64 y.o. female who presents to the clinic today for:    Pt states she can see better OU   Referring physician: Cloria Riggs L, DO [address] [place],  [place] [address]  HISTORICAL INFORMATION:   Selected notes from the MEDICAL RECORD NUMBER Referred by Dr. Medford Gaudy for concern of cataract clearance / NPDR LEE:  Ocular Hx- PMH-    CURRENT MEDICATIONS: Current Outpatient Medications (Ophthalmic Drugs)  Medication Sig   Bromfenac  Sodium (PROLENSA ) 0.07 % SOLN Place 1 drop into both eyes 4 (four) times daily.   prednisoLONE  acetate (PRED FORTE ) 1 % ophthalmic suspension Place 1 drop into both eyes 4 (four) times daily.   No current facility-administered medications for this visit. (Ophthalmic Drugs)   Current Outpatient Medications (Other)  Medication Sig   acetaminophen  (TYLENOL ) 500 MG tablet Take 500 mg by mouth every 6 (six) hours as needed for moderate pain or mild pain.   albuterol  (PROAIR  HFA) 108 (90 Base) MCG/ACT inhaler Inhale 2 puffs into the lungs 4 (four) times daily as needed for wheezing or shortness of breath.   atorvastatin  (LIPITOR ) 80 MG tablet TAKE 1 TABLET BY MOUTH ONCE DAILY.   benzonatate  (TESSALON ) 100 MG capsule Take 1 capsule (100 mg total) by mouth 3 (three) times daily as needed for cough.   blood glucose meter kit and supplies Dispense based on patient and insurance preference. Use up to four times daily as directed. (FOR ICD-9 250.00, 250.01).   Cholecalciferol (VITAMIN D3) 50 MCG (2000 UT) TABS Take 2,000 Units by mouth daily.   citalopram  (CELEXA ) 40 MG tablet TAKE 1 TABLET BY MOUTH ONCE DAILY.   clopidogrel  (PLAVIX ) 75 MG tablet Take 1 tablet (75 mg total) by mouth daily.   diclofenac Sodium (VOLTAREN) 1 % GEL Apply 2 g topically.   glucose blood test  strip Use as instructed   Lancets (ACCU-CHEK SOFT TOUCH) lancets Use as instructed   melatonin 5 MG TABS Take 5 mg by mouth at bedtime.   metFORMIN  (GLUCOPHAGE -XR) 500 MG 24 hr tablet Take 2 tablets (1,000 mg total) by mouth 2 (two) times daily.   tamsulosin (FLOMAX) 0.4 MG CAPS capsule Take 0.4 mg by mouth daily.   trimethoprim (TRIMPEX) 100 MG tablet Take 100 mg by mouth daily.   No current facility-administered medications for this visit. (Other)   REVIEW OF SYSTEMS:   ALLERGIES Allergies  Allergen Reactions   Tape Itching and Other (See Comments)    Causes a lot of scratching, also   PAST MEDICAL HISTORY Past Medical History:  Diagnosis Date   Asthma    CVA (cerebral vascular accident) (HCC)    2008 and 2020; left spastic hemiplegia   Dementia (HCC)    Diabetes mellitus without complication (HCC)    Past Surgical History:  Procedure Laterality Date   IR ANGIO INTRA EXTRACRAN SEL COM CAROTID INNOMINATE BILAT MOD SED  09/15/2019   IR ANGIO VERTEBRAL SEL VERTEBRAL UNI L MOD SED  09/15/2019   ORIF ANKLE FRACTURE Left 02/03/2022   Procedure: OPEN TREATMENT OF LEFT BIMALLEOLAR ANKLE FRACTURE;  Surgeon: Elsa Lonni SAUNDERS, MD;  Location[facility] OR;  Service: Orthopedics;  Laterality: Left;  LENGTH OF SURGERY: 90 MINUTES POSITION: SUPINE   SYNDESMOSIS REPAIR Left 02/03/2022   Procedure: SYNDESMOSIS;  Surgeon: Elsa Lonni SAUNDERS, MD;  Location: [facility] OR;  Service: Orthopedics;  Laterality: Left;  LENGTH OF SURGERY: 90 MINUTES POSITION: SUPINE   FAMILY HISTORY Family History  Problem Relation Age of Onset   Hypertension Mother    Hypertension Father    SOCIAL HISTORY Social History   Tobacco Use   Smoking status: Former   Smokeless tobacco: Never   Tobacco comments:    QUIT IN 2008  Vaping Use   Vaping status: Never Used  Substance Use Topics   Alcohol use: Yes    Comment: RARE   Drug use: No       OPHTHALMIC EXAM:  Not recorded     IMAGING AND PROCEDURES   Imaging and Procedures for 01/03/2025          ASSESSMENT/PLAN:  No diagnosis found.  1. Moderate nonproliferative diabetic retinopathy OU - pt lost to follow up from 4 weeks to 6 months (03.22.23 - 09.22.23) - pt lost to follow up from 4 weeks to 19 months (09.22.23 - 05.07.25) - pt lost to folllow up from 4 weeks to 3 months (05.07.25-08.13.25)  - s/p IVA OD #1 (05.07.25), #2 (08.13.25), #3 (09.10.25), #4 (10.08.25), #5 (11.05.25), #6 (12.10.25), #7 (01.14.26) - s/p IVA OS #1 (09.22.23), #2 (05.07.25), #3 (08.13.25), #4 (09.10.25), #5 (10.08.25), #6 (11.05.25), #7 (12.10.25), #8 (01.14.26)  - +cystic changes/ DME OU - exam with scattered MA OU - FA (1.4.23) shows mild late leaking MA OU - BCVA 20/50 OU - stable - OCT shows OD: Scattered persistent IRF / edema-- slightly improved, central SRF stably improved; OS: Persistent vitreous opacities, persistent IRF / edema temporal fovea and macula--slightly improved 5 weeks - recommend IVA OU (OD #8, OS #9) today, (02.11.26) w/ f/u in 4-5 wks  - pt wishes to proceed with injections  - RBA of procedure discussed, questions answered - IVA informed consent obtained and signed, 05.07.25 (OU) - see procedure note - f/u in 4-5 wks -- DFE/OCT  2. CME OD - OCT shows OD: Scattered persistent IRF / edema -- slightly improved; partial PVD  - BCVA OD 20/40--stable - FA (01.4.23) shows petaloid leakage consistent with CME OD + mild leaking MA OU  - okay to stay off PF and Prolensa  for now  - recommend IVA OD -- see above  - f/u 4 wks for DFE, OCT  3,4. Hypertensive retinopathy OU - discussed importance of tight BP control - monitor  5. Mixed Cataract OD - The symptoms of cataract, surgical options, and treatments and risks were discussed with patient. - discussed diagnosis and progression - pt planning on getting re-established at [facility]  6. Pseudophakia OS  - s/p CE/IOL OS (Dr. KYM Gaudy)  - IOL in good position, doing well  -  monitor   Ophthalmic Meds Ordered this visit:  No orders of the defined types were placed in this encounter.    No follow-ups on file.  There are no Patient Instructions on file for this visit.   Explained the diagnoses, plan, and follow up with the patient and they expressed understanding.  Patient expressed understanding of the importance of proper follow up care.   This document serves as a record of services personally performed by Redell JUDITHANN Hans, MD, PhD. It was created on their behalf by Almetta Pesa, an ophthalmic technician. The creation of this record is the provider's dictation and/or activities during the visit.    Electronically signed by: Almetta Pesa, OA, 12/27/24  9:19 AM  Redell JUDITHANN Hans, M.D., Ph.D. Diseases & Surgery of the Retina and Vitreous [facility]   Abbreviations: M myopia (nearsighted); A astigmatism; H hyperopia (farsighted); P presbyopia; Mrx spectacle prescription;  CTL contact lenses; OD right eye; OS left eye; OU both eyes  XT exotropia; ET esotropia; PEK punctate epithelial keratitis; PEE punctate epithelial erosions; DES dry eye syndrome; MGD meibomian gland dysfunction; ATs artificial tears; PFAT's preservative free artificial tears; NSC nuclear sclerotic cataract; PSC posterior subcapsular cataract; ERM epi-retinal membrane; PVD posterior vitreous detachment; RD retinal detachment; DM diabetes mellitus; DR diabetic retinopathy; NPDR non-proliferative diabetic retinopathy; PDR proliferative diabetic retinopathy; CSME clinically significant macular edema; DME diabetic macular edema; dbh dot blot hemorrhages; CWS cotton wool spot; POAG primary open angle glaucoma; C/D cup-to-disc ratio; HVF humphrey visual field; GVF goldmann visual field; OCT optical coherence tomography; IOP intraocular pressure; BRVO Branch retinal vein occlusion; CRVO central retinal vein occlusion; CRAO central retinal artery occlusion; BRAO branch  retinal artery occlusion; RT retinal tear; SB scleral buckle; PPV pars plana vitrectomy; VH Vitreous hemorrhage; PRP panretinal laser photocoagulation; IVK intravitreal kenalog; VMT vitreomacular traction; MH Macular hole;  NVD neovascularization of the disc; NVE neovascularization elsewhere; AREDS age related eye disease study; ARMD age related macular degeneration; POAG primary open angle glaucoma; EBMD epithelial/anterior basement membrane dystrophy; ACIOL anterior chamber intraocular lens; IOL intraocular lens; PCIOL posterior chamber intraocular lens; Phaco/IOL phacoemulsification with intraocular lens placement; PRK photorefractive keratectomy; LASIK laser assisted in situ keratomileusis; HTN hypertension; DM diabetes mellitus; COPD chronic obstructive pulmonary disease "

## 2025-01-03 ENCOUNTER — Encounter (INDEPENDENT_AMBULATORY_CARE_PROVIDER_SITE_OTHER): Payer: Medicare (Managed Care) | Admitting: Ophthalmology

## 2025-01-03 DIAGNOSIS — H35033 Hypertensive retinopathy, bilateral: Secondary | ICD-10-CM

## 2025-01-03 DIAGNOSIS — H35351 Cystoid macular degeneration, right eye: Secondary | ICD-10-CM

## 2025-01-03 DIAGNOSIS — Z961 Presence of intraocular lens: Secondary | ICD-10-CM

## 2025-01-03 DIAGNOSIS — I10 Essential (primary) hypertension: Secondary | ICD-10-CM

## 2025-01-03 DIAGNOSIS — E113313 Type 2 diabetes mellitus with moderate nonproliferative diabetic retinopathy with macular edema, bilateral: Secondary | ICD-10-CM

## 2025-01-03 DIAGNOSIS — H25811 Combined forms of age-related cataract, right eye: Secondary | ICD-10-CM
